# Patient Record
Sex: Female | Born: 1960 | Race: White | Hispanic: No | Marital: Married | Smoking: Never smoker
Health system: Southern US, Community
[De-identification: ages and names within clinical notes are randomized; demographics above are authoritative.]

## PROBLEM LIST (undated history)

## (undated) DIAGNOSIS — Z9889 Other specified postprocedural states: Secondary | ICD-10-CM

## (undated) DIAGNOSIS — R112 Nausea with vomiting, unspecified: Secondary | ICD-10-CM

## (undated) DIAGNOSIS — E0590 Thyrotoxicosis, unspecified without thyrotoxic crisis or storm: Secondary | ICD-10-CM

## (undated) DIAGNOSIS — F419 Anxiety disorder, unspecified: Secondary | ICD-10-CM

## (undated) DIAGNOSIS — E010 Iodine-deficiency related diffuse (endemic) goiter: Secondary | ICD-10-CM

## (undated) DIAGNOSIS — C50919 Malignant neoplasm of unspecified site of unspecified female breast: Secondary | ICD-10-CM

## (undated) DIAGNOSIS — Z87442 Personal history of urinary calculi: Secondary | ICD-10-CM

## (undated) HISTORY — PX: URETERAL EXPLORATION: SHX2609

## (undated) HISTORY — PX: ABDOMINAL WALL DEFECT REPAIR: SHX53

## (undated) HISTORY — PX: BOWEL RESECTION: SHX1257

## (undated) HISTORY — PX: ABDOMINOPLASTY: SUR9

## (undated) HISTORY — PX: APPENDECTOMY: SHX54

## (undated) HISTORY — PX: HERNIA REPAIR: SHX51

## (undated) HISTORY — PX: DILATION AND CURETTAGE OF UTERUS: SHX78

## (undated) HISTORY — PX: ROOT CANAL: SHX2363

---
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
## 2002-10-05 ENCOUNTER — Encounter: Admission: RE | Admit: 2002-10-05 | Discharge: 2002-10-05 | Payer: Self-pay | Admitting: Internal Medicine

## 2016-03-28 DIAGNOSIS — E0590 Thyrotoxicosis, unspecified without thyrotoxic crisis or storm: Secondary | ICD-10-CM | POA: Insufficient documentation

## 2017-03-04 DIAGNOSIS — Z30432 Encounter for removal of intrauterine contraceptive device: Secondary | ICD-10-CM | POA: Diagnosis not present

## 2017-04-25 DIAGNOSIS — E010 Iodine-deficiency related diffuse (endemic) goiter: Secondary | ICD-10-CM | POA: Diagnosis not present

## 2017-04-25 DIAGNOSIS — E0590 Thyrotoxicosis, unspecified without thyrotoxic crisis or storm: Secondary | ICD-10-CM | POA: Diagnosis not present

## 2017-04-25 DIAGNOSIS — Z23 Encounter for immunization: Secondary | ICD-10-CM | POA: Diagnosis not present

## 2017-05-02 DIAGNOSIS — Z803 Family history of malignant neoplasm of breast: Secondary | ICD-10-CM | POA: Diagnosis not present

## 2017-05-02 DIAGNOSIS — Z1231 Encounter for screening mammogram for malignant neoplasm of breast: Secondary | ICD-10-CM | POA: Diagnosis not present

## 2017-07-04 ENCOUNTER — Other Ambulatory Visit: Payer: Self-pay | Admitting: Radiology

## 2017-07-04 DIAGNOSIS — N6320 Unspecified lump in the left breast, unspecified quadrant: Secondary | ICD-10-CM

## 2017-07-09 ENCOUNTER — Ambulatory Visit
Admission: RE | Admit: 2017-07-09 | Discharge: 2017-07-09 | Disposition: A | Discharge status: Home / Self Care | Payer: BLUE CROSS/BLUE SHIELD | Source: Ambulatory Visit | Attending: Radiology | Admitting: Radiology

## 2017-07-09 DIAGNOSIS — N6320 Unspecified lump in the left breast, unspecified quadrant: Secondary | ICD-10-CM

## 2017-07-09 DIAGNOSIS — R922 Inconclusive mammogram: Secondary | ICD-10-CM | POA: Diagnosis not present

## 2017-07-09 DIAGNOSIS — N6489 Other specified disorders of breast: Secondary | ICD-10-CM | POA: Diagnosis not present

## 2017-09-30 DIAGNOSIS — B354 Tinea corporis: Secondary | ICD-10-CM | POA: Diagnosis not present

## 2017-10-29 DIAGNOSIS — E0590 Thyrotoxicosis, unspecified without thyrotoxic crisis or storm: Secondary | ICD-10-CM | POA: Diagnosis not present

## 2017-11-04 DIAGNOSIS — Z6822 Body mass index (BMI) 22.0-22.9, adult: Secondary | ICD-10-CM | POA: Diagnosis not present

## 2017-11-04 DIAGNOSIS — Z113 Encounter for screening for infections with a predominantly sexual mode of transmission: Secondary | ICD-10-CM | POA: Diagnosis not present

## 2017-11-04 DIAGNOSIS — Z01419 Encounter for gynecological examination (general) (routine) without abnormal findings: Secondary | ICD-10-CM | POA: Diagnosis not present

## 2017-11-12 DIAGNOSIS — Z1159 Encounter for screening for other viral diseases: Secondary | ICD-10-CM | POA: Diagnosis not present

## 2017-11-12 DIAGNOSIS — Z13228 Encounter for screening for other metabolic disorders: Secondary | ICD-10-CM | POA: Diagnosis not present

## 2017-11-25 DIAGNOSIS — Z0000 Encounter for general adult medical examination without abnormal findings: Secondary | ICD-10-CM | POA: Diagnosis not present

## 2017-11-25 DIAGNOSIS — E785 Hyperlipidemia, unspecified: Secondary | ICD-10-CM | POA: Diagnosis not present

## 2017-11-25 DIAGNOSIS — Z23 Encounter for immunization: Secondary | ICD-10-CM | POA: Diagnosis not present

## 2018-04-14 ENCOUNTER — Other Ambulatory Visit: Payer: Self-pay | Admitting: Endocrinology

## 2018-04-14 DIAGNOSIS — E010 Iodine-deficiency related diffuse (endemic) goiter: Secondary | ICD-10-CM

## 2018-04-17 ENCOUNTER — Ambulatory Visit
Admission: RE | Admit: 2018-04-17 | Discharge: 2018-04-17 | Disposition: A | Discharge status: Home / Self Care | Payer: PRIVATE HEALTH INSURANCE | Source: Ambulatory Visit | Attending: Endocrinology | Admitting: Endocrinology

## 2018-04-17 DIAGNOSIS — E010 Iodine-deficiency related diffuse (endemic) goiter: Secondary | ICD-10-CM

## 2018-05-02 ENCOUNTER — Other Ambulatory Visit: Payer: Self-pay | Admitting: Endocrinology

## 2018-05-02 DIAGNOSIS — E010 Iodine-deficiency related diffuse (endemic) goiter: Secondary | ICD-10-CM

## 2018-05-29 ENCOUNTER — Ambulatory Visit
Admission: RE | Admit: 2018-05-29 | Discharge: 2018-05-29 | Disposition: A | Discharge status: Home / Self Care | Payer: PRIVATE HEALTH INSURANCE | Source: Ambulatory Visit | Attending: Endocrinology | Admitting: Endocrinology

## 2018-05-29 ENCOUNTER — Other Ambulatory Visit (HOSPITAL_COMMUNITY)
Admission: RE | Admit: 2018-05-29 | Discharge: 2018-05-29 | Disposition: A | Discharge status: Home / Self Care | Payer: PRIVATE HEALTH INSURANCE | Source: Ambulatory Visit | Attending: Radiology | Admitting: Radiology

## 2018-05-29 DIAGNOSIS — E010 Iodine-deficiency related diffuse (endemic) goiter: Secondary | ICD-10-CM | POA: Diagnosis present

## 2018-08-13 ENCOUNTER — Other Ambulatory Visit: Payer: Self-pay | Admitting: Obstetrics and Gynecology

## 2018-08-13 DIAGNOSIS — Z1231 Encounter for screening mammogram for malignant neoplasm of breast: Secondary | ICD-10-CM

## 2018-08-21 ENCOUNTER — Ambulatory Visit
Admission: RE | Admit: 2018-08-21 | Discharge: 2018-08-21 | Disposition: A | Discharge status: Home / Self Care | Payer: BLUE CROSS/BLUE SHIELD | Source: Ambulatory Visit | Attending: Obstetrics and Gynecology | Admitting: Obstetrics and Gynecology

## 2018-08-21 DIAGNOSIS — Z1231 Encounter for screening mammogram for malignant neoplasm of breast: Secondary | ICD-10-CM

## 2018-08-22 DIAGNOSIS — E049 Nontoxic goiter, unspecified: Secondary | ICD-10-CM | POA: Diagnosis not present

## 2018-08-22 DIAGNOSIS — E785 Hyperlipidemia, unspecified: Secondary | ICD-10-CM | POA: Diagnosis not present

## 2018-08-22 DIAGNOSIS — E041 Nontoxic single thyroid nodule: Secondary | ICD-10-CM | POA: Diagnosis not present

## 2018-12-08 DIAGNOSIS — E041 Nontoxic single thyroid nodule: Secondary | ICD-10-CM | POA: Diagnosis not present

## 2018-12-08 DIAGNOSIS — Z1389 Encounter for screening for other disorder: Secondary | ICD-10-CM | POA: Diagnosis not present

## 2018-12-08 DIAGNOSIS — E049 Nontoxic goiter, unspecified: Secondary | ICD-10-CM | POA: Diagnosis not present

## 2018-12-08 DIAGNOSIS — E785 Hyperlipidemia, unspecified: Secondary | ICD-10-CM | POA: Diagnosis not present

## 2018-12-08 DIAGNOSIS — Z23 Encounter for immunization: Secondary | ICD-10-CM | POA: Diagnosis not present

## 2018-12-08 DIAGNOSIS — J309 Allergic rhinitis, unspecified: Secondary | ICD-10-CM | POA: Diagnosis not present

## 2018-12-08 DIAGNOSIS — M779 Enthesopathy, unspecified: Secondary | ICD-10-CM | POA: Diagnosis not present

## 2018-12-08 DIAGNOSIS — Z0000 Encounter for general adult medical examination without abnormal findings: Secondary | ICD-10-CM | POA: Diagnosis not present

## 2019-01-08 DIAGNOSIS — Z23 Encounter for immunization: Secondary | ICD-10-CM | POA: Diagnosis not present

## 2019-02-03 DIAGNOSIS — M25512 Pain in left shoulder: Secondary | ICD-10-CM | POA: Diagnosis not present

## 2019-02-03 DIAGNOSIS — M542 Cervicalgia: Secondary | ICD-10-CM | POA: Diagnosis not present

## 2019-03-11 DIAGNOSIS — Z23 Encounter for immunization: Secondary | ICD-10-CM | POA: Diagnosis not present

## 2019-04-16 ENCOUNTER — Other Ambulatory Visit: Payer: Self-pay | Admitting: Endocrinology

## 2019-04-16 DIAGNOSIS — E010 Iodine-deficiency related diffuse (endemic) goiter: Secondary | ICD-10-CM

## 2019-04-22 ENCOUNTER — Ambulatory Visit
Admission: RE | Admit: 2019-04-22 | Discharge: 2019-04-22 | Disposition: A | Discharge status: Home / Self Care | Payer: BC Managed Care – PPO | Source: Ambulatory Visit | Attending: Endocrinology | Admitting: Endocrinology

## 2019-04-22 DIAGNOSIS — E042 Nontoxic multinodular goiter: Secondary | ICD-10-CM | POA: Diagnosis not present

## 2019-04-22 DIAGNOSIS — E010 Iodine-deficiency related diffuse (endemic) goiter: Secondary | ICD-10-CM

## 2019-05-01 DIAGNOSIS — E0590 Thyrotoxicosis, unspecified without thyrotoxic crisis or storm: Secondary | ICD-10-CM | POA: Diagnosis not present

## 2019-05-01 DIAGNOSIS — R1310 Dysphagia, unspecified: Secondary | ICD-10-CM | POA: Diagnosis not present

## 2019-05-01 DIAGNOSIS — E010 Iodine-deficiency related diffuse (endemic) goiter: Secondary | ICD-10-CM | POA: Diagnosis not present

## 2019-06-04 ENCOUNTER — Other Ambulatory Visit: Payer: Self-pay | Admitting: Surgery

## 2019-06-04 DIAGNOSIS — E049 Nontoxic goiter, unspecified: Secondary | ICD-10-CM

## 2019-06-04 DIAGNOSIS — E042 Nontoxic multinodular goiter: Secondary | ICD-10-CM

## 2019-06-29 DIAGNOSIS — M7502 Adhesive capsulitis of left shoulder: Secondary | ICD-10-CM | POA: Diagnosis not present

## 2019-06-29 DIAGNOSIS — M25512 Pain in left shoulder: Secondary | ICD-10-CM | POA: Diagnosis not present

## 2019-07-10 DIAGNOSIS — Z23 Encounter for immunization: Secondary | ICD-10-CM | POA: Diagnosis not present

## 2019-07-15 ENCOUNTER — Ambulatory Visit: Payer: BC Managed Care – PPO | Attending: Internal Medicine

## 2019-07-15 ENCOUNTER — Other Ambulatory Visit: Payer: Self-pay

## 2019-07-15 DIAGNOSIS — Z23 Encounter for immunization: Secondary | ICD-10-CM | POA: Insufficient documentation

## 2019-07-23 DIAGNOSIS — M940 Chondrocostal junction syndrome [Tietze]: Secondary | ICD-10-CM | POA: Diagnosis not present

## 2019-07-23 DIAGNOSIS — E042 Nontoxic multinodular goiter: Secondary | ICD-10-CM | POA: Diagnosis not present

## 2019-07-25 ENCOUNTER — Ambulatory Visit: Payer: BC Managed Care – PPO

## 2019-08-01 ENCOUNTER — Ambulatory Visit: Payer: BC Managed Care – PPO | Attending: Internal Medicine

## 2019-08-01 DIAGNOSIS — Z23 Encounter for immunization: Secondary | ICD-10-CM

## 2019-08-04 ENCOUNTER — Other Ambulatory Visit: Payer: Self-pay | Admitting: Obstetrics and Gynecology

## 2019-08-04 DIAGNOSIS — Z1231 Encounter for screening mammogram for malignant neoplasm of breast: Secondary | ICD-10-CM

## 2019-08-05 ENCOUNTER — Ambulatory Visit: Payer: BC Managed Care – PPO

## 2019-08-26 ENCOUNTER — Ambulatory Visit: Payer: BC Managed Care – PPO | Attending: Internal Medicine

## 2019-08-26 DIAGNOSIS — Z23 Encounter for immunization: Secondary | ICD-10-CM

## 2019-08-26 NOTE — Progress Notes (Signed)
   Covid-19 Vaccination Clinic  Name:  Teresa Sheppard    MRN: [id number] DOB: August 25, 1960  08/26/2019  [name] was observed post Covid-19 immunization for 15 minutes without incident. She was provided with Vaccine Information Sheet and instruction to access the V-Safe system.   [name] was instructed to call 911 with any severe reactions post vaccine: Marland Kitchen Difficulty breathing  . Swelling of face and throat  . A fast heartbeat  . A bad rash all over body  . Dizziness and weakness   Immunizations Administered    Name Date Dose VIS Date Route   Pfizer COVID-19 Vaccine 08/26/2019 10:00 AM 0.3 mL 06/05/2019 Intramuscular   Manufacturer: [place]   Lot: [id number]   [place]: [id number]

## 2019-09-09 DIAGNOSIS — E785 Hyperlipidemia, unspecified: Secondary | ICD-10-CM | POA: Diagnosis not present

## 2019-09-09 DIAGNOSIS — E559 Vitamin D deficiency, unspecified: Secondary | ICD-10-CM | POA: Diagnosis not present

## 2019-09-09 DIAGNOSIS — Z0000 Encounter for general adult medical examination without abnormal findings: Secondary | ICD-10-CM | POA: Diagnosis not present

## 2019-09-09 DIAGNOSIS — Z1389 Encounter for screening for other disorder: Secondary | ICD-10-CM | POA: Diagnosis not present

## 2019-09-09 DIAGNOSIS — E041 Nontoxic single thyroid nodule: Secondary | ICD-10-CM | POA: Diagnosis not present

## 2019-09-14 ENCOUNTER — Ambulatory Visit
Admission: RE | Admit: 2019-09-14 | Discharge: 2019-09-14 | Disposition: A | Discharge status: Home / Self Care | Payer: BC Managed Care – PPO | Source: Ambulatory Visit | Attending: Obstetrics and Gynecology | Admitting: Obstetrics and Gynecology

## 2019-09-14 ENCOUNTER — Other Ambulatory Visit: Payer: Self-pay

## 2019-09-14 DIAGNOSIS — Z1231 Encounter for screening mammogram for malignant neoplasm of breast: Secondary | ICD-10-CM

## 2019-11-30 DIAGNOSIS — L814 Other melanin hyperpigmentation: Secondary | ICD-10-CM | POA: Diagnosis not present

## 2019-11-30 DIAGNOSIS — D485 Neoplasm of uncertain behavior of skin: Secondary | ICD-10-CM | POA: Diagnosis not present

## 2019-11-30 DIAGNOSIS — L821 Other seborrheic keratosis: Secondary | ICD-10-CM | POA: Diagnosis not present

## 2019-11-30 DIAGNOSIS — D1801 Hemangioma of skin and subcutaneous tissue: Secondary | ICD-10-CM | POA: Diagnosis not present

## 2019-11-30 DIAGNOSIS — D224 Melanocytic nevi of scalp and neck: Secondary | ICD-10-CM | POA: Diagnosis not present

## 2019-12-17 DIAGNOSIS — Z20828 Contact with and (suspected) exposure to other viral communicable diseases: Secondary | ICD-10-CM | POA: Diagnosis not present

## 2020-02-19 ENCOUNTER — Ambulatory Visit
Admission: RE | Admit: 2020-02-19 | Discharge: 2020-02-19 | Disposition: A | Discharge status: Home / Self Care | Payer: BC Managed Care – PPO | Source: Ambulatory Visit | Attending: Internal Medicine | Admitting: Internal Medicine

## 2020-02-19 ENCOUNTER — Other Ambulatory Visit: Payer: Self-pay | Admitting: Internal Medicine

## 2020-02-19 DIAGNOSIS — Z23 Encounter for immunization: Secondary | ICD-10-CM | POA: Diagnosis not present

## 2020-02-19 DIAGNOSIS — M79671 Pain in right foot: Secondary | ICD-10-CM

## 2020-02-19 DIAGNOSIS — S99921A Unspecified injury of right foot, initial encounter: Secondary | ICD-10-CM | POA: Diagnosis not present

## 2020-02-19 DIAGNOSIS — M7731 Calcaneal spur, right foot: Secondary | ICD-10-CM | POA: Diagnosis not present

## 2020-02-28 DIAGNOSIS — Z20828 Contact with and (suspected) exposure to other viral communicable diseases: Secondary | ICD-10-CM | POA: Diagnosis not present

## 2020-03-01 ENCOUNTER — Other Ambulatory Visit: Payer: Self-pay | Admitting: Endocrinology

## 2020-03-01 DIAGNOSIS — E010 Iodine-deficiency related diffuse (endemic) goiter: Secondary | ICD-10-CM | POA: Diagnosis not present

## 2020-03-01 DIAGNOSIS — E0590 Thyrotoxicosis, unspecified without thyrotoxic crisis or storm: Secondary | ICD-10-CM | POA: Diagnosis not present

## 2020-03-02 ENCOUNTER — Ambulatory Visit
Admission: RE | Admit: 2020-03-02 | Discharge: 2020-03-02 | Disposition: A | Discharge status: Home / Self Care | Payer: BC Managed Care – PPO | Source: Ambulatory Visit | Attending: Endocrinology | Admitting: Endocrinology

## 2020-03-02 DIAGNOSIS — E041 Nontoxic single thyroid nodule: Secondary | ICD-10-CM | POA: Diagnosis not present

## 2020-03-02 DIAGNOSIS — E010 Iodine-deficiency related diffuse (endemic) goiter: Secondary | ICD-10-CM

## 2020-03-04 ENCOUNTER — Other Ambulatory Visit: Payer: Self-pay | Admitting: Endocrinology

## 2020-03-04 DIAGNOSIS — E010 Iodine-deficiency related diffuse (endemic) goiter: Secondary | ICD-10-CM

## 2020-03-25 DIAGNOSIS — Z23 Encounter for immunization: Secondary | ICD-10-CM | POA: Diagnosis not present

## 2020-03-26 DIAGNOSIS — Z20828 Contact with and (suspected) exposure to other viral communicable diseases: Secondary | ICD-10-CM | POA: Diagnosis not present

## 2020-03-30 ENCOUNTER — Other Ambulatory Visit: Payer: BC Managed Care – PPO

## 2020-03-31 DIAGNOSIS — Z20828 Contact with and (suspected) exposure to other viral communicable diseases: Secondary | ICD-10-CM | POA: Diagnosis not present

## 2020-04-07 ENCOUNTER — Other Ambulatory Visit: Payer: Self-pay | Admitting: Endocrinology

## 2020-04-07 DIAGNOSIS — E010 Iodine-deficiency related diffuse (endemic) goiter: Secondary | ICD-10-CM

## 2020-04-09 DIAGNOSIS — Z20822 Contact with and (suspected) exposure to covid-19: Secondary | ICD-10-CM | POA: Diagnosis not present

## 2020-04-14 DIAGNOSIS — Z03818 Encounter for observation for suspected exposure to other biological agents ruled out: Secondary | ICD-10-CM | POA: Diagnosis not present

## 2020-04-19 ENCOUNTER — Ambulatory Visit
Admission: RE | Admit: 2020-04-19 | Discharge: 2020-04-19 | Disposition: A | Discharge status: Home / Self Care | Payer: BC Managed Care – PPO | Source: Ambulatory Visit | Attending: Endocrinology | Admitting: Endocrinology

## 2020-04-19 ENCOUNTER — Other Ambulatory Visit (HOSPITAL_COMMUNITY)
Admission: RE | Admit: 2020-04-19 | Discharge: 2020-04-19 | Disposition: A | Discharge status: Home / Self Care | Payer: BC Managed Care – PPO | Source: Ambulatory Visit | Attending: Radiology | Admitting: Radiology

## 2020-04-19 DIAGNOSIS — E010 Iodine-deficiency related diffuse (endemic) goiter: Secondary | ICD-10-CM

## 2020-04-19 DIAGNOSIS — E041 Nontoxic single thyroid nodule: Secondary | ICD-10-CM | POA: Diagnosis not present

## 2020-04-19 DIAGNOSIS — D34 Benign neoplasm of thyroid gland: Secondary | ICD-10-CM | POA: Insufficient documentation

## 2020-04-20 LAB — CYTOLOGY - NON PAP

## 2020-04-24 DIAGNOSIS — Z20822 Contact with and (suspected) exposure to covid-19: Secondary | ICD-10-CM | POA: Diagnosis not present

## 2020-04-24 DIAGNOSIS — Z03818 Encounter for observation for suspected exposure to other biological agents ruled out: Secondary | ICD-10-CM | POA: Diagnosis not present

## 2020-04-27 DIAGNOSIS — R1310 Dysphagia, unspecified: Secondary | ICD-10-CM | POA: Diagnosis not present

## 2020-04-27 DIAGNOSIS — E0590 Thyrotoxicosis, unspecified without thyrotoxic crisis or storm: Secondary | ICD-10-CM | POA: Diagnosis not present

## 2020-04-27 DIAGNOSIS — E010 Iodine-deficiency related diffuse (endemic) goiter: Secondary | ICD-10-CM | POA: Diagnosis not present

## 2020-06-02 ENCOUNTER — Other Ambulatory Visit: Payer: Self-pay | Admitting: Radiology

## 2020-06-02 DIAGNOSIS — N6310 Unspecified lump in the right breast, unspecified quadrant: Secondary | ICD-10-CM

## 2020-06-06 ENCOUNTER — Other Ambulatory Visit: Payer: Self-pay

## 2020-06-06 ENCOUNTER — Other Ambulatory Visit: Payer: Self-pay | Admitting: Radiology

## 2020-06-06 ENCOUNTER — Ambulatory Visit
Admission: RE | Admit: 2020-06-06 | Discharge: 2020-06-06 | Disposition: A | Discharge status: Home / Self Care | Payer: BC Managed Care – PPO | Source: Ambulatory Visit | Attending: Radiology | Admitting: Radiology

## 2020-06-06 DIAGNOSIS — N6310 Unspecified lump in the right breast, unspecified quadrant: Secondary | ICD-10-CM

## 2020-06-06 DIAGNOSIS — R922 Inconclusive mammogram: Secondary | ICD-10-CM | POA: Diagnosis not present

## 2020-06-06 DIAGNOSIS — N6313 Unspecified lump in the right breast, lower outer quadrant: Secondary | ICD-10-CM | POA: Diagnosis not present

## 2020-06-06 DIAGNOSIS — R2231 Localized swelling, mass and lump, right upper limb: Secondary | ICD-10-CM

## 2020-06-07 ENCOUNTER — Other Ambulatory Visit: Payer: Self-pay | Admitting: Radiology

## 2020-06-07 DIAGNOSIS — R2231 Localized swelling, mass and lump, right upper limb: Secondary | ICD-10-CM

## 2020-06-08 ENCOUNTER — Other Ambulatory Visit: Payer: BC Managed Care – PPO

## 2020-06-15 ENCOUNTER — Other Ambulatory Visit: Payer: Self-pay | Admitting: Radiology

## 2020-06-15 ENCOUNTER — Ambulatory Visit
Admission: RE | Admit: 2020-06-15 | Discharge: 2020-06-15 | Disposition: A | Discharge status: Home / Self Care | Payer: BC Managed Care – PPO | Source: Ambulatory Visit | Attending: Radiology | Admitting: Radiology

## 2020-06-15 ENCOUNTER — Other Ambulatory Visit: Payer: Self-pay

## 2020-06-15 DIAGNOSIS — R2231 Localized swelling, mass and lump, right upper limb: Secondary | ICD-10-CM

## 2020-06-15 DIAGNOSIS — N6311 Unspecified lump in the right breast, upper outer quadrant: Secondary | ICD-10-CM | POA: Diagnosis not present

## 2020-06-15 DIAGNOSIS — C50511 Malignant neoplasm of lower-outer quadrant of right female breast: Secondary | ICD-10-CM | POA: Diagnosis not present

## 2020-06-15 DIAGNOSIS — N6310 Unspecified lump in the right breast, unspecified quadrant: Secondary | ICD-10-CM

## 2020-06-15 DIAGNOSIS — C50811 Malignant neoplasm of overlapping sites of right female breast: Secondary | ICD-10-CM | POA: Diagnosis not present

## 2020-06-15 DIAGNOSIS — R922 Inconclusive mammogram: Secondary | ICD-10-CM | POA: Diagnosis not present

## 2020-06-15 DIAGNOSIS — N6313 Unspecified lump in the right breast, lower outer quadrant: Secondary | ICD-10-CM | POA: Diagnosis not present

## 2020-06-15 DIAGNOSIS — R590 Localized enlarged lymph nodes: Secondary | ICD-10-CM | POA: Diagnosis not present

## 2020-06-15 DIAGNOSIS — C773 Secondary and unspecified malignant neoplasm of axilla and upper limb lymph nodes: Secondary | ICD-10-CM | POA: Diagnosis not present

## 2020-06-21 ENCOUNTER — Encounter: Payer: Self-pay | Admitting: *Deleted

## 2020-06-21 ENCOUNTER — Telehealth: Payer: Self-pay | Admitting: Hematology and Oncology

## 2020-06-21 DIAGNOSIS — Z170 Estrogen receptor positive status [ER+]: Secondary | ICD-10-CM | POA: Insufficient documentation

## 2020-06-21 DIAGNOSIS — C50811 Malignant neoplasm of overlapping sites of right female breast: Secondary | ICD-10-CM | POA: Insufficient documentation

## 2020-06-21 NOTE — Telephone Encounter (Signed)
Spoke with patient to confirm afternoon [facility] appointment, sent packet through email

## 2020-06-22 DIAGNOSIS — C50919 Malignant neoplasm of unspecified site of unspecified female breast: Secondary | ICD-10-CM | POA: Insufficient documentation

## 2020-06-26 DIAGNOSIS — Z20822 Contact with and (suspected) exposure to covid-19: Secondary | ICD-10-CM | POA: Diagnosis not present

## 2020-06-29 ENCOUNTER — Other Ambulatory Visit: Payer: Self-pay

## 2020-06-29 ENCOUNTER — Encounter: Payer: Self-pay | Admitting: *Deleted

## 2020-06-29 ENCOUNTER — Encounter: Payer: Self-pay | Admitting: Radiation Oncology

## 2020-06-29 ENCOUNTER — Encounter: Payer: BC Managed Care – PPO | Admitting: Genetic Counselor

## 2020-06-29 ENCOUNTER — Ambulatory Visit
Admission: RE | Admit: 2020-06-29 | Discharge: 2020-06-29 | Disposition: A | Discharge status: Home / Self Care | Payer: BC Managed Care – PPO | Source: Ambulatory Visit | Attending: Radiation Oncology | Admitting: Radiation Oncology

## 2020-06-29 ENCOUNTER — Inpatient Hospital Stay: Payer: BC Managed Care – PPO

## 2020-06-29 ENCOUNTER — Ambulatory Visit: Payer: BC Managed Care – PPO | Admitting: Physical Therapy

## 2020-06-29 ENCOUNTER — Inpatient Hospital Stay: Payer: BC Managed Care – PPO | Attending: Hematology and Oncology | Admitting: Hematology and Oncology

## 2020-06-29 VITALS — BP 129/59 | HR 67 | Temp 98.2°F | Resp 18 | Ht 68.0 in | Wt 148.7 lb

## 2020-06-29 DIAGNOSIS — Z5189 Encounter for other specified aftercare: Secondary | ICD-10-CM | POA: Diagnosis not present

## 2020-06-29 DIAGNOSIS — K5900 Constipation, unspecified: Secondary | ICD-10-CM | POA: Diagnosis not present

## 2020-06-29 DIAGNOSIS — Z170 Estrogen receptor positive status [ER+]: Secondary | ICD-10-CM

## 2020-06-29 DIAGNOSIS — C773 Secondary and unspecified malignant neoplasm of axilla and upper limb lymph nodes: Secondary | ICD-10-CM | POA: Diagnosis not present

## 2020-06-29 DIAGNOSIS — C50811 Malignant neoplasm of overlapping sites of right female breast: Secondary | ICD-10-CM

## 2020-06-29 DIAGNOSIS — Z5111 Encounter for antineoplastic chemotherapy: Secondary | ICD-10-CM | POA: Diagnosis not present

## 2020-06-29 DIAGNOSIS — Z808 Family history of malignant neoplasm of other organs or systems: Secondary | ICD-10-CM | POA: Diagnosis not present

## 2020-06-29 DIAGNOSIS — Z5112 Encounter for antineoplastic immunotherapy: Secondary | ICD-10-CM | POA: Diagnosis not present

## 2020-06-29 DIAGNOSIS — C50911 Malignant neoplasm of unspecified site of right female breast: Secondary | ICD-10-CM | POA: Diagnosis not present

## 2020-06-29 HISTORY — DX: Iodine-deficiency related diffuse (endemic) goiter: E01.0

## 2020-06-29 LAB — CBC WITH DIFFERENTIAL (CANCER CENTER ONLY)
Abs Immature Granulocytes: 0.01 10*3/uL (ref 0.00–0.07)
Basophils Absolute: 0.1 10*3/uL (ref 0.0–0.1)
Basophils Relative: 1 %
Eosinophils Absolute: 0.1 10*3/uL (ref 0.0–0.5)
Eosinophils Relative: 2 %
HCT: 39.5 % (ref 36.0–46.0)
Hemoglobin: 13 g/dL (ref 12.0–15.0)
Immature Granulocytes: 0 %
Lymphocytes Relative: 37 %
Lymphs Abs: 1.7 10*3/uL (ref 0.7–4.0)
MCH: 30.6 pg (ref 26.0–34.0)
MCHC: 32.9 g/dL (ref 30.0–36.0)
MCV: 92.9 fL (ref 80.0–100.0)
Monocytes Absolute: 0.4 10*3/uL (ref 0.1–1.0)
Monocytes Relative: 9 %
Neutro Abs: 2.2 10*3/uL (ref 1.7–7.7)
Neutrophils Relative %: 51 %
Platelet Count: 195 10*3/uL (ref 150–400)
RBC: 4.25 MIL/uL (ref 3.87–5.11)
RDW: 11.5 % (ref 11.5–15.5)
WBC Count: 4.4 10*3/uL (ref 4.0–10.5)
nRBC: 0 % (ref 0.0–0.2)

## 2020-06-29 LAB — GENETIC SCREENING ORDER

## 2020-06-29 LAB — CMP (CANCER CENTER ONLY)
ALT: 15 U/L (ref 0–44)
AST: 21 U/L (ref 15–41)
Albumin: 4.1 g/dL (ref 3.5–5.0)
Alkaline Phosphatase: 58 U/L (ref 38–126)
Anion gap: 8 (ref 5–15)
BUN: 14 mg/dL (ref 6–20)
CO2: 27 mmol/L (ref 22–32)
Calcium: 9.7 mg/dL (ref 8.9–10.3)
Chloride: 106 mmol/L (ref 98–111)
Creatinine: 0.87 mg/dL (ref 0.44–1.00)
GFR, Estimated: >60 mL/min (ref >60)
Glucose, Bld: 99 mg/dL (ref 70–99)
Potassium: 4.4 mmol/L (ref 3.5–5.1)
Sodium: 141 mmol/L (ref 135–145)
Total Bilirubin: 0.5 mg/dL (ref 0.3–1.2)
Total Protein: 7.5 g/dL (ref 6.5–8.1)

## 2020-06-29 MED ORDER — LIDOCAINE-PRILOCAINE 2.5-2.5 % EX CREA: TOPICAL_CREAM | CUTANEOUS | 3 refills | Status: DC

## 2020-06-29 MED ORDER — PROCHLORPERAZINE MALEATE 10 MG PO TABS: 10.0000 mg | ORAL_TABLET | Freq: Four times a day (QID) | ORAL | 1 refills | Status: DC | PRN

## 2020-06-29 MED ORDER — DEXAMETHASONE 4 MG PO TABS: 4.0000 mg | ORAL_TABLET | Freq: Two times a day (BID) | ORAL | 0 refills | Status: DC

## 2020-06-29 MED ORDER — ONDANSETRON HCL 8 MG PO TABS: 8.0000 mg | ORAL_TABLET | Freq: Two times a day (BID) | ORAL | 1 refills | Status: DC | PRN

## 2020-06-29 NOTE — Progress Notes (Signed)
Radiation Oncology         [phone] ________________________________  Name: Teresa Sheppard        MRN: [id number]  Date of Service: 06/29/2020 DOB: [birth year]  [id number], Elzie Rings, MD  [name], MD     REFERRING PHYSICIAN: [name], MD   DIAGNOSIS: The encounter diagnosis was Malignant neoplasm of overlapping sites of right breast in female, estrogen receptor positive ([place]).   HISTORY OF PRESENT ILLNESS: Teresa Sheppard is a 60 y.o. female seen in the multidisciplinary breast clinic for a new diagnosis of right breast cancer. The patient was noted to have a palpable mass in the right breast for approximately a week, she has a strong family history of breast cancer, and was seen for diagnostic work-up.  This revealed confluent masses within the breast, by ultrasound at 7:00 a confluent group of disease was noted to measure 2.3 cm and another separate area of confluence was seen at 9 o'clock position measuring 2.2 cm.  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  She is seen today to discuss treatment recommendations of her cancer.    PREVIOUS RADIATION THERAPY: No   PAST MEDICAL HISTORY:  Past Medical History:  Diagnosis Date  . Thyromegaly        PAST SURGICAL HISTORY: Past Surgical History:  Procedure Laterality Date  . ABDOMINAL WALL DEFECT REPAIR     following hernia repair in 2010  . APPENDECTOMY    . BOWEL RESECTION     2008  while pregnant with last child  . CESAREAN SECTION    . [place] OF UTERUS     2000 and 2014  . ROOT CANAL     2019  . URETERAL EXPLORATION     dilataion for congenitally small ureter     FAMILY HISTORY:  Family History  Problem Relation Age of Onset  . Breast cancer Mother 36  . Breast  cancer Sister 61  . Breast cancer Maternal Aunt      SOCIAL HISTORY:   The patient is married and is accompanied by her husband. 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   ALLERGIES: Penicillins   MEDICATIONS:  Current Outpatient Medications  Medication Sig Dispense Refill  . Acetaminophen (TYLENOL) 325 MG CAPS Tylenol  prn    . Cholecalciferol (VITAMIN D) 50 MCG (2000 UT) tablet 1 tablet    . ibuprofen (ADVIL) 200 MG tablet ibuprofen  prn    . Omega-3 Fatty Acids (FISH OIL) 1000 MG CAPS Take 1 capsule by mouth. 4 days a week    . Probiotic CHEW See admin instructions.     No current facility-administered medications for this encounter.     REVIEW OF SYSTEMS: On review of systems, the patient reports that she is doing well overall. She does describe a tightness and discomfort with exercising along the left breast region occasionally accompanied by shortness of breath. She feels that this may also be a manifestation of anxiety. She denies symptoms currently. No other complaints are verbalized.     PHYSICAL EXAM:  Wt Readings from Last 3 Encounters:  06/29/20 148 lb 11.2 oz (67.4 kg)   Temp Readings  from Last 3 Encounters:  06/29/20 98.2 F (36.8 C) (Tympanic)   BP Readings from Last 3 Encounters:  06/29/20 (!) 129/59   Pulse Readings from Last 3 Encounters:  06/29/20 67    In general this is a well appearing caucasian female in no acute distress. She's alert and oriented x4 and appropriate throughout the examination. Cardiopulmonary assessment is negative for acute distress and she exhibits normal effort. Bilateral breast exam is deferred.    ECOG = 1  0 - Asymptomatic (Fully active, able to carry on all predisease activities without restriction)  1 - Symptomatic but completely ambulatory (Restricted in physically strenuous activity but ambulatory and able to carry out  work of a light or sedentary nature. For example, light housework, office work)  2 - Symptomatic, <50% in bed during the day (Ambulatory and capable of all self care but unable to carry out any work activities. Up and about more than 50% of waking hours)  3 - Symptomatic, >50% in bed, but not bedbound (Capable of only limited self-care, confined to bed or chair 50% or more of waking hours)  4 - Bedbound (Completely disabled. Cannot carry on any self-care. Totally confined to bed or chair)  5 - Death   Eustace Pen MM, Creech RH, Tormey DC, et al. [phone]). "Toxicity and response criteria of the [facility]". [place] Oncol. 5 (6): 649-55    LABORATORY DATA:  Lab Results  Component Value Date   WBC 4.4 06/29/2020   HGB 13.0 06/29/2020   HCT 39.5 06/29/2020   MCV 92.9 06/29/2020   PLT 195 06/29/2020   Lab Results  Component Value Date   NA 141 06/29/2020   K 4.4 06/29/2020   CL 106 06/29/2020   CO2 27 06/29/2020   Lab Results  Component Value Date   ALT 15 06/29/2020   AST 21 06/29/2020   ALKPHOS 58 06/29/2020   BILITOT 0.5 06/29/2020      RADIOGRAPHY: US BREAST LTD UNI RIGHT INC AXILLA  Result Date: 06/07/2020 CLINICAL DATA:  Palpable mass in the RIGHT breast for 1 week. Patient's sister underwent bilateral mastectomy in July or August of this year. Family history of breast cancer in patient's mother at 40, sister at 24, and maternal aunt. EXAM: DIGITAL DIAGNOSTIC RIGHT MAMMOGRAM WITH CAD AND TOMO ULTRASOUND RIGHT BREAST COMPARISON:  Previous exam(s). ACR Breast Density Category c: The breast tissue is heterogeneously dense, which may obscure small masses. FINDINGS: There has been interval development of asymmetry in the LOWER OUTER QUADRANT of the RIGHT breast, the region marked as palpable with BB. Spot tangential view of this region shows multiple rounded partially obscured masses corresponding to the asymmetry and palpable finding. No associated  distortion or microcalcifications. Mammographic images were processed with CAD. On physical exam, I palpate discrete area of firmness in the 7 o'clock location of the RIGHT breast 3 centimeters from the nipple. Targeted ultrasound is performed, showing multiple confluent masses in the 7 o'clock location of the RIGHT breast 3 centimeters from the nipple measuring 1.4 x 1.4 x 2.3 centimeters corresponding to the area of patient's concern. In the 9 o'clock location 2 centimeters from the nipple, a similar area of confluent masses is less discretely palpable and measures 1.8 x 1.3 x 2.2 centimeters. There is associated internal blood flow on Doppler evaluation. In the RIGHT axilla, there is a single lymph node with focal cortical thickening of 6 millimeters. IMPRESSION: 1. Two areas of concern in the RIGHT breast,  in the 7 o'clock and 9 o'clock locations. 2. Enlarged RIGHT axillary lymph node. RECOMMENDATION: 1. Recommend ultrasound-guided core biopsy of mass in the 7 o'clock location of the RIGHT breast. 2. Recommend ultrasound-guided core biopsy of mass in the 9 o'clock location of the RIGHT breast. 3. Recommend ultrasound-guided core biopsy of enlarged RIGHT axillary lymph node. 4. Given the suspicious findings in the RIGHT breast, recommend LEFT diagnostic mammogram when the patient returns for biopsy. I have discussed the findings and recommendations with the patient. If applicable, a reminder letter will be sent to the patient regarding the next appointment. BI-RADS CATEGORY  4: Suspicious. Electronically Signed   By: Nolon Nations M.D.   On: 06/07/2020 12:04   MM DIAG BREAST TOMO UNI LEFT  Result Date: 06/15/2020 CLINICAL DATA:  60 year old female status post 3 area ultrasound-guided biopsy of the right breast presenting for diagnostic evaluation of the left breast. EXAM: DIGITAL DIAGNOSTIC UNILATERAL LEFT MAMMOGRAM WITH TOMO AND CAD COMPARISON:  Previous exam(s). ACR Breast Density Category c: The breast  tissue is heterogeneously dense, which may obscure small masses. FINDINGS: No suspicious mammographic findings are identified in the left breast. The parenchymal pattern is stable. Mammographic images were processed with CAD. IMPRESSION: No mammographic evidence of malignancy. RECOMMENDATION: A final recommendation will be made when pathology for the patient's right breast biopsies are available. I have discussed the findings and recommendations with the patient. If applicable, a reminder letter will be sent to the patient regarding the next appointment. BI-RADS CATEGORY  1: Negative. Electronically Signed   By: Kristopher Oppenheim M.D.   On: 06/15/2020 12:02   MM DIAG BREAST TOMO UNI RIGHT  Result Date: 06/07/2020 CLINICAL DATA:  Palpable mass in the RIGHT breast for 1 week. Patient's sister underwent bilateral mastectomy in July or August of this year. Family history of breast cancer in patient's mother at 67, sister at 70, and maternal aunt. EXAM: DIGITAL DIAGNOSTIC RIGHT MAMMOGRAM WITH CAD AND TOMO ULTRASOUND RIGHT BREAST COMPARISON:  Previous exam(s). ACR Breast Density Category c: The breast tissue is heterogeneously dense, which may obscure small masses. FINDINGS: There has been interval development of asymmetry in the LOWER OUTER QUADRANT of the RIGHT breast, the region marked as palpable with BB. Spot tangential view of this region shows multiple rounded partially obscured masses corresponding to the asymmetry and palpable finding. No associated distortion or microcalcifications. Mammographic images were processed with CAD. On physical exam, I palpate discrete area of firmness in the 7 o'clock location of the RIGHT breast 3 centimeters from the nipple. Targeted ultrasound is performed, showing multiple confluent masses in the 7 o'clock location of the RIGHT breast 3 centimeters from the nipple measuring 1.4 x 1.4 x 2.3 centimeters corresponding to the area of patient's concern. In the 9 o'clock location 2  centimeters from the nipple, a similar area of confluent masses is less discretely palpable and measures 1.8 x 1.3 x 2.2 centimeters. There is associated internal blood flow on Doppler evaluation. In the RIGHT axilla, there is a single lymph node with focal cortical thickening of 6 millimeters. IMPRESSION: 1. Two areas of concern in the RIGHT breast, in the 7 o'clock and 9 o'clock locations. 2. Enlarged RIGHT axillary lymph node. RECOMMENDATION: 1. Recommend ultrasound-guided core biopsy of mass in the 7 o'clock location of the RIGHT breast. 2. Recommend ultrasound-guided core biopsy of mass in the 9 o'clock location of the RIGHT breast. 3. Recommend ultrasound-guided core biopsy of enlarged RIGHT axillary lymph node. 4. Given the suspicious  findings in the RIGHT breast, recommend LEFT diagnostic mammogram when the patient returns for biopsy. I have discussed the findings and recommendations with the patient. If applicable, a reminder letter will be sent to the patient regarding the next appointment. BI-RADS CATEGORY  4: Suspicious. Electronically Signed   By: Nolon Nations M.D.   On: 06/07/2020 12:04   [place] AXILLARY NODE CORE BIOPSY RIGHT  Addendum Date: 06/23/2020   ADDENDUM REPORT: 06/16/2020 13:07 ADDENDUM: Pathology revealed GRADE II/III INVASIVE MAMMARY CARCINOMA of the RIGHT breast, 7 o'clock, 3cmfn (ribbon clip). This was found to be concordant by Dr. Kristopher Oppenheim. Pathology revealed GRADE II/III INVASIVE MAMMARY CARCINOMA, LYMPHOVASCULAR INVASION PRESENT of the RIGHT breast, 9 o'clock, 2cmfn (coil clip). This was found to be concordant by Dr. Kristopher Oppenheim. 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 This was found to be concordant by Dr. Kristopher Oppenheim. Pathology results were discussed with the patient by telephone. The patient reported doing well after the biopsies with tenderness at the sites. Post biopsy instructions and care were reviewed and  questions were answered. The patient was encouraged to call [facility] [place] for any additional concerns. The patient was referred to [facility] on June 29, 2020. Recommend breast MRI given patients breast density and multifocality of disease. Pathology results reported by Stacie Acres RN on 06/16/2020. Electronically Signed   By: Kristopher Oppenheim M.D.   On: 06/16/2020 13:07   Result Date: 06/23/2020 CLINICAL DATA:  60 year old female with 2 suspicious right breast masses and right axillary lymph node. EXAM: ULTRASOUND GUIDED RIGHT BREAST CORE NEEDLE BIOPSY x3 COMPARISON:  Previous exam(s). PROCEDURE: I met with the patient and we discussed the procedure of ultrasound-guided biopsy, including benefits and alternatives. We discussed the high likelihood of a successful procedure. We discussed the risks of the procedure, including infection, bleeding, tissue injury, clip migration, and inadequate sampling. Informed written consent was given. The usual time-out protocol was performed immediately prior to the procedure. Lesion quadrant: Lower outer quadrant Using sterile technique and 1% Lidocaine as local anesthetic, under direct ultrasound visualization, a 14 gauge spring-loaded device was used to perform biopsy of a mass at the 7 o'clock position 3 cm from the nipple using a lateral approach. At the conclusion of the procedure a ribbon shaped tissue marker clip was deployed into the biopsy cavity. Lesion quadrant: Upper outer quadrant Using sterile technique and 1% Lidocaine as local anesthetic, under direct ultrasound visualization, a 14 gauge spring-loaded device was used to perform biopsy of a mass at the 9 o'clock position 2 cm from the nipple using a lateral approach. At the conclusion of the procedure a coil shaped tissue marker clip was deployed into the biopsy cavity. Using sterile technique and 1% Lidocaine  as local anesthetic, under direct ultrasound visualization, a 14 gauge spring-loaded device was used to perform biopsy of a right axillary lymph node using a inferior approach. At the conclusion of the procedure a try bell tissue marker clip was deployed into the biopsy cavity. Follow up 2 view mammogram was performed and dictated separately. IMPRESSION: Ultrasound guided biopsy of 2 sites in the right breast and a right axillary lymph node. No apparent complications. Electronically Signed: By: Kristopher Oppenheim M.D. On: 06/15/2020 11:52   MM CLIP PLACEMENT RIGHT  Result Date: 06/15/2020 CLINICAL DATA:  60 year old female status post 3 area ultrasound-guided biopsy of the right breast and  axilla. EXAM: DIAGNOSTIC RIGHT MAMMOGRAM POST ULTRASOUND BIOPSY COMPARISON:  Previous exam(s). FINDINGS: Mammographic images were obtained following ultrasound guided biopsies of 2 right breast masses and a right axillary lymph node. The ribbon and coil shaped clips are identified in the expected position in the lateral right breast. A tribell clip is noted in the right axilla. IMPRESSION: Appropriate positioning of all 3 post biopsy marking clips in the right breast/axilla. Final Assessment: Post Procedure Mammograms for Marker Placement Electronically Signed   By: Kristopher Oppenheim M.D.   On: 06/15/2020 12:04   [place] RT BREAST BX W LOC DEV 1ST LESION IMG BX SPEC US GUIDE  Addendum Date: 06/23/2020   ADDENDUM REPORT: 06/16/2020 13:07 ADDENDUM: Pathology revealed GRADE II/III INVASIVE MAMMARY CARCINOMA of the RIGHT breast, 7 o'clock, 3cmfn (ribbon clip). This was found to be concordant by Dr. Kristopher Oppenheim. Pathology revealed GRADE II/III INVASIVE MAMMARY CARCINOMA, LYMPHOVASCULAR INVASION PRESENT of the RIGHT breast, 9 o'clock, 2cmfn (coil clip). This was found to be concordant by Dr. Kristopher Oppenheim. 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 This was found to be concordant by Dr.  Kristopher Oppenheim. Pathology results were discussed with the patient by telephone. The patient reported doing well after the biopsies with tenderness at the sites. Post biopsy instructions and care were reviewed and questions were answered. The patient was encouraged to call [facility] for any additional concerns. The patient was referred to [facility] on June 29, 2020. Recommend breast MRI given patients breast density and multifocality of disease. Pathology results reported by Stacie Acres RN on 06/16/2020. Electronically Signed   By: Kristopher Oppenheim M.D.   On: 06/16/2020 13:07   Result Date: 06/23/2020 CLINICAL DATA:  60 year old female with 2 suspicious right breast masses and right axillary lymph node. EXAM: ULTRASOUND GUIDED RIGHT BREAST CORE NEEDLE BIOPSY x3 COMPARISON:  Previous exam(s). PROCEDURE: I met with the patient and we discussed the procedure of ultrasound-guided biopsy, including benefits and alternatives. We discussed the high likelihood of a successful procedure. We discussed the risks of the procedure, including infection, bleeding, tissue injury, clip migration, and inadequate sampling. Informed written consent was given. The usual time-out protocol was performed immediately prior to the procedure. Lesion quadrant: Lower outer quadrant Using sterile technique and 1% Lidocaine as local anesthetic, under direct ultrasound visualization, a 14 gauge spring-loaded device was used to perform biopsy of a mass at the 7 o'clock position 3 cm from the nipple using a lateral approach. At the conclusion of the procedure a ribbon shaped tissue marker clip was deployed into the biopsy cavity. Lesion quadrant: Upper outer quadrant Using sterile technique and 1% Lidocaine as local anesthetic, under direct ultrasound visualization, a 14 gauge spring-loaded device was used to perform biopsy of a mass at the 9  o'clock position 2 cm from the nipple using a lateral approach. At the conclusion of the procedure a coil shaped tissue marker clip was deployed into the biopsy cavity. Using sterile technique and 1% Lidocaine as local anesthetic, under direct ultrasound visualization, a 14 gauge spring-loaded device was used to perform biopsy of a right axillary lymph node using a inferior approach. At the conclusion of the procedure a try bell tissue marker clip was deployed into the biopsy cavity. Follow up 2 view mammogram was performed and dictated separately. IMPRESSION: Ultrasound guided biopsy of 2 sites in the right breast and a  right axillary lymph node. No apparent complications. Electronically Signed: By: Kristopher Oppenheim M.D. On: 06/15/2020 11:52   [place] RT BREAST BX W LOC DEV EA ADD LESION IMG BX SPEC US GUIDE  Addendum Date: 06/23/2020   ADDENDUM REPORT: 06/16/2020 13:07 ADDENDUM: Pathology revealed GRADE II/III INVASIVE MAMMARY CARCINOMA of the RIGHT breast, 7 o'clock, 3cmfn (ribbon clip). This was found to be concordant by Dr. Kristopher Oppenheim. Pathology revealed GRADE II/III INVASIVE MAMMARY CARCINOMA, LYMPHOVASCULAR INVASION PRESENT of the RIGHT breast, 9 o'clock, 2cmfn (coil clip). This was found to be concordant by Dr. Kristopher Oppenheim. 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 This was found to be concordant by Dr. Kristopher Oppenheim. Pathology results were discussed with the patient by telephone. The patient reported doing well after the biopsies with tenderness at the sites. Post biopsy instructions and care were reviewed and questions were answered. The patient was encouraged to call [facility] for any additional concerns. The patient was referred to [facility] on June 29, 2020. Recommend breast MRI given patients breast density and multifocality of disease.  Pathology results reported by Stacie Acres RN on 06/16/2020. Electronically Signed   By: Kristopher Oppenheim M.D.   On: 06/16/2020 13:07   Result Date: 06/23/2020 CLINICAL DATA:  60 year old female with 2 suspicious right breast masses and right axillary lymph node. EXAM: ULTRASOUND GUIDED RIGHT BREAST CORE NEEDLE BIOPSY x3 COMPARISON:  Previous exam(s). PROCEDURE: I met with the patient and we discussed the procedure of ultrasound-guided biopsy, including benefits and alternatives. We discussed the high likelihood of a successful procedure. We discussed the risks of the procedure, including infection, bleeding, tissue injury, clip migration, and inadequate sampling. Informed written consent was given. The usual time-out protocol was performed immediately prior to the procedure. Lesion quadrant: Lower outer quadrant Using sterile technique and 1% Lidocaine as local anesthetic, under direct ultrasound visualization, a 14 gauge spring-loaded device was used to perform biopsy of a mass at the 7 o'clock position 3 cm from the nipple using a lateral approach. At the conclusion of the procedure a ribbon shaped tissue marker clip was deployed into the biopsy cavity. Lesion quadrant: Upper outer quadrant Using sterile technique and 1% Lidocaine as local anesthetic, under direct ultrasound visualization, a 14 gauge spring-loaded device was used to perform biopsy of a mass at the 9 o'clock position 2 cm from the nipple using a lateral approach. At the conclusion of the procedure a coil shaped tissue marker clip was deployed into the biopsy cavity. Using sterile technique and 1% Lidocaine as local anesthetic, under direct ultrasound visualization, a 14 gauge spring-loaded device was used to perform biopsy of a right axillary lymph node using a inferior approach. At the conclusion of the procedure a try bell tissue marker clip was deployed into the biopsy cavity. Follow up 2 view mammogram was performed and dictated separately.  IMPRESSION: Ultrasound guided biopsy of 2 sites in the right breast and a right axillary lymph node. No apparent complications. Electronically Signed: By: Kristopher Oppenheim M.D. On: 06/15/2020 11:52       IMPRESSION/PLAN: 1. 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 Dr. Lisbeth Renshaw discusses the pathology findings and reviews the nature of right node positive breast disease. The consensus from the breast conference includes proceeding with neoadjuvant chemotherapy with surgery to be determined.  Dr. Lisbeth Renshaw reviewed the  rationale for external radiotherapy to the breast and regional nodes  to reduce risks of local recurrence followed by antiestrogen therapy. We discussed the risks, benefits, short, and long term effects of radiotherapy, as well as the curative intent, and the patient is interested in proceeding. Dr. Lisbeth Renshaw discusses the delivery and logistics of radiotherapy and anticipates a course of 6 1/2 weeks of radiotherapy to the breast and regional nodes. We will see her back a few weeks after surgery to discuss the simulation process and anticipate we starting radiotherapy about 4-6 weeks after surgery.  2. Possible genetic predisposition to malignancy. The patient is a candidate for genetic testing given her personal and family history. She was offered referral and is interested in thinking about her options after discussing with her sister who had cancer but tested negative.   In a visit lasting 120 minutes, greater than 50% of the time was spent face to face reviewing her case, as well as in preparation of, discussing, and coordinating the patient's care.  The above documentation reflects my direct findings during this shared patient visit. Please see the separate note by Dr. Lisbeth Renshaw on this date for the remainder of the patient's plan of care.    Carola Rhine, PAC

## 2020-06-29 NOTE — Progress Notes (Signed)
[facility] [facility] NOTE  Patient Care Team: Marylynn Pearson, MD as PCP - General (Obstetrics and Gynecology) Rockwell Germany, RN as Oncology Nurse Navigator Tressie Ellis, Paulette Blanch, RN as Oncology Nurse Navigator [name], MD as Consulting Physician (General Surgery) Nicholas Lose, MD as Consulting Physician (Hematology and Oncology) Kyung Rudd, MD as Consulting Physician (Radiation Oncology)  CHIEF COMPLAINTS/PURPOSE OF CONSULTATION:  Newly diagnosed breast cancer  HISTORY OF PRESENTING ILLNESS:  Teresa Sheppard 60 y.o. female is here because of recent diagnosis of invasive mammary carcinoma of the right breast. Patient palpated a right breast mass for 1 week. Mammogram of the right breast on 06/06/20 showed multiple confluent masses in the right breast at the 7 o'clock position measuring 2.3cm and at the 9 o'clock position measuring 2.2cm with one right axillary lymph node with cortical thickening. Biopsy on 06/15/20 showed invasive mammary carcinoma at both positions and in the axilla, grade 2-3, HER-2 equivocal by IHC (2+), positive by FISH (ratio 3.34), ER+ >95%, PR+ 25%, Ki67 60%. She has a family history of breast cancer in her mother at 49yo, her sister at 26yo and her maternal aunt. She presents to the clinic today for initial evaluation and discussion of treatment options.   I reviewed her records extensively and collaborated the history with the patient.  SUMMARY OF ONCOLOGIC HISTORY: Oncology History  Malignant neoplasm of overlapping sites of right breast in female, estrogen receptor positive ([place])  06/21/2020 Initial Diagnosis   Patient palpated a right breast mass x 1 wk. Mammogram showed multiple confluent masses in the right breast at the 7 o'clock position measuring 2.3cm and at the 9 o'clock position measuring 2.2cm with one right axillary lymph node with cortical thickening. Biopsy showed invasive mammary carcinoma at both positions and in the  axilla, grade 2-3, HER-2 equivocal by IHC (2+), positive by FISH (ratio 3.34), ER+ >95%, PR+ 25%, Ki67 60%.      MEDICAL HISTORY:  Past Medical History:  Diagnosis Date  . Thyromegaly     SURGICAL HISTORY: Past Surgical History:  Procedure Laterality Date  . ABDOMINAL WALL DEFECT REPAIR     following hernia repair in 2010  . APPENDECTOMY    . BOWEL RESECTION     2008  while pregnant with last child  . CESAREAN SECTION    . [place] OF UTERUS     2000 and 2014  . ROOT CANAL     2019  . URETERAL EXPLORATION     dilataion for congenitally small ureter    SOCIAL HISTORY: Social History   Socioeconomic History  . Marital status: Married    Spouse name: Not on file  . Number of children: Not on file  . Years of education: Not on file  . Highest education level: Not on file  Occupational History  . Not on file  Tobacco Use  . Smoking status: Not on file  . Smokeless tobacco: Not on file  Substance and Sexual Activity  . Alcohol use: Not on file  . Drug use: Not on file  . Sexual activity: Not on file  Other Topics Concern  . Not on file  Social History Narrative  . Not on file   Social Determinants of Health   Financial Resource Strain: Not on file  Food Insecurity: Not on file  Transportation Needs: Not on file  Physical Activity: Not on file  Stress: Not on file  Social Connections: Not on file  Intimate Partner Violence: Not on  file    FAMILY HISTORY: Family History  Problem Relation Age of Onset  . Breast cancer Mother 31  . Breast cancer Sister 18  . Breast cancer Maternal Aunt     ALLERGIES:  is allergic to penicillins.  MEDICATIONS:  Current Outpatient Medications  Medication Sig Dispense Refill  . Acetaminophen (TYLENOL) 325 MG CAPS Tylenol  prn    . Cholecalciferol (VITAMIN D) 50 MCG (2000 UT) tablet 1 tablet    . ibuprofen (ADVIL) 200 MG tablet ibuprofen  prn    . Omega-3 Fatty Acids (FISH OIL) 1000 MG CAPS Take 1  capsule by mouth. 4 days a week    . Probiotic CHEW See admin instructions.     No current facility-administered medications for this visit.    REVIEW OF SYSTEMS:     All other systems were reviewed with the patient and are negative.  PHYSICAL EXAMINATION: ECOG PERFORMANCE STATUS: 1 - Symptomatic but completely ambulatory  Vitals:   06/29/20 1332  BP: (!) 129/59  Pulse: 67  Resp: 18  Temp: 98.2 F (36.8 C)  SpO2: 100%   Filed Weights   06/29/20 1332  Weight: 148 lb 11.2 oz (67.4 kg)       LABORATORY DATA:  I have reviewed the data as listed Lab Results  Component Value Date   WBC 4.4 06/29/2020   HGB 13.0 06/29/2020   HCT 39.5 06/29/2020   MCV 92.9 06/29/2020   PLT 195 06/29/2020   Lab Results  Component Value Date   NA 141 06/29/2020   K 4.4 06/29/2020   CL 106 06/29/2020   CO2 27 06/29/2020    RADIOGRAPHIC STUDIES: I have personally reviewed the radiological reports and agreed with the findings in the report.  ASSESSMENT AND PLAN:  Malignant neoplasm of overlapping sites of right breast in female, estrogen receptor positive ([place]) 06/21/2020:Patient palpated a right breast mass x 1 wk. Mammogram showed multiple confluent masses in the right breast at the 7 o'clock position measuring 2.3cm and at the 9 o'clock position measuring 2.2cm with one right axillary lymph node with cortical thickening. Biopsy showed invasive mammary carcinoma at both positions and in the axilla, grade 2-3, HER-2 equivocal by IHC (2+), positive by FISH (ratio 3.34), ER+ >95%, PR+ 25%, Ki67 60%.  Pathology and radiology counseling: Discussed with the patient, the details of pathology including the type of breast cancer,the clinical staging, the significance of ER, PR and HER-2/neu receptors and the implications for treatment. After reviewing the pathology in detail, we proceeded to discuss the different treatment options between surgery, radiation, chemotherapy, antiestrogen  therapies.  Recommendation based on multidisciplinary tumor board: 1. Neoadjuvant chemotherapy with TCH Perjeta 6 cycles followed by Herceptin Perjeta versus [name] for 1 year 2. Followed by breast conserving surgery if possible with sentinel lymph node study 3. Followed by adjuvant radiation therapy if patient had lumpectomy 4.  Followed by adjuvant antiestrogen therapy 5.  Followed by neratinib  Chemotherapy Counseling: I discussed the risks and benefits of chemotherapy including the risks of nausea/ vomiting, risk of infection from low WBC count, fatigue due to chemo or anemia, bruising or bleeding due to low platelets, mouth sores, loss/ change in taste and decreased appetite. Liver and kidney function will be monitored through out chemotherapy as abnormalities in liver and kidney function may be a side effect of treatment. Cardiac dysfunction due to Herceptin and Perjeta were discussed in detail. Risk of permanent bone marrow dysfunction due to chemo were also discussed.  Plan: 1. Port placement 2. Echocardiogram 3. Chemotherapy class 4. Breast MRI 5.  Genetic testing  Patient agreed to participate in the antinausea clinical trial [facility] 16070: Treatment of refractory nausea.  After first cycle of chemo if patient experience chemo induced nausea and vomiting the randomized from cycle 2 to Aloxi plus Dex plus olanzapine or placebo plus Compazine or placebo plus placebo prior to chemo and take home medications for day 2 today for of Dex plus olanzapine or placebo and Compazine or placebo every 8 hours.  If patient does not have nausea after cycle 1, then the trial is complete.  Return to clinic in 2 weeks to start chemotherapy.  All questions were answered. The patient knows to call the clinic with any problems, questions or concerns.   Rulon Eisenmenger, MD, MPH 06/29/2020    I, Molly Dorshimer, am acting as scribe for Nicholas Lose, MD.  I have reviewed the above  documentation for accuracy and completeness, and I agree with the above.

## 2020-06-29 NOTE — Assessment & Plan Note (Signed)
06/21/2020:Patient palpated a right breast mass x 1 wk. Mammogram showed multiple confluent masses in the right breast at the 7 o'clock position measuring 2.3cm and at the 9 o'clock position measuring 2.2cm with one right axillary lymph node with cortical thickening. Biopsy showed invasive mammary carcinoma at both positions and in the axilla, grade 2-3, HER-2 equivocal by IHC (2+), positive by FISH (ratio 3.34), ER+ >95%, PR+ 25%, Ki67 60%.  Pathology and radiology counseling: Discussed with the patient, the details of pathology including the type of breast cancer,the clinical staging, the significance of ER, PR and HER-2/neu receptors and the implications for treatment. After reviewing the pathology in detail, we proceeded to discuss the different treatment options between surgery, radiation, chemotherapy, antiestrogen therapies.  Recommendation based on multidisciplinary tumor board: 1. Neoadjuvant chemotherapy with TCH Perjeta 6 cycles followed by Herceptin Perjeta versus [name] for 1 year 2. Followed by breast conserving surgery if possible with sentinel lymph node study 3. Followed by adjuvant radiation therapy if patient had lumpectomy 4.  Followed by adjuvant antiestrogen therapy 5.  Followed by neratinib  Chemotherapy Counseling: I discussed the risks and benefits of chemotherapy including the risks of nausea/ vomiting, risk of infection from low WBC count, fatigue due to chemo or anemia, bruising or bleeding due to low platelets, mouth sores, loss/ change in taste and decreased appetite. Liver and kidney function will be monitored through out chemotherapy as abnormalities in liver and kidney function may be a side effect of treatment. Cardiac dysfunction due to Herceptin and Perjeta were discussed in detail. Risk of permanent bone marrow dysfunction due to chemo were also discussed.  Plan: 1. Port placement 2. Echocardiogram 3. Chemotherapy class 4. Breast MRI 5.  Genetic  testing  Return to clinic in 2 weeks to start chemotherapy.

## 2020-06-29 NOTE — Progress Notes (Signed)
START ON PATHWAY REGIMEN - Breast     A cycle is every 21 days:     Pertuzumab      Pertuzumab      Trastuzumab-xxxx      Trastuzumab-xxxx      Carboplatin      Docetaxel   **Always confirm dose/schedule in your pharmacy ordering system**  Patient Characteristics: Preoperative or Nonsurgical Candidate (Clinical Staging), Neoadjuvant Therapy followed by Surgery, Invasive Disease, Chemotherapy, HER2 Positive, ER Positive Therapeutic Status: Preoperative or Nonsurgical Candidate (Clinical Staging) AJCC M Category: cM0 AJCC Grade: G3 Breast Surgical Plan: Neoadjuvant Therapy followed by Surgery ER Status: Positive (+) AJCC 8 Stage Grouping: IB HER2 Status: Positive (+) AJCC T Category: cT2 AJCC N Category: cN1 PR Status: Positive (+) Intent of Therapy: Curative Intent, Discussed with Patient 

## 2020-06-30 ENCOUNTER — Telehealth: Payer: Self-pay | Admitting: Emergency Medicine

## 2020-06-30 ENCOUNTER — Encounter: Payer: Self-pay | Admitting: General Practice

## 2020-06-30 DIAGNOSIS — Z20822 Contact with and (suspected) exposure to covid-19: Secondary | ICD-10-CM | POA: Diagnosis not present

## 2020-06-30 NOTE — Telephone Encounter (Signed)
[id number] - TREATMENT OF REFRACTORY NAUSEA  06/30/20   Ellan Lambert Eid was called today in regards to a referral to the [facility] [address] research study by Dr. Pamelia Hoit.  The research study procedures were discussed with the patient.  The patient was advised that during the first cycle of chemotherapy her nausea would be monitored.  If she had significant nausea, she would proceed to the second part of the study which involved taking oral study medication.  The patient reported that she has difficulty swallowing medications due to a history of thyromegaly.  The patient stated that she would decline participation for this reason.  She was sent information regarding the study through e-mail, and was advised to call if she had any questions or if she changes her decision.  The patient was thanked for her time.  Lavonna Rua Clinical Research Coordinator I  06/30/20  4:01 PM

## 2020-06-30 NOTE — Progress Notes (Signed)
[employer] Psychosocial Distress Screening Spiritual Care  Met with Teresa Sheppard by phone following [facility] to introduce [place] team/resources, reviewing distress screen per protocol.  The patient scored a 6 on the Psychosocial Distress Thermometer which indicates severe distress. Also assessed for distress and other psychosocial needs.   ONCBCN DISTRESS SCREENING 06/30/2020  Screening Type Initial Screening  Distress experienced in past week (1-10) 6  Practical problem type Insurance  Emotional problem type Nervousness/Anxiety;Adjusting to illness  Information Concerns Type Lack of info about diagnosis;Lack of info about treatment;Lack of info about complementary therapy choices;Lack of info about maintaining fitness  Physical Problem type Loss of appetitie  Referral to support programs Yes   Teresa Sheppard reports that she has confidence in her team and has had her primary questions answered (and knows more info will come in chemo class).   Follow up needed: No. Per Teresa Sheppard, no other needs or concerns at this time, but she is aware of ongoing Spiritual Care availability should needs arise.   [place], [place], [facility] Pager [phone] Voicemail [phone]

## 2020-07-01 ENCOUNTER — Other Ambulatory Visit: Payer: Self-pay | Admitting: *Deleted

## 2020-07-01 ENCOUNTER — Telehealth: Payer: Self-pay | Admitting: Hematology and Oncology

## 2020-07-01 ENCOUNTER — Other Ambulatory Visit: Payer: Self-pay | Admitting: Hematology and Oncology

## 2020-07-01 MED ORDER — LORAZEPAM 0.5 MG PO TABS: ORAL_TABLET | ORAL | 0 refills | Status: DC

## 2020-07-01 NOTE — Telephone Encounter (Signed)
Scheduled per 1/5 los. Pt will receive an updated appt calendar per next visit appt notes  

## 2020-07-02 ENCOUNTER — Ambulatory Visit
Admission: RE | Admit: 2020-07-02 | Discharge: 2020-07-02 | Disposition: A | Discharge status: Home / Self Care | Payer: BC Managed Care – PPO | Source: Ambulatory Visit | Attending: Hematology and Oncology | Admitting: Hematology and Oncology

## 2020-07-02 ENCOUNTER — Other Ambulatory Visit: Payer: Self-pay

## 2020-07-02 DIAGNOSIS — Z170 Estrogen receptor positive status [ER+]: Secondary | ICD-10-CM

## 2020-07-02 DIAGNOSIS — C50811 Malignant neoplasm of overlapping sites of right female breast: Secondary | ICD-10-CM

## 2020-07-02 MED ORDER — GADOBUTROL 1 MMOL/ML IV SOLN
7.0000 mL | Freq: Once | INTRAVENOUS | Status: AC | PRN
  Administered 2020-07-02: 7 mL via INTRAVENOUS

## 2020-07-04 NOTE — Progress Notes (Signed)
The following biosimilar Udenyca ([id number]) has been selected for use in this patient.  Kennith Center, Pharm.D., CPP 07/04/2020@2 :51 PM

## 2020-07-04 NOTE — Progress Notes (Signed)
Pharmacist Chemotherapy Monitoring - Initial Assessment    Anticipated start date: 07/11/20  Regimen:  . Are orders appropriate based on the patient's diagnosis, regimen, and cycle? Yes . Does the plan date match the patient's scheduled date? Yes . Is the sequencing of drugs appropriate? Yes . Are the premedications appropriate for the patient's regimen? Yes . Prior Authorization for treatment is: Pending o If applicable, is the correct biosimilar selected based on the patient's insurance? yes  Organ Function and Labs: Marland Kitchen Are dose adjustments needed based on the patient's renal function, hepatic function, or hematologic function? Yes . Are appropriate labs ordered prior to the start of patient's treatment? Yes . Other organ system assessment, if indicated: trastuzumab: Echo/ MUGA . The following baseline labs, if indicated, have been ordered: N/A  Dose Assessment: . Are the drug doses appropriate? Yes . Are the following correct: o Drug concentrations Yes o IV fluid compatible with drug Yes o Administration routes Yes o Timing of therapy Yes . If applicable, does the patient have documented access for treatment and/or plans for port-a-cath placement? yes . If applicable, have lifetime cumulative doses been properly documented and assessed? not applicable Lifetime Dose Tracking  No doses have been documented on this patient for the following tracked chemicals: Doxorubicin, Epirubicin, Idarubicin, Daunorubicin, Mitoxantrone, Bleomycin, Oxaliplatin, Carboplatin, Liposomal Doxorubicin  o   Toxicity Monitoring/Prevention: . The patient has the following take home antiemetics prescribed: Prochlorperazine . The patient has the following take home medications prescribed: N/A . Medication allergies and previous infusion related reactions, if applicable, have been reviewed and addressed. Yes . The patient's current medication list has been assessed for drug-drug interactions with their  chemotherapy regimen. no significant drug-drug interactions were identified on review.  Order Review: . Are the treatment plan orders signed? Yes . Is the patient scheduled to see a provider prior to their treatment? No  I verify that I have reviewed each item in the above checklist and answered each question accordingly.  Teresa Sheppard 07/04/2020 12:04 PM

## 2020-07-05 ENCOUNTER — Ambulatory Visit (HOSPITAL_COMMUNITY)
Admission: RE | Admit: 2020-07-05 | Discharge: 2020-07-05 | Disposition: A | Discharge status: Home / Self Care | Payer: BC Managed Care – PPO | Source: Ambulatory Visit | Attending: Hematology and Oncology | Admitting: Hematology and Oncology

## 2020-07-05 ENCOUNTER — Other Ambulatory Visit: Payer: Self-pay | Admitting: General Surgery

## 2020-07-05 ENCOUNTER — Telehealth: Payer: Self-pay | Admitting: *Deleted

## 2020-07-05 ENCOUNTER — Other Ambulatory Visit: Payer: Self-pay | Admitting: Hematology and Oncology

## 2020-07-05 ENCOUNTER — Inpatient Hospital Stay: Payer: BC Managed Care – PPO

## 2020-07-05 ENCOUNTER — Other Ambulatory Visit: Payer: Self-pay

## 2020-07-05 ENCOUNTER — Encounter: Payer: Self-pay | Admitting: *Deleted

## 2020-07-05 DIAGNOSIS — C50811 Malignant neoplasm of overlapping sites of right female breast: Secondary | ICD-10-CM | POA: Insufficient documentation

## 2020-07-05 DIAGNOSIS — Z01818 Encounter for other preprocedural examination: Secondary | ICD-10-CM | POA: Insufficient documentation

## 2020-07-05 DIAGNOSIS — Z170 Estrogen receptor positive status [ER+]: Secondary | ICD-10-CM

## 2020-07-05 DIAGNOSIS — Z0189 Encounter for other specified special examinations: Secondary | ICD-10-CM

## 2020-07-05 NOTE — Progress Notes (Signed)
  Echocardiogram 2D Echocardiogram has been performed.  Teresa Sheppard 07/05/2020, 12:20 PM

## 2020-07-05 NOTE — Telephone Encounter (Signed)
Called and spoke with patient to follow up from [facility] and to discuss her MRI results and the need for a 2nd look u/s of the left breast mass. The breast center will call her with this appointment. Also informed her that we are working on getting her chemo r/s to 1/19 as her port is being placed 1/18.  Patient verbalized understanding.

## 2020-07-06 ENCOUNTER — Ambulatory Visit: Payer: BC Managed Care – PPO | Attending: General Surgery | Admitting: Physical Therapy

## 2020-07-06 ENCOUNTER — Other Ambulatory Visit: Payer: BC Managed Care – PPO

## 2020-07-06 ENCOUNTER — Encounter: Payer: Self-pay | Admitting: Physical Therapy

## 2020-07-06 DIAGNOSIS — Z170 Estrogen receptor positive status [ER+]: Secondary | ICD-10-CM | POA: Insufficient documentation

## 2020-07-06 DIAGNOSIS — R293 Abnormal posture: Secondary | ICD-10-CM | POA: Insufficient documentation

## 2020-07-06 DIAGNOSIS — C50511 Malignant neoplasm of lower-outer quadrant of right female breast: Secondary | ICD-10-CM | POA: Insufficient documentation

## 2020-07-06 LAB — ECHOCARDIOGRAM COMPLETE
Area-P 1/2: 3.12 cm2
S' Lateral: 2.5 cm

## 2020-07-06 NOTE — Therapy (Signed)
[place], [place], [address] Phone: [phone]   Fax:  [phone]  Physical Therapy Evaluation  Patient Details  Name: Teresa Sheppard MRN: [id number] Date of Birth: 1961/06/09 Referring Provider (PT): [name]   Encounter Date: 07/06/2020   PT End of Session - 07/06/20 1115    Visit Number 1    Number of Visits 2    Date for PT Re-Evaluation 01/03/21    PT Start Time 1008    PT Stop Time 1115    PT Time Calculation (min) 67 min    Activity Tolerance Patient tolerated treatment well    Behavior During Therapy [facility] for tasks assessed/performed           Past Medical History:  Diagnosis Date  . Thyromegaly     Past Surgical History:  Procedure Laterality Date  . ABDOMINAL WALL DEFECT REPAIR     following hernia repair in 2010  . APPENDECTOMY    . BOWEL RESECTION     2008  while pregnant with last child  . CESAREAN SECTION    . [place] OF UTERUS     2000 and 2014  . ROOT CANAL     2019  . URETERAL EXPLORATION     dilataion for congenitally small ureter    There were no vitals filed for this visit.    Subjective Assessment - 07/06/20 1010    Subjective I have energy during the day but a certain time at night I am tired. They are going to have to take a second look on the left because I have to have an MRI.    Pertinent History R breast cancer, left outer quadrant, triple positive, Ki 67 - 60%, grade 2 -3 IDC, they will not know what type of surgery until after neoadjuvant chemo, will need radiation    Patient Stated Goals to gain info from provider    Currently in Pain? No/denies    Multiple Pain Sites No              [facility] PT Assessment - 07/06/20 0001      Assessment   Medical Diagnosis R breast cancer    Referring Provider (PT) [name]    Onset Date/Surgical Date 06/16/20    Hand Dominance Right    Prior Therapy PT for partially frozen shoulder  over a year ago (L)      Precautions   Precautions Other (comment)    Precaution Comments active cancer      Restrictions   Weight Bearing Restrictions No      Balance Screen   Has the patient fallen in the past 6 months No    Has the patient had a decrease in activity level because of a fear of falling?  No    Is the patient reluctant to leave their home because of a fear of falling?  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    Leisure run/walk 2-3x/wk, light weight lifting, squats      Cognition   Overall Cognitive Status Within Functional Limits for tasks assessed      Posture/Postural Control   Posture/Postural Control Postural limitations    Postural Limitations Rounded Shoulders;Forward head      ROM / Strength   AROM / PROM / Strength AROM      AROM   AROM Assessment Site Shoulder    Right/Left Shoulder Right;Left    Right Shoulder Extension 59 Degrees    Right Shoulder Flexion 163 Degrees    Right Shoulder ABduction 179 Degrees    Right Shoulder Internal Rotation 71 Degrees    Right Shoulder External Rotation 85 Degrees    Left Shoulder Extension 64 Degrees    Left Shoulder Flexion 166 Degrees    Left Shoulder ABduction 176 Degrees    Left Shoulder Internal Rotation 70 Degrees    Left Shoulder External Rotation 84 Degrees             LYMPHEDEMA/ONCOLOGY QUESTIONNAIRE - 07/06/20 0001      Type   Cancer Type right breast  cancer      Lymphedema Assessments   Lymphedema Assessments Upper extremities      Right Upper Extremity Lymphedema   15 cm Proximal to Olecranon Process 27 cm    Olecranon Process 23 cm    15 cm Proximal to Ulnar Styloid Process 20 cm    Just Proximal to Ulnar Styloid Process 14.9 cm    Across Hand at [employer] 17 cm    At [place] of 2nd Digit 5.7 cm      Left Upper Extremity Lymphedema   15 cm Proximal to Olecranon Process 28.2 cm    Olecranon Process 23.7 cm    15 cm Proximal to Ulnar Styloid Process 20 cm    Just Proximal to Ulnar Styloid Process 14.9 cm    Across Hand at [employer] 17.5 cm    At [place] of 2nd Digit 5.8 cm           L-DEX FLOWSHEETS - 07/06/20 1100      L-DEX LYMPHEDEMA SCREENING   Measurement Type Unilateral    L-DEX MEASUREMENT EXTREMITY Upper Extremity    POSITION  Standing    DOMINANT SIDE Right    At Risk Side Right    BASELINE SCORE (UNILATERAL) -3.9           The patient was assessed using the L-Dex machine today to produce a lymphedema index baseline score. The patient will be reassessed on a regular basis (typically every 3 months) to obtain new L-Dex scores. If the score is > 6.5 points away from his/her baseline score indicating onset of subclinical lymphedema, it will be recommended to wear a compression garment for 4 weeks, 12 hours per day and then be reassessed. If the score continues to be > 6.5 points from baseline at reassessment, we will initiate lymphedema treatment. Assessing in this manner has a 95% rate of preventing clinically significant lymphedema.       Objective measurements completed on examination: See above findings.               PT Education - 07/06/20 1115    Education Details anatomy and physiology of lymphatic system, lymphedema risk reduction practices, ABC class, post op breast exercises    Person(s) Educated Patient;Spouse    Methods Explanation;Handout  Comprehension Verbalized  understanding               PT Long Term Goals - 07/06/20 1126      PT LONG TERM GOAL #1   Title Pt will return to baseline ROM measurements and will not demonstrate any signs of lymphedema.    Time 6    Period Months    Status New    Target Date 01/03/21           Breast Clinic Goals - 07/06/20 1125      Patient will be able to verbalize understanding of pertinent lymphedema risk reduction practices relevant to her diagnosis specifically related to skin care.   Time 1    Period Days    Status Achieved      Patient will be able to return demonstrate and/or verbalize understanding of the post-op home exercise program related to regaining shoulder range of motion.   Time 1    Period Days    Status Achieved      Patient will be able to verbalize understanding of the importance of attending the postoperative After Breast Cancer Class for further lymphedema risk reduction education and therapeutic exercise.   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 It is HER2+, ER+, PR+. She is beginning neoadjuvant chemotherapy and then will either undergo a lumpectomy or a mastectomy and then will undergo radiation. Pt and spouse were instructed in post op exercises and educated that if she has a mastectomy then not to begin exercises until a week after the last drain is removed. Baseline ROM and SOZO measurements taken today. She will benefit from a post op PT reassessment to determine needs and in every 3 months for additional L dex screening to detect subclinical lymphedema.    Stability/Clinical Decision Making Stable/Uncomplicated    Clinical Decision Making Low    Rehab Potential Good    PT Frequency Other (comment)   eval and 1 f/u visit   PT Duration Other (comment)   6 months - pt received neoadjuvant chemo 1st   PT  Treatment/Interventions ADLs/Self Care Home Management;Patient/family education;Therapeutic exercise    PT Next Visit Plan reassess baselines    PT Home Exercise Plan post op breast exercises    Consulted and Agree with Plan of Care Patient          Patient will follow up at outpatient cancer rehab 3-4 weeks following surgery.  If the patient requires physical therapy at that time, a specific plan will be dictated and sent to the referring physician for approval. The patient was educated today on appropriate basic range of motion exercises to begin post operatively and the importance of attending the After Breast Cancer class following surgery.  Patient was educated today on lymphedema risk reduction practices as it pertains to recommendations that will benefit the patient immediately following surgery.  She verbalized good understanding.    Patient will benefit from skilled therapeutic intervention in order to improve the following deficits and impairments:  Postural dysfunction,Decreased knowledge of precautions  Visit Diagnosis: Abnormal posture  Malignant neoplasm of lower-outer quadrant of right breast of female, estrogen receptor positive ([place])     Problem List Patient Active Problem List   Diagnosis Date Noted  .  Malignant neoplasm of overlapping sites of right breast in female, estrogen receptor positive ([place]) 06/21/2020    Allyson Sabal [facility] 07/06/2020, 11:27 AM  [place] [place], [place], [address] Phone: [phone]   Fax:  [phone]  Name: Shadara Lopez MRN: [id number] Date of Birth: February 14, 1961   Manus Gunning, PT 07/06/20 11:27 AM

## 2020-07-06 NOTE — Progress Notes (Signed)
Your procedure is scheduled on Tuesday, July 12, 2020 at 9:00 AM.  Report to [facility] Main Entrance "A" at 7:00 A.M., and check in at the Admitting office.  Call this number if you have problems the morning of surgery:  [phone]  Call [phone] if you have any questions prior to your surgery date Monday-Friday 8am-4pm    Remember:  Do not eat after midnight the night before your surgery  You may drink clear liquids until 6:00 AM the morning of your surgery.   Clear liquids allowed are: Water, Non-Citrus Juices (without pulp), Carbonated Beverages, Clear Tea, Black Coffee Only, and Gatorade  Please complete your PRE-SURGERY ENSURE that was provided to you by 6:00 AM the morning of surgery.  Please, if able, drink it in one setting. DO NOT SIP.     Take these medicines the morning of surgery with A SIP OF WATER:  IF NEEDED:  Acetaminophen (TYLENOL)  cetirizine (ZYRTEC) fluticasone (FLONASE) Polyethyl Glycol-Propyl Glycol (SYSTANE OP) prochlorperazine (COMPAZINE)   As of today, STOP taking any Aspirin (unless otherwise instructed by your surgeon) Aleve, Naproxen, Ibuprofen, Motrin, Advil, Goody's, BC's, all herbal medications, fish oil, and all vitamins.                      Do not wear jewelry, make up, or nail polish            Do not wear lotions, powders, perfumes, or deodorant.            Do not shave 48 hours prior to surgery.  Men may shave face and neck.            Do not bring valuables to the hospital.            [facility] is not responsible for any belongings or valuables.  Do NOT Smoke (Tobacco/Vaping) or drink Alcohol 24 hours prior to your procedure If you use a CPAP at night, you may bring all equipment for your overnight stay.   Contacts, glasses, dentures or bridgework may not be worn into surgery.      For patients admitted to the hospital, discharge time will be determined by your treatment team.   Patients discharged the day of surgery will  not be allowed to drive home, and someone needs to stay with them for 24 hours.    Special instructions:   [place]- Preparing For Surgery  Before surgery, you can play an important role. Because skin is not sterile, your skin needs to be as free of germs as possible. You can reduce the number of germs on your skin by washing with CHG (chlorahexidine gluconate) Soap before surgery.  CHG is an antiseptic cleaner which kills germs and bonds with the skin to continue killing germs even after washing.    Oral Hygiene is also important to reduce your risk of infection.  Remember - BRUSH YOUR TEETH THE MORNING OF SURGERY WITH YOUR REGULAR TOOTHPASTE  Please do not use if you have an allergy to CHG or antibacterial soaps. If your skin becomes reddened/irritated stop using the CHG.  Do not shave (including legs and underarms) for at least 48 hours prior to first CHG shower. It is OK to shave your face.  Please follow these instructions carefully.   1. Shower the NIGHT BEFORE SURGERY and the MORNING OF SURGERY with CHG Soap.   2. If you chose to wash your hair, wash your hair first as usual with your normal  shampoo.  3. After you shampoo, rinse your hair and body thoroughly to remove the shampoo.  4. Use CHG as you would any other liquid soap. You can apply CHG directly to the skin and wash gently with a scrungie or a clean washcloth.   5. Apply the CHG Soap to your body ONLY FROM THE NECK DOWN.  Do not use on open wounds or open sores. Avoid contact with your eyes, ears, mouth and genitals (private parts). Wash Face and genitals (private parts)  with your normal soap.   6. Wash thoroughly, paying special attention to the area where your surgery will be performed.  7. Thoroughly rinse your body with warm water from the neck down.  8. DO NOT shower/wash with your normal soap after using and rinsing off the CHG Soap.  9. Pat yourself dry with a CLEAN TOWEL.  10. Wear CLEAN [place] to bed  the night before surgery  11. Place CLEAN SHEETS on your bed the night of your first shower and DO NOT SLEEP WITH PETS.   Day of Surgery: Wear Clean/Comfortable clothing the morning of surgery Do not apply any deodorants/lotions.   Remember to brush your teeth WITH YOUR REGULAR TOOTHPASTE.   Please read over the following fact sheets that you were given.

## 2020-07-07 ENCOUNTER — Encounter (HOSPITAL_COMMUNITY): Payer: Self-pay

## 2020-07-07 ENCOUNTER — Other Ambulatory Visit: Payer: Self-pay

## 2020-07-07 ENCOUNTER — Encounter (HOSPITAL_COMMUNITY)
Admission: RE | Admit: 2020-07-07 | Discharge: 2020-07-07 | Disposition: A | Discharge status: Home / Self Care | Payer: BC Managed Care – PPO | Source: Ambulatory Visit | Attending: General Surgery | Admitting: General Surgery

## 2020-07-07 ENCOUNTER — Other Ambulatory Visit: Payer: Self-pay | Admitting: *Deleted

## 2020-07-07 DIAGNOSIS — Z01818 Encounter for other preprocedural examination: Secondary | ICD-10-CM | POA: Diagnosis not present

## 2020-07-07 HISTORY — DX: Anxiety disorder, unspecified: F41.9

## 2020-07-07 HISTORY — DX: Other specified postprocedural states: Z98.890

## 2020-07-07 HISTORY — DX: Other specified postprocedural states: R11.2

## 2020-07-07 HISTORY — DX: Malignant neoplasm of unspecified site of unspecified female breast: C50.919

## 2020-07-07 MED ORDER — LORAZEPAM 0.5 MG PO TABS: 0.5000 mg | ORAL_TABLET | Freq: Every day | ORAL | 1 refills | Status: DC

## 2020-07-07 NOTE — Progress Notes (Signed)
PCP - Dr. Tommie Ard  Cardiologist - Denies  Chest x-ray - Denies  EKG - Denies  Stress Test - Denies  ECHO - 07/05/20 (E)  Cardiac Cath - Denies  AICD-na PM-na LOOP-na  Sleep Study - Denies CPAP - Denies  LABS- 06/29/20 (E): CBC, CMP 07/08/20: COVID  ASA- Denies  ERAS- Yes- Ensure x1 given  HA1C- Denies  Anesthesia- No  Pt denies having chest pain, sob, or fever at this time. All instructions explained to the pt, with a verbal understanding of the material. Pt agrees to go over the instructions while at home for a better understanding. Pt also instructed to self quarantine after being tested for COVID-19. The opportunity to ask questions was provided.   Coronavirus Screening  Have you experienced the following symptoms:  Cough yes/no: No Fever (>100.5F)  yes/no: No Runny nose yes/no: No Sore throat yes/no: No Difficulty breathing/shortness of breath  yes/no: No  Have you or a family member traveled in the last 14 days and where? yes/no: No   If the patient indicates "YES" to the above questions, their PAT will be rescheduled to limit the exposure to others and, the surgeon will be notified. THE PATIENT WILL NEED TO BE ASYMPTOMATIC FOR 14 DAYS.   If the patient is not experiencing any of these symptoms, the PAT nurse will instruct them to NOT bring anyone with them to their appointment since they may have these symptoms or traveled as well.   Please remind your patients and families that hospital visitation restrictions are in effect and the importance of the restrictions.

## 2020-07-07 NOTE — Progress Notes (Signed)
Pharmacist Chemotherapy Monitoring - Initial Assessment    Anticipated start date: 07/13/20  Regimen:  . Are orders appropriate based on the patient's diagnosis, regimen, and cycle? Yes . Does the plan date match the patient's scheduled date? Yes . Is the sequencing of drugs appropriate? Yes . Are the premedications appropriate for the patient's regimen? Yes . Prior Authorization for treatment is: Approved o If applicable, is the correct biosimilar selected based on the patient's insurance? Yes, Udenyca  Organ Function and Labs: Marland Kitchen Are dose adjustments needed based on the patient's renal function, hepatic function, or hematologic function? No . Are appropriate labs ordered prior to the start of patient's treatment? Yes . Other organ system assessment, if indicated: trastuzumab: Echo/ MUGA and pertuzumab: Echo/ MUGA . The following baseline labs, if indicated, have been ordered: N/A  Dose Assessment: . Are the drug doses appropriate? Yes, 420 mg dose given cycle 1, day 1 for pertuzumab. Modified by [name] . Are the following correct: o Drug concentrations Yes o IV fluid compatible with drug Yes o Administration routes Yes o Timing of therapy Yes . If applicable, does the patient have documented access for treatment and/or plans for port-a-cath placement? yes . If applicable, have lifetime cumulative doses been properly documented and assessed? not applicable Lifetime Dose Tracking  No doses have been documented on this patient for the following tracked chemicals: Doxorubicin, Epirubicin, Idarubicin, Daunorubicin, Mitoxantrone, Bleomycin, Oxaliplatin, Carboplatin, Liposomal Doxorubicin  o   Toxicity Monitoring/Prevention: . The patient has the following take home antiemetics prescribed: Ondansetron, Prochlorperazine and Dexamethasone . The patient has the following take home medications prescribed: N/A . Medication allergies and previous infusion related reactions, if applicable,  have been reviewed and addressed. Yes . The patient's current medication list has been assessed for drug-drug interactions with their chemotherapy regimen. Yes, no significant interactions identified  Order Review: . Are the treatment plan orders signed? Yes . Is the patient scheduled to see a provider prior to their treatment? Yes  I verify that I have reviewed each item in the above checklist and answered each question accordingly.  Raina Mina, PharmD, BCOP 07/07/2020 12:39 PM

## 2020-07-08 ENCOUNTER — Other Ambulatory Visit (HOSPITAL_COMMUNITY): Payer: BC Managed Care – PPO

## 2020-07-08 ENCOUNTER — Ambulatory Visit
Admission: RE | Admit: 2020-07-08 | Discharge: 2020-07-08 | Disposition: A | Discharge status: Home / Self Care | Payer: BC Managed Care – PPO | Source: Ambulatory Visit | Attending: Hematology and Oncology | Admitting: Hematology and Oncology

## 2020-07-08 ENCOUNTER — Encounter: Payer: Self-pay | Admitting: *Deleted

## 2020-07-08 DIAGNOSIS — C50811 Malignant neoplasm of overlapping sites of right female breast: Secondary | ICD-10-CM

## 2020-07-08 DIAGNOSIS — Z170 Estrogen receptor positive status [ER+]: Secondary | ICD-10-CM

## 2020-07-08 DIAGNOSIS — N6489 Other specified disorders of breast: Secondary | ICD-10-CM | POA: Diagnosis not present

## 2020-07-09 ENCOUNTER — Other Ambulatory Visit (HOSPITAL_COMMUNITY)
Admission: RE | Admit: 2020-07-09 | Discharge: 2020-07-09 | Disposition: A | Discharge status: Home / Self Care | Payer: BC Managed Care – PPO | Source: Ambulatory Visit | Attending: General Surgery | Admitting: General Surgery

## 2020-07-09 DIAGNOSIS — Z01812 Encounter for preprocedural laboratory examination: Secondary | ICD-10-CM | POA: Insufficient documentation

## 2020-07-09 DIAGNOSIS — Z803 Family history of malignant neoplasm of breast: Secondary | ICD-10-CM | POA: Diagnosis not present

## 2020-07-09 DIAGNOSIS — Z20822 Contact with and (suspected) exposure to covid-19: Secondary | ICD-10-CM | POA: Insufficient documentation

## 2020-07-09 DIAGNOSIS — Z170 Estrogen receptor positive status [ER+]: Secondary | ICD-10-CM | POA: Diagnosis not present

## 2020-07-09 DIAGNOSIS — C50911 Malignant neoplasm of unspecified site of right female breast: Secondary | ICD-10-CM | POA: Diagnosis not present

## 2020-07-09 DIAGNOSIS — Z880 Allergy status to penicillin: Secondary | ICD-10-CM | POA: Diagnosis not present

## 2020-07-09 DIAGNOSIS — Z91013 Allergy to seafood: Secondary | ICD-10-CM | POA: Diagnosis not present

## 2020-07-09 DIAGNOSIS — C773 Secondary and unspecified malignant neoplasm of axilla and upper limb lymph nodes: Secondary | ICD-10-CM | POA: Diagnosis not present

## 2020-07-09 LAB — SARS CORONAVIRUS 2 (TAT 6-24 HRS): SARS Coronavirus 2: NEGATIVE

## 2020-07-11 ENCOUNTER — Ambulatory Visit: Payer: BC Managed Care – PPO

## 2020-07-11 ENCOUNTER — Other Ambulatory Visit: Payer: Self-pay | Admitting: *Deleted

## 2020-07-11 ENCOUNTER — Other Ambulatory Visit: Payer: BC Managed Care – PPO

## 2020-07-11 DIAGNOSIS — Z170 Estrogen receptor positive status [ER+]: Secondary | ICD-10-CM

## 2020-07-11 DIAGNOSIS — C50811 Malignant neoplasm of overlapping sites of right female breast: Secondary | ICD-10-CM

## 2020-07-12 ENCOUNTER — Encounter (HOSPITAL_COMMUNITY)
Admission: RE | Disposition: A | Discharge status: Home / Self Care | Payer: Self-pay | Source: Home / Self Care | Attending: General Surgery

## 2020-07-12 ENCOUNTER — Ambulatory Visit (HOSPITAL_COMMUNITY): Payer: BC Managed Care – PPO

## 2020-07-12 ENCOUNTER — Encounter: Payer: Self-pay | Admitting: *Deleted

## 2020-07-12 ENCOUNTER — Encounter (HOSPITAL_COMMUNITY): Payer: Self-pay | Admitting: General Surgery

## 2020-07-12 ENCOUNTER — Ambulatory Visit (HOSPITAL_COMMUNITY): Payer: BC Managed Care – PPO | Admitting: Certified Registered Nurse Anesthetist

## 2020-07-12 ENCOUNTER — Ambulatory Visit (HOSPITAL_COMMUNITY): Payer: BC Managed Care – PPO | Admitting: Vascular Surgery

## 2020-07-12 ENCOUNTER — Ambulatory Visit (HOSPITAL_COMMUNITY)
Admission: RE | Admit: 2020-07-12 | Discharge: 2020-07-12 | Disposition: A | Discharge status: Home / Self Care | Payer: BC Managed Care – PPO | Attending: General Surgery | Admitting: General Surgery

## 2020-07-12 ENCOUNTER — Telehealth: Payer: Self-pay | Admitting: *Deleted

## 2020-07-12 DIAGNOSIS — Z91013 Allergy to seafood: Secondary | ICD-10-CM | POA: Insufficient documentation

## 2020-07-12 DIAGNOSIS — C50911 Malignant neoplasm of unspecified site of right female breast: Secondary | ICD-10-CM | POA: Diagnosis not present

## 2020-07-12 DIAGNOSIS — Z880 Allergy status to penicillin: Secondary | ICD-10-CM | POA: Diagnosis not present

## 2020-07-12 DIAGNOSIS — Z452 Encounter for adjustment and management of vascular access device: Secondary | ICD-10-CM | POA: Diagnosis not present

## 2020-07-12 DIAGNOSIS — Z20822 Contact with and (suspected) exposure to covid-19: Secondary | ICD-10-CM | POA: Insufficient documentation

## 2020-07-12 DIAGNOSIS — Z803 Family history of malignant neoplasm of breast: Secondary | ICD-10-CM | POA: Diagnosis not present

## 2020-07-12 DIAGNOSIS — C773 Secondary and unspecified malignant neoplasm of axilla and upper limb lymph nodes: Secondary | ICD-10-CM | POA: Insufficient documentation

## 2020-07-12 DIAGNOSIS — Z95828 Presence of other vascular implants and grafts: Secondary | ICD-10-CM

## 2020-07-12 DIAGNOSIS — C50811 Malignant neoplasm of overlapping sites of right female breast: Secondary | ICD-10-CM | POA: Diagnosis not present

## 2020-07-12 DIAGNOSIS — Z170 Estrogen receptor positive status [ER+]: Secondary | ICD-10-CM | POA: Diagnosis not present

## 2020-07-12 DIAGNOSIS — J9811 Atelectasis: Secondary | ICD-10-CM | POA: Diagnosis not present

## 2020-07-12 DIAGNOSIS — Z419 Encounter for procedure for purposes other than remedying health state, unspecified: Secondary | ICD-10-CM

## 2020-07-12 HISTORY — PX: PORTACATH PLACEMENT: SHX2246

## 2020-07-12 LAB — COMPREHENSIVE METABOLIC PANEL
ALT: 14 U/L (ref 0–44)
AST: 20 U/L (ref 15–41)
Albumin: 3.3 g/dL — ABNORMAL LOW (ref 3.5–5.0)
Alkaline Phosphatase: 40 U/L (ref 38–126)
Anion gap: 9 (ref 5–15)
BUN: 15 mg/dL (ref 6–20)
CO2: 24 mmol/L (ref 22–32)
Calcium: 8.3 mg/dL — ABNORMAL LOW (ref 8.9–10.3)
Chloride: 105 mmol/L (ref 98–111)
Creatinine, Ser: 0.73 mg/dL (ref 0.44–1.00)
GFR, Estimated: >60 mL/min (ref >60)
Glucose, Bld: 122 mg/dL — ABNORMAL HIGH (ref 70–99)
Potassium: 3.8 mmol/L (ref 3.5–5.1)
Sodium: 138 mmol/L (ref 135–145)
Total Bilirubin: 0.4 mg/dL (ref 0.3–1.2)
Total Protein: 5.7 g/dL — ABNORMAL LOW (ref 6.5–8.1)

## 2020-07-12 LAB — CBC WITH DIFFERENTIAL/PLATELET
Abs Immature Granulocytes: 0.02 10*3/uL (ref 0.00–0.07)
Basophils Absolute: 0 10*3/uL (ref 0.0–0.1)
Basophils Relative: 1 %
Eosinophils Absolute: 0 10*3/uL (ref 0.0–0.5)
Eosinophils Relative: 1 %
HCT: 34.3 % — ABNORMAL LOW (ref 36.0–46.0)
Hemoglobin: 11.2 g/dL — ABNORMAL LOW (ref 12.0–15.0)
Immature Granulocytes: 1 %
Lymphocytes Relative: 20 %
Lymphs Abs: 0.7 10*3/uL (ref 0.7–4.0)
MCH: 30.5 pg (ref 26.0–34.0)
MCHC: 32.7 g/dL (ref 30.0–36.0)
MCV: 93.5 fL (ref 80.0–100.0)
Monocytes Absolute: 0.3 10*3/uL (ref 0.1–1.0)
Monocytes Relative: 9 %
Neutro Abs: 2.3 10*3/uL (ref 1.7–7.7)
Neutrophils Relative %: 68 %
Platelets: 164 10*3/uL (ref 150–400)
RBC: 3.67 MIL/uL — ABNORMAL LOW (ref 3.87–5.11)
RDW: 11.5 % (ref 11.5–15.5)
WBC: 3.4 10*3/uL — ABNORMAL LOW (ref 4.0–10.5)
nRBC: 0 % (ref 0.0–0.2)

## 2020-07-12 SURGERY — INSERTION, TUNNELED CENTRAL VENOUS DEVICE, WITH PORT
Anesthesia: General | Site: Chest | Laterality: Left

## 2020-07-12 MED ORDER — SODIUM CHLORIDE 0.9% FLUSH: 3.0000 mL | Freq: Two times a day (BID) | INTRAVENOUS | Status: DC

## 2020-07-12 MED ORDER — ORAL CARE MOUTH RINSE: 15.0000 mL | Freq: Once | OROMUCOSAL | Status: AC

## 2020-07-12 MED ORDER — DEXAMETHASONE SODIUM PHOSPHATE 10 MG/ML IJ SOLN
INTRAMUSCULAR | Status: DC | PRN
  Administered 2020-07-12: 8 mg via INTRAVENOUS

## 2020-07-12 MED ORDER — SODIUM CHLORIDE 0.9 % IV SOLN: INTRAVENOUS | Status: DC | PRN

## 2020-07-12 MED ORDER — FENTANYL CITRATE (PF) 100 MCG/2ML IJ SOLN
INTRAMUSCULAR | Status: AC
  Filled 2020-07-12: qty 2

## 2020-07-12 MED ORDER — ACETAMINOPHEN 650 MG RE SUPP
650.0000 mg | RECTAL | Status: DC | PRN
Start: 2020-07-12 — End: 2020-07-12

## 2020-07-12 MED ORDER — GLYCOPYRROLATE 0.2 MG/ML IJ SOLN
INTRAMUSCULAR | Status: DC | PRN
  Administered 2020-07-12: .2 mg via INTRAVENOUS

## 2020-07-12 MED ORDER — CHLORHEXIDINE GLUCONATE 0.12 % MT SOLN: 15.0000 mL | Freq: Once | OROMUCOSAL | Status: AC

## 2020-07-12 MED ORDER — ACETAMINOPHEN 325 MG PO TABS: 650.0000 mg | ORAL_TABLET | ORAL | Status: DC | PRN

## 2020-07-12 MED ORDER — ONDANSETRON HCL 4 MG/2ML IJ SOLN
INTRAMUSCULAR | Status: AC
  Filled 2020-07-12: qty 2

## 2020-07-12 MED ORDER — FENTANYL CITRATE (PF) 250 MCG/5ML IJ SOLN
INTRAMUSCULAR | Status: AC
  Filled 2020-07-12: qty 5

## 2020-07-12 MED ORDER — SODIUM CHLORIDE 0.9 % IV SOLN
INTRAVENOUS | Status: AC
  Filled 2020-07-12: qty 1.2

## 2020-07-12 MED ORDER — ACETAMINOPHEN 500 MG PO TABS
ORAL_TABLET | ORAL | Status: AC
  Administered 2020-07-12: 1000 mg via ORAL
  Filled 2020-07-12: qty 2

## 2020-07-12 MED ORDER — MIDAZOLAM HCL 2 MG/2ML IJ SOLN
INTRAMUSCULAR | Status: AC
  Filled 2020-07-12: qty 2

## 2020-07-12 MED ORDER — CEFAZOLIN SODIUM-DEXTROSE 2-4 GM/100ML-% IV SOLN
INTRAVENOUS | Status: AC
  Filled 2020-07-12: qty 100

## 2020-07-12 MED ORDER — PROPOFOL 10 MG/ML IV BOLUS
INTRAVENOUS | Status: DC | PRN
  Administered 2020-07-12: 170 mg via INTRAVENOUS

## 2020-07-12 MED ORDER — SODIUM CHLORIDE 0.9 % IV SOLN: 250.0000 mL | INTRAVENOUS | Status: DC | PRN

## 2020-07-12 MED ORDER — ACETAMINOPHEN 500 MG PO TABS: 1000.0000 mg | ORAL_TABLET | ORAL | Status: AC

## 2020-07-12 MED ORDER — 0.9 % SODIUM CHLORIDE (POUR BTL) OPTIME
TOPICAL | Status: DC | PRN
  Administered 2020-07-12: 1000 mL

## 2020-07-12 MED ORDER — LIDOCAINE HCL (CARDIAC) PF 100 MG/5ML IV SOSY
PREFILLED_SYRINGE | INTRAVENOUS | Status: DC | PRN
  Administered 2020-07-12: 80 mg via INTRAVENOUS

## 2020-07-12 MED ORDER — OXYCODONE HCL 5 MG PO TABS: 5.0000 mg | ORAL_TABLET | ORAL | Status: DC | PRN

## 2020-07-12 MED ORDER — HEPARIN SOD (PORK) LOCK FLUSH 100 UNIT/ML IV SOLN
INTRAVENOUS | Status: DC | PRN
  Administered 2020-07-12: 500 [IU]

## 2020-07-12 MED ORDER — FENTANYL CITRATE (PF) 100 MCG/2ML IJ SOLN
INTRAMUSCULAR | Status: DC | PRN
  Administered 2020-07-12: 25 ug via INTRAVENOUS
  Administered 2020-07-12: 50 ug via INTRAVENOUS

## 2020-07-12 MED ORDER — PHENYLEPHRINE HCL (PRESSORS) 10 MG/ML IV SOLN
INTRAVENOUS | Status: DC | PRN
  Administered 2020-07-12: 80 ug via INTRAVENOUS

## 2020-07-12 MED ORDER — HEPARIN SOD (PORK) LOCK FLUSH 100 UNIT/ML IV SOLN
INTRAVENOUS | Status: AC
  Filled 2020-07-12: qty 5

## 2020-07-12 MED ORDER — PROPOFOL 10 MG/ML IV BOLUS
INTRAVENOUS | Status: AC
  Filled 2020-07-12: qty 20

## 2020-07-12 MED ORDER — ONDANSETRON HCL 4 MG/2ML IJ SOLN
INTRAMUSCULAR | Status: DC | PRN
  Administered 2020-07-12: 4 mg via INTRAVENOUS

## 2020-07-12 MED ORDER — SODIUM CHLORIDE 0.9% FLUSH: 3.0000 mL | INTRAVENOUS | Status: DC | PRN

## 2020-07-12 MED ORDER — PHENYLEPHRINE 40 MCG/ML (10ML) SYRINGE FOR IV PUSH (FOR BLOOD PRESSURE SUPPORT)
PREFILLED_SYRINGE | INTRAVENOUS | Status: AC
  Filled 2020-07-12: qty 10

## 2020-07-12 MED ORDER — EPHEDRINE SULFATE 50 MG/ML IJ SOLN
INTRAMUSCULAR | Status: DC | PRN
  Administered 2020-07-12 (×2): 5 mg via INTRAVENOUS

## 2020-07-12 MED ORDER — BUPIVACAINE HCL (PF) 0.25 % IJ SOLN
INTRAMUSCULAR | Status: AC
  Filled 2020-07-12: qty 30

## 2020-07-12 MED ORDER — LACTATED RINGERS IV SOLN: INTRAVENOUS | Status: DC

## 2020-07-12 MED ORDER — BUPIVACAINE HCL 0.25 % IJ SOLN
INTRAMUSCULAR | Status: DC | PRN
  Administered 2020-07-12: 6 mL

## 2020-07-12 MED ORDER — MIDAZOLAM HCL 2 MG/2ML IJ SOLN
INTRAMUSCULAR | Status: DC | PRN
  Administered 2020-07-12: 2 mg via INTRAVENOUS

## 2020-07-12 MED ORDER — FENTANYL CITRATE (PF) 100 MCG/2ML IJ SOLN
25.0000 ug | INTRAMUSCULAR | Status: DC | PRN
  Administered 2020-07-12: 25 ug via INTRAVENOUS

## 2020-07-12 MED ORDER — CEFAZOLIN SODIUM-DEXTROSE 2-4 GM/100ML-% IV SOLN
2.0000 g | INTRAVENOUS | Status: AC
  Administered 2020-07-12: 2 g via INTRAVENOUS

## 2020-07-12 MED ORDER — CHLORHEXIDINE GLUCONATE 0.12 % MT SOLN
OROMUCOSAL | Status: AC
  Administered 2020-07-12: 15 mL via OROMUCOSAL
  Filled 2020-07-12: qty 15

## 2020-07-12 MED ORDER — ENSURE PRE-SURGERY PO LIQD: 296.0000 mL | Freq: Once | ORAL | Status: DC

## 2020-07-12 SURGICAL SUPPLY — 45 items
ADH SKN CLS APL DERMABOND .7 (GAUZE/BANDAGES/DRESSINGS) ×1
BAG DECANTER FOR FLEXI CONT (MISCELLANEOUS) ×2 IMPLANT
CHLORAPREP W/TINT 10.5 ML (MISCELLANEOUS) ×2 IMPLANT
COVER SURGICAL LIGHT HANDLE (MISCELLANEOUS) ×2 IMPLANT
COVER TRANSDUCER ULTRASND GEL (DISPOSABLE) ×2 IMPLANT
COVER WAND RF STERILE (DRAPES) ×2 IMPLANT
DECANTER SPIKE VIAL GLASS SM (MISCELLANEOUS) ×2 IMPLANT
DERMABOND ADVANCED (GAUZE/BANDAGES/DRESSINGS) ×1
DERMABOND ADVANCED .7 DNX12 (GAUZE/BANDAGES/DRESSINGS) ×1 IMPLANT
DRAPE C-ARM 42X120 X-RAY (DRAPES) ×2 IMPLANT
DRAPE CHEST BREAST 15X10 FENES (DRAPES) ×2 IMPLANT
DRSG TEGADERM 4X10 (GAUZE/BANDAGES/DRESSINGS) ×4 IMPLANT
ELECT CAUTERY BLADE 6.4 (BLADE) ×2 IMPLANT
ELECT REM PT RETURN 9FT ADLT (ELECTROSURGICAL) ×2
ELECTRODE REM PT RTRN 9FT ADLT (ELECTROSURGICAL) ×1 IMPLANT
GAUZE 4X4 16PLY RFD (DISPOSABLE) ×2 IMPLANT
GAUZE SPONGE 4X4 12PLY STRL (GAUZE/BANDAGES/DRESSINGS) ×2 IMPLANT
GEL ULTRASOUND 20GR AQUASONIC (MISCELLANEOUS) ×2 IMPLANT
GLOVE BIO SURGEON STRL SZ7 (GLOVE) ×2 IMPLANT
GLOVE BIOGEL PI IND STRL 7.5 (GLOVE) ×1 IMPLANT
GLOVE BIOGEL PI INDICATOR 7.5 (GLOVE) ×1
GOWN STRL REUS W/ TWL LRG LVL3 (GOWN DISPOSABLE) ×2 IMPLANT
GOWN STRL REUS W/TWL LRG LVL3 (GOWN DISPOSABLE) ×4
INTRODUCER COOK 11FR (CATHETERS) IMPLANT
KIT BASIN OR (CUSTOM PROCEDURE TRAY) ×2 IMPLANT
KIT PORT POWER 8FR ISP CVUE (Port) ×2 IMPLANT
KIT TURNOVER KIT B (KITS) ×2 IMPLANT
NS IRRIG 1000ML POUR BTL (IV SOLUTION) ×2 IMPLANT
PAD ARMBOARD 7.5X6 YLW CONV (MISCELLANEOUS) ×4 IMPLANT
PENCIL BUTTON HOLSTER BLD 10FT (ELECTRODE) ×2 IMPLANT
POSITIONER HEAD DONUT 9IN (MISCELLANEOUS) ×2 IMPLANT
SET INTRODUCER 12FR PACEMAKER (INTRODUCER) IMPLANT
SET SHEATH INTRODUCER 10FR (MISCELLANEOUS) IMPLANT
SHEATH COOK PEEL AWAY SET 9F (SHEATH) IMPLANT
STRIP CLOSURE SKIN 1/2X4 (GAUZE/BANDAGES/DRESSINGS) ×2 IMPLANT
SUT MNCRL AB 4-0 PS2 18 (SUTURE) ×2 IMPLANT
SUT PROLENE 2 0 SH DA (SUTURE) ×2 IMPLANT
SUT SILK 2 0 (SUTURE)
SUT SILK 2-0 18XBRD TIE 12 (SUTURE) IMPLANT
SUT VIC AB 3-0 SH 27 (SUTURE) ×2
SUT VIC AB 3-0 SH 27XBRD (SUTURE) ×1 IMPLANT
SYR 5ML LUER SLIP (SYRINGE) ×2 IMPLANT
TOWEL GREEN STERILE (TOWEL DISPOSABLE) ×2 IMPLANT
TOWEL GREEN STERILE FF (TOWEL DISPOSABLE) ×2 IMPLANT
TRAY LAPAROSCOPIC MC (CUSTOM PROCEDURE TRAY) ×2 IMPLANT

## 2020-07-12 NOTE — Anesthesia Procedure Notes (Signed)
Procedure Name: LMA Insertion Date/Time: 07/12/2020 9:19 AM Performed by: Inda Coke, CRNA Pre-anesthesia Checklist: Patient identified, Emergency Drugs available, Suction available, Timeout performed and Patient being monitored Patient Re-evaluated:Patient Re-evaluated prior to induction Oxygen Delivery Method: Circle system utilized Preoxygenation: Pre-oxygenation with 100% oxygen Induction Type: IV induction Ventilation: Mask ventilation without difficulty LMA: LMA inserted LMA Size: 4.0 Tube type: Oral Placement Confirmation: positive ETCO2,  CO2 detector and breath sounds checked- equal and bilateral Tube secured with: Tape Dental Injury: Teeth and Oropharynx as per pre-operative assessment

## 2020-07-12 NOTE — Telephone Encounter (Signed)
Received call from patient asking about her decadron and whether to take it today since she received decadron today with her port insertion. Informed her that she does not need to take the decadron today as she received 8mg  today.  Reminded her not take this tomorrow as well because she will receive the decadron tomorrow with her chemo infusion. Patient verbalized understanding.

## 2020-07-12 NOTE — H&P (Signed)
  58 yof here with her husband presents with palpable right breast mass. she recently palpated this in the shower. she has fh in her mom, sister and maternal aunt. her sister has recently been treated and had negative genetics. she had a mm and [place] that showed two right breast masses. at 7 oclock there is a 2.3 cm mass and a 2.2 cm mass at 9 oclock. there is also a single abnormal node present. 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 the node is positive. she is here to discuss options   Past Surgical History [name], MD; 06/29/2020 9:11 PM) Appendectomy  Cesarean Section - 1  Tonsillectomy   Allergies [name], MD; 06/29/2020 9:11 PM) Penicillins  Dermatitis. Fish  Difficulty breathing, Diarrhea.  Medication History [name], MD; 06/29/2020 9:11 PM) Fish Oil (500MG  Capsule, Oral) Active. Magnesium (300MG  Capsule, Oral) Active. Probiotic (Oral) Active. Vitamin D3 (125 MCG(5000 UT) Tablet Chewable, Oral) Active. ZyrTEC Allergy (10MG  Tablet, Oral) Active.  Social History [name], MD; 06/29/2020 9:11 PM) Alcohol use  Occasional alcohol use. Caffeine use  Coffee. No drug use  Tobacco use  Never smoker.  Family History [name], MD; 06/29/2020 9:11 PM) Breast Cancer  Mother, Sister. Depression  Brother. Heart Disease  Brother, Father. Hypertension  Father. Malignant Neoplasm Of Pancreas  Father. Melanoma  Sister.    Physical Exam [name] MD; 06/29/2020 9:09 PM) General Mental Status-Alert. Orientation-Oriented X3.  Breast Nipples-No Discharge. Note: 2.3 cm mass with some associated hematoma in loq of the right breast   Lymphatic Head & Neck  General Head & Neck Lymphatics: Bilateral - Description - Normal. Axillary  General Axillary Region: Bilateral - Description - Normal. Note: no [place]  adenopathy     Assessment & Plan [name] MD; 06/29/2020 9:11 PM) BREAST CANCER METASTASIZED TO AXILLARY LYMPH NODE, RIGHT (C50.911) Story: Genetics, MRI, port, primary systemic therapy We discussed the staging and pathophysiology of breast cancer. We discussed all of the different options for treatment for breast cancer including surgery, chemotherapy, radiation therapy, Herceptin, and antiestrogen therapy. I think mr reasonable to try to clarify if these are two distinct areas. with > 2 cm tumor and her 2 positive recommended neoadjuvant therapy. We discussed a likely TAD at time of surgery We discussed the options for treatment of the breast cancer which included lumpectomy (or double lumpectomy) versus a mastectomy. We discussed the performance of the lumpectomy with radioactive seed placement. We discussed a 5-10% chance of a positive margin requiring reexcision in the operating room. We also discussed that she will need radiation therapy if she undergoes lumpectomy. We discussed mastectomy and the postoperative care for that as well. Mastectomy can be followed by reconstruction. The decision for lumpectomy vs mastectomy has no impact on decision for chemotherapy. Most mastectomy patients will not need radiation therapy. We discussed that there is no difference in her survival whether she undergoes lumpectomy with radiation therapy or antiestrogen therapy versus a mastectomy. There is also no real difference between her recurrence in the breast. will make final surgical decision based on mri and on chemo response at end. I will see her at end of chemo. I discussed port placement with her if she is agreeable to the plan

## 2020-07-12 NOTE — Assessment & Plan Note (Signed)
06/21/2020:Patient palpated a right breast mass x 1 wk. Mammogram showed multiple confluent masses in the right breast at the 7 o'clock position measuring 2.3cm and at the 9 o'clock position measuring 2.2cm with one right axillary lymph node with cortical thickening. Biopsy showed invasive mammary carcinoma at both positions and in the axilla, grade 2-3, HER-2 equivocal by IHC (2+), positive by FISH (ratio 3.34), ER+ >95%, PR+ 25%, Ki67 60%.  Treatment plan: 1. Neoadjuvant chemotherapy with TCH Perjeta 6 cycles followed by Herceptin Perjeta versus [name] for 1 year 2. Followed by breast conserving surgery if possible with sentinel lymph node study 3. Followed by adjuvant radiation therapy if patient had lumpectomy 4.  Followed by adjuvant antiestrogen therapy 5.  Followed by neratinib ---------------------------------------------------------------------------------------------------------------------------------- Current treatment: Cycle 1 day 1 [place] Perjeta Echocardiogram 07/06/2020: EF 60 to 65% Labs have been reviewed, chemo education completed, chemo class completed. Once again discussed antiemetic regimen.  Return to clinic in 1 week for toxicity check

## 2020-07-12 NOTE — Transfer of Care (Signed)
Immediate Anesthesia Transfer of Care Note  Patient: Teresa Sheppard  Procedure(s) Performed: INSERTION PORT-A-CATH WITH ULTRASOUND GUIDANCE (Left Chest)  Patient Location: PACU  Anesthesia Type:General  Level of Consciousness: awake and drowsy  Airway & Oxygen Therapy: Patient Spontanous Breathing and Patient connected to nasal cannula oxygen  Post-op Assessment: Report given to RN and Post -op Vital signs reviewed and stable  Post vital signs: Reviewed and stable  Last Vitals:  Vitals Value Taken Time  BP 125/58 07/12/20 1010  Temp    Pulse 79 07/12/20 1011  Resp 16 07/12/20 1011  SpO2 100 % 07/12/20 1011  Vitals shown include unvalidated device data.  Last Pain:  Vitals:   07/12/20 0807  TempSrc:   PainSc: 0-No pain         Complications: No complications documented.

## 2020-07-12 NOTE — Op Note (Signed)
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 Serita Grammes Anesthesia: General Specimens: None Estimated blood loss: Minimal Drains: None Sponge count was correct at completion Disposition to recovery stable  Indications:59 yof here with her husband presents with palpable right breast mass. she recently palpated this in the shower. she has fh in her mom, sister and maternal aunt. her sister has recently been treated and had negative genetics. she had a mm and [place] that showed two right breast masses. at 7 oclock there is a 2.3 cm mass and a 2.2 cm mass at 9 oclock. there is also a single abnormal node present. 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 the node is positive.  She was seen at multidisciplinary clinic and is elected to proceed with primary systemic therapy.  Procedure: After informed consent was obtained the patient was taken to the operating room.  She was given antibiotics.  SCDs were in place.  She was placed under general anesthesia with an LMA without complication.  She was prepped and draped in the standard sterile surgical fashion.  A surgical timeout was then performed.  I used the ultrasound to identify her left internal jugular vein.  I went somewhat more superior and lateral than normal due to her large thyroid.  I made a nick in the skin.  I was able to access the internal jugular vein.  The first time I was unable to place the wire.  I then access it again under ultrasound guidance and the wire was passed into the correct position with fluoroscopy.  I then infiltrated Marcaine and made incision below her collarbone.  I developed a pocket for the port.  I then tunneled the line between the 2.  I then using fluoroscopy followed the  dilator and sheath over the wire.  I then remove the wire and the dilator.  The line was placed through the sheath.  The sheath was peeled away.  I pulled the line back to be in the distal cava.  I then hooked this to the port.  I sewed this into the pocket with 2-0 Prolene suture.  I then closed this with 3-0 Vicryl and 4-0 Monocryl.  The port aspirated easily and flushed.  I drew blood to send a CBC and a CMET.  I then accessed this.  I packed this with heparin.  Dressings were placed.  She tolerated this well and was transferred to recovery.

## 2020-07-12 NOTE — Discharge Instructions (Addendum)
    PORT-A-CATH: POST OP INSTRUCTIONS  Always review your discharge instruction sheet given to you by the facility where your surgery was performed.   1. A prescription for pain medication may be given to you upon discharge. Take your pain medication as prescribed, if needed. If narcotic pain medicine is not needed, then you make take acetaminophen (Tylenol) or ibuprofen (Advil) as needed.  2. Take your usually prescribed medications unless otherwise directed. 3. If you need a refill on your pain medication, please contact our office. All narcotic pain medicine now requires a paper prescription.  Phoned in and fax refills are no longer allowed by law.  Prescriptions will not be filled after 5 pm or on weekends.  4. You should follow a light diet for the remainder of the day after your procedure. 5. Most patients will experience some mild swelling and/or bruising in the area of the incision. It may take several days to resolve. 6. It is common to experience some constipation if taking pain medication after surgery. Increasing fluid intake and taking a stool softener (such as Colace) will usually help or prevent this problem from occurring. A mild laxative (Milk of Magnesia or Miralax) should be taken according to package directions if there are no bowel movements after 48 hours.  7. Unless discharge instructions indicate otherwise, you may remove your bandages 48 hours after surgery, and you may shower at that time. You may have steri-strips (small white skin tapes) in place directly over the incision.  These strips should be left on the skin for 7-10 days.  If your surgeon used Dermabond (skin glue) on the incision, you may shower in 24 hours.  The glue will flake off over the next 2-3 weeks.  8. If your port is left accessed at the end of surgery (needle left in port), the dressing cannot get wet and should only by changed by a healthcare professional. When the port is no longer accessed (when the  needle has been removed), follow step 7.   9. ACTIVITIES:  Limit activity involving your arms for the next 72 hours. Do no strenuous exercise or activity for 1 week. You may drive when you are no longer taking prescription pain medication, you can comfortably wear a seatbelt, and you can maneuver your car. 10.You may need to see your doctor in the office for a follow-up appointment.  Please       check with your doctor.  11.When you receive a new Port-a-Cath, you will get a product guide and        ID card.  Please keep them in case you need them.  WHEN TO CALL YOUR DOCTOR ([phone]): 1. Fever over 101.0 2. Chills 3. Continued bleeding from incision 4. Increased redness and tenderness at the site 5. Shortness of breath, difficulty breathing   The clinic staff is available to answer your questions during regular business hours. Please don't hesitate to call and ask to speak to one of the nurses or medical assistants for clinical concerns. If you have a medical emergency, go to the nearest emergency room or call 911.  A surgeon from [facility] is always on call at the hospital.     For further information, please visit [email or web address]      

## 2020-07-12 NOTE — Interval H&P Note (Signed)
History and Physical Interval Note:  07/12/2020 9:03 AM  [place]  has presented today for surgery, with the diagnosis of BREAST CANCER.  The various methods of treatment have been discussed with the patient and family. After consideration of risks, benefits and other options for treatment, the patient has consented to  Procedure(s): INSERTION PORT-A-CATH WITH ULTRASOUND GUIDANCE (N/A) as a surgical intervention.  The patient's history has been reviewed, patient examined, no change in status, stable for surgery.  I have reviewed the patient's chart and labs.  Questions were answered to the patient's satisfaction.     [name]

## 2020-07-12 NOTE — Anesthesia Postprocedure Evaluation (Signed)
Anesthesia Post Note  Patient: Teresa Sheppard  Procedure(s) Performed: INSERTION PORT-A-CATH WITH ULTRASOUND GUIDANCE (Left Chest)     Patient location during evaluation: PACU Anesthesia Type: General Level of consciousness: awake Pain management: pain level controlled Vital Signs Assessment: post-procedure vital signs reviewed and stable Respiratory status: spontaneous breathing Cardiovascular status: stable Postop Assessment: able to ambulate Anesthetic complications: no   No complications documented.  Last Vitals:  Vitals:   07/12/20 0731 07/12/20 1010  BP: (!) 148/73 (!) 125/58  Pulse: 84   Resp: 18 14  Temp: 36.4 C 37 C  SpO2: 98%     Last Pain:  Vitals:   07/12/20 1010  TempSrc:   PainSc: Asleep                 Michalina Calbert

## 2020-07-12 NOTE — Anesthesia Preprocedure Evaluation (Signed)
Anesthesia Evaluation  Patient identified by MRN, date of birth, ID band Patient awake    Reviewed: Allergy & Precautions, NPO status , Patient's Chart, lab work & pertinent test results  History of Anesthesia Complications (+) PONV  Airway Mallampati: II  TM Distance: >3 FB     Dental   Pulmonary    breath sounds clear to auscultation       Cardiovascular negative cardio ROS   Rhythm:Regular Rate:Normal     Neuro/Psych    GI/Hepatic negative GI ROS, Neg liver ROS,   Endo/Other  negative endocrine ROS  Renal/GU negative Renal ROS     Musculoskeletal   Abdominal   Peds  Hematology   Anesthesia Other Findings   Reproductive/Obstetrics                             Anesthesia Physical Anesthesia Plan  ASA: II  Anesthesia Plan:    Post-op Pain Management:    Induction: Intravenous  PONV Risk Score and Plan: 3 and Ondansetron, Dexamethasone and Midazolam  Airway Management Planned: LMA  Additional Equipment:   Intra-op Plan:   Post-operative Plan: Extubation in OR  Informed Consent: I have reviewed the patients History and Physical, chart, labs and discussed the procedure including the risks, benefits and alternatives for the proposed anesthesia with the patient or authorized representative who has indicated his/her understanding and acceptance.     Dental advisory given  Plan Discussed with: Anesthesiologist and CRNA  Anesthesia Plan Comments:         Anesthesia Quick Evaluation

## 2020-07-12 NOTE — Progress Notes (Signed)
Patient Care Team: Wenda Low, MD as PCP - General (Internal Medicine) Rockwell Germany, RN as Oncology Nurse Navigator Mauro Kaufmann, RN as Oncology Nurse Navigator [name], MD as Consulting Physician (General Surgery) Nicholas Lose, MD as Consulting Physician (Hematology and Oncology) Kyung Rudd, MD as Consulting Physician (Radiation Oncology)  DIAGNOSIS:    ICD-10-CM   1. 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 Mammogram showed multiple confluent masses in the right breast at the 7 o'clock position measuring 2.3cm and at the 9 o'clock position measuring 2.2cm with one right axillary lymph node with cortical thickening. Biopsy showed invasive mammary carcinoma at both positions and in the axilla, grade 2-3, HER-2 equivocal by IHC (2+), positive by FISH (ratio 3.34), ER+ >95%, PR+ 25%, Ki67 60%.   06/29/2020 Cancer Staging   Staging form: Breast, AJCC 8th Edition - Clinical stage from 06/29/2020: Stage IB (cT2, cN1(f), cM0, G3, ER+, PR+, HER2+) - Signed by Nicholas Lose, MD on 06/29/2020   07/13/2020 -  Chemotherapy    Patient is on Treatment Plan: BREAST  DOCETAXEL + CARBOPLATIN + TRASTUZUMAB + PERTUZUMAB  (TCHP) Q21D         CHIEF COMPLIANT: Cycle 1 Day 1 TCH Perjeta  INTERVAL HISTORY: Teresa Sheppard is a 60 y.o. with above-mentioned history of invasive mammary carcinoma of the right breast.She is currently on neoadjuvant chemotherapy with [place]. Echo on 07/05/20 showed an ejection fraction of 60-65%. Breast MRI on 07/02/20 showed the 3.5cm biopsy proven right breast mass and axillary lymph node, and an indeterminate 0.6cm left breast mass. [place] on 07/08/20  showed no correlate for the left breast mass seen on the MRI. Her port was placed by Dr. [name] on 07/12/20. She presents to the clinic today for treatment.   ALLERGIES:  is allergic to penicillins.  MEDICATIONS:  Current Outpatient Medications  Medication Sig Dispense Refill  . Acetaminophen (TYLENOL) 325 MG CAPS Take 162.5-325 mg by mouth every 6 (six) hours as needed (headaches / pain).    . cetirizine (ZYRTEC) 10 MG tablet Take 10 mg by mouth daily as needed for allergies.    . cholecalciferol (VITAMIN D3) 25 MCG (1000 UNIT) tablet Take 1,000 Units by mouth daily.    Marland Kitchen dexamethasone (DECADRON) 4 MG tablet Take 1 tablet (4 mg total) by mouth 2 (two) times daily. Take 1 tablet day before chemo and 1 tablet a day after chemo with food (Patient taking differently: Take 4 mg by mouth See admin instructions. Take 1 tablet day before chemo and 1 tablet a day after chemo with food) 12 tablet 0  . fluticasone (FLONASE) 50 MCG/ACT nasal spray Place 1 spray into both nostrils daily as needed for allergies or rhinitis.    Marland Kitchen lidocaine-prilocaine (EMLA) cream Apply to affected area once (Patient taking differently: Apply 1 application topically daily as needed (port access).) 30 g 3  . LORazepam (ATIVAN) 0.5 MG tablet Take 1 tablet (0.5 mg total) by mouth at bedtime. As needed 30 tablet 1  . Omega-3 Fatty Acids (FISH OIL) 1000 MG CAPS Take 1,000 mg by mouth 3 (three) times a week.    . ondansetron (ZOFRAN) 8 MG tablet Take 1  tablet (8 mg total) by mouth 2 (two) times daily as needed (Nausea or vomiting). Start on the third day after chemotherapy. 30 tablet 1  . Polyethyl Glycol-Propyl Glycol (SYSTANE OP) Place 1 drop into both eyes daily as needed (dry eyes).    . prochlorperazine (COMPAZINE) 10 MG tablet Take 1 tablet (10 mg total) by mouth every 6 (six) hours as needed (Nausea or vomiting). 30 tablet 1   No current facility-administered medications for this visit.    PHYSICAL EXAMINATION: ECOG  PERFORMANCE STATUS: 1 - Symptomatic but completely ambulatory  Vitals:   07/13/20 0839  BP: 127/67  Pulse: 63  Resp: 15  Temp: (!) 97 F (36.1 C)  SpO2: 100%   Filed Weights   07/13/20 0839  Weight: 149 lb 1.6 oz (67.6 kg)    LABORATORY DATA:  I have reviewed the data as listed CMP Latest Ref Rng & Units 07/12/2020 06/29/2020  Glucose 70 - 99 mg/dL 122(H) 99  BUN 6 - 20 mg/dL 15 14  Creatinine 0.44 - 1.00 mg/dL 0.73 0.87  Sodium 135 - 145 mmol/L 138 141  Potassium 3.5 - 5.1 mmol/L 3.8 4.4  Chloride 98 - 111 mmol/L 105 106  CO2 22 - 32 mmol/L 24 27  Calcium 8.9 - 10.3 mg/dL 8.3(L) 9.7  Total Protein 6.5 - 8.1 g/dL 5.7(L) 7.5  Total Bilirubin 0.3 - 1.2 mg/dL 0.4 0.5  Alkaline Phos 38 - 126 U/L 40 58  AST 15 - 41 U/L 20 21  ALT 0 - 44 U/L 14 15    Lab Results  Component Value Date   WBC 3.4 (L) 07/12/2020   HGB 11.2 (L) 07/12/2020   HCT 34.3 (L) 07/12/2020   MCV 93.5 07/12/2020   PLT 164 07/12/2020   NEUTROABS 2.3 07/12/2020    ASSESSMENT & PLAN:  Malignant neoplasm of overlapping sites of right breast in female, estrogen receptor positive (Teresa Sheppard) 06/21/2020:Patient palpated a right breast mass x 1 wk. Mammogram showed multiple confluent masses in the right breast at the 7 o'clock position measuring 2.3cm and at the 9 o'clock position measuring 2.2cm with one right axillary lymph node with cortical thickening. Biopsy showed invasive mammary carcinoma at both positions and in the axilla, grade 2-3, HER-2 equivocal by IHC (2+), positive by FISH (ratio 3.34), ER+ >95%, PR+ 25%, Ki67 60%.  Treatment plan: 1. Neoadjuvant chemotherapy with TCH Perjeta 6 cycles followed by Herceptin Perjeta versus [name] for 1 year 2. Followed by breast conserving surgery if possible with sentinel lymph node study 3. Followed by adjuvant radiation therapy if patient had lumpectomy 4.  Followed by adjuvant antiestrogen therapy 5.  Followed by  neratinib ---------------------------------------------------------------------------------------------------------------------------------- Current treatment: Cycle 1 day 1 [place] Perjeta Echocardiogram 07/06/2020: EF 60 to 65% Labs have been reviewed, chemo education completed, chemo class completed. Once again discussed antiemetic regimen.  She will need MRI guided biopsy of the left breast. Return to clinic in 1 week for toxicity check    No orders of the defined types were placed in this encounter.  The patient has a good understanding of the overall plan. she agrees with it. she will call with any problems that may develop before the next visit here.  Total time spent: 30 mins including face to face time and time spent for planning, charting and coordination of care  Nicholas Lose, MD 07/13/2020  I, Cloyde Reams Dorshimer, am acting as scribe for Dr. Nicholas Lose.  I have reviewed the above documentation for accuracy and completeness, and  I agree with the above.       

## 2020-07-13 ENCOUNTER — Inpatient Hospital Stay: Payer: BC Managed Care – PPO

## 2020-07-13 ENCOUNTER — Other Ambulatory Visit: Payer: Self-pay | Admitting: Hematology and Oncology

## 2020-07-13 ENCOUNTER — Ambulatory Visit: Payer: BC Managed Care – PPO

## 2020-07-13 ENCOUNTER — Other Ambulatory Visit: Payer: Self-pay

## 2020-07-13 ENCOUNTER — Inpatient Hospital Stay: Payer: BC Managed Care – PPO | Admitting: Hematology and Oncology

## 2020-07-13 ENCOUNTER — Encounter (HOSPITAL_COMMUNITY): Payer: Self-pay | Admitting: General Surgery

## 2020-07-13 ENCOUNTER — Ambulatory Visit: Payer: BC Managed Care – PPO | Admitting: Adult Health

## 2020-07-13 DIAGNOSIS — C50811 Malignant neoplasm of overlapping sites of right female breast: Secondary | ICD-10-CM

## 2020-07-13 DIAGNOSIS — Z170 Estrogen receptor positive status [ER+]: Secondary | ICD-10-CM | POA: Diagnosis not present

## 2020-07-13 DIAGNOSIS — Z5189 Encounter for other specified aftercare: Secondary | ICD-10-CM | POA: Diagnosis not present

## 2020-07-13 DIAGNOSIS — Z5111 Encounter for antineoplastic chemotherapy: Secondary | ICD-10-CM | POA: Diagnosis not present

## 2020-07-13 DIAGNOSIS — Z5112 Encounter for antineoplastic immunotherapy: Secondary | ICD-10-CM | POA: Diagnosis not present

## 2020-07-13 DIAGNOSIS — K5900 Constipation, unspecified: Secondary | ICD-10-CM | POA: Diagnosis not present

## 2020-07-13 MED ORDER — DIPHENHYDRAMINE HCL 25 MG PO CAPS
50.0000 mg | ORAL_CAPSULE | Freq: Once | ORAL | Status: AC
  Administered 2020-07-13: 50 mg via ORAL

## 2020-07-13 MED ORDER — SODIUM CHLORIDE 0.9% FLUSH
10.0000 mL | INTRAVENOUS | Status: DC | PRN
  Filled 2020-07-13: qty 10

## 2020-07-13 MED ORDER — TRASTUZUMAB-DKST CHEMO 150 MG IV SOLR
8.0000 mg/kg | Freq: Once | INTRAVENOUS | Status: AC
  Administered 2020-07-13: 546 mg via INTRAVENOUS
  Filled 2020-07-13: qty 26

## 2020-07-13 MED ORDER — DIPHENHYDRAMINE HCL 25 MG PO CAPS
ORAL_CAPSULE | ORAL | Status: AC
  Filled 2020-07-13: qty 2

## 2020-07-13 MED ORDER — ACETAMINOPHEN 325 MG PO TABS
ORAL_TABLET | ORAL | Status: AC
  Filled 2020-07-13: qty 2

## 2020-07-13 MED ORDER — SODIUM CHLORIDE 0.9 % IV SOLN
10.0000 mg | Freq: Once | INTRAVENOUS | Status: AC
  Administered 2020-07-13: 10 mg via INTRAVENOUS
  Filled 2020-07-13: qty 10

## 2020-07-13 MED ORDER — FOSAPREPITANT DIMEGLUMINE INJECTION 150 MG
150.0000 mg | Freq: Once | INTRAVENOUS | Status: AC
  Administered 2020-07-13: 150 mg via INTRAVENOUS
  Filled 2020-07-13: qty 150

## 2020-07-13 MED ORDER — SODIUM CHLORIDE 0.9 % IV SOLN
75.0000 mg/m2 | Freq: Once | INTRAVENOUS | Status: AC
  Administered 2020-07-13: 140 mg via INTRAVENOUS
  Filled 2020-07-13: qty 14

## 2020-07-13 MED ORDER — PALONOSETRON HCL INJECTION 0.25 MG/5ML
INTRAVENOUS | Status: AC
  Filled 2020-07-13: qty 5

## 2020-07-13 MED ORDER — SODIUM CHLORIDE 0.9 % IV SOLN
420.0000 mg | Freq: Once | INTRAVENOUS | Status: AC
  Administered 2020-07-13: 420 mg via INTRAVENOUS
  Filled 2020-07-13: qty 14

## 2020-07-13 MED ORDER — HEPARIN SOD (PORK) LOCK FLUSH 100 UNIT/ML IV SOLN
500.0000 [IU] | Freq: Once | INTRAVENOUS | Status: DC | PRN
  Filled 2020-07-13: qty 5

## 2020-07-13 MED ORDER — SODIUM CHLORIDE 0.9 % IV SOLN
Freq: Once | INTRAVENOUS | Status: AC
  Filled 2020-07-13: qty 250

## 2020-07-13 MED ORDER — PALONOSETRON HCL INJECTION 0.25 MG/5ML
0.2500 mg | Freq: Once | INTRAVENOUS | Status: AC
  Administered 2020-07-13: 0.25 mg via INTRAVENOUS

## 2020-07-13 MED ORDER — ACETAMINOPHEN 325 MG PO TABS
650.0000 mg | ORAL_TABLET | Freq: Once | ORAL | Status: AC
  Administered 2020-07-13: 650 mg via ORAL

## 2020-07-13 MED ORDER — SODIUM CHLORIDE 0.9 % IV SOLN
600.0000 mg | Freq: Once | INTRAVENOUS | Status: AC
  Administered 2020-07-13: 600 mg via INTRAVENOUS
  Filled 2020-07-13: qty 60

## 2020-07-13 NOTE — Patient Instructions (Signed)
[facility] Discharge Instructions for Patients Receiving Chemotherapy  Today you received the following chemotherapy agents trastuzumab carboplatin, docetaxel, Pertuzumab  To help prevent nausea and vomiting after your treatment, we encourage you to take your nausea medication as directed by MD    If you develop nausea and vomiting that is not controlled by your nausea medication, call the clinic.   BELOW ARE SYMPTOMS THAT SHOULD BE REPORTED IMMEDIATELY:  *FEVER GREATER THAN 100.5 F  *CHILLS WITH OR WITHOUT FEVER  NAUSEA AND VOMITING THAT IS NOT CONTROLLED WITH YOUR NAUSEA MEDICATION  *UNUSUAL SHORTNESS OF BREATH  *UNUSUAL BRUISING OR BLEEDING  TENDERNESS IN MOUTH AND THROAT WITH OR WITHOUT PRESENCE OF ULCERS  *URINARY PROBLEMS  *BOWEL PROBLEMS  UNUSUAL RASH Items with * indicate a potential emergency and should be followed up as soon as possible.  Feel free to call the clinic should you have any questions or concerns. The clinic phone number is [phone].  Please show the [facility] at check-in to the Emergency Department and triage nurse.

## 2020-07-13 NOTE — Progress Notes (Signed)
Nutrition Assessment   Reason for Assessment:  Patient left message for RD regarding foods to eat before treatment and other nutrition questions   ASSESSMENT:  60 year old female with right breast cancer.  Patient receiving neoadjuvant chemotherapy followed by surgery, radiation if lumpectomy and antiestrogen therapy.   Met with patient during infusion this am.  Patient with questions regarding foods to eat during treatment.  Apologized to patient that RD did not receive message left until this am.     Medications:  reviewed   Labs: reviewed   Anthropometrics:   Height: 69 inches Weight: 149 lb today 06/29/20 148 lb Patient reports trying to loose weight but that weight has continued to come off BMI: 22   Estimated Energy Needs  Kcals: 1675-2000 Protein: 84-100 g Fluid: 2 L   NUTRITION DIAGNOSIS: Food and nutrition related knowledge deficit related to cancer diagnosis as evidenced by questions related to diet   INTERVENTION:  Encouraged well balanced diet during treatment including good sources of protein. Discussed foods high in protein.  Handout provided regarding nutrition during treatment and foods to choose if having issues with nausea, diarrhea, fatigue, weight loss.  Discussed oral nutrition supplements. Drinking premier protein shake during treatment.   Contact information provided   MONITORING, EVALUATION, GOAL: weight loss, intake   Next Visit: Feb 9 (Wednesday) during infusion  Malick Netz B. Zenia Resides, [place], [place] Registered Dietitian [phone] (mobile)

## 2020-07-13 NOTE — Progress Notes (Signed)
Pt discharged in no apparent distress. Pt left ambulatory without assistance. Pt aware of discharge instructions and verbalized understanding and had no further questions.  

## 2020-07-13 NOTE — Progress Notes (Signed)
Per [name], proceed with pertuzumab 420 mg IV today.

## 2020-07-14 ENCOUNTER — Ambulatory Visit: Payer: BC Managed Care – PPO

## 2020-07-14 ENCOUNTER — Telehealth: Payer: Self-pay | Admitting: *Deleted

## 2020-07-14 ENCOUNTER — Telehealth: Payer: Self-pay | Admitting: Hematology and Oncology

## 2020-07-14 NOTE — Telephone Encounter (Signed)
No 11/9 los, no changes made to pt schedule  

## 2020-07-15 ENCOUNTER — Other Ambulatory Visit: Payer: Self-pay

## 2020-07-15 ENCOUNTER — Inpatient Hospital Stay: Payer: BC Managed Care – PPO

## 2020-07-15 VITALS — BP 143/78 | HR 57 | Resp 18

## 2020-07-15 DIAGNOSIS — C50811 Malignant neoplasm of overlapping sites of right female breast: Secondary | ICD-10-CM

## 2020-07-15 DIAGNOSIS — K5900 Constipation, unspecified: Secondary | ICD-10-CM | POA: Diagnosis not present

## 2020-07-15 DIAGNOSIS — Z5112 Encounter for antineoplastic immunotherapy: Secondary | ICD-10-CM | POA: Diagnosis not present

## 2020-07-15 DIAGNOSIS — Z170 Estrogen receptor positive status [ER+]: Secondary | ICD-10-CM | POA: Diagnosis not present

## 2020-07-15 DIAGNOSIS — Z5111 Encounter for antineoplastic chemotherapy: Secondary | ICD-10-CM | POA: Diagnosis not present

## 2020-07-15 DIAGNOSIS — Z5189 Encounter for other specified aftercare: Secondary | ICD-10-CM | POA: Diagnosis not present

## 2020-07-15 MED ORDER — PEGFILGRASTIM-CBQV 6 MG/0.6ML ~~LOC~~ SOSY
PREFILLED_SYRINGE | SUBCUTANEOUS | Status: AC
  Filled 2020-07-15: qty 0.6

## 2020-07-15 MED ORDER — PEGFILGRASTIM-CBQV 6 MG/0.6ML ~~LOC~~ SOSY
6.0000 mg | PREFILLED_SYRINGE | Freq: Once | SUBCUTANEOUS | Status: AC
  Administered 2020-07-15: 6 mg via SUBCUTANEOUS

## 2020-07-15 NOTE — Patient Instructions (Signed)
Pegfilgrastim injection What is this medicine? PEGFILGRASTIM (PEG fil gra stim) is a long-acting granulocyte colony-stimulating factor that stimulates the growth of neutrophils, a type of white blood cell important in the body's fight against infection. It is used to reduce the incidence of fever and infection in patients with certain types of cancer who are receiving chemotherapy that affects the bone marrow, and to increase survival after being exposed to high doses of radiation. This medicine may be used for other purposes; ask your health care provider or pharmacist if you have questions. COMMON BRAND NAME(S): Fulphila, Neulasta, Nyvepria, UDENYCA, Ziextenzo What should I tell my health care provider before I take this medicine? They need to know if you have any of these conditions:  kidney disease  latex allergy  ongoing radiation therapy  sickle cell disease  skin reactions to acrylic adhesives (On-Body Injector only)  an unusual or allergic reaction to pegfilgrastim, filgrastim, other medicines, foods, dyes, or preservatives  pregnant or trying to get pregnant  breast-feeding How should I use this medicine? This medicine is for injection under the skin. If you get this medicine at home, you will be taught how to prepare and give the pre-filled syringe or how to use the On-body Injector. Refer to the patient Instructions for Use for detailed instructions. Use exactly as directed. Tell your healthcare provider immediately if you suspect that the On-body Injector may not have performed as intended or if you suspect the use of the On-body Injector resulted in a missed or partial dose. It is important that you put your used needles and syringes in a special sharps container. Do not put them in a trash can. If you do not have a sharps container, call your pharmacist or healthcare provider to get one. Talk to your pediatrician regarding the use of this medicine in children. While this drug  may be prescribed for selected conditions, precautions do apply. Overdosage: If you think you have taken too much of this medicine contact a poison control center or emergency room at once. NOTE: This medicine is only for you. Do not share this medicine with others. What if I miss a dose? It is important not to miss your dose. Call your doctor or health care professional if you miss your dose. If you miss a dose due to an On-body Injector failure or leakage, a new dose should be administered as soon as possible using a single prefilled syringe for manual use. What may interact with this medicine? Interactions have not been studied. This list may not describe all possible interactions. Give your health care provider a list of all the medicines, herbs, non-prescription drugs, or dietary supplements you use. Also tell them if you smoke, drink alcohol, or use illegal drugs. Some items may interact with your medicine. What should I watch for while using this medicine? Your condition will be monitored carefully while you are receiving this medicine. You may need blood work done while you are taking this medicine. Talk to your health care provider about your risk of cancer. You may be more at risk for certain types of cancer if you take this medicine. If you are going to need a MRI, CT scan, or other procedure, tell your doctor that you are using this medicine (On-Body Injector only). What side effects may I notice from receiving this medicine? Side effects that you should report to your doctor or health care professional as soon as possible:  allergic reactions (skin rash, itching or hives, swelling of   the face, lips, or tongue)  back pain  dizziness  fever  pain, redness, or irritation at site where injected  pinpoint red spots on the skin  red or dark-brown urine  shortness of breath or breathing problems  stomach or side pain, or pain at the shoulder  swelling  tiredness  trouble  passing urine or change in the amount of urine  unusual bruising or bleeding Side effects that usually do not require medical attention (report to your doctor or health care professional if they continue or are bothersome):  bone pain  muscle pain This list may not describe all possible side effects. Call your doctor for medical advice about side effects. You may report side effects to FDA at 1-800-FDA-1088. Where should I keep my medicine? Keep out of the reach of children. If you are using this medicine at home, you will be instructed on how to store it. Throw away any unused medicine after the expiration date on the label. NOTE: This sheet is a summary. It may not cover all possible information. If you have questions about this medicine, talk to your doctor, pharmacist, or health care provider.  2021 Elsevier/Gold Standard (2019-07-03 13:20:51)  

## 2020-07-16 ENCOUNTER — Ambulatory Visit: Payer: BC Managed Care – PPO

## 2020-07-18 ENCOUNTER — Other Ambulatory Visit: Payer: BC Managed Care – PPO

## 2020-07-18 ENCOUNTER — Ambulatory Visit: Payer: BC Managed Care – PPO | Admitting: Hematology and Oncology

## 2020-07-18 ENCOUNTER — Telehealth: Payer: Self-pay | Admitting: *Deleted

## 2020-07-18 NOTE — Telephone Encounter (Signed)
Received message from patient that she had questions regarding her MR bx tomorrow.  Called and spoke with patient she some questions regarding taking something to help relax her during her MR bx, what to eat for breakfast, and is it ok to drive now since having her port placed.  Informed patient she could take her lorazepam 1 an hour prior to procedure and then bring one with her and that she should NOT have anything to eat/drink 4 hours prior to procedure.  Let her know that it is ok that that she drives just not to her bx since she will be taking medication to relax her.  Patient verbalized understanding.

## 2020-07-19 ENCOUNTER — Other Ambulatory Visit (HOSPITAL_COMMUNITY): Payer: Self-pay | Admitting: Diagnostic Radiology

## 2020-07-19 ENCOUNTER — Other Ambulatory Visit: Payer: Self-pay

## 2020-07-19 ENCOUNTER — Telehealth: Payer: Self-pay | Admitting: *Deleted

## 2020-07-19 ENCOUNTER — Ambulatory Visit
Admission: RE | Admit: 2020-07-19 | Discharge: 2020-07-19 | Disposition: A | Discharge status: Home / Self Care | Payer: BC Managed Care – PPO | Source: Ambulatory Visit | Attending: Hematology and Oncology | Admitting: Hematology and Oncology

## 2020-07-19 DIAGNOSIS — C50811 Malignant neoplasm of overlapping sites of right female breast: Secondary | ICD-10-CM

## 2020-07-19 DIAGNOSIS — Z170 Estrogen receptor positive status [ER+]: Secondary | ICD-10-CM

## 2020-07-19 DIAGNOSIS — N6323 Unspecified lump in the left breast, lower outer quadrant: Secondary | ICD-10-CM | POA: Diagnosis not present

## 2020-07-19 MED ORDER — GADOBUTROL 1 MMOL/ML IV SOLN
6.0000 mL | Freq: Once | INTRAVENOUS | Status: AC | PRN
  Administered 2020-07-19: 6 mL via INTRAVENOUS

## 2020-07-19 NOTE — Telephone Encounter (Signed)
Received call from patient wanting to know if it's ok for her to take biotin, D3, probiotic gummy. Informed her that this was ok to take.

## 2020-07-20 NOTE — Progress Notes (Signed)
Patient Care Team: Teresa Low, MD as PCP - General (Internal Medicine) Rockwell Germany, RN as Oncology Nurse Navigator Mauro Kaufmann, RN as Oncology Nurse Navigator [name], MD as Consulting Physician (General Surgery) Nicholas Lose, MD as Consulting Physician (Hematology and Oncology) Kyung Rudd, MD as Consulting Physician (Radiation Oncology)  DIAGNOSIS:    ICD-10-CM   1. Malignant neoplasm of overlapping sites of right breast in female, estrogen receptor positive ([place])  C50.811    Z17.0     SUMMARY OF ONCOLOGIC HISTORY: Oncology History  Malignant neoplasm of overlapping sites of right breast in female, estrogen receptor positive ([place])  06/21/2020 Initial Diagnosis   Patient palpated a right breast mass x 1 wk. Mammogram showed multiple confluent masses in the right breast at the 7 o'clock position measuring 2.3cm and at the 9 o'clock position measuring 2.2cm with one right axillary lymph node with cortical thickening. Biopsy showed invasive mammary carcinoma at both positions and in the axilla, grade 2-3, HER-2 equivocal by IHC (2+), positive by FISH (ratio 3.34), ER+ >95%, PR+ 25%, Ki67 60%.   06/29/2020 Cancer Staging   Staging form: Breast, AJCC 8th Edition - Clinical stage from 06/29/2020: Stage IB (cT2, cN1(f), cM0, G3, ER+, PR+, HER2+) - Signed by Nicholas Lose, MD on 06/29/2020   07/13/2020 -  Chemotherapy    Patient is on Treatment Plan: BREAST  DOCETAXEL + CARBOPLATIN + TRASTUZUMAB + PERTUZUMAB  (TCHP) Q21D         CHIEF COMPLIANT: Cycle 1 Day 8 TCH Perjeta  INTERVAL HISTORY: Teresa Sheppard is a 60 y.o. with above-mentioned history of invasive mammary carcinoma of the right breast. She is currently on neoadjuvant chemotherapy with [place]. She presents to the clinic todayfor a toxicity check following cycle 1.   She tolerated chemotherapy extremely well.  She did not have any nausea or vomiting.  She did have constipation which required milk of  magnesia.  After that she had some loose stools but overall she did not have any significant diarrhea.  Some days she gets loose stool but it has not translated into diarrhea.  ALLERGIES:  is allergic to penicillins.  MEDICATIONS:  Current Outpatient Medications  Medication Sig Dispense Refill  . Acetaminophen (TYLENOL) 325 MG CAPS Take 162.5-325 mg by mouth every 6 (six) hours as needed (headaches / pain).    . cetirizine (ZYRTEC) 10 MG tablet Take 10 mg by mouth daily as needed for allergies.    . cholecalciferol (VITAMIN D3) 25 MCG (1000 UNIT) tablet Take 1,000 Units by mouth daily.    Marland Kitchen dexamethasone (DECADRON) 4 MG tablet Take 1 tablet (4 mg total) by mouth 2 (two) times daily. Take 1 tablet day before chemo and 1 tablet a day after chemo with food (Patient taking differently: Take 4 mg by mouth See admin instructions. Take 1 tablet day before chemo and 1 tablet a day after chemo with food) 12 tablet 0  . fluticasone (FLONASE) 50 MCG/ACT nasal spray Place 1 spray into both nostrils daily as needed for allergies or rhinitis.    Marland Kitchen lidocaine-prilocaine (EMLA) cream Apply to affected area once (Patient taking differently: Apply 1 application topically daily as needed (port access).) 30 g 3  . LORazepam (ATIVAN) 0.5 MG tablet Take 1 tablet (0.5 mg total) by mouth at bedtime. As needed 30 tablet 1  . Omega-3 Fatty Acids (FISH OIL) 1000 MG CAPS Take 1,000 mg by mouth 3 (three) times a week.    . ondansetron (ZOFRAN)  8 MG tablet Take 1 tablet (8 mg total) by mouth 2 (two) times daily as needed (Nausea or vomiting). Start on the third day after chemotherapy. 30 tablet 1  . Polyethyl Glycol-Propyl Glycol (SYSTANE OP) Place 1 drop into both eyes daily as needed (dry eyes).    . prochlorperazine (COMPAZINE) 10 MG tablet Take 1 tablet (10 mg total) by mouth every 6 (six) hours as needed (Nausea or vomiting). 30 tablet 1   No current facility-administered medications for this visit.    PHYSICAL  EXAMINATION: ECOG PERFORMANCE STATUS: 1 - Symptomatic but completely ambulatory  Vitals:   07/21/20 1353  BP: 125/61  Pulse: 85  Resp: 17  Temp: 98.1 F (36.7 C)  SpO2: 100%   Filed Weights   07/21/20 1353  Weight: 146 lb 1.6 oz (66.3 kg)    LABORATORY DATA:  I have reviewed the data as listed CMP Latest Ref Rng & Units 07/12/2020 06/29/2020  Glucose 70 - 99 mg/dL 122(H) 99  BUN 6 - 20 mg/dL 15 14  Creatinine 0.44 - 1.00 mg/dL 0.73 0.87  Sodium 135 - 145 mmol/L 138 141  Potassium 3.5 - 5.1 mmol/L 3.8 4.4  Chloride 98 - 111 mmol/L 105 106  CO2 22 - 32 mmol/L 24 27  Calcium 8.9 - 10.3 mg/dL 8.3(L) 9.7  Total Protein 6.5 - 8.1 g/dL 5.7(L) 7.5  Total Bilirubin 0.3 - 1.2 mg/dL 0.4 0.5  Alkaline Phos 38 - 126 U/L 40 58  AST 15 - 41 U/L 20 21  ALT 0 - 44 U/L 14 15    Lab Results  Component Value Date   WBC 31.8 (H) 07/21/2020   HGB 11.9 (L) 07/21/2020   HCT 35.8 (L) 07/21/2020   MCV 91.8 07/21/2020   PLT 154 07/21/2020   NEUTROABS PENDING 07/21/2020    ASSESSMENT & PLAN:  Malignant neoplasm of overlapping sites of right breast in female, estrogen receptor positive ([place]) 06/21/2020:Patient palpated a right breast mass x 1 wk. Mammogram showed multiple confluent masses in the right breast at the 7 o'clock position measuring 2.3cm and at the 9 o'clock position measuring 2.2cm with one right axillary lymph node with cortical thickening. Biopsy showed invasive mammary carcinoma at both positions and in the axilla, grade 2-3, HER-2 equivocal by IHC (2+), positive by FISH (ratio 3.34), ER+ >95%, PR+ 25%, Ki67 60%.  Treatment plan: 1. Neoadjuvant chemotherapy with TCH Perjeta 6 cycles followed by HerceptinPerjeta versus Kadcylamaintenance for 1 year 2. Followed by breast conserving surgery if possible with sentinel lymph node study 3. Followed by adjuvant radiation therapy if patient had lumpectomy 4.Followed by adjuvant antiestrogen therapy 5.Followed by  neratinib ---------------------------------------------------------------------------------------------------------------------------------- Current treatment: Cycle 1 day 8 TCH Perjeta Echocardiogram 07/06/2020: EF 60 to 65%  Chemo toxicities: 1.  Pelvic pain due to Neulasta Denies any nausea or vomiting 2. episode of constipation which required milk of magnesia Occasional loose stools  MRI guided biopsy of the left breast: Benign, papillomatous lesion cannot be ruled out.  I discussed with her that surgery will have to determine if it needs to be removed Return to clinic in 1 week for toxicity check     No orders of the defined types were placed in this encounter.  The patient has a good understanding of the overall plan. she agrees with it. she will call with any problems that may develop before the next visit here.  Total time spent: 30 mins including face to face time and time spent for planning, charting and  coordination of care  Nicholas Lose, MD 07/21/2020  Julious Oka Dorshimer, am acting as scribe for Dr. Nicholas Lose.  I have reviewed the above documentation for accuracy and completeness, and I agree with the above.

## 2020-07-20 NOTE — Assessment & Plan Note (Signed)
06/21/2020:Patient palpated a right breast mass x 1 wk. Mammogram showed multiple confluent masses in the right breast at the 7 o'clock position measuring 2.3cm and at the 9 o'clock position measuring 2.2cm with one right axillary lymph node with cortical thickening. Biopsy showed invasive mammary carcinoma at both positions and in the axilla, grade 2-3, HER-2 equivocal by IHC (2+), positive by FISH (ratio 3.34), ER+ >95%, PR+ 25%, Ki67 60%.  Treatment plan: 1. Neoadjuvant chemotherapy with TCH Perjeta 6 cycles followed by HerceptinPerjeta versus Kadcylamaintenance for 1 year 2. Followed by breast conserving surgery if possible with sentinel lymph node study 3. Followed by adjuvant radiation therapy if patient had lumpectomy 4.Followed by adjuvant antiestrogen therapy 5.Followed by neratinib ---------------------------------------------------------------------------------------------------------------------------------- Current treatment: Cycle 1 day 8 TCH Perjeta Echocardiogram 07/06/2020: EF 60 to 65%  Chemo toxicities:  Labs have been reviewed, chemo education completed, chemo class completed. Once again discussed antiemetic regimen.  She will need MRI guided biopsy of the left breast. Return to clinic in 1 week for toxicity check

## 2020-07-21 ENCOUNTER — Encounter: Payer: Self-pay | Admitting: Emergency Medicine

## 2020-07-21 ENCOUNTER — Encounter: Payer: Self-pay | Admitting: *Deleted

## 2020-07-21 ENCOUNTER — Inpatient Hospital Stay (HOSPITAL_BASED_OUTPATIENT_CLINIC_OR_DEPARTMENT_OTHER): Payer: BC Managed Care – PPO | Admitting: Hematology and Oncology

## 2020-07-21 ENCOUNTER — Other Ambulatory Visit: Payer: Self-pay

## 2020-07-21 ENCOUNTER — Inpatient Hospital Stay: Payer: BC Managed Care – PPO

## 2020-07-21 DIAGNOSIS — Z170 Estrogen receptor positive status [ER+]: Secondary | ICD-10-CM | POA: Diagnosis not present

## 2020-07-21 DIAGNOSIS — Z5111 Encounter for antineoplastic chemotherapy: Secondary | ICD-10-CM | POA: Diagnosis not present

## 2020-07-21 DIAGNOSIS — C50811 Malignant neoplasm of overlapping sites of right female breast: Secondary | ICD-10-CM

## 2020-07-21 DIAGNOSIS — Z5112 Encounter for antineoplastic immunotherapy: Secondary | ICD-10-CM | POA: Diagnosis not present

## 2020-07-21 DIAGNOSIS — K5900 Constipation, unspecified: Secondary | ICD-10-CM | POA: Diagnosis not present

## 2020-07-21 DIAGNOSIS — Z5189 Encounter for other specified aftercare: Secondary | ICD-10-CM | POA: Diagnosis not present

## 2020-07-21 LAB — CBC WITH DIFFERENTIAL/PLATELET
Abs Immature Granulocytes: 5.63 10*3/uL — ABNORMAL HIGH (ref 0.00–0.07)
Basophils Absolute: 0 10*3/uL (ref 0.0–0.1)
Basophils Relative: 0 %
Eosinophils Absolute: 0 10*3/uL (ref 0.0–0.5)
Eosinophils Relative: 0 %
HCT: 35.8 % — ABNORMAL LOW (ref 36.0–46.0)
Hemoglobin: 11.9 g/dL — ABNORMAL LOW (ref 12.0–15.0)
Immature Granulocytes: 18 %
Lymphocytes Relative: 10 %
Lymphs Abs: 3 10*3/uL (ref 0.7–4.0)
MCH: 30.5 pg (ref 26.0–34.0)
MCHC: 33.2 g/dL (ref 30.0–36.0)
MCV: 91.8 fL (ref 80.0–100.0)
Monocytes Absolute: 4.5 10*3/uL — ABNORMAL HIGH (ref 0.1–1.0)
Monocytes Relative: 14 %
Neutro Abs: 18.6 10*3/uL — ABNORMAL HIGH (ref 1.7–7.7)
Neutrophils Relative %: 58 %
Platelets: 154 10*3/uL (ref 150–400)
RBC: 3.9 MIL/uL (ref 3.87–5.11)
RDW: 11.7 % (ref 11.5–15.5)
WBC: 31.8 10*3/uL — ABNORMAL HIGH (ref 4.0–10.5)
nRBC: 0.4 % — ABNORMAL HIGH (ref 0.0–0.2)

## 2020-07-21 LAB — COMPREHENSIVE METABOLIC PANEL
ALT: 29 U/L (ref 0–44)
AST: 38 U/L (ref 15–41)
Albumin: 3.8 g/dL (ref 3.5–5.0)
Alkaline Phosphatase: 151 U/L — ABNORMAL HIGH (ref 38–126)
Anion gap: 6 (ref 5–15)
BUN: 19 mg/dL (ref 6–20)
CO2: 28 mmol/L (ref 22–32)
Calcium: 9.4 mg/dL (ref 8.9–10.3)
Chloride: 101 mmol/L (ref 98–111)
Creatinine, Ser: 0.8 mg/dL (ref 0.44–1.00)
GFR, Estimated: >60 mL/min (ref >60)
Glucose, Bld: 128 mg/dL — ABNORMAL HIGH (ref 70–99)
Potassium: 3.7 mmol/L (ref 3.5–5.1)
Sodium: 135 mmol/L (ref 135–145)
Total Bilirubin: <0.2 mg/dL — ABNORMAL LOW (ref 0.3–1.2)
Total Protein: 7 g/dL (ref 6.5–8.1)

## 2020-07-25 ENCOUNTER — Telehealth: Payer: Self-pay | Admitting: *Deleted

## 2020-07-25 NOTE — Telephone Encounter (Signed)
Received message from patient that she has had some light blood tinged drainage from her nose and had placed some neosporin in the nose to help and was asking what else she can do.  Spoke with to let her know this is normal with treatment and also with the dry winter air.  Encouraged her to use vasoline around her nose instead of neosporin and she can use a humidifier, saline nasal sprays, or just steam from a hot shower. Patient verbalized understanding.

## 2020-08-01 ENCOUNTER — Other Ambulatory Visit: Payer: BC Managed Care – PPO

## 2020-08-01 ENCOUNTER — Telehealth: Payer: Self-pay | Admitting: *Deleted

## 2020-08-01 ENCOUNTER — Ambulatory Visit: Payer: BC Managed Care – PPO | Admitting: Adult Health

## 2020-08-01 ENCOUNTER — Ambulatory Visit: Payer: BC Managed Care – PPO

## 2020-08-01 NOTE — Telephone Encounter (Signed)
Discussed anti-nausea medications for TCHP cycle 2 and provided instructions for EMLA cream application prior to arrival on 2/9 for infusion. No further needs voiced.

## 2020-08-02 NOTE — Progress Notes (Signed)
HEMATOLOGY-ONCOLOGY MYCHART VIDEO VISIT PROGRESS NOTE  I connected with Teresa Sheppard on 08/03/2020 at  9:45 AM EST by MyChart video conference and verified that I am speaking with the correct person using two identifiers.  I discussed the limitations, risks, security and privacy concerns of performing an evaluation and management service by MyChart and the availability of in person appointments.  I also discussed with the patient that there may be a patient responsible charge related to this service. The patient expressed understanding and agreed to proceed.  Patient's Location: Home Physician Location: Clinic  CHIEF COMPLIANT: Cycle 2 Day 1TCH Perj[facility]  INTERVAL HISTORY: Teresa Sheppard is a 60 y.o. female with above-mentioned history of invasive mammary carcinoma of the right breast. She is currently on neoadjuvant chemotherapy with [place]. She presents to the clinic todayfora toxicity check and cycle 2.   Oncology History  Malignant neoplasm of overlapping sites of right breast in female, estrogen receptor positive ([place])  06/21/2020 Initial Diagnosis   Patient palpated a right breast mass x 1 wk. Mammogram showed multiple confluent masses in the right breast at the 7 o'clock position measuring 2.3cm and at the 9 o'clock position measuring 2.2cm with one right axillary lymph node with cortical thickening. Biopsy showed invasive mammary carcinoma at both positions and in the axilla, grade 2-3, HER-2 equivocal by IHC (2+), positive by FISH (ratio 3.34), ER+ >95%, PR+ 25%, Ki67 60%.   06/29/2020 Cancer Staging   Staging form: Breast, AJCC 8th Edition - Clinical stage from 06/29/2020: Stage IB (cT2, cN1(f), cM0, G3, ER+, PR+, HER2+) - Signed by Nicholas Lose, MD on 06/29/2020   07/13/2020 -  Chemotherapy    Patient is on Treatment Plan: BREAST  DOCETAXEL + CARBOPLATIN + TRASTUZUMAB + PERTUZUMAB  (TCHP) Q21D         Observations/Objective:  There were no vitals filed for this  visit. There is no height or weight on file to calculate BMI.  I have reviewed the data as listed CMP Latest Ref Rng & Units 07/21/2020 07/12/2020 06/29/2020  Glucose 70 - 99 mg/dL 128(H) 122(H) 99  BUN 6 - 20 mg/dL 19 15 14   Creatinine 0.44 - 1.00 mg/dL 0.80 0.73 0.87  Sodium 135 - 145 mmol/L 135 138 141  Potassium 3.5 - 5.1 mmol/L 3.7 3.8 4.4  Chloride 98 - 111 mmol/L 101 105 106  CO2 22 - 32 mmol/L 28 24 27   Calcium 8.9 - 10.3 mg/dL 9.4 8.3(L) 9.7  Total Protein 6.5 - 8.1 g/dL 7.0 5.7(L) 7.5  Total Bilirubin 0.3 - 1.2 mg/dL <0.2(L) 0.4 0.5  Alkaline Phos 38 - 126 U/L 151(H) 40 58  AST 15 - 41 U/L 38 20 21  ALT 0 - 44 U/L 29 14 15     Lab Results  Component Value Date   WBC 31.8 (H) 07/21/2020   HGB 11.9 (L) 07/21/2020   HCT 35.8 (L) 07/21/2020   MCV 91.8 07/21/2020   PLT 154 07/21/2020   NEUTROABS 18.6 (H) 07/21/2020      Assessment Plan:  Malignant neoplasm of overlapping sites of right breast in female, estrogen receptor positive ([place]) 06/21/2020:Patient palpated a right breast mass x 1 wk. Mammogram showed multiple confluent masses in the right breast at the 7 o'clock position measuring 2.3cm and at the 9 o'clock position measuring 2.2cm with one right axillary lymph node with cortical thickening. Biopsy showed invasive mammary carcinoma at both positions and in the axilla, grade 2-3, HER-2 equivocal by IHC (2+), positive by  FISH (ratio 3.34), ER+ >95%, PR+ 25%, Ki67 60%.  Treatment plan: 1. Neoadjuvant chemotherapy with TCH Perjeta 6 cycles followed by HerceptinPerjeta versus Kadcylamaintenance for 1 year 2. Followed by breast conserving surgery if possible with sentinel lymph node study 3. Followed by adjuvant radiation therapy if patient had lumpectomy 4.Followed by adjuvant antiestrogen therapy 5.Followed by neratinib ---------------------------------------------------------------------------------------------------------------------------------- Current  treatment: Cycle 2 TCH Perjeta Echocardiogram 07/06/2020: EF 60 to 65%  Chemo toxicities: 1.  Pelvic pain due to Neulasta (3 days of pain) 2. episode of constipation which required milk of magnesia Occasional loose stools  MRI guided biopsy of the left breast: Benign, papillomatous lesion cannot be ruled out.  I discussed with her that surgery will have to determine if it needs to be removed Return to clinic in 3 week for cycle 3    I discussed the assessment and treatment plan with the patient. The patient was provided an opportunity to ask questions and all were answered. The patient agreed with the plan and demonstrated an understanding of the instructions. The patient was advised to call back or seek an in-person evaluation if the symptoms worsen or if the condition fails to improve as anticipated.   Total time spent: 30 minutes including face-to-face MyChart video visit time and time spent for planning, charting and coordination of care  Rulon Eisenmenger, MD 08/03/2020   I, Cloyde Reams Dorshimer, am acting as scribe for Nicholas Lose, MD.  I have reviewed the above documentation for accuracy and completeness, and I agree with the above.

## 2020-08-03 ENCOUNTER — Inpatient Hospital Stay: Payer: BC Managed Care – PPO | Attending: Hematology and Oncology

## 2020-08-03 ENCOUNTER — Other Ambulatory Visit: Payer: Self-pay

## 2020-08-03 ENCOUNTER — Inpatient Hospital Stay: Payer: BC Managed Care – PPO | Admitting: Hematology and Oncology

## 2020-08-03 ENCOUNTER — Inpatient Hospital Stay: Payer: BC Managed Care – PPO

## 2020-08-03 ENCOUNTER — Ambulatory Visit: Payer: BC Managed Care – PPO

## 2020-08-03 ENCOUNTER — Encounter: Payer: Self-pay | Admitting: *Deleted

## 2020-08-03 VITALS — BP 103/47 | HR 64 | Temp 98.2°F | Resp 18

## 2020-08-03 DIAGNOSIS — C50811 Malignant neoplasm of overlapping sites of right female breast: Secondary | ICD-10-CM

## 2020-08-03 DIAGNOSIS — Z170 Estrogen receptor positive status [ER+]: Secondary | ICD-10-CM | POA: Insufficient documentation

## 2020-08-03 DIAGNOSIS — Z5111 Encounter for antineoplastic chemotherapy: Secondary | ICD-10-CM | POA: Diagnosis not present

## 2020-08-03 DIAGNOSIS — Z5189 Encounter for other specified aftercare: Secondary | ICD-10-CM | POA: Insufficient documentation

## 2020-08-03 DIAGNOSIS — Z95828 Presence of other vascular implants and grafts: Secondary | ICD-10-CM

## 2020-08-03 DIAGNOSIS — Z5112 Encounter for antineoplastic immunotherapy: Secondary | ICD-10-CM | POA: Insufficient documentation

## 2020-08-03 LAB — COMPREHENSIVE METABOLIC PANEL
ALT: 35 U/L (ref 0–44)
AST: 26 U/L (ref 15–41)
Albumin: 4 g/dL (ref 3.5–5.0)
Alkaline Phosphatase: 80 U/L (ref 38–126)
Anion gap: 7 (ref 5–15)
BUN: 18 mg/dL (ref 6–20)
CO2: 26 mmol/L (ref 22–32)
Calcium: 9.2 mg/dL (ref 8.9–10.3)
Chloride: 107 mmol/L (ref 98–111)
Creatinine, Ser: 0.73 mg/dL (ref 0.44–1.00)
GFR, Estimated: >60 mL/min (ref >60)
Glucose, Bld: 91 mg/dL (ref 70–99)
Potassium: 3.5 mmol/L (ref 3.5–5.1)
Sodium: 140 mmol/L (ref 135–145)
Total Bilirubin: 0.4 mg/dL (ref 0.3–1.2)
Total Protein: 7.1 g/dL (ref 6.5–8.1)

## 2020-08-03 LAB — CBC WITH DIFFERENTIAL/PLATELET
Abs Immature Granulocytes: 0.03 10*3/uL (ref 0.00–0.07)
Basophils Absolute: 0.1 10*3/uL (ref 0.0–0.1)
Basophils Relative: 2 %
Eosinophils Absolute: 0 10*3/uL (ref 0.0–0.5)
Eosinophils Relative: 0 %
HCT: 33.7 % — ABNORMAL LOW (ref 36.0–46.0)
Hemoglobin: 11.4 g/dL — ABNORMAL LOW (ref 12.0–15.0)
Immature Granulocytes: 1 %
Lymphocytes Relative: 37 %
Lymphs Abs: 1.8 10*3/uL (ref 0.7–4.0)
MCH: 31 pg (ref 26.0–34.0)
MCHC: 33.8 g/dL (ref 30.0–36.0)
MCV: 91.6 fL (ref 80.0–100.0)
Monocytes Absolute: 0.7 10*3/uL (ref 0.1–1.0)
Monocytes Relative: 15 %
Neutro Abs: 2.2 10*3/uL (ref 1.7–7.7)
Neutrophils Relative %: 45 %
Platelets: 247 10*3/uL (ref 150–400)
RBC: 3.68 MIL/uL — ABNORMAL LOW (ref 3.87–5.11)
RDW: 12.6 % (ref 11.5–15.5)
WBC: 4.9 10*3/uL (ref 4.0–10.5)
nRBC: 0 % (ref 0.0–0.2)

## 2020-08-03 MED ORDER — SODIUM CHLORIDE 0.9 % IV SOLN
600.0000 mg | Freq: Once | INTRAVENOUS | Status: AC
Start: 2020-08-03 — End: 2020-08-03
  Administered 2020-08-03: 600 mg via INTRAVENOUS
  Filled 2020-08-03: qty 60

## 2020-08-03 MED ORDER — HEPARIN SOD (PORK) LOCK FLUSH 100 UNIT/ML IV SOLN
500.0000 [IU] | Freq: Once | INTRAVENOUS | Status: AC | PRN
  Administered 2020-08-03: 500 [IU]
  Filled 2020-08-03: qty 5

## 2020-08-03 MED ORDER — TRASTUZUMAB-DKST CHEMO 150 MG IV SOLR
6.0000 mg/kg | Freq: Once | INTRAVENOUS | Status: AC
  Administered 2020-08-03: 399 mg via INTRAVENOUS
  Filled 2020-08-03: qty 19

## 2020-08-03 MED ORDER — SODIUM CHLORIDE 0.9% FLUSH
10.0000 mL | INTRAVENOUS | Status: DC | PRN
  Administered 2020-08-03: 10 mL via INTRAVENOUS
  Filled 2020-08-03: qty 10

## 2020-08-03 MED ORDER — SODIUM CHLORIDE 0.9 % IV SOLN
150.0000 mg | Freq: Once | INTRAVENOUS | Status: AC
  Administered 2020-08-03: 150 mg via INTRAVENOUS
  Filled 2020-08-03: qty 150

## 2020-08-03 MED ORDER — DIPHENHYDRAMINE HCL 25 MG PO CAPS
ORAL_CAPSULE | ORAL | Status: AC
  Filled 2020-08-03: qty 2

## 2020-08-03 MED ORDER — DIPHENHYDRAMINE HCL 25 MG PO CAPS
50.0000 mg | ORAL_CAPSULE | Freq: Once | ORAL | Status: AC
  Administered 2020-08-03: 50 mg via ORAL

## 2020-08-03 MED ORDER — PALONOSETRON HCL INJECTION 0.25 MG/5ML
INTRAVENOUS | Status: AC
  Filled 2020-08-03: qty 5

## 2020-08-03 MED ORDER — SODIUM CHLORIDE 0.9 % IV SOLN
420.0000 mg | Freq: Once | INTRAVENOUS | Status: AC
  Administered 2020-08-03: 420 mg via INTRAVENOUS
  Filled 2020-08-03: qty 14

## 2020-08-03 MED ORDER — ACETAMINOPHEN 325 MG PO TABS
650.0000 mg | ORAL_TABLET | Freq: Once | ORAL | Status: AC
  Administered 2020-08-03: 650 mg via ORAL

## 2020-08-03 MED ORDER — SODIUM CHLORIDE 0.9% FLUSH
10.0000 mL | INTRAVENOUS | Status: DC | PRN
  Administered 2020-08-03: 10 mL
  Filled 2020-08-03: qty 10

## 2020-08-03 MED ORDER — SODIUM CHLORIDE 0.9 % IV SOLN
Freq: Once | INTRAVENOUS | Status: AC
  Filled 2020-08-03: qty 250

## 2020-08-03 MED ORDER — PALONOSETRON HCL INJECTION 0.25 MG/5ML
0.2500 mg | Freq: Once | INTRAVENOUS | Status: AC
  Administered 2020-08-03: 0.25 mg via INTRAVENOUS

## 2020-08-03 MED ORDER — SODIUM CHLORIDE 0.9 % IV SOLN
10.0000 mg | Freq: Once | INTRAVENOUS | Status: AC
  Administered 2020-08-03: 10 mg via INTRAVENOUS
  Filled 2020-08-03: qty 10

## 2020-08-03 MED ORDER — SODIUM CHLORIDE 0.9 % IV SOLN
75.0000 mg/m2 | Freq: Once | INTRAVENOUS | Status: AC
  Administered 2020-08-03: 140 mg via INTRAVENOUS
  Filled 2020-08-03: qty 14

## 2020-08-03 MED ORDER — ACETAMINOPHEN 325 MG PO TABS
ORAL_TABLET | ORAL | Status: AC
  Filled 2020-08-03: qty 2

## 2020-08-03 NOTE — Patient Instructions (Signed)

## 2020-08-03 NOTE — Patient Instructions (Signed)
[facility] Discharge Instructions for Patients Receiving Chemotherapy  Today you received the following chemotherapy agents Trastuzumab, Pertuzumab, Docetaxel, Carboplatin,  To help prevent nausea and vomiting after your treatment, we encourage you to take your nausea medication as directed by MD    If you develop nausea and vomiting that is not controlled by your nausea medication, call the clinic.   BELOW ARE SYMPTOMS THAT SHOULD BE REPORTED IMMEDIATELY:  *FEVER GREATER THAN 100.5 F  *CHILLS WITH OR WITHOUT FEVER  NAUSEA AND VOMITING THAT IS NOT CONTROLLED WITH YOUR NAUSEA MEDICATION  *UNUSUAL SHORTNESS OF BREATH  *UNUSUAL BRUISING OR BLEEDING  TENDERNESS IN MOUTH AND THROAT WITH OR WITHOUT PRESENCE OF ULCERS  *URINARY PROBLEMS  *BOWEL PROBLEMS  UNUSUAL RASH Items with * indicate a potential emergency and should be followed up as soon as possible.  Feel free to call the clinic should you have any questions or concerns. The clinic phone number is [phone].  Please show the [place] at check-in to the Emergency Department and triage nurse.

## 2020-08-03 NOTE — Assessment & Plan Note (Signed)
06/21/2020:Patient palpated a right breast mass x 1 wk. Mammogram showed multiple confluent masses in the right breast at the 7 o'clock position measuring 2.3cm and at the 9 o'clock position measuring 2.2cm with one right axillary lymph node with cortical thickening. Biopsy showed invasive mammary carcinoma at both positions and in the axilla, grade 2-3, HER-2 equivocal by IHC (2+), positive by FISH (ratio 3.34), ER+ >95%, PR+ 25%, Ki67 60%.  Treatment plan: 1. Neoadjuvant chemotherapy with TCH Perjeta 6 cycles followed by HerceptinPerjeta versus Kadcylamaintenance for 1 year 2. Followed by breast conserving surgery if possible with sentinel lymph node study 3. Followed by adjuvant radiation therapy if patient had lumpectomy 4.Followed by adjuvant antiestrogen therapy 5.Followed by neratinib ---------------------------------------------------------------------------------------------------------------------------------- Current treatment: Cycle 2 TCH Perjeta Echocardiogram 07/06/2020: EF 60 to 65%  Chemo toxicities: 1.  Pelvic pain due to Neulasta Denies any nausea or vomiting 2. episode of constipation which required milk of magnesia Occasional loose stools  MRI guided biopsy of the left breast: Benign, papillomatous lesion cannot be ruled out.  I discussed with her that surgery will have to determine if it needs to be removed Return to clinic in 3 week for cycle 3

## 2020-08-03 NOTE — Progress Notes (Signed)
Nutrition Follow-up:  Patient with right breast cancer.  Patient receiving neoadjuvant chemotherapy.    Met with patient during infusion.  Patient reports appetite is typically down following treatment but then picks back up. She describes appetite as "normal" in the weeks following treatment.  Had avocado toast this am and requesting other breakfast ideas.      Medications: reviewed  Labs: reviewed  Anthropometrics:   Weight 148 lb 8 oz today relatively stable from 149 lb on 1/19   NUTRITION DIAGNOSIS: Food and nutrition related knowledge deficit improved   INTERVENTION:  Provided some breakfast ideas for patient.  Encouraged patient to continue with well balanced diet including good sources of protein Patient has contact information   NEXT VISIT: no follow-up RD available as needed  Markus Casten B. Zenia Resides, [place], [place] Registered Dietitian [phone] (mobile)

## 2020-08-05 ENCOUNTER — Other Ambulatory Visit: Payer: Self-pay

## 2020-08-05 ENCOUNTER — Telehealth: Payer: Self-pay | Admitting: Hematology and Oncology

## 2020-08-05 ENCOUNTER — Inpatient Hospital Stay: Payer: BC Managed Care – PPO

## 2020-08-05 ENCOUNTER — Encounter: Payer: Self-pay | Admitting: Hematology and Oncology

## 2020-08-05 VITALS — BP 120/71 | HR 61 | Resp 18

## 2020-08-05 DIAGNOSIS — C50811 Malignant neoplasm of overlapping sites of right female breast: Secondary | ICD-10-CM | POA: Diagnosis not present

## 2020-08-05 DIAGNOSIS — Z5111 Encounter for antineoplastic chemotherapy: Secondary | ICD-10-CM | POA: Diagnosis not present

## 2020-08-05 DIAGNOSIS — Z170 Estrogen receptor positive status [ER+]: Secondary | ICD-10-CM

## 2020-08-05 DIAGNOSIS — Z5189 Encounter for other specified aftercare: Secondary | ICD-10-CM | POA: Diagnosis not present

## 2020-08-05 DIAGNOSIS — Z5112 Encounter for antineoplastic immunotherapy: Secondary | ICD-10-CM | POA: Diagnosis not present

## 2020-08-05 MED ORDER — PEGFILGRASTIM-CBQV 6 MG/0.6ML ~~LOC~~ SOSY
PREFILLED_SYRINGE | SUBCUTANEOUS | Status: AC
  Filled 2020-08-05: qty 0.6

## 2020-08-05 MED ORDER — PEGFILGRASTIM-CBQV 6 MG/0.6ML ~~LOC~~ SOSY
6.0000 mg | PREFILLED_SYRINGE | Freq: Once | SUBCUTANEOUS | Status: AC
  Administered 2020-08-05: 6 mg via SUBCUTANEOUS

## 2020-08-05 NOTE — Patient Instructions (Signed)
Pegfilgrastim injection What is this medicine? PEGFILGRASTIM (PEG fil gra stim) is a long-acting granulocyte colony-stimulating factor that stimulates the growth of neutrophils, a type of white blood cell important in the body's fight against infection. It is used to reduce the incidence of fever and infection in patients with certain types of cancer who are receiving chemotherapy that affects the bone marrow, and to increase survival after being exposed to high doses of radiation. This medicine may be used for other purposes; ask your health care provider or pharmacist if you have questions. COMMON BRAND NAME(S): Fulphila, Neulasta, Nyvepria, UDENYCA, Ziextenzo What should I tell my health care provider before I take this medicine? They need to know if you have any of these conditions:  kidney disease  latex allergy  ongoing radiation therapy  sickle cell disease  skin reactions to acrylic adhesives (On-Body Injector only)  an unusual or allergic reaction to pegfilgrastim, filgrastim, other medicines, foods, dyes, or preservatives  pregnant or trying to get pregnant  breast-feeding How should I use this medicine? This medicine is for injection under the skin. If you get this medicine at home, you will be taught how to prepare and give the pre-filled syringe or how to use the On-body Injector. Refer to the patient Instructions for Use for detailed instructions. Use exactly as directed. Tell your healthcare provider immediately if you suspect that the On-body Injector may not have performed as intended or if you suspect the use of the On-body Injector resulted in a missed or partial dose. It is important that you put your used needles and syringes in a special sharps container. Do not put them in a trash can. If you do not have a sharps container, call your pharmacist or healthcare provider to get one. Talk to your pediatrician regarding the use of this medicine in children. While this drug  may be prescribed for selected conditions, precautions do apply. Overdosage: If you think you have taken too much of this medicine contact a poison control center or emergency room at once. NOTE: This medicine is only for you. Do not share this medicine with others. What if I miss a dose? It is important not to miss your dose. Call your doctor or health care professional if you miss your dose. If you miss a dose due to an On-body Injector failure or leakage, a new dose should be administered as soon as possible using a single prefilled syringe for manual use. What may interact with this medicine? Interactions have not been studied. This list may not describe all possible interactions. Give your health care provider a list of all the medicines, herbs, non-prescription drugs, or dietary supplements you use. Also tell them if you smoke, drink alcohol, or use illegal drugs. Some items may interact with your medicine. What should I watch for while using this medicine? Your condition will be monitored carefully while you are receiving this medicine. You may need blood work done while you are taking this medicine. Talk to your health care provider about your risk of cancer. You may be more at risk for certain types of cancer if you take this medicine. If you are going to need a MRI, CT scan, or other procedure, tell your doctor that you are using this medicine (On-Body Injector only). What side effects may I notice from receiving this medicine? Side effects that you should report to your doctor or health care professional as soon as possible:  allergic reactions (skin rash, itching or hives, swelling of   the face, lips, or tongue)  back pain  dizziness  fever  pain, redness, or irritation at site where injected  pinpoint red spots on the skin  red or dark-brown urine  shortness of breath or breathing problems  stomach or side pain, or pain at the shoulder  swelling  tiredness  trouble  passing urine or change in the amount of urine  unusual bruising or bleeding Side effects that usually do not require medical attention (report to your doctor or health care professional if they continue or are bothersome):  bone pain  muscle pain This list may not describe all possible side effects. Call your doctor for medical advice about side effects. You may report side effects to FDA at 1-800-FDA-1088. Where should I keep my medicine? Keep out of the reach of children. If you are using this medicine at home, you will be instructed on how to store it. Throw away any unused medicine after the expiration date on the label. NOTE: This sheet is a summary. It may not cover all possible information. If you have questions about this medicine, talk to your doctor, pharmacist, or health care provider.  2021 Elsevier/Gold Standard (2019-07-03 13:20:51)  

## 2020-08-05 NOTE — Progress Notes (Signed)
Met with patient at registration to introduce myself as Financial Resource Specialist and to offer available resources.  Discussed one-time $1000 [employer] grant and qualifications to assist with personal expenses while going through treatment. Also advised of available copay assistance if she has not met her OOP for the year.  Gave her my card if interested in applying and for any additional financial questions or concerns. 

## 2020-08-05 NOTE — Telephone Encounter (Signed)
Scheduled per 2/9 los. Pt will receive an updated appt calendar per next visit appt notes 

## 2020-08-09 ENCOUNTER — Encounter: Payer: Self-pay | Admitting: *Deleted

## 2020-08-09 NOTE — Research (Signed)
DCP-001 Use of a Clinical Trial Screening Tool to Address Cancer Health Disparities in the [facility] Program  07/21/2020  The DCP-001 study was discussed with the patient because she was approached for a clinical trial.  The study was discussed including its voluntary nature.  She declined to participate in this study.  The patient was thanked for her time.    [place] Coordinator I

## 2020-08-18 DIAGNOSIS — E049 Nontoxic goiter, unspecified: Secondary | ICD-10-CM | POA: Diagnosis not present

## 2020-08-18 DIAGNOSIS — E042 Nontoxic multinodular goiter: Secondary | ICD-10-CM | POA: Diagnosis not present

## 2020-08-19 ENCOUNTER — Ambulatory Visit: Payer: BC Managed Care – PPO

## 2020-08-21 DIAGNOSIS — R309 Painful micturition, unspecified: Secondary | ICD-10-CM | POA: Diagnosis not present

## 2020-08-22 ENCOUNTER — Other Ambulatory Visit: Payer: Self-pay | Admitting: *Deleted

## 2020-08-22 ENCOUNTER — Ambulatory Visit: Payer: BC Managed Care – PPO | Admitting: Hematology and Oncology

## 2020-08-22 ENCOUNTER — Other Ambulatory Visit: Payer: BC Managed Care – PPO

## 2020-08-22 ENCOUNTER — Ambulatory Visit: Payer: BC Managed Care – PPO

## 2020-08-22 NOTE — Progress Notes (Signed)
Received after hours message from pt with complaint of UTI symptoms.  RN placed call to pt this AM who states she went to urgent care yesterday and was prescribed Macrobid abx for UTI symptoms.  MD notified and verbalized understanding and states pt needs to continue with that course of tx.  Pt verbalized understanding.

## 2020-08-23 NOTE — Assessment & Plan Note (Signed)
06/21/2020:Patient palpated a right breast mass x 1 wk. Mammogram showed multiple confluent masses in the right breast at the 7 o'clock position measuring 2.3cm and at the 9 o'clock position measuring 2.2cm with one right axillary lymph node with cortical thickening. Biopsy showed invasive mammary carcinoma at both positions and in the axilla, grade 2-3, HER-2 equivocal by IHC (2+), positive by FISH (ratio 3.34), ER+ >95%, PR+ 25%, Ki67 60%.  Treatment plan: 1. Neoadjuvant chemotherapy with TCH Perjeta 6 cycles followed by HerceptinPerjeta versus Kadcylamaintenance for 1 year 2. Followed by breast conserving surgery if possible with sentinel lymph node study 3. Followed by adjuvant radiation therapy if patient had lumpectomy 4.Followed by adjuvant antiestrogen therapy 5.Followed by neratinib ---------------------------------------------------------------------------------------------------------------------------------- Current treatment: Cycle 3 TCH Perjeta Echocardiogram 07/06/2020: EF 60 to 65%  Chemo toxicities: 1.Pelvic pain due to Neulasta (3 days of pain) 2.episode of constipation which required milk of magnesia Occasional loose stools  MRI guided biopsy of the left breast: Benign, papillomatous lesion cannot be ruled out.I discussed with her that surgery will have to determine if it needs to be removed Return to clinic in 3 week for cycle 4  

## 2020-08-23 NOTE — Progress Notes (Signed)
Patient Care Team: Wenda Low, MD as PCP - General (Internal Medicine) Rockwell Germany, RN as Oncology Nurse Navigator Mauro Kaufmann, RN as Oncology Nurse Navigator [name], MD as Consulting Physician (General Surgery) Nicholas Lose, MD as Consulting Physician (Hematology and Oncology) Kyung Rudd, MD as Consulting Physician (Radiation Oncology)  DIAGNOSIS:    ICD-10-CM   1. Malignant neoplasm of overlapping sites of right breast in female, estrogen receptor positive ([place])  C50.811    Z17.0     SUMMARY OF ONCOLOGIC HISTORY: Oncology History  Malignant neoplasm of overlapping sites of right breast in female, estrogen receptor positive ([place])  06/21/2020 Initial Diagnosis   Patient palpated a right breast mass x 1 wk. Mammogram showed multiple confluent masses in the right breast at the 7 o'clock position measuring 2.3cm and at the 9 o'clock position measuring 2.2cm with one right axillary lymph node with cortical thickening. Biopsy showed invasive mammary carcinoma at both positions and in the axilla, grade 2-3, HER-2 equivocal by IHC (2+), positive by FISH (ratio 3.34), ER+ >95%, PR+ 25%, Ki67 60%.   06/29/2020 Cancer Staging   Staging form: Breast, AJCC 8th Edition - Clinical stage from 06/29/2020: Stage IB (cT2, cN1(f), cM0, G3, ER+, PR+, HER2+) - Signed by Nicholas Lose, MD on 06/29/2020   07/13/2020 -  Chemotherapy    Patient is on Treatment Plan: BREAST  DOCETAXEL + CARBOPLATIN + TRASTUZUMAB + PERTUZUMAB  (TCHP) Q21D         CHIEF COMPLIANT: Cycle 3 Day1TCH Perjeta  INTERVAL HISTORY: Teresa Sheppard is a 60 y.o. with above-mentioned history of invasive mammary carcinoma of the right breast currently on neoadjuvant chemotherapy with [place]. She presents to the clinic todayfora toxicity check and cycle 3. She had 1 week of fatigue but otherwise tolerated last cycle of chemo very well.  Does not have neuropathy.  Denies nausea vomiting.  ALLERGIES:  is  allergic to penicillins.  MEDICATIONS:  Current Outpatient Medications  Medication Sig Dispense Refill  . Acetaminophen (TYLENOL) 325 MG CAPS Take 162.5-325 mg by mouth every 6 (six) hours as needed (headaches / pain).    . cetirizine (ZYRTEC) 10 MG tablet Take 10 mg by mouth daily as needed for allergies.    . cholecalciferol (VITAMIN D3) 25 MCG (1000 UNIT) tablet Take 1,000 Units by mouth daily.    Marland Kitchen dexamethasone (DECADRON) 4 MG tablet Take 1 tablet (4 mg total) by mouth 2 (two) times daily. Take 1 tablet day before chemo and 1 tablet a day after chemo with food (Patient taking differently: Take 4 mg by mouth See admin instructions. Take 1 tablet day before chemo and 1 tablet a day after chemo with food) 12 tablet 0  . fluticasone (FLONASE) 50 MCG/ACT nasal spray Place 1 spray into both nostrils daily as needed for allergies or rhinitis.    Marland Kitchen lidocaine-prilocaine (EMLA) cream Apply to affected area once (Patient taking differently: Apply 1 application topically daily as needed (port access).) 30 g 3  . LORazepam (ATIVAN) 0.5 MG tablet Take 1 tablet (0.5 mg total) by mouth at bedtime. As needed 30 tablet 1  . Omega-3 Fatty Acids (FISH OIL) 1000 MG CAPS Take 1,000 mg by mouth 3 (three) times a week.    . ondansetron (ZOFRAN) 8 MG tablet Take 1 tablet (8 mg total) by mouth 2 (two) times daily as needed (Nausea or vomiting). Start on the third day after chemotherapy. 30 tablet 1  . Polyethyl Glycol-Propyl Glycol (SYSTANE OP) Place  1 drop into both eyes daily as needed (dry eyes).    . prochlorperazine (COMPAZINE) 10 MG tablet Take 1 tablet (10 mg total) by mouth every 6 (six) hours as needed (Nausea or vomiting). 30 tablet 1   No current facility-administered medications for this visit.   Facility-Administered Medications Ordered in Other Visits  Medication Dose Route Frequency Provider Last Rate Last Admin  . CARBOplatin (PARAPLATIN) 630 mg in sodium chloride 0.9 % 250 mL chemo infusion  630 mg  Intravenous Once Nicholas Lose, MD      . DOCEtaxel (TAXOTERE) 140 mg in sodium chloride 0.9 % 250 mL chemo infusion  75 mg/m2 (Treatment Plan Recorded) Intravenous Once Nicholas Lose, MD      . heparin lock flush 100 unit/mL  500 Units Intracatheter Once PRN Nicholas Lose, MD      . sodium chloride flush (NS) 0.9 % injection 10 mL  10 mL Intracatheter PRN Nicholas Lose, MD        PHYSICAL EXAMINATION: ECOG PERFORMANCE STATUS: 1 - Symptomatic but completely ambulatory  Vitals:   08/24/20 0819  BP: 115/61  Pulse: 66  Resp: 20  Temp: (!) 97.5 F (36.4 C)  SpO2: 100%   Filed Weights   08/24/20 0819  Weight: 150 lb 8 oz (68.3 kg)    LABORATORY DATA:  I have reviewed the data as listed CMP Latest Ref Rng & Units 08/24/2020 08/03/2020 07/21/2020  Glucose 70 - 99 mg/dL 77 91 128(H)  BUN 6 - 20 mg/dL 17 18 19   Creatinine 0.44 - 1.00 mg/dL 0.71 0.73 0.80  Sodium 135 - 145 mmol/L 142 140 135  Potassium 3.5 - 5.1 mmol/L 3.4(L) 3.5 3.7  Chloride 98 - 111 mmol/L 107 107 101  CO2 22 - 32 mmol/L 24 26 28   Calcium 8.9 - 10.3 mg/dL 9.3 9.2 9.4  Total Protein 6.5 - 8.1 g/dL 7.1 7.1 7.0  Total Bilirubin 0.3 - 1.2 mg/dL 0.4 0.4 <0.2(L)  Alkaline Phos 38 - 126 U/L 83 80 151(H)  AST 15 - 41 U/L 25 26 38  ALT 0 - 44 U/L 28 35 29    Lab Results  Component Value Date   WBC 6.8 08/24/2020   HGB 11.0 (L) 08/24/2020   HCT 33.9 (L) 08/24/2020   MCV 94.7 08/24/2020   PLT 193 08/24/2020   NEUTROABS 4.4 08/24/2020    ASSESSMENT & PLAN:  Malignant neoplasm of overlapping sites of right breast in female, estrogen receptor positive ([place]) 06/21/2020:Patient palpated a right breast mass x 1 wk. Mammogram showed multiple confluent masses in the right breast at the 7 o'clock position measuring 2.3cm and at the 9 o'clock position measuring 2.2cm with one right axillary lymph node with cortical thickening. Biopsy showed invasive mammary carcinoma at both positions and in the axilla, grade 2-3, HER-2 equivocal  by IHC (2+), positive by FISH (ratio 3.34), ER+ >95%, PR+ 25%, Ki67 60%.  Treatment plan: 1. Neoadjuvant chemotherapy with TCH Perjeta 6 cycles followed by HerceptinPerjeta versus Kadcylamaintenance for 1 year 2. Followed by breast conserving surgery if possible with sentinel lymph node study 3. Followed by adjuvant radiation therapy if patient had lumpectomy 4.Followed by adjuvant antiestrogen therapy 5.Followed by neratinib ---------------------------------------------------------------------------------------------------------------------------------- Current treatment: Cycle 3 TCH Perjeta Echocardiogram 07/06/2020: EF 60 to 65%  Chemo toxicities: 1.Pelvic pain due to Neulasta : Resolved 2.episode of constipation which required milk of magnesia Occasional loose stools  MRI guided biopsy of the left breast: Benign, papillomatous lesion cannot be ruled out.I discussed with  her that surgery will have to determine if it needs to be removed Return to clinic in 3 week for cycle 4     No orders of the defined types were placed in this encounter.  The patient has a good understanding of the overall plan. she agrees with it. she will call with any problems that may develop before the next visit here.  Total time spent: 30 mins including face to face time and time spent for planning, charting and coordination of care  Rulon Eisenmenger, MD, MPH 08/24/2020  I, Cloyde Reams Dorshimer, am acting as scribe for Dr. Nicholas Lose.  I have reviewed the above documentation for accuracy and completeness, and I agree with the above.

## 2020-08-24 ENCOUNTER — Ambulatory Visit: Payer: BC Managed Care – PPO

## 2020-08-24 ENCOUNTER — Other Ambulatory Visit: Payer: Self-pay

## 2020-08-24 ENCOUNTER — Inpatient Hospital Stay: Payer: BC Managed Care – PPO | Attending: Hematology and Oncology

## 2020-08-24 ENCOUNTER — Inpatient Hospital Stay: Payer: BC Managed Care – PPO | Admitting: Hematology and Oncology

## 2020-08-24 ENCOUNTER — Encounter: Payer: Self-pay | Admitting: *Deleted

## 2020-08-24 ENCOUNTER — Inpatient Hospital Stay: Payer: BC Managed Care – PPO

## 2020-08-24 ENCOUNTER — Telehealth: Payer: Self-pay | Admitting: *Deleted

## 2020-08-24 DIAGNOSIS — Z5112 Encounter for antineoplastic immunotherapy: Secondary | ICD-10-CM | POA: Insufficient documentation

## 2020-08-24 DIAGNOSIS — Z5189 Encounter for other specified aftercare: Secondary | ICD-10-CM | POA: Insufficient documentation

## 2020-08-24 DIAGNOSIS — Z5111 Encounter for antineoplastic chemotherapy: Secondary | ICD-10-CM | POA: Diagnosis not present

## 2020-08-24 DIAGNOSIS — Z95828 Presence of other vascular implants and grafts: Secondary | ICD-10-CM

## 2020-08-24 DIAGNOSIS — C50811 Malignant neoplasm of overlapping sites of right female breast: Secondary | ICD-10-CM

## 2020-08-24 DIAGNOSIS — Z170 Estrogen receptor positive status [ER+]: Secondary | ICD-10-CM | POA: Diagnosis not present

## 2020-08-24 LAB — CBC WITH DIFFERENTIAL/PLATELET
Abs Immature Granulocytes: 0.02 10*3/uL (ref 0.00–0.07)
Basophils Absolute: 0.1 10*3/uL (ref 0.0–0.1)
Basophils Relative: 1 %
Eosinophils Absolute: 0 10*3/uL (ref 0.0–0.5)
Eosinophils Relative: 1 %
HCT: 33.9 % — ABNORMAL LOW (ref 36.0–46.0)
Hemoglobin: 11 g/dL — ABNORMAL LOW (ref 12.0–15.0)
Immature Granulocytes: 0 %
Lymphocytes Relative: 19 %
Lymphs Abs: 1.3 10*3/uL (ref 0.7–4.0)
MCH: 30.7 pg (ref 26.0–34.0)
MCHC: 32.4 g/dL (ref 30.0–36.0)
MCV: 94.7 fL (ref 80.0–100.0)
Monocytes Absolute: 1 10*3/uL (ref 0.1–1.0)
Monocytes Relative: 15 %
Neutro Abs: 4.4 10*3/uL (ref 1.7–7.7)
Neutrophils Relative %: 64 %
Platelets: 193 10*3/uL (ref 150–400)
RBC: 3.58 MIL/uL — ABNORMAL LOW (ref 3.87–5.11)
RDW: 14.1 % (ref 11.5–15.5)
WBC: 6.8 10*3/uL (ref 4.0–10.5)
nRBC: 0 % (ref 0.0–0.2)

## 2020-08-24 LAB — COMPREHENSIVE METABOLIC PANEL
ALT: 28 U/L (ref 0–44)
AST: 25 U/L (ref 15–41)
Albumin: 4.1 g/dL (ref 3.5–5.0)
Alkaline Phosphatase: 83 U/L (ref 38–126)
Anion gap: 11 (ref 5–15)
BUN: 17 mg/dL (ref 6–20)
CO2: 24 mmol/L (ref 22–32)
Calcium: 9.3 mg/dL (ref 8.9–10.3)
Chloride: 107 mmol/L (ref 98–111)
Creatinine, Ser: 0.71 mg/dL (ref 0.44–1.00)
GFR, Estimated: >60 mL/min (ref >60)
Glucose, Bld: 77 mg/dL (ref 70–99)
Potassium: 3.4 mmol/L — ABNORMAL LOW (ref 3.5–5.1)
Sodium: 142 mmol/L (ref 135–145)
Total Bilirubin: 0.4 mg/dL (ref 0.3–1.2)
Total Protein: 7.1 g/dL (ref 6.5–8.1)

## 2020-08-24 MED ORDER — SODIUM CHLORIDE 0.9 % IV SOLN
75.0000 mg/m2 | Freq: Once | INTRAVENOUS | Status: AC
  Administered 2020-08-24: 140 mg via INTRAVENOUS
  Filled 2020-08-24: qty 14

## 2020-08-24 MED ORDER — FOSAPREPITANT DIMEGLUMINE INJECTION 150 MG
150.0000 mg | Freq: Once | INTRAVENOUS | Status: AC
  Administered 2020-08-24: 150 mg via INTRAVENOUS
  Filled 2020-08-24: qty 150

## 2020-08-24 MED ORDER — PALONOSETRON HCL INJECTION 0.25 MG/5ML
INTRAVENOUS | Status: AC
  Filled 2020-08-24: qty 5

## 2020-08-24 MED ORDER — DIPHENHYDRAMINE HCL 25 MG PO CAPS
50.0000 mg | ORAL_CAPSULE | Freq: Once | ORAL | Status: AC
  Administered 2020-08-24: 50 mg via ORAL

## 2020-08-24 MED ORDER — DIPHENHYDRAMINE HCL 25 MG PO CAPS
ORAL_CAPSULE | ORAL | Status: AC
  Filled 2020-08-24: qty 2

## 2020-08-24 MED ORDER — PALONOSETRON HCL INJECTION 0.25 MG/5ML
0.2500 mg | Freq: Once | INTRAVENOUS | Status: AC
  Administered 2020-08-24: 0.25 mg via INTRAVENOUS

## 2020-08-24 MED ORDER — SODIUM CHLORIDE 0.9 % IV SOLN
6.0000 mg/kg | Freq: Once | INTRAVENOUS | Status: AC
Start: 2020-08-24 — End: 2020-08-24
  Administered 2020-08-24: 399 mg via INTRAVENOUS
  Filled 2020-08-24: qty 19

## 2020-08-24 MED ORDER — SODIUM CHLORIDE 0.9 % IV SOLN
420.0000 mg | Freq: Once | INTRAVENOUS | Status: AC
  Administered 2020-08-24: 420 mg via INTRAVENOUS
  Filled 2020-08-24: qty 14

## 2020-08-24 MED ORDER — ACETAMINOPHEN 325 MG PO TABS
650.0000 mg | ORAL_TABLET | Freq: Once | ORAL | Status: AC
  Administered 2020-08-24: 650 mg via ORAL

## 2020-08-24 MED ORDER — SODIUM CHLORIDE 0.9 % IV SOLN
634.2000 mg | Freq: Once | INTRAVENOUS | Status: AC
  Administered 2020-08-24: 630 mg via INTRAVENOUS
  Filled 2020-08-24: qty 63

## 2020-08-24 MED ORDER — ACETAMINOPHEN 325 MG PO TABS
ORAL_TABLET | ORAL | Status: AC
  Filled 2020-08-24: qty 2

## 2020-08-24 MED ORDER — SODIUM CHLORIDE 0.9% FLUSH
10.0000 mL | INTRAVENOUS | Status: DC | PRN
  Administered 2020-08-24: 10 mL
  Filled 2020-08-24: qty 10

## 2020-08-24 MED ORDER — SODIUM CHLORIDE 0.9 % IV SOLN
10.0000 mg | Freq: Once | INTRAVENOUS | Status: AC
  Administered 2020-08-24: 10 mg via INTRAVENOUS
  Filled 2020-08-24: qty 10

## 2020-08-24 MED ORDER — SODIUM CHLORIDE 0.9 % IV SOLN
Freq: Once | INTRAVENOUS | Status: AC
Start: 2020-08-24 — End: 2020-08-24
  Filled 2020-08-24: qty 250

## 2020-08-24 MED ORDER — HEPARIN SOD (PORK) LOCK FLUSH 100 UNIT/ML IV SOLN
500.0000 [IU] | Freq: Once | INTRAVENOUS | Status: AC | PRN
  Administered 2020-08-24: 500 [IU]
  Filled 2020-08-24: qty 5

## 2020-08-24 MED ORDER — DIPHENHYDRAMINE HCL 25 MG PO CAPS
ORAL_CAPSULE | ORAL | Status: AC
  Filled 2020-08-24: qty 1

## 2020-08-24 MED ORDER — SODIUM CHLORIDE 0.9% FLUSH
10.0000 mL | Freq: Once | INTRAVENOUS | Status: AC
  Administered 2020-08-24: 10 mL via INTRAVENOUS
  Filled 2020-08-24: qty 10

## 2020-08-24 NOTE — Patient Instructions (Signed)
[facility] Discharge Instructions for Patients Receiving Chemotherapy  Today you received the following chemotherapy agents Trastuzumab, Pertuzumab, Docetaxel, Carboplatin,  To help prevent nausea and vomiting after your treatment, we encourage you to take your nausea medication as directed by MD    If you develop nausea and vomiting that is not controlled by your nausea medication, call the clinic.   BELOW ARE SYMPTOMS THAT SHOULD BE REPORTED IMMEDIATELY:  *FEVER GREATER THAN 100.5 F  *CHILLS WITH OR WITHOUT FEVER  NAUSEA AND VOMITING THAT IS NOT CONTROLLED WITH YOUR NAUSEA MEDICATION  *UNUSUAL SHORTNESS OF BREATH  *UNUSUAL BRUISING OR BLEEDING  TENDERNESS IN MOUTH AND THROAT WITH OR WITHOUT PRESENCE OF ULCERS  *URINARY PROBLEMS  *BOWEL PROBLEMS  UNUSUAL RASH Items with * indicate a potential emergency and should be followed up as soon as possible.  Feel free to call the clinic should you have any questions or concerns. The clinic phone number is [phone].  Please show the [place] at check-in to the Emergency Department and triage nurse.

## 2020-08-24 NOTE — Patient Instructions (Signed)

## 2020-08-24 NOTE — Telephone Encounter (Signed)
Received call from pt with complaint of neuropathy symptoms in bilateral hands and feet.  Pt states she experiencing intermittent tingling in her fingers and toes.  States symptoms do not interfere with ADL's at this time. Pt next tx is scheduled for 09/15/20.  Per MD pt to monitor symptoms over the next 3 weeks and alert our office if symptoms become worse. Will re assess with next tx if dose of Taxotere needs to be decreased.  Pt verbalized understanding.

## 2020-08-25 ENCOUNTER — Telehealth: Payer: Self-pay | Admitting: Hematology and Oncology

## 2020-08-25 NOTE — Telephone Encounter (Signed)
Scheduled per 3/2 los. Pt will receive an updated appt calendar per next visit appt notes

## 2020-08-26 ENCOUNTER — Inpatient Hospital Stay: Payer: BC Managed Care – PPO

## 2020-08-26 ENCOUNTER — Other Ambulatory Visit: Payer: Self-pay

## 2020-08-26 VITALS — BP 123/57 | HR 65 | Temp 97.9°F

## 2020-08-26 DIAGNOSIS — Z5189 Encounter for other specified aftercare: Secondary | ICD-10-CM | POA: Diagnosis not present

## 2020-08-26 DIAGNOSIS — Z5112 Encounter for antineoplastic immunotherapy: Secondary | ICD-10-CM | POA: Diagnosis not present

## 2020-08-26 DIAGNOSIS — C50811 Malignant neoplasm of overlapping sites of right female breast: Secondary | ICD-10-CM

## 2020-08-26 DIAGNOSIS — Z170 Estrogen receptor positive status [ER+]: Secondary | ICD-10-CM

## 2020-08-26 DIAGNOSIS — Z5111 Encounter for antineoplastic chemotherapy: Secondary | ICD-10-CM | POA: Diagnosis not present

## 2020-08-26 MED ORDER — PEGFILGRASTIM-CBQV 6 MG/0.6ML ~~LOC~~ SOSY
6.0000 mg | PREFILLED_SYRINGE | Freq: Once | SUBCUTANEOUS | Status: AC
  Administered 2020-08-26: 6 mg via SUBCUTANEOUS

## 2020-08-26 MED ORDER — PEGFILGRASTIM-CBQV 6 MG/0.6ML ~~LOC~~ SOSY
PREFILLED_SYRINGE | SUBCUTANEOUS | Status: AC
  Filled 2020-08-26: qty 0.6

## 2020-08-26 NOTE — Patient Instructions (Signed)
Pegfilgrastim injection What is this medicine? PEGFILGRASTIM (PEG fil gra stim) is a long-acting granulocyte colony-stimulating factor that stimulates the growth of neutrophils, a type of white blood cell important in the body's fight against infection. It is used to reduce the incidence of fever and infection in patients with certain types of cancer who are receiving chemotherapy that affects the bone marrow, and to increase survival after being exposed to high doses of radiation. This medicine may be used for other purposes; ask your health care provider or pharmacist if you have questions. COMMON BRAND NAME(S): Fulphila, Neulasta, Nyvepria, UDENYCA, Ziextenzo What should I tell my health care provider before I take this medicine? They need to know if you have any of these conditions:  kidney disease  latex allergy  ongoing radiation therapy  sickle cell disease  skin reactions to acrylic adhesives (On-Body Injector only)  an unusual or allergic reaction to pegfilgrastim, filgrastim, other medicines, foods, dyes, or preservatives  pregnant or trying to get pregnant  breast-feeding How should I use this medicine? This medicine is for injection under the skin. If you get this medicine at home, you will be taught how to prepare and give the pre-filled syringe or how to use the On-body Injector. Refer to the patient Instructions for Use for detailed instructions. Use exactly as directed. Tell your healthcare provider immediately if you suspect that the On-body Injector may not have performed as intended or if you suspect the use of the On-body Injector resulted in a missed or partial dose. It is important that you put your used needles and syringes in a special sharps container. Do not put them in a trash can. If you do not have a sharps container, call your pharmacist or healthcare provider to get one. Talk to your pediatrician regarding the use of this medicine in children. While this drug  may be prescribed for selected conditions, precautions do apply. Overdosage: If you think you have taken too much of this medicine contact a poison control center or emergency room at once. NOTE: This medicine is only for you. Do not share this medicine with others. What if I miss a dose? It is important not to miss your dose. Call your doctor or health care professional if you miss your dose. If you miss a dose due to an On-body Injector failure or leakage, a new dose should be administered as soon as possible using a single prefilled syringe for manual use. What may interact with this medicine? Interactions have not been studied. This list may not describe all possible interactions. Give your health care provider a list of all the medicines, herbs, non-prescription drugs, or dietary supplements you use. Also tell them if you smoke, drink alcohol, or use illegal drugs. Some items may interact with your medicine. What should I watch for while using this medicine? Your condition will be monitored carefully while you are receiving this medicine. You may need blood work done while you are taking this medicine. Talk to your health care provider about your risk of cancer. You may be more at risk for certain types of cancer if you take this medicine. If you are going to need a MRI, CT scan, or other procedure, tell your doctor that you are using this medicine (On-Body Injector only). What side effects may I notice from receiving this medicine? Side effects that you should report to your doctor or health care professional as soon as possible:  allergic reactions (skin rash, itching or hives, swelling of   the face, lips, or tongue)  back pain  dizziness  fever  pain, redness, or irritation at site where injected  pinpoint red spots on the skin  red or dark-brown urine  shortness of breath or breathing problems  stomach or side pain, or pain at the shoulder  swelling  tiredness  trouble  passing urine or change in the amount of urine  unusual bruising or bleeding Side effects that usually do not require medical attention (report to your doctor or health care professional if they continue or are bothersome):  bone pain  muscle pain This list may not describe all possible side effects. Call your doctor for medical advice about side effects. You may report side effects to FDA at 1-800-FDA-1088. Where should I keep my medicine? Keep out of the reach of children. If you are using this medicine at home, you will be instructed on how to store it. Throw away any unused medicine after the expiration date on the label. NOTE: This sheet is a summary. It may not cover all possible information. If you have questions about this medicine, talk to your doctor, pharmacist, or health care provider.  2021 Elsevier/Gold Standard (2019-07-03 13:20:51)  

## 2020-08-30 DIAGNOSIS — H43391 Other vitreous opacities, right eye: Secondary | ICD-10-CM | POA: Diagnosis not present

## 2020-08-30 DIAGNOSIS — H25813 Combined forms of age-related cataract, bilateral: Secondary | ICD-10-CM | POA: Diagnosis not present

## 2020-08-30 DIAGNOSIS — G43B1 Ophthalmoplegic migraine, intractable: Secondary | ICD-10-CM | POA: Diagnosis not present

## 2020-08-30 DIAGNOSIS — H5319 Other subjective visual disturbances: Secondary | ICD-10-CM | POA: Diagnosis not present

## 2020-08-30 DIAGNOSIS — H5213 Myopia, bilateral: Secondary | ICD-10-CM | POA: Diagnosis not present

## 2020-08-31 ENCOUNTER — Telehealth: Payer: Self-pay

## 2020-08-31 ENCOUNTER — Telehealth: Payer: Self-pay | Admitting: Emergency Medicine

## 2020-08-31 ENCOUNTER — Encounter: Payer: Self-pay | Admitting: *Deleted

## 2020-08-31 NOTE — Telephone Encounter (Signed)
RN spoke with patient regarding concerns for hemorrhoids.    Pt denies constipation, or blood with bowel movements.  Pt using OTC lubrication, denies any major discomfort.    RN encouraged use of sitz bath, and encouraged patient to monitor for bleeding and if constipation develops to utilize stool softeners.  Pt verbalized understanding.   Pt reports concerns with mild neuropathy.  Pt is utilizing cryotherapy during treatments.  Pt is able to pick up objects, and states, "it's very mild, but I know it's a side effect."   RN provided information about neuropathy symptoms, and informed patient that this will be reviewed with MD at upcoming appointment prior to next treatment.  Pt verbalized understanding and agreement.

## 2020-08-31 NOTE — Telephone Encounter (Signed)
DCP-001 Use of a Clinical Trial Screening Tool to Address Cancer Health Disparities in the [facility] Program  Patient called me back to confirm that she did not want to participate in the DCP-001 study at this time due to privacy concerns.  The patient was thanked for her time and her return call.  Clabe Seal Clinical Research Coordinator I  08/31/20  9:38 AM

## 2020-09-13 NOTE — Progress Notes (Signed)
Patient Care Team: Wenda Low, MD as PCP - General (Internal Medicine) Rockwell Germany, RN as Oncology Nurse Navigator Mauro Kaufmann, RN as Oncology Nurse Navigator [name], MD as Consulting Physician (General Surgery) Nicholas Lose, MD as Consulting Physician (Hematology and Oncology) Kyung Rudd, MD as Consulting Physician (Radiation Oncology)  DIAGNOSIS:    ICD-10-CM   1. Malignant neoplasm of overlapping sites of right breast in female, estrogen receptor positive ([place])  C50.811    Z17.0     SUMMARY OF ONCOLOGIC HISTORY: Oncology History  Malignant neoplasm of overlapping sites of right breast in female, estrogen receptor positive ([place])  06/21/2020 Initial Diagnosis   Patient palpated a right breast mass x 1 wk. Mammogram showed multiple confluent masses in the right breast at the 7 o'clock position measuring 2.3cm and at the 9 o'clock position measuring 2.2cm with one right axillary lymph node with cortical thickening. Biopsy showed invasive mammary carcinoma at both positions and in the axilla, grade 2-3, HER-2 equivocal by IHC (2+), positive by FISH (ratio 3.34), ER+ >95%, PR+ 25%, Ki67 60%.   06/29/2020 Cancer Staging   Staging form: Breast, AJCC 8th Edition - Clinical stage from 06/29/2020: Stage IB (cT2, cN1(f), cM0, G3, ER+, PR+, HER2+) - Signed by Nicholas Lose, MD on 06/29/2020   07/13/2020 -  Chemotherapy    Patient is on Treatment Plan: BREAST  DOCETAXEL + CARBOPLATIN + TRASTUZUMAB + PERTUZUMAB  (TCHP) Q21D         CHIEF COMPLIANT: Cycle4Day1TCH Perjeta  INTERVAL HISTORY: Teresa Sheppard is a 60 y.o. with above-mentioned history of invasive mammary carcinoma of the right breast currently on neoadjuvant chemotherapy with [place]. She presents to the clinic todayfora toxicity checkandcycle4. Overall she is feeling more tired because of chemo.  Is also noticed abnormal sensation on the bottom of her feet as well as a few of her fingers.  It is  much better than what she had right after the treatment.  She denies any diarrhea or nausea or vomiting.  ALLERGIES:  is allergic to penicillins.  MEDICATIONS:  Current Outpatient Medications  Medication Sig Dispense Refill  . Acetaminophen (TYLENOL) 325 MG CAPS Take 162.5-325 mg by mouth every 6 (six) hours as needed (headaches / pain).    . cetirizine (ZYRTEC) 10 MG tablet Take 10 mg by mouth daily as needed for allergies.    . cholecalciferol (VITAMIN D3) 25 MCG (1000 UNIT) tablet Take 1,000 Units by mouth daily.    Marland Kitchen dexamethasone (DECADRON) 4 MG tablet Take 1 tablet (4 mg total) by mouth 2 (two) times daily. Take 1 tablet day before chemo and 1 tablet a day after chemo with food (Patient taking differently: Take 4 mg by mouth See admin instructions. Take 1 tablet day before chemo and 1 tablet a day after chemo with food) 12 tablet 0  . fluticasone (FLONASE) 50 MCG/ACT nasal spray Place 1 spray into both nostrils daily as needed for allergies or rhinitis.    Marland Kitchen lidocaine-prilocaine (EMLA) cream Apply to affected area once (Patient taking differently: Apply 1 application topically daily as needed (port access).) 30 g 3  . LORazepam (ATIVAN) 0.5 MG tablet Take 1 tablet (0.5 mg total) by mouth at bedtime. As needed 30 tablet 1  . Omega-3 Fatty Acids (FISH OIL) 1000 MG CAPS Take 1,000 mg by mouth 3 (three) times a week.    . ondansetron (ZOFRAN) 8 MG tablet Take 1 tablet (8 mg total) by mouth 2 (two) times daily as  needed (Nausea or vomiting). Start on the third day after chemotherapy. 30 tablet 1  . Polyethyl Glycol-Propyl Glycol (SYSTANE OP) Place 1 drop into both eyes daily as needed (dry eyes).    . prochlorperazine (COMPAZINE) 10 MG tablet Take 1 tablet (10 mg total) by mouth every 6 (six) hours as needed (Nausea or vomiting). 30 tablet 1   No current facility-administered medications for this visit.    PHYSICAL EXAMINATION: ECOG PERFORMANCE STATUS: 1 - Symptomatic but completely  ambulatory  Vitals:   09/14/20 0831  BP: 120/70  Pulse: 61  Resp: 20  Temp: (!) 97.5 F (36.4 C)  SpO2: 100%   Filed Weights   09/14/20 0831  Weight: 151 lb (68.5 kg)    LABORATORY DATA:  I have reviewed the data as listed CMP Latest Ref Rng & Units 08/24/2020 08/03/2020 07/21/2020  Glucose 70 - 99 mg/dL 77 91 128(H)  BUN 6 - 20 mg/dL _0 Creatinine 0.44 - 1.00 mg/dL 0.71 0.73 0.80  Sodium 135 - 145 mmol/L 142 140 135  Potassium 3.5 - 5.1 mmol/L 3.4(L) 3.5 3.7  Chloride 98 - 111 mmol/L 107 107 101  CO2 22 - 32 mmol/L _1 Calcium 8.9 - 10.3 mg/dL 9.3 9.2 9.4  Total Protein 6.5 - 8.1 g/dL 7.1 7.1 7.0  Total Bilirubin 0.3 - 1.2 mg/dL 0.4 0.4 <0.2(L)  Alkaline Phos 38 - 126 U/L 83 80 151(H)  AST 15 - 41 U/L 25 26 38  ALT 0 - 44 U/L 28 35 29    Lab Results  Component Value Date   WBC 5.6 09/14/2020   HGB 10.7 (L) 09/14/2020   HCT 32.5 (L) 09/14/2020   MCV 96.4 09/14/2020   PLT 202 09/14/2020   NEUTROABS 2.7 09/14/2020    ASSESSMENT & PLAN:  Malignant neoplasm of overlapping sites of right breast in female, estrogen receptor positive ([place]) 06/21/2020:Patient palpated a right breast mass x 1 wk. Mammogram showed multiple confluent masses in the right breast at the 7 o'clock position measuring 2.3cm and at the 9 o'clock position measuring 2.2cm with one right axillary lymph node with cortical thickening. Biopsy showed invasive mammary carcinoma at both positions and in the axilla, grade 2-3, HER-2 equivocal by IHC (2+), positive by FISH (ratio 3.34), ER+ >95%, PR+ 25%, Ki67 60%.  Treatment plan: 1. Neoadjuvant chemotherapy with TCH Perjeta 6 cycles followed by HerceptinPerjeta versus Kadcylamaintenance for 1 year 2. Followed by breast conserving surgery if possible with sentinel lymph node study 3. Followed by adjuvant radiation therapy if patient had lumpectomy 4.Followed by adjuvant antiestrogen therapy 5.Followed by  neratinib ---------------------------------------------------------------------------------------------------------------------------------- Current treatment: Cycle4TCH Perjeta Echocardiogram 07/06/2020: EF 60 to 65%  Chemo toxicities: 1. Occasional loose stools 2. 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 3.  Chemo-induced alopecia 4.  Chemo induced anemia: Hemoglobin today is 10.7 and we are monitoring it closely.  MRI guided biopsy of the left breast: Benign, papillomatous lesion cannot be ruled out.I discussed with her that surgery will have to determine if it needs to be removed Return to clinic in3week for cycle 5    No orders of the defined types were placed in this encounter.  The patient has a good understanding of the overall plan. she agrees with it. she will call with any problems that may develop before the  next visit here.  Total time spent: 30 mins including face to face time and time spent for planning, charting and coordination of care  Rulon Eisenmenger, MD, MPH 09/14/2020  I, Molly Dorshimer, am acting as scribe for Dr. Nicholas Lose.  I have reviewed the above documentation for accuracy and completeness, and I agree with the above.

## 2020-09-13 NOTE — Assessment & Plan Note (Signed)
06/21/2020:Patient palpated a right breast mass x 1 wk. Mammogram showed multiple confluent masses in the right breast at the 7 o'clock position measuring 2.3cm and at the 9 o'clock position measuring 2.2cm with one right axillary lymph node with cortical thickening. Biopsy showed invasive mammary carcinoma at both positions and in the axilla, grade 2-3, HER-2 equivocal by IHC (2+), positive by FISH (ratio 3.34), ER+ >95%, PR+ 25%, Ki67 60%.  Treatment plan: 1. Neoadjuvant chemotherapy with TCH Perjeta 6 cycles followed by HerceptinPerjeta versus Kadcylamaintenance for 1 year 2. Followed by breast conserving surgery if possible with sentinel lymph node study 3. Followed by adjuvant radiation therapy if patient had lumpectomy 4.Followed by adjuvant antiestrogen therapy 5.Followed by neratinib ---------------------------------------------------------------------------------------------------------------------------------- Current treatment: Cycle4TCH Perjeta Echocardiogram 07/06/2020: EF 60 to 65%  Chemo toxicities: 1.Pelvic pain due to Neulasta: Resolved 2.episode of constipation which required milk of magnesia Occasional loose stools  MRI guided biopsy of the left breast: Benign, papillomatous lesion cannot be ruled out.I discussed with her that surgery will have to determine if it needs to be removed Return to clinic in3week for cycle 5

## 2020-09-14 ENCOUNTER — Inpatient Hospital Stay: Payer: BC Managed Care – PPO | Admitting: Hematology and Oncology

## 2020-09-14 ENCOUNTER — Other Ambulatory Visit: Payer: Self-pay

## 2020-09-14 ENCOUNTER — Inpatient Hospital Stay: Payer: BC Managed Care – PPO

## 2020-09-14 DIAGNOSIS — C50811 Malignant neoplasm of overlapping sites of right female breast: Secondary | ICD-10-CM

## 2020-09-14 DIAGNOSIS — Z170 Estrogen receptor positive status [ER+]: Secondary | ICD-10-CM | POA: Diagnosis not present

## 2020-09-14 DIAGNOSIS — Z95828 Presence of other vascular implants and grafts: Secondary | ICD-10-CM

## 2020-09-14 DIAGNOSIS — Z5189 Encounter for other specified aftercare: Secondary | ICD-10-CM | POA: Diagnosis not present

## 2020-09-14 DIAGNOSIS — Z5111 Encounter for antineoplastic chemotherapy: Secondary | ICD-10-CM | POA: Diagnosis not present

## 2020-09-14 DIAGNOSIS — Z5112 Encounter for antineoplastic immunotherapy: Secondary | ICD-10-CM | POA: Diagnosis not present

## 2020-09-14 LAB — CBC WITH DIFFERENTIAL/PLATELET
Abs Immature Granulocytes: 0.03 10*3/uL (ref 0.00–0.07)
Basophils Absolute: 0.1 10*3/uL (ref 0.0–0.1)
Basophils Relative: 1 %
Eosinophils Absolute: 0 10*3/uL (ref 0.0–0.5)
Eosinophils Relative: 1 %
HCT: 32.5 % — ABNORMAL LOW (ref 36.0–46.0)
Hemoglobin: 10.7 g/dL — ABNORMAL LOW (ref 12.0–15.0)
Immature Granulocytes: 1 %
Lymphocytes Relative: 35 %
Lymphs Abs: 2 10*3/uL (ref 0.7–4.0)
MCH: 31.8 pg (ref 26.0–34.0)
MCHC: 32.9 g/dL (ref 30.0–36.0)
MCV: 96.4 fL (ref 80.0–100.0)
Monocytes Absolute: 0.8 10*3/uL (ref 0.1–1.0)
Monocytes Relative: 15 %
Neutro Abs: 2.7 10*3/uL (ref 1.7–7.7)
Neutrophils Relative %: 47 %
Platelets: 202 10*3/uL (ref 150–400)
RBC: 3.37 MIL/uL — ABNORMAL LOW (ref 3.87–5.11)
RDW: 14.6 % (ref 11.5–15.5)
WBC: 5.6 10*3/uL (ref 4.0–10.5)
nRBC: 0 % (ref 0.0–0.2)

## 2020-09-14 LAB — COMPREHENSIVE METABOLIC PANEL
ALT: 25 U/L (ref 0–44)
AST: 25 U/L (ref 15–41)
Albumin: 3.9 g/dL (ref 3.5–5.0)
Alkaline Phosphatase: 78 U/L (ref 38–126)
Anion gap: 10 (ref 5–15)
BUN: 16 mg/dL (ref 6–20)
CO2: 24 mmol/L (ref 22–32)
Calcium: 9.1 mg/dL (ref 8.9–10.3)
Chloride: 108 mmol/L (ref 98–111)
Creatinine, Ser: 0.75 mg/dL (ref 0.44–1.00)
GFR, Estimated: >60 mL/min (ref >60)
Glucose, Bld: 83 mg/dL (ref 70–99)
Potassium: 3.5 mmol/L (ref 3.5–5.1)
Sodium: 142 mmol/L (ref 135–145)
Total Bilirubin: 0.4 mg/dL (ref 0.3–1.2)
Total Protein: 6.8 g/dL (ref 6.5–8.1)

## 2020-09-14 MED ORDER — ACETAMINOPHEN 325 MG PO TABS
650.0000 mg | ORAL_TABLET | Freq: Once | ORAL | Status: AC
  Administered 2020-09-14: 650 mg via ORAL

## 2020-09-14 MED ORDER — SODIUM CHLORIDE 0.9 % IV SOLN
420.0000 mg | Freq: Once | INTRAVENOUS | Status: AC
  Administered 2020-09-14: 420 mg via INTRAVENOUS
  Filled 2020-09-14: qty 14

## 2020-09-14 MED ORDER — SODIUM CHLORIDE 0.9% FLUSH
10.0000 mL | INTRAVENOUS | Status: DC | PRN
  Administered 2020-09-14: 10 mL via INTRAVENOUS
  Filled 2020-09-14: qty 10

## 2020-09-14 MED ORDER — ACETAMINOPHEN 325 MG PO TABS
ORAL_TABLET | ORAL | Status: AC
  Filled 2020-09-14: qty 2

## 2020-09-14 MED ORDER — SODIUM CHLORIDE 0.9% FLUSH
10.0000 mL | INTRAVENOUS | Status: DC | PRN
  Administered 2020-09-14: 10 mL
  Filled 2020-09-14: qty 10

## 2020-09-14 MED ORDER — DIPHENHYDRAMINE HCL 25 MG PO CAPS
ORAL_CAPSULE | ORAL | Status: AC
  Filled 2020-09-14: qty 2

## 2020-09-14 MED ORDER — SODIUM CHLORIDE 0.9 % IV SOLN
Freq: Once | INTRAVENOUS | Status: AC
  Filled 2020-09-14: qty 250

## 2020-09-14 MED ORDER — HEPARIN SOD (PORK) LOCK FLUSH 100 UNIT/ML IV SOLN
500.0000 [IU] | Freq: Once | INTRAVENOUS | Status: AC | PRN
  Administered 2020-09-14: 500 [IU]
  Filled 2020-09-14: qty 5

## 2020-09-14 MED ORDER — SODIUM CHLORIDE 0.9 % IV SOLN
75.0000 mg/m2 | Freq: Once | INTRAVENOUS | Status: AC
  Administered 2020-09-14: 140 mg via INTRAVENOUS
  Filled 2020-09-14: qty 14

## 2020-09-14 MED ORDER — SODIUM CHLORIDE 0.9 % IV SOLN
634.2000 mg | Freq: Once | INTRAVENOUS | Status: AC
  Administered 2020-09-14: 630 mg via INTRAVENOUS
  Filled 2020-09-14: qty 63

## 2020-09-14 MED ORDER — PALONOSETRON HCL INJECTION 0.25 MG/5ML
INTRAVENOUS | Status: AC
  Filled 2020-09-14: qty 5

## 2020-09-14 MED ORDER — SODIUM CHLORIDE 0.9 % IV SOLN
10.0000 mg | Freq: Once | INTRAVENOUS | Status: AC
  Administered 2020-09-14: 10 mg via INTRAVENOUS
  Filled 2020-09-14: qty 10

## 2020-09-14 MED ORDER — SODIUM CHLORIDE 0.9 % IV SOLN
150.0000 mg | Freq: Once | INTRAVENOUS | Status: AC
  Administered 2020-09-14: 150 mg via INTRAVENOUS
  Filled 2020-09-14: qty 150

## 2020-09-14 MED ORDER — DIPHENHYDRAMINE HCL 25 MG PO CAPS
50.0000 mg | ORAL_CAPSULE | Freq: Once | ORAL | Status: AC
  Administered 2020-09-14: 50 mg via ORAL

## 2020-09-14 MED ORDER — TRASTUZUMAB-DKST CHEMO 150 MG IV SOLR
6.0000 mg/kg | Freq: Once | INTRAVENOUS | Status: AC
  Administered 2020-09-14: 399 mg via INTRAVENOUS
  Filled 2020-09-14: qty 19

## 2020-09-14 MED ORDER — PALONOSETRON HCL INJECTION 0.25 MG/5ML
0.2500 mg | Freq: Once | INTRAVENOUS | Status: AC
  Administered 2020-09-14: 0.25 mg via INTRAVENOUS

## 2020-09-14 NOTE — Patient Instructions (Signed)

## 2020-09-14 NOTE — Patient Instructions (Signed)
[facility] Discharge Instructions for Patients Receiving Chemotherapy  Today you received the following chemotherapy agents Trastuzumab-dkst (OGIVRI), Pertuzumab (PERJETA), Docetaxel (TAXOTERE) & Carboplatin (PARAPLATIN).  To help prevent nausea and vomiting after your treatment, we encourage you to take your nausea medication as prescribed.   If you develop nausea and vomiting that is not controlled by your nausea medication, call the clinic.   BELOW ARE SYMPTOMS THAT SHOULD BE REPORTED IMMEDIATELY:  *FEVER GREATER THAN 100.5 F  *CHILLS WITH OR WITHOUT FEVER  NAUSEA AND VOMITING THAT IS NOT CONTROLLED WITH YOUR NAUSEA MEDICATION  *UNUSUAL SHORTNESS OF BREATH  *UNUSUAL BRUISING OR BLEEDING  TENDERNESS IN MOUTH AND THROAT WITH OR WITHOUT PRESENCE OF ULCERS  *URINARY PROBLEMS  *BOWEL PROBLEMS  UNUSUAL RASH Items with * indicate a potential emergency and should be followed up as soon as possible.  Feel free to call the clinic should you have any questions or concerns. The clinic phone number is [phone].  Please show the [place] at check-in to the Emergency Department and triage nurse.

## 2020-09-16 ENCOUNTER — Other Ambulatory Visit: Payer: Self-pay

## 2020-09-16 ENCOUNTER — Inpatient Hospital Stay: Payer: BC Managed Care – PPO

## 2020-09-16 VITALS — BP 115/61 | HR 60 | Resp 18

## 2020-09-16 DIAGNOSIS — Z5111 Encounter for antineoplastic chemotherapy: Secondary | ICD-10-CM | POA: Diagnosis not present

## 2020-09-16 DIAGNOSIS — C50811 Malignant neoplasm of overlapping sites of right female breast: Secondary | ICD-10-CM

## 2020-09-16 DIAGNOSIS — Z170 Estrogen receptor positive status [ER+]: Secondary | ICD-10-CM | POA: Diagnosis not present

## 2020-09-16 DIAGNOSIS — Z5189 Encounter for other specified aftercare: Secondary | ICD-10-CM | POA: Diagnosis not present

## 2020-09-16 DIAGNOSIS — Z5112 Encounter for antineoplastic immunotherapy: Secondary | ICD-10-CM | POA: Diagnosis not present

## 2020-09-16 MED ORDER — PEGFILGRASTIM-CBQV 6 MG/0.6ML ~~LOC~~ SOSY
6.0000 mg | PREFILLED_SYRINGE | Freq: Once | SUBCUTANEOUS | Status: AC
  Administered 2020-09-16: 6 mg via SUBCUTANEOUS

## 2020-09-16 MED ORDER — PEGFILGRASTIM-CBQV 6 MG/0.6ML ~~LOC~~ SOSY
PREFILLED_SYRINGE | SUBCUTANEOUS | Status: AC
  Filled 2020-09-16: qty 0.6

## 2020-09-16 NOTE — Patient Instructions (Signed)
Pegfilgrastim injection What is this medicine? PEGFILGRASTIM (PEG fil gra stim) is a long-acting granulocyte colony-stimulating factor that stimulates the growth of neutrophils, a type of white blood cell important in the body's fight against infection. It is used to reduce the incidence of fever and infection in patients with certain types of cancer who are receiving chemotherapy that affects the bone marrow, and to increase survival after being exposed to high doses of radiation. This medicine may be used for other purposes; ask your health care provider or pharmacist if you have questions. COMMON BRAND NAME(S): Fulphila, Neulasta, Nyvepria, UDENYCA, Ziextenzo What should I tell my health care provider before I take this medicine? They need to know if you have any of these conditions:  kidney disease  latex allergy  ongoing radiation therapy  sickle cell disease  skin reactions to acrylic adhesives (On-Body Injector only)  an unusual or allergic reaction to pegfilgrastim, filgrastim, other medicines, foods, dyes, or preservatives  pregnant or trying to get pregnant  breast-feeding How should I use this medicine? This medicine is for injection under the skin. If you get this medicine at home, you will be taught how to prepare and give the pre-filled syringe or how to use the On-body Injector. Refer to the patient Instructions for Use for detailed instructions. Use exactly as directed. Tell your healthcare provider immediately if you suspect that the On-body Injector may not have performed as intended or if you suspect the use of the On-body Injector resulted in a missed or partial dose. It is important that you put your used needles and syringes in a special sharps container. Do not put them in a trash can. If you do not have a sharps container, call your pharmacist or healthcare provider to get one. Talk to your pediatrician regarding the use of this medicine in children. While this drug  may be prescribed for selected conditions, precautions do apply. Overdosage: If you think you have taken too much of this medicine contact a poison control center or emergency room at once. NOTE: This medicine is only for you. Do not share this medicine with others. What if I miss a dose? It is important not to miss your dose. Call your doctor or health care professional if you miss your dose. If you miss a dose due to an On-body Injector failure or leakage, a new dose should be administered as soon as possible using a single prefilled syringe for manual use. What may interact with this medicine? Interactions have not been studied. This list may not describe all possible interactions. Give your health care provider a list of all the medicines, herbs, non-prescription drugs, or dietary supplements you use. Also tell them if you smoke, drink alcohol, or use illegal drugs. Some items may interact with your medicine. What should I watch for while using this medicine? Your condition will be monitored carefully while you are receiving this medicine. You may need blood work done while you are taking this medicine. Talk to your health care provider about your risk of cancer. You may be more at risk for certain types of cancer if you take this medicine. If you are going to need a MRI, CT scan, or other procedure, tell your doctor that you are using this medicine (On-Body Injector only). What side effects may I notice from receiving this medicine? Side effects that you should report to your doctor or health care professional as soon as possible:  allergic reactions (skin rash, itching or hives, swelling of   the face, lips, or tongue)  back pain  dizziness  fever  pain, redness, or irritation at site where injected  pinpoint red spots on the skin  red or dark-brown urine  shortness of breath or breathing problems  stomach or side pain, or pain at the shoulder  swelling  tiredness  trouble  passing urine or change in the amount of urine  unusual bruising or bleeding Side effects that usually do not require medical attention (report to your doctor or health care professional if they continue or are bothersome):  bone pain  muscle pain This list may not describe all possible side effects. Call your doctor for medical advice about side effects. You may report side effects to FDA at 1-800-FDA-1088. Where should I keep my medicine? Keep out of the reach of children. If you are using this medicine at home, you will be instructed on how to store it. Throw away any unused medicine after the expiration date on the label. NOTE: This sheet is a summary. It may not cover all possible information. If you have questions about this medicine, talk to your doctor, pharmacist, or health care provider.  2021 Elsevier/Gold Standard (2019-07-03 13:20:51)  

## 2020-09-21 ENCOUNTER — Telehealth: Payer: Self-pay | Admitting: *Deleted

## 2020-09-21 ENCOUNTER — Encounter: Payer: Self-pay | Admitting: Radiation Oncology

## 2020-09-21 NOTE — Telephone Encounter (Signed)
Received call from pt requesting exact timeframe to receive second covid 19 booster.  Per MD okay for pt to receive booster 1 week prior to tx date.  Pt verbalized understanding and appreciative of advice.

## 2020-10-03 NOTE — Progress Notes (Signed)
Patient Care Team: Wenda Low, MD as PCP - General (Internal Medicine) Rockwell Germany, RN as Oncology Nurse Navigator Mauro Kaufmann, RN as Oncology Nurse Navigator [name], MD as Consulting Physician (General Surgery) Nicholas Lose, MD as Consulting Physician (Hematology and Oncology) Kyung Rudd, MD as Consulting Physician (Radiation Oncology)  DIAGNOSIS:    ICD-10-CM   1. Malignant neoplasm of overlapping sites of right breast in female, estrogen receptor positive ([place])  C50.811    Z17.0     SUMMARY OF ONCOLOGIC HISTORY: Oncology History  Malignant neoplasm of overlapping sites of right breast in female, estrogen receptor positive ([place])  06/21/2020 Initial Diagnosis   Patient palpated a right breast mass x 1 wk. Mammogram showed multiple confluent masses in the right breast at the 7 o'clock position measuring 2.3cm and at the 9 o'clock position measuring 2.2cm with one right axillary lymph node with cortical thickening. Biopsy showed invasive mammary carcinoma at both positions and in the axilla, grade 2-3, HER-2 equivocal by IHC (2+), positive by FISH (ratio 3.34), ER+ >95%, PR+ 25%, Ki67 60%.   06/29/2020 Cancer Staging   Staging form: Breast, AJCC 8th Edition - Clinical stage from 06/29/2020: Stage IB (cT2, cN1(f), cM0, G3, ER+, PR+, HER2+) - Signed by Nicholas Lose, MD on 06/29/2020   07/13/2020 -  Chemotherapy    Patient is on Treatment Plan: BREAST  DOCETAXEL + CARBOPLATIN + TRASTUZUMAB + PERTUZUMAB  (TCHP) Q21D         CHIEF COMPLIANT: Cycle5Day1TCH Perjeta  INTERVAL HISTORY: Teresa Sheppard is a 60 y.o. with above-mentioned history of invasive mammary carcinoma of the right breast currently on neoadjuvant chemotherapy with [place]. She presents to the clinic todayfora toxicity checkandcycle5.   ALLERGIES:  is allergic to penicillins.  MEDICATIONS:  Current Outpatient Medications  Medication Sig Dispense Refill  . Acetaminophen (TYLENOL)  325 MG CAPS Take 162.5-325 mg by mouth every 6 (six) hours as needed (headaches / pain).    . cetirizine (ZYRTEC) 10 MG tablet Take 10 mg by mouth daily as needed for allergies.    . cholecalciferol (VITAMIN D3) 25 MCG (1000 UNIT) tablet Take 1,000 Units by mouth daily.    Marland Kitchen dexamethasone (DECADRON) 4 MG tablet Take 1 tablet (4 mg total) by mouth 2 (two) times daily. Take 1 tablet day before chemo and 1 tablet a day after chemo with food (Patient taking differently: Take 4 mg by mouth See admin instructions. Take 1 tablet day before chemo and 1 tablet a day after chemo with food) 12 tablet 0  . fluticasone (FLONASE) 50 MCG/ACT nasal spray Place 1 spray into both nostrils daily as needed for allergies or rhinitis.    Marland Kitchen lidocaine-prilocaine (EMLA) cream Apply to affected area once (Patient taking differently: Apply 1 application topically daily as needed (port access).) 30 g 3  . LORazepam (ATIVAN) 0.5 MG tablet Take 1 tablet (0.5 mg total) by mouth at bedtime. As needed 30 tablet 1  . Omega-3 Fatty Acids (FISH OIL) 1000 MG CAPS Take 1,000 mg by mouth 3 (three) times a week.    . ondansetron (ZOFRAN) 8 MG tablet Take 1 tablet (8 mg total) by mouth 2 (two) times daily as needed (Nausea or vomiting). Start on the third day after chemotherapy. 30 tablet 1  . Polyethyl Glycol-Propyl Glycol (SYSTANE OP) Place 1 drop into both eyes daily as needed (dry eyes).    . prochlorperazine (COMPAZINE) 10 MG tablet Take 1 tablet (10 mg total) by mouth every  6 (six) hours as needed (Nausea or vomiting). 30 tablet 1   No current facility-administered medications for this visit.    PHYSICAL EXAMINATION: ECOG PERFORMANCE STATUS: 1 - Symptomatic but completely ambulatory  Vitals:   10/04/20 0926  BP: 119/61  Pulse: (!) 59  Resp: 17  Temp: (!) 97.3 F (36.3 C)  SpO2: 100%   Filed Weights   10/04/20 0926  Weight: 152 lb 8 oz (69.2 kg)     LABORATORY DATA:  I have reviewed the data as listed CMP Latest Ref  Rng & Units 09/14/2020 08/24/2020 08/03/2020  Glucose 70 - 99 mg/dL 83 77 91  BUN 6 - 20 mg/dL _0 Creatinine 0.44 - 1.00 mg/dL 0.75 0.71 0.73  Sodium 135 - 145 mmol/L 142 142 140  Potassium 3.5 - 5.1 mmol/L 3.5 3.4(L) 3.5  Chloride 98 - 111 mmol/L 108 107 107  CO2 22 - 32 mmol/L _1 Calcium 8.9 - 10.3 mg/dL 9.1 9.3 9.2  Total Protein 6.5 - 8.1 g/dL 6.8 7.1 7.1  Total Bilirubin 0.3 - 1.2 mg/dL 0.4 0.4 0.4  Alkaline Phos 38 - 126 U/L 78 83 80  AST 15 - 41 U/L _2 ALT 0 - 44 U/L 25 28 35    Lab Results  Component Value Date   WBC 6.7 10/04/2020   HGB 10.3 (L) 10/04/2020   HCT 31.5 (L) 10/04/2020   MCV 98.1 10/04/2020   PLT 183 10/04/2020   NEUTROABS 3.6 10/04/2020    ASSESSMENT & PLAN:  Malignant neoplasm of overlapping sites of right breast in female, estrogen receptor positive ([place]) 06/21/2020:Patient palpated a right breast mass x 1 wk. Mammogram showed multiple confluent masses in the right breast at the 7 o'clock position measuring 2.3cm and at the 9 o'clock position measuring 2.2cm with one right axillary lymph node with cortical thickening. Biopsy showed invasive mammary carcinoma at both positions and in the axilla, grade 2-3, HER-2 equivocal by IHC (2+), positive by FISH (ratio 3.34), ER+ >95%, PR+ 25%, Ki67 60%.  Treatment plan: 1. Neoadjuvant chemotherapy with TCH Perjeta 6 cycles followed by HerceptinPerjeta versus Kadcylamaintenance for 1 year 2. Followed by breast conserving surgery if possible with sentinel lymph node study 3. Followed by adjuvant radiation therapy if patient had lumpectomy 4.Followed by adjuvant antiestrogen therapy 5.Followed by neratinib ---------------------------------------------------------------------------------------------------------------------------------- Current treatment: Cycle5TCH Perjeta Echocardiogram 07/06/2020: EF 60 to 65%  Chemo toxicities: 1. Occasional loose stools 2. early findings of her  peripheral neuropathy: We will keep the dose the same for this treatment and we may have to consider reducing the dosage if her symptoms gets worse with the next cycle. 3.  Chemo-induced alopecia 4.  Chemo induced anemia: Hemoglobin today is 10.7 and we are monitoring it closely.  MRI guided biopsy of the left breast: Benign, papillomatous lesion cannot be ruled out.I discussed with her that surgery will have to determine if it needs to be removed Return to clinic in3week for cycle6 I will order breast MRI after the last cycle of chemo. We will also request surgery appointment after that.  She wants to meet with plastic surgery to discuss surgical options.   No orders of the defined types were placed in this encounter.  The patient has a good understanding of the overall plan. she agrees with it. she will call with any problems that may develop before the next visit here.  Total time spent: 30 mins including face to face time and time spent  for planning, charting and coordination of care  Rulon Eisenmenger, MD, MPH 10/04/2020  I, Molly Dorshimer, am acting as scribe for Dr. Nicholas Lose.  I have reviewed the above documentation for accuracy and completeness, and I agree with the above.

## 2020-10-04 ENCOUNTER — Encounter: Payer: Self-pay | Admitting: *Deleted

## 2020-10-04 ENCOUNTER — Other Ambulatory Visit: Payer: Self-pay

## 2020-10-04 ENCOUNTER — Other Ambulatory Visit: Payer: Self-pay | Admitting: Hematology and Oncology

## 2020-10-04 ENCOUNTER — Inpatient Hospital Stay: Payer: BC Managed Care – PPO

## 2020-10-04 ENCOUNTER — Inpatient Hospital Stay: Payer: BC Managed Care – PPO | Attending: Hematology and Oncology

## 2020-10-04 ENCOUNTER — Inpatient Hospital Stay: Payer: BC Managed Care – PPO | Admitting: Hematology and Oncology

## 2020-10-04 ENCOUNTER — Other Ambulatory Visit: Payer: Self-pay | Admitting: *Deleted

## 2020-10-04 DIAGNOSIS — C50811 Malignant neoplasm of overlapping sites of right female breast: Secondary | ICD-10-CM

## 2020-10-04 DIAGNOSIS — R5383 Other fatigue: Secondary | ICD-10-CM | POA: Diagnosis not present

## 2020-10-04 DIAGNOSIS — G620 Drug-induced polyneuropathy: Secondary | ICD-10-CM | POA: Diagnosis not present

## 2020-10-04 DIAGNOSIS — Z5189 Encounter for other specified aftercare: Secondary | ICD-10-CM | POA: Insufficient documentation

## 2020-10-04 DIAGNOSIS — Z5111 Encounter for antineoplastic chemotherapy: Secondary | ICD-10-CM | POA: Insufficient documentation

## 2020-10-04 DIAGNOSIS — Z170 Estrogen receptor positive status [ER+]: Secondary | ICD-10-CM

## 2020-10-04 DIAGNOSIS — D6481 Anemia due to antineoplastic chemotherapy: Secondary | ICD-10-CM | POA: Diagnosis not present

## 2020-10-04 DIAGNOSIS — Z1231 Encounter for screening mammogram for malignant neoplasm of breast: Secondary | ICD-10-CM

## 2020-10-04 DIAGNOSIS — Z5112 Encounter for antineoplastic immunotherapy: Secondary | ICD-10-CM | POA: Insufficient documentation

## 2020-10-04 DIAGNOSIS — L658 Other specified nonscarring hair loss: Secondary | ICD-10-CM | POA: Insufficient documentation

## 2020-10-04 LAB — CBC WITH DIFFERENTIAL/PLATELET
Abs Immature Granulocytes: 0.02 10*3/uL (ref 0.00–0.07)
Basophils Absolute: 0.1 10*3/uL (ref 0.0–0.1)
Basophils Relative: 1 %
Eosinophils Absolute: 0 10*3/uL (ref 0.0–0.5)
Eosinophils Relative: 0 %
HCT: 31.5 % — ABNORMAL LOW (ref 36.0–46.0)
Hemoglobin: 10.3 g/dL — ABNORMAL LOW (ref 12.0–15.0)
Immature Granulocytes: 0 %
Lymphocytes Relative: 30 %
Lymphs Abs: 2 10*3/uL (ref 0.7–4.0)
MCH: 32.1 pg (ref 26.0–34.0)
MCHC: 32.7 g/dL (ref 30.0–36.0)
MCV: 98.1 fL (ref 80.0–100.0)
Monocytes Absolute: 1 10*3/uL (ref 0.1–1.0)
Monocytes Relative: 14 %
Neutro Abs: 3.6 10*3/uL (ref 1.7–7.7)
Neutrophils Relative %: 55 %
Platelets: 183 10*3/uL (ref 150–400)
RBC: 3.21 MIL/uL — ABNORMAL LOW (ref 3.87–5.11)
RDW: 14.3 % (ref 11.5–15.5)
WBC: 6.7 10*3/uL (ref 4.0–10.5)
nRBC: 0 % (ref 0.0–0.2)

## 2020-10-04 LAB — COMPREHENSIVE METABOLIC PANEL
ALT: 22 U/L (ref 0–44)
AST: 26 U/L (ref 15–41)
Albumin: 3.8 g/dL (ref 3.5–5.0)
Alkaline Phosphatase: 80 U/L (ref 38–126)
Anion gap: 12 (ref 5–15)
BUN: 15 mg/dL (ref 6–20)
CO2: 23 mmol/L (ref 22–32)
Calcium: 8.9 mg/dL (ref 8.9–10.3)
Chloride: 108 mmol/L (ref 98–111)
Creatinine, Ser: 0.67 mg/dL (ref 0.44–1.00)
GFR, Estimated: >60 mL/min (ref >60)
Glucose, Bld: 96 mg/dL (ref 70–99)
Potassium: 3.6 mmol/L (ref 3.5–5.1)
Sodium: 143 mmol/L (ref 135–145)
Total Bilirubin: 0.3 mg/dL (ref 0.3–1.2)
Total Protein: 6.6 g/dL (ref 6.5–8.1)

## 2020-10-04 LAB — URINALYSIS, COMPLETE (UACMP) WITH MICROSCOPIC
Bilirubin Urine: NEGATIVE
Glucose, UA: NEGATIVE mg/dL
Hgb urine dipstick: NEGATIVE
Ketones, ur: NEGATIVE mg/dL
Nitrite: NEGATIVE
Protein, ur: NEGATIVE mg/dL
Specific Gravity, Urine: 1.004 — ABNORMAL LOW (ref 1.005–1.030)
pH: 6 (ref 5.0–8.0)

## 2020-10-04 MED ORDER — DIPHENHYDRAMINE HCL 25 MG PO CAPS
50.0000 mg | ORAL_CAPSULE | Freq: Once | ORAL | Status: AC
  Administered 2020-10-04: 50 mg via ORAL

## 2020-10-04 MED ORDER — TRASTUZUMAB-DKST CHEMO 150 MG IV SOLR
6.0000 mg/kg | Freq: Once | INTRAVENOUS | Status: AC
  Administered 2020-10-04: 399 mg via INTRAVENOUS
  Filled 2020-10-04: qty 19

## 2020-10-04 MED ORDER — SODIUM CHLORIDE 0.9 % IV SOLN
10.0000 mg | Freq: Once | INTRAVENOUS | Status: AC
  Administered 2020-10-04: 10 mg via INTRAVENOUS
  Filled 2020-10-04: qty 10

## 2020-10-04 MED ORDER — DIPHENHYDRAMINE HCL 25 MG PO CAPS
ORAL_CAPSULE | ORAL | Status: AC
  Filled 2020-10-04: qty 2

## 2020-10-04 MED ORDER — ACETAMINOPHEN 325 MG PO TABS
ORAL_TABLET | ORAL | Status: AC
  Filled 2020-10-04: qty 2

## 2020-10-04 MED ORDER — SODIUM CHLORIDE 0.9 % IV SOLN
420.0000 mg | Freq: Once | INTRAVENOUS | Status: AC
  Administered 2020-10-04: 420 mg via INTRAVENOUS
  Filled 2020-10-04: qty 14

## 2020-10-04 MED ORDER — SODIUM CHLORIDE 0.9% FLUSH
10.0000 mL | INTRAVENOUS | Status: DC | PRN
  Administered 2020-10-04: 10 mL
  Filled 2020-10-04: qty 10

## 2020-10-04 MED ORDER — ACETAMINOPHEN 325 MG PO TABS
650.0000 mg | ORAL_TABLET | Freq: Once | ORAL | Status: AC
  Administered 2020-10-04: 650 mg via ORAL

## 2020-10-04 MED ORDER — PALONOSETRON HCL INJECTION 0.25 MG/5ML
0.2500 mg | Freq: Once | INTRAVENOUS | Status: AC
  Administered 2020-10-04: 0.25 mg via INTRAVENOUS

## 2020-10-04 MED ORDER — SODIUM CHLORIDE 0.9 % IV SOLN
634.2000 mg | Freq: Once | INTRAVENOUS | Status: AC
  Administered 2020-10-04: 630 mg via INTRAVENOUS
  Filled 2020-10-04: qty 63

## 2020-10-04 MED ORDER — SODIUM CHLORIDE 0.9 % IV SOLN
Freq: Once | INTRAVENOUS | Status: AC
  Filled 2020-10-04: qty 250

## 2020-10-04 MED ORDER — SODIUM CHLORIDE 0.9 % IV SOLN
75.0000 mg/m2 | Freq: Once | INTRAVENOUS | Status: AC
  Administered 2020-10-04: 140 mg via INTRAVENOUS
  Filled 2020-10-04: qty 14

## 2020-10-04 MED ORDER — HEPARIN SOD (PORK) LOCK FLUSH 100 UNIT/ML IV SOLN
500.0000 [IU] | Freq: Once | INTRAVENOUS | Status: AC | PRN
  Administered 2020-10-04: 500 [IU]
  Filled 2020-10-04: qty 5

## 2020-10-04 MED ORDER — PALONOSETRON HCL INJECTION 0.25 MG/5ML
INTRAVENOUS | Status: AC
  Filled 2020-10-04: qty 5

## 2020-10-04 MED ORDER — SODIUM CHLORIDE 0.9 % IV SOLN
150.0000 mg | Freq: Once | INTRAVENOUS | Status: AC
  Administered 2020-10-04: 150 mg via INTRAVENOUS
  Filled 2020-10-04: qty 150

## 2020-10-04 NOTE — Assessment & Plan Note (Signed)
06/21/2020:Patient palpated a right breast mass x 1 wk. Mammogram showed multiple confluent masses in the right breast at the 7 o'clock position measuring 2.3cm and at the 9 o'clock position measuring 2.2cm with one right axillary lymph node with cortical thickening. Biopsy showed invasive mammary carcinoma at both positions and in the axilla, grade 2-3, HER-2 equivocal by IHC (2+), positive by FISH (ratio 3.34), ER+ >95%, PR+ 25%, Ki67 60%.  Treatment plan: 1. Neoadjuvant chemotherapy with TCH Perjeta 6 cycles followed by HerceptinPerjeta versus Kadcylamaintenance for 1 year 2. Followed by breast conserving surgery if possible with sentinel lymph node study 3. Followed by adjuvant radiation therapy if patient had lumpectomy 4.Followed by adjuvant antiestrogen therapy 5.Followed by neratinib ---------------------------------------------------------------------------------------------------------------------------------- Current treatment: Cycle5TCH Perjeta Echocardiogram 07/06/2020: EF 60 to 65%  Chemo toxicities: 1. Occasional loose stools 2. 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 3.  Chemo-induced alopecia 4.  Chemo induced anemia: Hemoglobin today is 10.7 and we are monitoring it closely.  MRI guided biopsy of the left breast: Benign, papillomatous lesion cannot be ruled out.I discussed with her that surgery will have to determine if it needs to be removed Return to clinic in3week for cycle6

## 2020-10-04 NOTE — Patient Instructions (Signed)
[facility] Discharge Instructions for Patients Receiving Chemotherapy  Today you received the following chemotherapy agents: Trastuzumab, pertuzumab, taxotere, carboplatin  To help prevent nausea and vomiting after your treatment, we encourage you to take your nausea medication as directed.   If you develop nausea and vomiting that is not controlled by your nausea medication, call the clinic.   BELOW ARE SYMPTOMS THAT SHOULD BE REPORTED IMMEDIATELY:  *FEVER GREATER THAN 100.5 F  *CHILLS WITH OR WITHOUT FEVER  NAUSEA AND VOMITING THAT IS NOT CONTROLLED WITH YOUR NAUSEA MEDICATION  *UNUSUAL SHORTNESS OF BREATH  *UNUSUAL BRUISING OR BLEEDING  TENDERNESS IN MOUTH AND THROAT WITH OR WITHOUT PRESENCE OF ULCERS  *URINARY PROBLEMS  *BOWEL PROBLEMS  UNUSUAL RASH Items with * indicate a potential emergency and should be followed up as soon as possible.  Feel free to call the clinic should you have any questions or concerns. The clinic phone number is [phone].  Please show the [facility] at check-in to the Emergency Department and triage nurse.

## 2020-10-04 NOTE — Patient Instructions (Signed)

## 2020-10-06 ENCOUNTER — Inpatient Hospital Stay: Payer: BC Managed Care – PPO

## 2020-10-06 ENCOUNTER — Other Ambulatory Visit: Payer: Self-pay

## 2020-10-06 VITALS — BP 112/60 | HR 61

## 2020-10-06 DIAGNOSIS — Z5111 Encounter for antineoplastic chemotherapy: Secondary | ICD-10-CM | POA: Diagnosis not present

## 2020-10-06 DIAGNOSIS — L658 Other specified nonscarring hair loss: Secondary | ICD-10-CM | POA: Diagnosis not present

## 2020-10-06 DIAGNOSIS — G620 Drug-induced polyneuropathy: Secondary | ICD-10-CM | POA: Diagnosis not present

## 2020-10-06 DIAGNOSIS — R5383 Other fatigue: Secondary | ICD-10-CM | POA: Diagnosis not present

## 2020-10-06 DIAGNOSIS — C50811 Malignant neoplasm of overlapping sites of right female breast: Secondary | ICD-10-CM | POA: Diagnosis not present

## 2020-10-06 DIAGNOSIS — Z5189 Encounter for other specified aftercare: Secondary | ICD-10-CM | POA: Diagnosis not present

## 2020-10-06 DIAGNOSIS — Z5112 Encounter for antineoplastic immunotherapy: Secondary | ICD-10-CM | POA: Diagnosis not present

## 2020-10-06 DIAGNOSIS — Z170 Estrogen receptor positive status [ER+]: Secondary | ICD-10-CM | POA: Diagnosis not present

## 2020-10-06 DIAGNOSIS — D6481 Anemia due to antineoplastic chemotherapy: Secondary | ICD-10-CM | POA: Diagnosis not present

## 2020-10-06 MED ORDER — PEGFILGRASTIM-CBQV 6 MG/0.6ML ~~LOC~~ SOSY
6.0000 mg | PREFILLED_SYRINGE | Freq: Once | SUBCUTANEOUS | Status: AC
  Administered 2020-10-06: 6 mg via SUBCUTANEOUS

## 2020-10-06 MED ORDER — PEGFILGRASTIM-CBQV 6 MG/0.6ML ~~LOC~~ SOSY
PREFILLED_SYRINGE | SUBCUTANEOUS | Status: AC
  Filled 2020-10-06: qty 0.6

## 2020-10-07 ENCOUNTER — Telehealth: Payer: Self-pay | Admitting: Hematology and Oncology

## 2020-10-07 NOTE — Telephone Encounter (Signed)
Scheduled per 4/12 los. Pt will receive an updated appt calendar per next visit appt notes

## 2020-10-10 ENCOUNTER — Other Ambulatory Visit: Payer: Self-pay | Admitting: Medical

## 2020-10-10 ENCOUNTER — Other Ambulatory Visit: Payer: Self-pay

## 2020-10-10 ENCOUNTER — Inpatient Hospital Stay: Payer: BC Managed Care – PPO

## 2020-10-10 ENCOUNTER — Telehealth: Payer: Self-pay | Admitting: *Deleted

## 2020-10-10 DIAGNOSIS — L658 Other specified nonscarring hair loss: Secondary | ICD-10-CM | POA: Diagnosis not present

## 2020-10-10 DIAGNOSIS — D6481 Anemia due to antineoplastic chemotherapy: Secondary | ICD-10-CM | POA: Diagnosis not present

## 2020-10-10 DIAGNOSIS — C50811 Malignant neoplasm of overlapping sites of right female breast: Secondary | ICD-10-CM | POA: Diagnosis not present

## 2020-10-10 DIAGNOSIS — Z5189 Encounter for other specified aftercare: Secondary | ICD-10-CM | POA: Diagnosis not present

## 2020-10-10 DIAGNOSIS — R309 Painful micturition, unspecified: Secondary | ICD-10-CM

## 2020-10-10 DIAGNOSIS — R5383 Other fatigue: Secondary | ICD-10-CM | POA: Diagnosis not present

## 2020-10-10 DIAGNOSIS — Z5112 Encounter for antineoplastic immunotherapy: Secondary | ICD-10-CM | POA: Diagnosis not present

## 2020-10-10 DIAGNOSIS — G620 Drug-induced polyneuropathy: Secondary | ICD-10-CM | POA: Diagnosis not present

## 2020-10-10 DIAGNOSIS — Z5111 Encounter for antineoplastic chemotherapy: Secondary | ICD-10-CM | POA: Diagnosis not present

## 2020-10-10 DIAGNOSIS — Z170 Estrogen receptor positive status [ER+]: Secondary | ICD-10-CM | POA: Diagnosis not present

## 2020-10-10 LAB — URINALYSIS, COMPLETE (UACMP) WITH MICROSCOPIC
Bilirubin Urine: NEGATIVE
Glucose, UA: NEGATIVE mg/dL
Ketones, ur: NEGATIVE mg/dL
Nitrite: NEGATIVE
Protein, ur: NEGATIVE mg/dL
Specific Gravity, Urine: 1.005 (ref 1.005–1.030)
pH: 6 (ref 5.0–8.0)

## 2020-10-10 MED ORDER — SULFAMETHOXAZOLE-TRIMETHOPRIM 800-160 MG PO TABS: 1.0000 | ORAL_TABLET | Freq: Two times a day (BID) | ORAL | 0 refills | Status: DC

## 2020-10-10 MED ORDER — PHENAZOPYRIDINE HCL 200 MG PO TABS: 200.0000 mg | ORAL_TABLET | Freq: Three times a day (TID) | ORAL | 0 refills | Status: DC | PRN

## 2020-10-10 NOTE — Telephone Encounter (Signed)
Received call from patient complaining of pain when urinating.  Confirmed appointment for today for UA/urine culture at 145pm. I informed her I would inform Sandi Mealy PA of results and call in prescription accordingly and call her with results.  Patient verbalized understanding.

## 2020-10-11 ENCOUNTER — Telehealth: Payer: Self-pay

## 2020-10-11 NOTE — Telephone Encounter (Signed)
RN returned call.  Voicemail left for call back.  

## 2020-10-13 ENCOUNTER — Other Ambulatory Visit: Payer: Self-pay | Admitting: Medical

## 2020-10-13 LAB — URINE CULTURE: Culture: >=100000 — AB

## 2020-10-13 MED ORDER — NITROFURANTOIN MONOHYD MACRO 100 MG PO CAPS: 100.0000 mg | ORAL_CAPSULE | Freq: Two times a day (BID) | ORAL | 0 refills | Status: DC

## 2020-10-14 ENCOUNTER — Telehealth: Payer: Self-pay | Admitting: *Deleted

## 2020-10-14 DIAGNOSIS — N390 Urinary tract infection, site not specified: Secondary | ICD-10-CM | POA: Diagnosis not present

## 2020-10-14 DIAGNOSIS — R6889 Other general symptoms and signs: Secondary | ICD-10-CM | POA: Diagnosis not present

## 2020-10-14 DIAGNOSIS — C50919 Malignant neoplasm of unspecified site of unspecified female breast: Secondary | ICD-10-CM | POA: Diagnosis not present

## 2020-10-14 NOTE — Telephone Encounter (Signed)
Received call from pt with complaint of body ache, all over muscle fatigue, and headache x1 day. Pt denies fever, SHOP, or chills.  Per Sandi Mealy, [facility] pt needing to be tested for Covid and Flu.  Pt educated to go to local pharmacy for testing and to increase p.o fluid intake and take tylenol as directed on the bottle to help alleviate headache.  Pt verbalized understanding and states she will call the office Monday if Covid results are positive.

## 2020-10-15 ENCOUNTER — Emergency Department (HOSPITAL_COMMUNITY)
Admission: EM | Admit: 2020-10-15 | Discharge: 2020-10-15 | Disposition: A | Discharge status: Home / Self Care | Payer: BC Managed Care – PPO | Attending: Emergency Medicine | Admitting: Emergency Medicine

## 2020-10-15 ENCOUNTER — Encounter (HOSPITAL_COMMUNITY): Payer: Self-pay

## 2020-10-15 ENCOUNTER — Emergency Department (HOSPITAL_COMMUNITY): Payer: BC Managed Care – PPO

## 2020-10-15 DIAGNOSIS — D72829 Elevated white blood cell count, unspecified: Secondary | ICD-10-CM | POA: Insufficient documentation

## 2020-10-15 DIAGNOSIS — R350 Frequency of micturition: Secondary | ICD-10-CM | POA: Diagnosis not present

## 2020-10-15 DIAGNOSIS — R3915 Urgency of urination: Secondary | ICD-10-CM | POA: Diagnosis not present

## 2020-10-15 DIAGNOSIS — R110 Nausea: Secondary | ICD-10-CM | POA: Diagnosis not present

## 2020-10-15 DIAGNOSIS — N3000 Acute cystitis without hematuria: Secondary | ICD-10-CM | POA: Diagnosis not present

## 2020-10-15 DIAGNOSIS — R21 Rash and other nonspecific skin eruption: Secondary | ICD-10-CM | POA: Diagnosis not present

## 2020-10-15 DIAGNOSIS — M791 Myalgia, unspecified site: Secondary | ICD-10-CM | POA: Diagnosis not present

## 2020-10-15 DIAGNOSIS — R509 Fever, unspecified: Secondary | ICD-10-CM | POA: Insufficient documentation

## 2020-10-15 DIAGNOSIS — C50911 Malignant neoplasm of unspecified site of right female breast: Secondary | ICD-10-CM | POA: Insufficient documentation

## 2020-10-15 DIAGNOSIS — R300 Dysuria: Secondary | ICD-10-CM | POA: Diagnosis not present

## 2020-10-15 DIAGNOSIS — A419 Sepsis, unspecified organism: Secondary | ICD-10-CM | POA: Diagnosis not present

## 2020-10-15 DIAGNOSIS — R9431 Abnormal electrocardiogram [ECG] [EKG]: Secondary | ICD-10-CM | POA: Diagnosis not present

## 2020-10-15 DIAGNOSIS — R1030 Lower abdominal pain, unspecified: Secondary | ICD-10-CM | POA: Insufficient documentation

## 2020-10-15 LAB — URINALYSIS, ROUTINE W REFLEX MICROSCOPIC
Bacteria, UA: NONE SEEN
Bilirubin Urine: NEGATIVE
Glucose, UA: NEGATIVE mg/dL
Hgb urine dipstick: NEGATIVE
Ketones, ur: NEGATIVE mg/dL
Nitrite: NEGATIVE
Protein, ur: NEGATIVE mg/dL
Specific Gravity, Urine: 1.011 (ref 1.005–1.030)
pH: 7 (ref 5.0–8.0)

## 2020-10-15 LAB — CBC WITH DIFFERENTIAL/PLATELET
Abs Immature Granulocytes: 0.97 10*3/uL — ABNORMAL HIGH (ref 0.00–0.07)
Basophils Absolute: 0.1 10*3/uL (ref 0.0–0.1)
Basophils Relative: 1 %
Eosinophils Absolute: 0 10*3/uL (ref 0.0–0.5)
Eosinophils Relative: 0 %
HCT: 34.2 % — ABNORMAL LOW (ref 36.0–46.0)
Hemoglobin: 11.4 g/dL — ABNORMAL LOW (ref 12.0–15.0)
Immature Granulocytes: 5 %
Lymphocytes Relative: 2 %
Lymphs Abs: 0.5 10*3/uL — ABNORMAL LOW (ref 0.7–4.0)
MCH: 33.1 pg (ref 26.0–34.0)
MCHC: 33.3 g/dL (ref 30.0–36.0)
MCV: 99.4 fL (ref 80.0–100.0)
Monocytes Absolute: 1.1 10*3/uL — ABNORMAL HIGH (ref 0.1–1.0)
Monocytes Relative: 6 %
Neutro Abs: 17.2 10*3/uL — ABNORMAL HIGH (ref 1.7–7.7)
Neutrophils Relative %: 86 %
Platelets: 155 10*3/uL (ref 150–400)
RBC: 3.44 MIL/uL — ABNORMAL LOW (ref 3.87–5.11)
RDW: 14.4 % (ref 11.5–15.5)
WBC: 20 10*3/uL — ABNORMAL HIGH (ref 4.0–10.5)
nRBC: 0 % (ref 0.0–0.2)

## 2020-10-15 LAB — COMPREHENSIVE METABOLIC PANEL
ALT: 46 U/L — ABNORMAL HIGH (ref 0–44)
AST: 51 U/L — ABNORMAL HIGH (ref 15–41)
Albumin: 3.9 g/dL (ref 3.5–5.0)
Alkaline Phosphatase: 140 U/L — ABNORMAL HIGH (ref 38–126)
Anion gap: 9 (ref 5–15)
BUN: 12 mg/dL (ref 6–20)
CO2: 24 mmol/L (ref 22–32)
Calcium: 8.8 mg/dL — ABNORMAL LOW (ref 8.9–10.3)
Chloride: 99 mmol/L (ref 98–111)
Creatinine, Ser: 0.73 mg/dL (ref 0.44–1.00)
GFR, Estimated: >60 mL/min (ref >60)
Glucose, Bld: 121 mg/dL — ABNORMAL HIGH (ref 70–99)
Potassium: 3.9 mmol/L (ref 3.5–5.1)
Sodium: 132 mmol/L — ABNORMAL LOW (ref 135–145)
Total Bilirubin: 0.2 mg/dL — ABNORMAL LOW (ref 0.3–1.2)
Total Protein: 7 g/dL (ref 6.5–8.1)

## 2020-10-15 LAB — LACTIC ACID, PLASMA: Lactic Acid, Venous: 1 mmol/L (ref 0.5–1.9)

## 2020-10-15 LAB — PROTIME-INR
INR: 1 (ref 0.8–1.2)
Prothrombin Time: 13.4 seconds (ref 11.4–15.2)

## 2020-10-15 LAB — HCG, QUANTITATIVE, PREGNANCY: hCG, Beta Chain, Quant, S: 4 m[IU]/mL (ref <5)

## 2020-10-15 LAB — APTT: aPTT: 28 seconds (ref 24–36)

## 2020-10-15 MED ORDER — LEVOFLOXACIN 250 MG PO TABS: 250.0000 mg | ORAL_TABLET | Freq: Every day | ORAL | 0 refills | Status: AC

## 2020-10-15 MED ORDER — LACTATED RINGERS IV BOLUS (SEPSIS)
1000.0000 mL | Freq: Once | INTRAVENOUS | Status: AC
  Administered 2020-10-15: 1000 mL via INTRAVENOUS

## 2020-10-15 MED ORDER — LEVOFLOXACIN IN D5W 750 MG/150ML IV SOLN
750.0000 mg | Freq: Once | INTRAVENOUS | Status: AC
  Administered 2020-10-15: 750 mg via INTRAVENOUS
  Filled 2020-10-15: qty 150

## 2020-10-15 NOTE — ED Notes (Signed)
Gave patient cup of ice water.

## 2020-10-15 NOTE — ED Provider Notes (Signed)
[facility] DEPT Provider Note   CSN: [id number] Arrival date & time: 10/15/20  1314     History Chief Complaint  Patient presents with  . Generalized Body Aches    Teresa Sheppard is a 60 y.o. female Who is currently undergoing chemotherapy for right breast cancer, stage Ib.  Patient has 1 chemotherapy session left.  Unfortunately has been experiencing urinary frequency, urgency, dysuria for the last 5 days.  Was recently transition from Bactrim to nitrofurantoin due to urinary culture results.  She presents today at the prompting of her oncologist now with concern for fever greater than 100.4 F axillary at home, chills, total body myalgias, nausea as well as persistent urinary symptoms.  Additional endorses DOE which is new for her. Concern for febrile neutropenia given patient is currently undergoing chemotherapy treatments.  Additionally patient recently experienced erythematous papular rash to the dorsum of both hands, diagnosed by oncology as contact dermatitis, resolving at this time.  I personally reviewed this patient's medical records.  HPI     Past Medical History:  Diagnosis Date  . Anxiety   . Breast cancer in female [facility])    Right  . PONV (postoperative nausea and vomiting)   . Thyromegaly     Patient Active Problem List   Diagnosis Date Noted  . Malignant neoplasm of overlapping sites of right breast in female, estrogen receptor positive ([place]) 06/21/2020    Past Surgical History:  Procedure Laterality Date  . ABDOMINAL WALL DEFECT REPAIR     following hernia repair in 2010  . APPENDECTOMY    . BOWEL RESECTION     2008  while pregnant with last child  . CESAREAN SECTION    . [place] OF UTERUS     2000 and 2014  . HERNIA REPAIR    . PORTACATH PLACEMENT Left 07/12/2020   Procedure: INSERTION PORT-A-CATH WITH ULTRASOUND GUIDANCE;  Surgeon: [name], MD;  Location: [facility];  Service: General;  Laterality:  Left;  . ROOT CANAL     2019  . URETERAL EXPLORATION     dilataion for congenitally small ureter     OB History   No obstetric history on file.     Family History  Problem Relation Age of Onset  . Breast cancer Mother 29  . Breast cancer Sister 48  . Breast cancer Maternal Aunt   . Pancreatic cancer Father     Social History   Tobacco Use  . Smoking status: Never Smoker  . Smokeless tobacco: Never Used  Vaping Use  . Vaping Use: Never used  Substance Use Topics  . Alcohol use: Yes    Comment: occas  . Drug use: Never    Home Medications Prior to Admission medications   Medication Sig Start Date End Date Taking? Authorizing Provider  Acetaminophen (TYLENOL) 325 MG CAPS Take 162.5-325 mg by mouth every 6 (six) hours as needed (headaches / pain).    [provider]  cetirizine (ZYRTEC) 10 MG tablet Take 10 mg by mouth daily as needed for allergies.    [provider]  cholecalciferol (VITAMIN D3) 25 MCG (1000 UNIT) tablet Take 1,000 Units by mouth daily.    [provider]  dexamethasone (DECADRON) 4 MG tablet Take 1 tablet (4 mg total) by mouth 2 (two) times daily. Take 1 tablet day before chemo and 1 tablet a day after chemo with food Patient taking differently: Take 4 mg by mouth See admin instructions. Take 1  tablet day before chemo and 1 tablet a day after chemo with food 06/29/20   Nicholas Lose, MD  fluticasone (FLONASE) 50 MCG/ACT nasal spray Place 1 spray into both nostrils daily as needed for allergies or rhinitis.    [provider]  lidocaine-prilocaine (EMLA) cream Apply to affected area once Patient taking differently: Apply 1 application topically daily as needed (port access). 06/29/20   Nicholas Lose, MD  LORazepam (ATIVAN) 0.5 MG tablet Take 1 tablet (0.5 mg total) by mouth at bedtime. As needed 07/07/20   Nicholas Lose, MD  nitrofurantoin, macrocrystal-monohydrate, (MACROBID) 100 MG capsule Take 1 capsule (100 mg total) by  mouth 2 (two) times daily. 10/13/20   Harle Stanford., PA-C  Omega-3 Fatty Acids (FISH OIL) 1000 MG CAPS Take 1,000 mg by mouth 3 (three) times a week.    [provider]  ondansetron (ZOFRAN) 8 MG tablet Take 1 tablet (8 mg total) by mouth 2 (two) times daily as needed (Nausea or vomiting). Start on the third day after chemotherapy. 06/29/20   Nicholas Lose, MD  phenazopyridine (PYRIDIUM) 200 MG tablet Take 1 tablet (200 mg total) by mouth 3 (three) times daily as needed for pain. 10/10/20   Tanner, Lyndon Code., PA-C  Polyethyl Glycol-Propyl Glycol (SYSTANE OP) Place 1 drop into both eyes daily as needed (dry eyes).    [provider]  prochlorperazine (COMPAZINE) 10 MG tablet Take 1 tablet (10 mg total) by mouth every 6 (six) hours as needed (Nausea or vomiting). 06/29/20   Nicholas Lose, MD  sulfamethoxazole-trimethoprim (BACTRIM DS) 800-160 MG tablet Take 1 tablet by mouth 2 (two) times daily. 10/10/20   Harle Stanford., PA-C    Allergies    Penicillins  Review of Systems   Review of Systems  Constitutional: Positive for chills, fatigue and fever.  HENT: Negative.   Eyes: Negative.   Respiratory: Positive for shortness of breath. Negative for cough, choking and chest tightness.   Cardiovascular: Positive for palpitations. Negative for chest pain.  Gastrointestinal: Positive for abdominal pain and nausea. Negative for diarrhea and vomiting.       Decreased appetite  Genitourinary: Positive for decreased urine volume, difficulty urinating, dysuria, flank pain, frequency and urgency. Negative for hematuria, vaginal bleeding, vaginal discharge and vaginal pain.  Musculoskeletal: Positive for myalgias.       Thigh pain  Skin: Positive for rash.  Allergic/Immunologic: Positive for immunocompromised state.       Undergoing tx for breast cancer  Neurological: Positive for weakness.       Peripheral neuropathy from chemotherapy  Psychiatric/Behavioral: Negative.     Physical  Exam Updated Vital Signs BP (!) 102/57   Pulse 87   Temp 99.1 F (37.3 C) (Oral)   Resp 16   Ht 5\' 10"  (1.778 m)   Wt 65.8 kg   SpO2 96%   BMI 20.81 kg/m   Physical Exam Vitals and nursing note reviewed.  Constitutional:      Appearance: She is normal weight. She is not toxic-appearing.  HENT:     Head: Normocephalic and atraumatic.     Nose: Nose normal.     Mouth/Throat:     Mouth: Mucous membranes are moist.     Pharynx: Oropharynx is clear. Uvula midline. No oropharyngeal exudate or posterior oropharyngeal erythema.     Tonsils: No tonsillar exudate.  Eyes:     General: Lids are normal. Vision grossly intact.        Right eye: No discharge.  Left eye: No discharge.     Extraocular Movements: Extraocular movements intact.     Conjunctiva/sclera: Conjunctivae normal.     Pupils: Pupils are equal, round, and reactive to light.  Neck:     Trachea: Trachea and phonation normal.  Cardiovascular:     Rate and Rhythm: Normal rate and regular rhythm.     Pulses: Normal pulses.     Heart sounds: Normal heart sounds. No murmur heard.   Pulmonary:     Effort: Pulmonary effort is normal. No tachypnea, bradypnea, accessory muscle usage, prolonged expiration or respiratory distress.     Breath sounds: Normal breath sounds. No decreased breath sounds, wheezing or rales.     Comments: Some SOB with conversation Chest:     Chest wall: No mass, lacerations, deformity, swelling, tenderness or crepitus.  Abdominal:     General: Bowel sounds are normal. There is no distension.     Palpations: Abdomen is soft.     Tenderness: There is abdominal tenderness in the periumbilical area and suprapubic area. There is right CVA tenderness and left CVA tenderness.  Musculoskeletal:        General: No deformity.     Cervical back: Normal range of motion and neck supple. No crepitus. No pain with movement, spinous process tenderness or muscular tenderness.     Right lower leg: No edema.      Left lower leg: No edema.  Lymphadenopathy:     Cervical: No cervical adenopathy.  Skin:    General: Skin is warm and dry.     Capillary Refill: Capillary refill takes less than 2 seconds.     Findings: Rash present. Rash is papular.       Neurological:     Mental Status: She is alert and oriented to person, place, and time. Mental status is at baseline.  Psychiatric:        Mood and Affect: Mood normal.     ED Results / Procedures / Treatments   Labs (all labs ordered are listed, but only abnormal results are displayed) Labs Reviewed  COMPREHENSIVE METABOLIC PANEL - Abnormal; Notable for the following components:      Result Value   Sodium 132 (*)    Glucose, Bld 121 (*)    Calcium 8.8 (*)    AST 51 (*)    ALT 46 (*)    Alkaline Phosphatase 140 (*)    Total Bilirubin 0.2 (*)    All other components within normal limits  CBC WITH DIFFERENTIAL/PLATELET - Abnormal; Notable for the following components:   WBC 20.0 (*)    RBC 3.44 (*)    Hemoglobin 11.4 (*)    HCT 34.2 (*)    Neutro Abs 17.2 (*)    Lymphs Abs 0.5 (*)    Monocytes Absolute 1.1 (*)    Abs Immature Granulocytes 0.97 (*)    All other components within normal limits  URINALYSIS, ROUTINE W REFLEX MICROSCOPIC - Abnormal; Notable for the following components:   APPearance HAZY (*)    Leukocytes,Ua TRACE (*)    All other components within normal limits  URINE CULTURE  CULTURE, BLOOD (ROUTINE X 2)  CULTURE, BLOOD (ROUTINE X 2)  LACTIC ACID, PLASMA  PROTIME-INR  APTT  HCG, QUANTITATIVE, PREGNANCY  LACTIC ACID, PLASMA    EKG EKG Interpretation  Date/Time:  Saturday October 15 2020 15:38:01 EDT Ventricular Rate:  87 PR Interval:  152 QRS Duration: 106 QT Interval:  361 QTC Calculation: 435 R Axis:  80 Text Interpretation: Sinus rhythm Consider left atrial enlargement RSR' in V1 or V2, right VCD or RVH No previous ECGs available Confirmed by Gareth Morgan [phone]) on 10/15/2020 4:43:52  PM   Radiology DG Chest Port 1 View  Result Date: 10/15/2020 CLINICAL DATA:  Questionable sepsis. EXAM: PORTABLE CHEST 1 VIEW COMPARISON:  July 12, 2020 FINDINGS: The left Port-A-Cath is stable. The heart, hila, mediastinum, lungs, and pleura are unremarkable. IMPRESSION: No active disease. Electronically Signed   By: Dorise Bullion III M.D   On: 10/15/2020 16:03    Procedures Procedures   Medications Ordered in ED Medications  levofloxacin (LEVAQUIN) IVPB 750 mg ( Intravenous Infusion Verify 10/15/20 1916)  lactated ringers bolus 1,000 mL (0 mLs Intravenous Stopped 10/15/20 1637)    ED Course  I have reviewed the triage vital signs and the nursing notes.  Pertinent labs & imaging results that were available during my care of the patient were reviewed by me and considered in my medical decision making (see chart for details).  Clinical Course as of 10/15/20 1955  Sat Oct 15, 2020  1703 Consult to pharmacist, Ulice Dash, [facility], who recommends dose of IV Levaquin in the emergency department.  If disposition should be discharged home, he recommends transition to oral Levaquin outpatient.  Due to concern for possible complicated UTI, will proceed with higher dose of 750 mg per dose.  [RS]  2841 Consult to oncologist who agrees with plan for dose of IV Levaquin here in the emergency department  and discharge home with oral antibiotics.  He suspects elevated leukocytosis likely secondary to dose of  Neulasta in the outpatient cancer center.  Appreciate his collaboration of care of this patient. [RS]    Clinical Course User Index [RS] Saeed Toren, Sharlene Dory   MDM Rules/Calculators/A&P                         60 year old female currently undergoing treatment for breast cancer presents with concern for fevers and chills at home; currently being treated for urinary tract infection.  Presents to the ED on recommendation of her oncologist for concern for febrile neutropenia.     Differential diagnosis includes but is not limited to urine tract infection,  pyelonephritis, bacteremia, febrile neutropenia.   Borderline febrile on intake with temperature of 99.1 F.  Vital signs otherwise normal.  Cardiopulmonary exam is normal, abdominal exam with mild generalized tenderness palpation worse in the suprapubic area.  There is mild CVA tenderness bilaterally.  CBC with leukocytosis of 20,000, CMP with very mild transaminitis.  Lactic acid is normal, 1.  UA with trace leukocytes otherwise unremarkable. Chest x-ray negative, EKG unremarkable.  Will administer dose of antibiotic IV, per pharmacy recommendation. Consult to oncology.  Consult oncology as above, patient has received dose of IV antibiotics in the emergency department.  She remains hemodynamically stable for discharge at this time.  Extreme leukocytosis likely secondary to recent Neulasta dosage in addition to urinary tract infection.  Will discharge with normal dose of Levaquin for urinary tract infection rather than high-dose previously discussed.  Teresa Sheppard voiced understanding of her medical evaluation and treatment plan.  Each of her questions was answered to her expressed satisfaction.  Return precautions were given.  Patient is stable and appropriate for discharge at this time.  This chart was dictated using voice recognition software, Dragon. Despite the best efforts of this provider to proofread and correct errors, errors may still occur which can change documentation  meaning.   Final Clinical Impression(s) / ED Diagnoses Final diagnoses:  None    Rx / DC Orders ED Discharge Orders    None       Aura Dials 10/15/20 Emeline Gins, MD 10/18/20 1139

## 2020-10-15 NOTE — ED Triage Notes (Signed)
Pt arrived via POV, states sent by Onc. Currently under tx for UTI. C/o body aches, fevers. Rash that was self resolved. States was seen at urgent care last night for same, results from blood work not back yet. Also states concern for medication interaction.

## 2020-10-15 NOTE — Discharge Instructions (Addendum)
You are seen in the ER today for your fever in the context of urinary tract infection.  Your blood work had significant elevated white blood cell count, however this likely contributed to by your recent Neulasta dose.  Your work-up was otherwise reassuring.  Your case was discussed with oncology who recommended dose of IV antibiotics in the emergency department which you received as well as prescription for new outpatient antibiotics at home.  Please do NOT take your Bactrim or nitrofurantoin as previously prescribed.  Begin to take the new antibiotic called levofloxacin.  Please be aware this may make your joints ache and you should avoid any strenuous activities for the duration of taking it.  Please call the cancer center first thing Monday morning for follow-up.  Return to the emergency department if you develop any new fevers despite antibiotic therapy, worsening abdominal pain, nausea or vomiting does not stop, or any other new severe symptoms.

## 2020-10-16 LAB — URINE CULTURE: Culture: NO GROWTH

## 2020-10-17 ENCOUNTER — Other Ambulatory Visit: Payer: Self-pay

## 2020-10-17 ENCOUNTER — Inpatient Hospital Stay: Payer: BC Managed Care – PPO | Admitting: Medical

## 2020-10-17 ENCOUNTER — Encounter: Payer: Self-pay | Admitting: *Deleted

## 2020-10-17 VITALS — BP 109/68 | HR 66 | Temp 98.1°F | Resp 18 | Ht 70.0 in | Wt 150.5 lb

## 2020-10-17 DIAGNOSIS — C50811 Malignant neoplasm of overlapping sites of right female breast: Secondary | ICD-10-CM

## 2020-10-17 DIAGNOSIS — Z170 Estrogen receptor positive status [ER+]: Secondary | ICD-10-CM

## 2020-10-17 NOTE — Progress Notes (Signed)
Received 4 after hour messages from pt with complaint of low grade fever, chills, body ache, and generalized body rash.  Pt was seen in ED over the weekend and was informed to f.u with oncologist Monday morning.  Per MD pt needing to be evaluated in [facility] today.  Apt scheduled and pt verbalized understanding of apt date and time. Pt requesting to be seen by MD as well on Thursday and will cancel apt if needed.

## 2020-10-18 ENCOUNTER — Telehealth: Payer: Self-pay | Admitting: *Deleted

## 2020-10-18 NOTE — Telephone Encounter (Signed)
Received call from pt stating axially temperature 99.4 and requesting advice from MD. RN advised pt to take temperature orally for more accuracy.  Oral temperature 99.1 and pt asymptomatic.  RN educated pt to monitor temperature every 4 hours.  If fever is greater then 100.5 call the office.  Pt verbalized understanding and appreciative of advice.

## 2020-10-19 NOTE — Progress Notes (Signed)
Patient Care Team: Wenda Low, MD as PCP - General (Internal Medicine) Rockwell Germany, RN as Oncology Nurse Navigator Mauro Kaufmann, RN as Oncology Nurse Navigator [name], MD as Consulting Physician (General Surgery) Nicholas Lose, MD as Consulting Physician (Hematology and Oncology) Kyung Rudd, MD as Consulting Physician (Radiation Oncology)  DIAGNOSIS:    ICD-10-CM   1. Malignant neoplasm of overlapping sites of right breast in female, estrogen receptor positive ([place])  C50.811    Z17.0     SUMMARY OF ONCOLOGIC HISTORY: Oncology History  Malignant neoplasm of overlapping sites of right breast in female, estrogen receptor positive ([place])  06/21/2020 Initial Diagnosis   Patient palpated a right breast mass x 1 wk. Mammogram showed multiple confluent masses in the right breast at the 7 o'clock position measuring 2.3cm and at the 9 o'clock position measuring 2.2cm with one right axillary lymph node with cortical thickening. Biopsy showed invasive mammary carcinoma at both positions and in the axilla, grade 2-3, HER-2 equivocal by IHC (2+), positive by FISH (ratio 3.34), ER+ >95%, PR+ 25%, Ki67 60%.   06/29/2020 Cancer Staging   Staging form: Breast, AJCC 8th Edition - Clinical stage from 06/29/2020: Stage IB (cT2, cN1(f), cM0, G3, ER+, PR+, HER2+) - Signed by Nicholas Lose, MD on 06/29/2020   07/13/2020 -  Chemotherapy    Patient is on Treatment Plan: BREAST  DOCETAXEL + CARBOPLATIN + TRASTUZUMAB + PERTUZUMAB  (TCHP) Q21D         CHIEF COMPLIANT: Completed 5 cycles ofTCH Perjeta, recent fevers  INTERVAL HISTORY: Teresa Sheppard is a 60 y.o. with above-mentioned history of right breast cancer currently on neoadjuvant chemotherapy with [place]. She was seen in the ED on 10/15/20 for fever and chills while being treated for a UTI and received IV antibiotics.  The urine analysis did not reveal any evidence of UTI.  She had profound fatigue which lasted for more days  than usual.  She was not able to exercise as she normally could previously.  She is going to attend a couple of graduation ceremony's in [place] and is concerned about her ability to handle the travel and also concerned about the risk of COVID-19.  Today she is feeling a lot better.  She has a mild discomfort in the back of her throat.  She denies any fevers.  Last night her T-max was 99.   ALLERGIES:  is allergic to penicillins.  MEDICATIONS:  Current Outpatient Medications  Medication Sig Dispense Refill  . Acetaminophen (TYLENOL) 325 MG CAPS Take 162.5-325 mg by mouth every 6 (six) hours as needed (headaches / pain).    . cetirizine (ZYRTEC) 10 MG tablet Take 10 mg by mouth daily as needed for allergies.    . cholecalciferol (VITAMIN D3) 25 MCG (1000 UNIT) tablet Take 1,000 Units by mouth daily.    Marland Kitchen dexamethasone (DECADRON) 4 MG tablet Take 1 tablet (4 mg total) by mouth 2 (two) times daily. Take 1 tablet day before chemo and 1 tablet a day after chemo with food (Patient taking differently: Take 4 mg by mouth See admin instructions. Take 1 tablet day before chemo and 1 tablet a day after chemo with food) 12 tablet 0  . fluticasone (FLONASE) 50 MCG/ACT nasal spray Place 1 spray into both nostrils daily as needed for allergies or rhinitis.    Marland Kitchen levofloxacin (LEVAQUIN) 250 MG tablet Take 1 tablet (250 mg total) by mouth daily for 5 days. 5 tablet 0  . lidocaine-prilocaine (  EMLA) cream Apply to affected area once (Patient taking differently: Apply 1 application topically daily as needed (port access).) 30 g 3  . LORazepam (ATIVAN) 0.5 MG tablet Take 1 tablet (0.5 mg total) by mouth at bedtime. As needed 30 tablet 1  . Omega-3 Fatty Acids (FISH OIL) 1000 MG CAPS Take 1,000 mg by mouth 3 (three) times a week.    . ondansetron (ZOFRAN) 8 MG tablet Take 1 tablet (8 mg total) by mouth 2 (two) times daily as needed (Nausea or vomiting). Start on the third day after chemotherapy. 30 tablet 1  .  phenazopyridine (PYRIDIUM) 200 MG tablet Take 1 tablet (200 mg total) by mouth 3 (three) times daily as needed for pain. 18 tablet 0  . Polyethyl Glycol-Propyl Glycol (SYSTANE OP) Place 1 drop into both eyes daily as needed (dry eyes).    . prochlorperazine (COMPAZINE) 10 MG tablet Take 1 tablet (10 mg total) by mouth every 6 (six) hours as needed (Nausea or vomiting). 30 tablet 1   No current facility-administered medications for this visit.    PHYSICAL EXAMINATION: ECOG PERFORMANCE STATUS: 1 - Symptomatic but completely ambulatory  Vitals:   10/20/20 0845  BP: 101/76  Pulse: 83  Resp: 18  Temp: (!) 97.2 F (36.2 C)  SpO2: 99%   Filed Weights   10/20/20 0845  Weight: 148 lb 1.6 oz (67.2 kg)    LABORATORY DATA:  I have reviewed the data as listed CMP Latest Ref Rng & Units 10/15/2020 10/04/2020 09/14/2020  Glucose 70 - 99 mg/dL 121(H) 96 83  BUN 6 - 20 mg/dL 12 15 16   Creatinine 0.44 - 1.00 mg/dL 0.73 0.67 0.75  Sodium 135 - 145 mmol/L 132(L) 143 142  Potassium 3.5 - 5.1 mmol/L 3.9 3.6 3.5  Chloride 98 - 111 mmol/L 99 108 108  CO2 22 - 32 mmol/L 24 23 24   Calcium 8.9 - 10.3 mg/dL 8.8(L) 8.9 9.1  Total Protein 6.5 - 8.1 g/dL 7.0 6.6 6.8  Total Bilirubin 0.3 - 1.2 mg/dL 0.2(L) 0.3 0.4  Alkaline Phos 38 - 126 U/L 140(H) 80 78  AST 15 - 41 U/L 51(H) 26 25  ALT 0 - 44 U/L 46(H) 22 25    Lab Results  Component Value Date   WBC 20.0 (H) 10/15/2020   HGB 11.4 (L) 10/15/2020   HCT 34.2 (L) 10/15/2020   MCV 99.4 10/15/2020   PLT 155 10/15/2020   NEUTROABS 17.2 (H) 10/15/2020    ASSESSMENT & PLAN:  Malignant neoplasm of overlapping sites of right breast in female, estrogen receptor positive ([place]) 06/21/2020:Patient palpated a right breast mass x 1 wk. Mammogram showed multiple confluent masses in the right breast at the 7 o'clock position measuring 2.3cm and at the 9 o'clock position measuring 2.2cm with one right axillary lymph node with cortical thickening. Biopsy showed  invasive mammary carcinoma at both positions and in the axilla, grade 2-3, HER-2 equivocal by IHC (2+), positive by FISH (ratio 3.34), ER+ >95%, PR+ 25%, Ki67 60%.  Treatment plan: 1. Neoadjuvant chemotherapy with TCH Perjeta 6 cycles followed by HerceptinPerjeta versus Kadcylamaintenance for 1 year 2. Followed by breast conserving surgery if possible with sentinel lymph node study 3. Followed by adjuvant radiation therapy if patient had lumpectomy 4.Followed by adjuvant antiestrogen therapy 5.Followed by neratinib ---------------------------------------------------------------------------------------------------------------------------------- Current treatment: completed Cycle5TCH Perjeta Echocardiogram 07/06/2020: EF 60 to 65%  Chemo toxicities: 1. Occasional loose stools 2.early findings of her peripheral neuropathy: We will keep the dose the same for  this treatment and we may have to consider reducing the dosage if her symptoms gets worse with the next cycle. 3.Chemo-induced alopecia 4.Chemo induced anemia: Hemoglobin today is 10.7 and we are monitoring it closely. 5. Fever with UTI symptoms: ED visit 10/18/20: I suspect that this is chemical irritation of the urinary system rather than a UTI.  The urine analysis did not identify any bacteria and the cultures were negative.  She is currently on ciprofloxacin. 6. Rash on dorsum of hands  MRI guided biopsy of the left breast: Benign, papillomatous lesion cannot be ruled out. Cycle 6 of TCHP on 10/26/20 Breast MRI 10/27/20 Patient has plans to attend graduation ceremony's [place].  We discussed at length about the precautions that she is going to take.  I strongly recommended that she attend the ceremonies.  I will see her next week with her cycle 6 of TCHP.   No orders of the defined types were placed in this encounter.  The patient has a good understanding of the overall plan. she agrees with it. she will call with  any problems that may develop before the next visit here.  Total time spent: 30 mins including face to face time and time spent for planning, charting and coordination of care  Rulon Eisenmenger, MD, MPH 10/20/2020  I, Molly Dorshimer, am acting as scribe for Dr. Nicholas Lose.  I have reviewed the above documentation for accuracy and completeness, and I agree with the above.

## 2020-10-20 ENCOUNTER — Other Ambulatory Visit: Payer: Self-pay

## 2020-10-20 ENCOUNTER — Inpatient Hospital Stay: Payer: BC Managed Care – PPO | Admitting: Hematology and Oncology

## 2020-10-20 DIAGNOSIS — Z170 Estrogen receptor positive status [ER+]: Secondary | ICD-10-CM

## 2020-10-20 DIAGNOSIS — C50811 Malignant neoplasm of overlapping sites of right female breast: Secondary | ICD-10-CM

## 2020-10-20 LAB — CULTURE, BLOOD (ROUTINE X 2)
Culture: NO GROWTH
Special Requests: ADEQUATE

## 2020-10-20 NOTE — Assessment & Plan Note (Signed)
06/21/2020:Patient palpated a right breast mass x 1 wk. Mammogram showed multiple confluent masses in the right breast at the 7 o'clock position measuring 2.3cm and at the 9 o'clock position measuring 2.2cm with one right axillary lymph node with cortical thickening. Biopsy showed invasive mammary carcinoma at both positions and in the axilla, grade 2-3, HER-2 equivocal by IHC (2+), positive by FISH (ratio 3.34), ER+ >95%, PR+ 25%, Ki67 60%.  Treatment plan: 1. Neoadjuvant chemotherapy with TCH Perjeta 6 cycles followed by HerceptinPerjeta versus Kadcylamaintenance for 1 year 2. Followed by breast conserving surgery if possible with sentinel lymph node study 3. Followed by adjuvant radiation therapy if patient had lumpectomy 4.Followed by adjuvant antiestrogen therapy 5.Followed by neratinib ---------------------------------------------------------------------------------------------------------------------------------- Current treatment: completed Cycle5TCH Perjeta Echocardiogram 07/06/2020: EF 60 to 65%  Chemo toxicities: 1. Occasional loose stools 2.early findings of her peripheral neuropathy: We will keep the dose the same for this treatment and we may have to consider reducing the dosage if her symptoms gets worse with the next cycle. 3.Chemo-induced alopecia 4.Chemo induced anemia: Hemoglobin today is 10.7 and we are monitoring it closely. 5. Fever with UTI symptoms: ED visit 10/18/20 6. Rash on dorsum of hands  MRI guided biopsy of the left breast: Benign, papillomatous lesion cannot be ruled out. Cycle 6 of TCHP on 10/26/20 Breast MRI 10/27/20

## 2020-10-21 ENCOUNTER — Telehealth: Payer: Self-pay | Admitting: *Deleted

## 2020-10-21 LAB — CULTURE, BLOOD (ROUTINE X 2)
Culture: NO GROWTH
Special Requests: ADEQUATE

## 2020-10-21 NOTE — Telephone Encounter (Signed)
Patient left me voicemail regarding some questions she had regarding whether or not she should see her GYN for her regular follow up and about the Levaquin that [name] gave her for future use. She states that it makes her stomach upset and cramping. She is currently on Macrobid. Left her a voicemail back that it is ok that she see her GYN and that [name] could change the rx to something else in the future regarding her UTI and she can discuss with him at her visit on 5/4.

## 2020-10-24 DIAGNOSIS — Z6821 Body mass index (BMI) 21.0-21.9, adult: Secondary | ICD-10-CM | POA: Diagnosis not present

## 2020-10-24 DIAGNOSIS — Z01419 Encounter for gynecological examination (general) (routine) without abnormal findings: Secondary | ICD-10-CM | POA: Diagnosis not present

## 2020-10-24 NOTE — Progress Notes (Signed)
Patient is seen in follow-up after being treated for UTI.  She was originally given Bactrim but then switched to [facility] after her culture results returned.  She presented to the emergency room on 10/15/2020 stating that she had had a fever of 100.4 axillary at home chills, myalgias, nausea and persistent urinary symptoms.  Macrobid was stopped and she was given Levaquin.  She presents to the clinic today now having a rash on her hands and legs.  She is concerned about taking Levaquin any longer.  She was told to stop Levaquin and restart Macrobid twice daily for 5 additional days.  She reports understanding and agreement with this plan.  Sandi Mealy, MHS, PA-C Physician Assistant

## 2020-10-25 ENCOUNTER — Telehealth: Payer: Self-pay

## 2020-10-25 ENCOUNTER — Other Ambulatory Visit: Payer: Self-pay

## 2020-10-25 DIAGNOSIS — Z01419 Encounter for gynecological examination (general) (routine) without abnormal findings: Secondary | ICD-10-CM | POA: Diagnosis not present

## 2020-10-25 DIAGNOSIS — Z170 Estrogen receptor positive status [ER+]: Secondary | ICD-10-CM

## 2020-10-25 DIAGNOSIS — C50811 Malignant neoplasm of overlapping sites of right female breast: Secondary | ICD-10-CM

## 2020-10-25 NOTE — Progress Notes (Signed)
Patient Care Team: Wenda Low, MD as PCP - General (Internal Medicine) Rockwell Germany, RN as Oncology Nurse Navigator Mauro Kaufmann, RN as Oncology Nurse Navigator [name], MD as Consulting Physician (General Surgery) Nicholas Lose, MD as Consulting Physician (Hematology and Oncology) Kyung Rudd, MD as Consulting Physician (Radiation Oncology)  DIAGNOSIS:    ICD-10-CM   1. Malignant neoplasm of overlapping sites of right breast in female, estrogen receptor positive ([place])  C50.811    Z17.0     SUMMARY OF ONCOLOGIC HISTORY: Oncology History  Malignant neoplasm of overlapping sites of right breast in female, estrogen receptor positive ([place])  06/21/2020 Initial Diagnosis   Patient palpated a right breast mass x 1 wk. Mammogram showed multiple confluent masses in the right breast at the 7 o'clock position measuring 2.3cm and at the 9 o'clock position measuring 2.2cm with one right axillary lymph node with cortical thickening. Biopsy showed invasive mammary carcinoma at both positions and in the axilla, grade 2-3, HER-2 equivocal by IHC (2+), positive by FISH (ratio 3.34), ER+ >95%, PR+ 25%, Ki67 60%.   06/29/2020 Cancer Staging   Staging form: Breast, AJCC 8th Edition - Clinical stage from 06/29/2020: Stage IB (cT2, cN1(f), cM0, G3, ER+, PR+, HER2+) - Signed by Nicholas Lose, MD on 06/29/2020   07/13/2020 -  Chemotherapy    Patient is on Treatment Plan: BREAST  DOCETAXEL + CARBOPLATIN + TRASTUZUMAB + PERTUZUMAB  (TCHP) Q21D         CHIEF COMPLIANT: Cycle 6TCH Perjeta  INTERVAL HISTORY: Teresa Sheppard is a 60 y.o. with above-mentioned history of right breast cancer currently on neoadjuvant chemotherapy with [place]. She presents to the clinic today for cycle 6.   ALLERGIES:  is allergic to shellfish-derived products and penicillins.  MEDICATIONS:  Current Outpatient Medications  Medication Sig Dispense Refill  . Acetaminophen (TYLENOL) 325 MG CAPS Take  162.5-325 mg by mouth every 6 (six) hours as needed (headaches / pain).    . cetirizine (ZYRTEC) 10 MG tablet Take 10 mg by mouth daily as needed for allergies.    . cholecalciferol (VITAMIN D3) 25 MCG (1000 UNIT) tablet Take 1,000 Units by mouth daily.    Marland Kitchen dexamethasone (DECADRON) 4 MG tablet Take 1 tablet (4 mg total) by mouth 2 (two) times daily. Take 1 tablet day before chemo and 1 tablet a day after chemo with food (Patient taking differently: Take 4 mg by mouth See admin instructions. Take 1 tablet day before chemo and 1 tablet a day after chemo with food) 12 tablet 0  . docetaxel (TAXOTERE) 20 MG/0.5ML injection Inject into the vein.    . hydrocortisone cream 1 % Cortisone (hydrocortisone) 1 % topical cream  APPLY A THIN LAYER TO THE AFFECTED AREA(S) BY TOPICAL ROUTE 2 TIMES PER DAY    . levofloxacin (LEVAQUIN) 250 MG tablet levofloxacin 250 mg tablet    . lidocaine-prilocaine (EMLA) cream Apply to affected area once (Patient taking differently: Apply 1 application topically daily as needed (port access).) 30 g 3  . LORazepam (ATIVAN) 0.5 MG tablet Take 1 tablet (0.5 mg total) by mouth at bedtime. As needed 30 tablet 1  . Magnesium 300 MG CAPS     . nitrofurantoin, macrocrystal-monohydrate, (MACROBID) 100 MG capsule nitrofurantoin monohydrate/macrocrystals 100 mg capsule    . Omega-3 Fatty Acids (FISH OIL) 500 MG CAPS     . ondansetron (ZOFRAN) 8 MG tablet Take 1 tablet (8 mg total) by mouth 2 (two) times daily as needed (  Nausea or vomiting). Start on the third day after chemotherapy. 30 tablet 1  . Polyethyl Glycol-Propyl Glycol (SYSTANE OP) Place 1 drop into both eyes daily as needed (dry eyes).    . Probiotic CHEW     . prochlorperazine (COMPAZINE) 10 MG tablet Take 1 tablet (10 mg total) by mouth every 6 (six) hours as needed (Nausea or vomiting). 30 tablet 1   No current facility-administered medications for this visit.   Facility-Administered Medications Ordered in Other Visits   Medication Dose Route Frequency Provider Last Rate Last Admin  . heparin lock flush 100 unit/mL  500 Units Intracatheter Once Nicholas Lose, MD        PHYSICAL EXAMINATION: ECOG PERFORMANCE STATUS: 1 - Symptomatic but completely ambulatory  There were no vitals filed for this visit. There were no vitals filed for this visit.   LABORATORY DATA:  I have reviewed the data as listed CMP Latest Ref Rng & Units 10/15/2020 10/04/2020 09/14/2020  Glucose 70 - 99 mg/dL 121(H) 96 83  BUN 6 - 20 mg/dL 12 15 16   Creatinine 0.44 - 1.00 mg/dL 0.73 0.67 0.75  Sodium 135 - 145 mmol/L 132(L) 143 142  Potassium 3.5 - 5.1 mmol/L 3.9 3.6 3.5  Chloride 98 - 111 mmol/L 99 108 108  CO2 22 - 32 mmol/L 24 23 24   Calcium 8.9 - 10.3 mg/dL 8.8(L) 8.9 9.1  Total Protein 6.5 - 8.1 g/dL 7.0 6.6 6.8  Total Bilirubin 0.3 - 1.2 mg/dL 0.2(L) 0.3 0.4  Alkaline Phos 38 - 126 U/L 140(H) 80 78  AST 15 - 41 U/L 51(H) 26 25  ALT 0 - 44 U/L 46(H) 22 25    Lab Results  Component Value Date   WBC 4.8 10/26/2020   HGB 10.1 (L) 10/26/2020   HCT 30.9 (L) 10/26/2020   MCV 99.7 10/26/2020   PLT 232 10/26/2020   NEUTROABS 2.1 10/26/2020    ASSESSMENT & PLAN:  Malignant neoplasm of overlapping sites of right breast in female, estrogen receptor positive ([place]) 06/21/2020:Patient palpated a right breast mass x 1 wk. Mammogram showed multiple confluent masses in the right breast at the 7 o'clock position measuring 2.3cm and at the 9 o'clock position measuring 2.2cm with one right axillary lymph node with cortical thickening. Biopsy showed invasive mammary carcinoma at both positions and in the axilla, grade 2-3, HER-2 equivocal by IHC (2+), positive by FISH (ratio 3.34), ER+ >95%, PR+ 25%, Ki67 60%.  Treatment plan: 1. Neoadjuvant chemotherapy with TCH Perjeta 6 cycles followed by HerceptinPerjeta versus Kadcylamaintenance for 1 year 2. Followed by breast conserving surgery if possible with sentinel lymph node study 3.  Followed by adjuvant radiation therapy if patient had lumpectomy 4.Followed by adjuvant antiestrogen therapy 5.Followed by neratinib ---------------------------------------------------------------------------------------------------------------------------------- Current treatment:   Cycle6TCH Perjeta Echocardiogram 07/06/2020: EF 60 to 65%  Chemo toxicities: 1. Occasional loose stools 2.early findings of her peripheral neuropathy: I reduce the dosage of Taxotere and carboplatin for cycle 6 3.Chemo-induced alopecia 4.Chemo induced anemia: Hemoglobin today is 10.1 and we are monitoring it closely. 5. Fever with UTI symptoms: ED visit 10/18/20: I suspect that this is chemical irritation of the urinary system rather than a UTI.  6. Rash on dorsum of hands: Resolved  MRI guided biopsy of the left breast: Benign, papillomatous lesion cannot be ruled out.   Breast MRI 10/27/20 Patient has plans to attend graduation ceremony's [place].  We discussed at length about the precautions that she is going to take.  I  strongly recommended that she attend the ceremonies. Return to clinic next week to discuss the results of the breast MRI    No orders of the defined types were placed in this encounter.  The patient has a good understanding of the overall plan. she agrees with it. she will call with any problems that may develop before the next visit here.  Total time spent: 30 mins including face to face time and time spent for planning, charting and coordination of care  Rulon Eisenmenger, MD, MPH 10/26/2020  I, Cloyde Reams Dorshimer, am acting as scribe for Dr. Nicholas Lose.  I have reviewed the above documentation for accuracy and completeness, and I agree with the above.

## 2020-10-25 NOTE — Telephone Encounter (Signed)
RN notified patient of echocardiogram appointment time and date.

## 2020-10-25 NOTE — Progress Notes (Signed)
Echocardiogram scheduled for 11/02/2020 @ 9am.

## 2020-10-25 NOTE — Assessment & Plan Note (Signed)
06/21/2020:Patient palpated a right breast mass x 1 wk. Mammogram showed multiple confluent masses in the right breast at the 7 o'clock position measuring 2.3cm and at the 9 o'clock position measuring 2.2cm with one right axillary lymph node with cortical thickening. Biopsy showed invasive mammary carcinoma at both positions and in the axilla, grade 2-3, HER-2 equivocal by IHC (2+), positive by FISH (ratio 3.34), ER+ >95%, PR+ 25%, Ki67 60%.  Treatment plan: 1. Neoadjuvant chemotherapy with TCH Perjeta 6 cycles followed by HerceptinPerjeta versus Kadcylamaintenance for 1 year 2. Followed by breast conserving surgery if possible with sentinel lymph node study 3. Followed by adjuvant radiation therapy if patient had lumpectomy 4.Followed by adjuvant antiestrogen therapy 5.Followed by neratinib ---------------------------------------------------------------------------------------------------------------------------------- Current treatment: completed Cycle6TCH Perjeta Echocardiogram 07/06/2020: EF 60 to 65%  Chemo toxicities: 1. Occasional loose stools 2.early findings of her peripheral neuropathy: We will keep the dose the same for this treatment and we may have to consider reducing the dosage if her symptoms gets worse with the next cycle. 3.Chemo-induced alopecia 4.Chemo induced anemia: Hemoglobin today is 10.7 and we are monitoring it closely. 5. Fever with UTI symptoms: ED visit 10/18/20: I suspect that this is chemical irritation of the urinary system rather than a UTI.  6. Rash on dorsum of hands  MRI guided biopsy of the left breast: Benign, papillomatous lesion cannot be ruled out. Cycle 6 of TCHP on 10/26/20 Breast MRI 10/27/20 Patient has plans to attend graduation ceremony's [place].  We discussed at length about the precautions that she is going to take.  I strongly recommended that she attend the ceremonies.

## 2020-10-26 ENCOUNTER — Other Ambulatory Visit: Payer: Self-pay

## 2020-10-26 ENCOUNTER — Inpatient Hospital Stay: Payer: BC Managed Care – PPO | Admitting: Hematology and Oncology

## 2020-10-26 ENCOUNTER — Inpatient Hospital Stay: Payer: BC Managed Care – PPO

## 2020-10-26 ENCOUNTER — Other Ambulatory Visit: Payer: BC Managed Care – PPO

## 2020-10-26 ENCOUNTER — Inpatient Hospital Stay: Payer: BC Managed Care – PPO | Attending: Hematology and Oncology

## 2020-10-26 DIAGNOSIS — C50811 Malignant neoplasm of overlapping sites of right female breast: Secondary | ICD-10-CM | POA: Insufficient documentation

## 2020-10-26 DIAGNOSIS — Z170 Estrogen receptor positive status [ER+]: Secondary | ICD-10-CM | POA: Insufficient documentation

## 2020-10-26 DIAGNOSIS — D6481 Anemia due to antineoplastic chemotherapy: Secondary | ICD-10-CM | POA: Diagnosis not present

## 2020-10-26 DIAGNOSIS — C773 Secondary and unspecified malignant neoplasm of axilla and upper limb lymph nodes: Secondary | ICD-10-CM | POA: Insufficient documentation

## 2020-10-26 DIAGNOSIS — D701 Agranulocytosis secondary to cancer chemotherapy: Secondary | ICD-10-CM | POA: Diagnosis not present

## 2020-10-26 DIAGNOSIS — Z5111 Encounter for antineoplastic chemotherapy: Secondary | ICD-10-CM | POA: Diagnosis not present

## 2020-10-26 DIAGNOSIS — G620 Drug-induced polyneuropathy: Secondary | ICD-10-CM | POA: Insufficient documentation

## 2020-10-26 DIAGNOSIS — Z5112 Encounter for antineoplastic immunotherapy: Secondary | ICD-10-CM | POA: Insufficient documentation

## 2020-10-26 DIAGNOSIS — R1310 Dysphagia, unspecified: Secondary | ICD-10-CM | POA: Insufficient documentation

## 2020-10-26 DIAGNOSIS — Z95828 Presence of other vascular implants and grafts: Secondary | ICD-10-CM | POA: Insufficient documentation

## 2020-10-26 LAB — CBC WITH DIFFERENTIAL/PLATELET
Abs Immature Granulocytes: 0.03 10*3/uL (ref 0.00–0.07)
Basophils Absolute: 0.1 10*3/uL (ref 0.0–0.1)
Basophils Relative: 1 %
Eosinophils Absolute: 0.2 10*3/uL (ref 0.0–0.5)
Eosinophils Relative: 5 %
HCT: 30.9 % — ABNORMAL LOW (ref 36.0–46.0)
Hemoglobin: 10.1 g/dL — ABNORMAL LOW (ref 12.0–15.0)
Immature Granulocytes: 1 %
Lymphocytes Relative: 34 %
Lymphs Abs: 1.6 10*3/uL (ref 0.7–4.0)
MCH: 32.6 pg (ref 26.0–34.0)
MCHC: 32.7 g/dL (ref 30.0–36.0)
MCV: 99.7 fL (ref 80.0–100.0)
Monocytes Absolute: 0.8 10*3/uL (ref 0.1–1.0)
Monocytes Relative: 16 %
Neutro Abs: 2.1 10*3/uL (ref 1.7–7.7)
Neutrophils Relative %: 43 %
Platelets: 232 10*3/uL (ref 150–400)
RBC: 3.1 MIL/uL — ABNORMAL LOW (ref 3.87–5.11)
RDW: 13.4 % (ref 11.5–15.5)
WBC: 4.8 10*3/uL (ref 4.0–10.5)
nRBC: 0 % (ref 0.0–0.2)

## 2020-10-26 LAB — COMPREHENSIVE METABOLIC PANEL
ALT: 60 U/L — ABNORMAL HIGH (ref 0–44)
AST: 33 U/L (ref 15–41)
Albumin: 3.5 g/dL (ref 3.5–5.0)
Alkaline Phosphatase: 109 U/L (ref 38–126)
Anion gap: 10 (ref 5–15)
BUN: 20 mg/dL (ref 6–20)
CO2: 28 mmol/L (ref 22–32)
Calcium: 9.2 mg/dL (ref 8.9–10.3)
Chloride: 104 mmol/L (ref 98–111)
Creatinine, Ser: 0.72 mg/dL (ref 0.44–1.00)
GFR, Estimated: >60 mL/min (ref >60)
Glucose, Bld: 90 mg/dL (ref 70–99)
Potassium: 3.6 mmol/L (ref 3.5–5.1)
Sodium: 142 mmol/L (ref 135–145)
Total Bilirubin: 0.3 mg/dL (ref 0.3–1.2)
Total Protein: 6.9 g/dL (ref 6.5–8.1)

## 2020-10-26 MED ORDER — TRASTUZUMAB-DKST CHEMO 150 MG IV SOLR
6.0000 mg/kg | Freq: Once | INTRAVENOUS | Status: AC
  Administered 2020-10-26: 399 mg via INTRAVENOUS
  Filled 2020-10-26: qty 19

## 2020-10-26 MED ORDER — ACETAMINOPHEN 325 MG PO TABS
650.0000 mg | ORAL_TABLET | Freq: Once | ORAL | Status: AC
Start: 2020-10-26 — End: 2020-10-26
  Administered 2020-10-26: 650 mg via ORAL

## 2020-10-26 MED ORDER — SODIUM CHLORIDE 0.9 % IV SOLN
550.0000 mg | Freq: Once | INTRAVENOUS | Status: AC
  Administered 2020-10-26: 550 mg via INTRAVENOUS
  Filled 2020-10-26: qty 55

## 2020-10-26 MED ORDER — SODIUM CHLORIDE 0.9 % IV SOLN
10.0000 mg | Freq: Once | INTRAVENOUS | Status: AC
  Administered 2020-10-26: 10 mg via INTRAVENOUS
  Filled 2020-10-26: qty 10

## 2020-10-26 MED ORDER — SODIUM CHLORIDE 0.9 % IV SOLN
150.0000 mg | Freq: Once | INTRAVENOUS | Status: AC
  Administered 2020-10-26: 150 mg via INTRAVENOUS
  Filled 2020-10-26: qty 150

## 2020-10-26 MED ORDER — SODIUM CHLORIDE 0.9 % IV SOLN
420.0000 mg | Freq: Once | INTRAVENOUS | Status: AC
  Administered 2020-10-26: 420 mg via INTRAVENOUS
  Filled 2020-10-26: qty 14

## 2020-10-26 MED ORDER — HEPARIN SOD (PORK) LOCK FLUSH 100 UNIT/ML IV SOLN
500.0000 [IU] | Freq: Once | INTRAVENOUS | Status: AC | PRN
  Administered 2020-10-26: 500 [IU]
  Filled 2020-10-26: qty 5

## 2020-10-26 MED ORDER — PALONOSETRON HCL INJECTION 0.25 MG/5ML
0.2500 mg | Freq: Once | INTRAVENOUS | Status: AC
Start: 2020-10-26 — End: 2020-10-26
  Administered 2020-10-26: 0.25 mg via INTRAVENOUS

## 2020-10-26 MED ORDER — PALONOSETRON HCL INJECTION 0.25 MG/5ML
INTRAVENOUS | Status: AC
  Filled 2020-10-26: qty 5

## 2020-10-26 MED ORDER — SODIUM CHLORIDE 0.9 % IV SOLN
65.0000 mg/m2 | Freq: Once | INTRAVENOUS | Status: AC
  Administered 2020-10-26: 120 mg via INTRAVENOUS
  Filled 2020-10-26: qty 12

## 2020-10-26 MED ORDER — DIPHENHYDRAMINE HCL 25 MG PO CAPS
50.0000 mg | ORAL_CAPSULE | Freq: Once | ORAL | Status: AC
  Administered 2020-10-26: 50 mg via ORAL

## 2020-10-26 MED ORDER — SODIUM CHLORIDE 0.9% FLUSH
10.0000 mL | Freq: Once | INTRAVENOUS | Status: AC
  Administered 2020-10-26: 10 mL
  Filled 2020-10-26: qty 10

## 2020-10-26 MED ORDER — DIPHENHYDRAMINE HCL 25 MG PO CAPS
ORAL_CAPSULE | ORAL | Status: AC
  Filled 2020-10-26: qty 2

## 2020-10-26 MED ORDER — SODIUM CHLORIDE 0.9% FLUSH
10.0000 mL | INTRAVENOUS | Status: DC | PRN
  Administered 2020-10-26: 10 mL
  Filled 2020-10-26: qty 10

## 2020-10-26 MED ORDER — ACETAMINOPHEN 325 MG PO TABS
ORAL_TABLET | ORAL | Status: AC
  Filled 2020-10-26: qty 2

## 2020-10-26 MED ORDER — HEPARIN SOD (PORK) LOCK FLUSH 100 UNIT/ML IV SOLN
500.0000 [IU] | Freq: Once | INTRAVENOUS | Status: DC
  Filled 2020-10-26: qty 5

## 2020-10-26 MED ORDER — SODIUM CHLORIDE 0.9 % IV SOLN
Freq: Once | INTRAVENOUS | Status: AC
Start: 2020-10-26 — End: 2020-10-26
  Filled 2020-10-26: qty 250

## 2020-10-26 NOTE — Patient Instructions (Signed)
[facility]  Discharge Instructions: °Thank you for choosing [facility] to provide your oncology and hematology care.  ° °If you have a lab appointment with the Cancer Center, please go directly to the Cancer Center and check in at the registration area. °  °Wear comfortable clothing and clothing appropriate for easy access to any Portacath or PICC line.  ° °We strive to give you quality time with your provider. You may need to reschedule your appointment if you arrive late (15 or more minutes).  Arriving late affects you and other patients whose appointments are after yours.  Also, if you miss three or more appointments without notifying the office, you may be dismissed from the clinic at the provider’s discretion.    °  °For prescription refill requests, have your pharmacy contact our office and allow 72 hours for refills to be completed.   ° °Today you received the following chemotherapy and/or immunotherapy agents TCHP °  °To help prevent nausea and vomiting after your treatment, we encourage you to take your nausea medication as directed. ° °BELOW ARE SYMPTOMS THAT SHOULD BE REPORTED IMMEDIATELY: °*FEVER GREATER THAN 100.4 F (38 °C) OR HIGHER °*CHILLS OR SWEATING °*NAUSEA AND VOMITING THAT IS NOT CONTROLLED WITH YOUR NAUSEA MEDICATION °*UNUSUAL SHORTNESS OF BREATH °*UNUSUAL BRUISING OR BLEEDING °*URINARY PROBLEMS (pain or burning when urinating, or frequent urination) °*BOWEL PROBLEMS (unusual diarrhea, constipation, pain near the anus) °TENDERNESS IN MOUTH AND THROAT WITH OR WITHOUT PRESENCE OF ULCERS (sore throat, sores in mouth, or a toothache) °UNUSUAL RASH, SWELLING OR PAIN  °UNUSUAL VAGINAL DISCHARGE OR ITCHING  ° °Items with * indicate a potential emergency and should be followed up as soon as possible or go to the Emergency Department if any problems should occur. ° °Please show the CHEMOTHERAPY ALERT CARD or IMMUNOTHERAPY ALERT CARD at check-in to the  Emergency Department and triage nurse. ° °Should you have questions after your visit or need to cancel or reschedule your appointment, please contact [facility]  Dept: [phone]  and follow the prompts.  Office hours are 8:00 a.m. to 4:30 p.m. Monday - Friday. Please note that voicemails left after 4:00 p.m. may not be returned until the following business day.  We are closed weekends and major holidays. You have access to a nurse at all times for urgent questions. Please call the main number to the clinic Dept: [phone] and follow the prompts. ° ° °For any non-urgent questions, you may also contact your provider using MyChart. We now offer e-Visits for anyone 18 and older to request care online for non-urgent symptoms. For details visit [email or web address]. °  °Also download the MyChart app! Go to the app store, search "MyChart", open the app, select [facility], and log in with your MyChart username and password. ° °Due to Covid, a mask is required upon entering the hospital/clinic. If you do not have a mask, one will be given to you upon arrival. For doctor visits, patients may have 1 support person aged 18 or older with them. For treatment visits, patients cannot have anyone with them due to current Covid guidelines and our immunocompromised population.  ° °

## 2020-10-27 ENCOUNTER — Ambulatory Visit
Admission: RE | Admit: 2020-10-27 | Discharge: 2020-10-27 | Disposition: A | Discharge status: Home / Self Care | Payer: BC Managed Care – PPO | Source: Ambulatory Visit | Attending: Hematology and Oncology | Admitting: Hematology and Oncology

## 2020-10-27 ENCOUNTER — Encounter: Payer: Self-pay | Admitting: *Deleted

## 2020-10-27 DIAGNOSIS — C50911 Malignant neoplasm of unspecified site of right female breast: Secondary | ICD-10-CM | POA: Diagnosis not present

## 2020-10-27 DIAGNOSIS — C50811 Malignant neoplasm of overlapping sites of right female breast: Secondary | ICD-10-CM

## 2020-10-27 DIAGNOSIS — Z170 Estrogen receptor positive status [ER+]: Secondary | ICD-10-CM

## 2020-10-27 MED ORDER — GADOBUTROL 1 MMOL/ML IV SOLN
7.0000 mL | Freq: Once | INTRAVENOUS | Status: AC | PRN
  Administered 2020-10-27: 7 mL via INTRAVENOUS

## 2020-10-28 ENCOUNTER — Inpatient Hospital Stay: Payer: BC Managed Care – PPO

## 2020-10-30 NOTE — Progress Notes (Signed)
Patient Care Team: Wenda Low, MD as PCP - General (Internal Medicine) Rockwell Germany, RN as Oncology Nurse Navigator Mauro Kaufmann, RN as Oncology Nurse Navigator [name], MD as Consulting Physician (General Surgery) Nicholas Lose, MD as Consulting Physician (Hematology and Oncology) Kyung Rudd, MD as Consulting Physician (Radiation Oncology)  DIAGNOSIS:    ICD-10-CM   1. Malignant neoplasm of overlapping sites of right breast in female, estrogen receptor positive ([place])  C50.811    Z17.0     SUMMARY OF ONCOLOGIC HISTORY: Oncology History  Malignant neoplasm of overlapping sites of right breast in female, estrogen receptor positive ([place])  06/21/2020 Initial Diagnosis   Patient palpated a right breast mass x 1 wk. Mammogram showed multiple confluent masses in the right breast at the 7 o'clock position measuring 2.3cm and at the 9 o'clock position measuring 2.2cm with one right axillary lymph node with cortical thickening. Biopsy showed invasive mammary carcinoma at both positions and in the axilla, grade 2-3, HER-2 equivocal by IHC (2+), positive by FISH (ratio 3.34), ER+ >95%, PR+ 25%, Ki67 60%.   06/29/2020 Cancer Staging   Staging form: Breast, AJCC 8th Edition - Clinical stage from 06/29/2020: Stage IB (cT2, cN1(f), cM0, G3, ER+, PR+, HER2+) - Signed by Nicholas Lose, MD on 06/29/2020   07/13/2020 -  Chemotherapy    Patient is on Treatment Plan: BREAST  DOCETAXEL + CARBOPLATIN + TRASTUZUMAB + PERTUZUMAB  (TCHP) Q21D       10/28/2020 Breast MRI   Complete imaging response     CHIEF COMPLIANT: Follow-up to review breast MRI  INTERVAL HISTORY: Teresa Sheppard is a 60 y.o. with above-mentioned history of right breast cancerwho completed neoadjuvant chemotherapy with [place]. Breast MRI on 10/27/20 showed complete response to therapy and no evidence of the known right breast masses.She presents to the clinic today for cycle 6.    ALLERGIES:  is allergic to  shellfish-derived products and penicillins.  MEDICATIONS:  Current Outpatient Medications  Medication Sig Dispense Refill  . Acetaminophen (TYLENOL) 325 MG CAPS Take 162.5-325 mg by mouth every 6 (six) hours as needed (headaches / pain).    . cetirizine (ZYRTEC) 10 MG tablet Take 10 mg by mouth daily as needed for allergies.    . cholecalciferol (VITAMIN D3) 25 MCG (1000 UNIT) tablet Take 1,000 Units by mouth daily.    Marland Kitchen dexamethasone (DECADRON) 4 MG tablet Take 1 tablet (4 mg total) by mouth 2 (two) times daily. Take 1 tablet day before chemo and 1 tablet a day after chemo with food (Patient taking differently: Take 4 mg by mouth See admin instructions. Take 1 tablet day before chemo and 1 tablet a day after chemo with food) 12 tablet 0  . docetaxel (TAXOTERE) 20 MG/0.5ML injection Inject into the vein.    . hydrocortisone cream 1 % Cortisone (hydrocortisone) 1 % topical cream  APPLY A THIN LAYER TO THE AFFECTED AREA(S) BY TOPICAL ROUTE 2 TIMES PER DAY    . levofloxacin (LEVAQUIN) 250 MG tablet levofloxacin 250 mg tablet    . lidocaine-prilocaine (EMLA) cream Apply to affected area once (Patient taking differently: Apply 1 application topically daily as needed (port access).) 30 g 3  . LORazepam (ATIVAN) 0.5 MG tablet Take 1 tablet (0.5 mg total) by mouth at bedtime. As needed 30 tablet 1  . Magnesium 300 MG CAPS     . nitrofurantoin, macrocrystal-monohydrate, (MACROBID) 100 MG capsule nitrofurantoin monohydrate/macrocrystals 100 mg capsule    . Omega-3 Fatty Acids (  FISH OIL) 500 MG CAPS     . ondansetron (ZOFRAN) 8 MG tablet Take 1 tablet (8 mg total) by mouth 2 (two) times daily as needed (Nausea or vomiting). Start on the third day after chemotherapy. 30 tablet 1  . Polyethyl Glycol-Propyl Glycol (SYSTANE OP) Place 1 drop into both eyes daily as needed (dry eyes).    . Probiotic CHEW     . prochlorperazine (COMPAZINE) 10 MG tablet Take 1 tablet (10 mg total) by mouth every 6 (six) hours as  needed (Nausea or vomiting). 30 tablet 1   No current facility-administered medications for this visit.    PHYSICAL EXAMINATION: ECOG PERFORMANCE STATUS: 1 - Symptomatic but completely ambulatory  Vitals:   10/31/20 1208  BP: 114/62  Pulse: 71  Resp: 17  Temp: 97.7 F (36.5 C)  SpO2: 100%   Filed Weights   10/31/20 1208  Weight: 147 lb 4.8 oz (66.8 kg)     LABORATORY DATA:  I have reviewed the data as listed CMP Latest Ref Rng & Units 10/26/2020 10/15/2020 10/04/2020  Glucose 70 - 99 mg/dL 90 121(H) 96  BUN 6 - 20 mg/dL 20 12 15   Creatinine 0.44 - 1.00 mg/dL 0.72 0.73 0.67  Sodium 135 - 145 mmol/L 142 132(L) 143  Potassium 3.5 - 5.1 mmol/L 3.6 3.9 3.6  Chloride 98 - 111 mmol/L 104 99 108  CO2 22 - 32 mmol/L 28 24 23   Calcium 8.9 - 10.3 mg/dL 9.2 8.8(L) 8.9  Total Protein 6.5 - 8.1 g/dL 6.9 7.0 6.6  Total Bilirubin 0.3 - 1.2 mg/dL 0.3 0.2(L) 0.3  Alkaline Phos 38 - 126 U/L 109 140(H) 80  AST 15 - 41 U/L 33 51(H) 26  ALT 0 - 44 U/L 60(H) 46(H) 22    Lab Results  Component Value Date   WBC 4.8 10/26/2020   HGB 10.1 (L) 10/26/2020   HCT 30.9 (L) 10/26/2020   MCV 99.7 10/26/2020   PLT 232 10/26/2020   NEUTROABS 2.1 10/26/2020    ASSESSMENT & PLAN:  Malignant neoplasm of overlapping sites of right breast in female, estrogen receptor positive ([place]) 06/21/2020:Patient palpated a right breast mass x 1 wk. Mammogram showed multiple confluent masses in the right breast at the 7 o'clock position measuring 2.3cm and at the 9 o'clock position measuring 2.2cm with one right axillary lymph node with cortical thickening. Biopsy showed invasive mammary carcinoma at both positions and in the axilla, grade 2-3, HER-2 equivocal by IHC (2+), positive by FISH (ratio 3.34), ER+ >95%, PR+ 25%, Ki67 60%.  Treatment plan: 1. Neoadjuvant chemotherapy with TCH Perjeta 6 cycles completed 10/26/2020 followed by HerceptinPerjeta versus Kadcylamaintenance for 1 year 2. Followed by breast  conserving surgery if possible with sentinel lymph node study (she has not made up her mind regarding lumpectomy versus mastectomy versus bilateral mastectomies) 3. Followed by adjuvant radiation therapy if patient had lumpectomy 4.Followed by adjuvant antiestrogen therapy 5.Followed by neratinib ---------------------------------------------------------------------------------------------------------------------------------- Breast MRI 10/27/20: Complete imaging response Patient will discuss the possibility for lumpectomy with Dr. [name].  Patient has plans to attend graduation ceremony's [place].  Return to clinic for Herceptin and Perjeta maintenance.     No orders of the defined types were placed in this encounter.  The patient has a good understanding of the overall plan. she agrees with it. she will call with any problems that may develop before the next visit here.  Total time spent: 30 mins including face to face time and time spent for  planning, charting and coordination of care  Rulon Eisenmenger, MD, MPH 10/31/2020  I, Molly Dorshimer, am acting as scribe for Dr. Nicholas Lose.  I have reviewed the above documentation for accuracy and completeness, and I agree with the above.

## 2020-10-31 ENCOUNTER — Other Ambulatory Visit: Payer: Self-pay

## 2020-10-31 ENCOUNTER — Inpatient Hospital Stay (HOSPITAL_BASED_OUTPATIENT_CLINIC_OR_DEPARTMENT_OTHER): Payer: BC Managed Care – PPO | Admitting: Hematology and Oncology

## 2020-10-31 ENCOUNTER — Encounter: Payer: Self-pay | Admitting: *Deleted

## 2020-10-31 DIAGNOSIS — Z170 Estrogen receptor positive status [ER+]: Secondary | ICD-10-CM

## 2020-10-31 DIAGNOSIS — C50811 Malignant neoplasm of overlapping sites of right female breast: Secondary | ICD-10-CM

## 2020-10-31 NOTE — Assessment & Plan Note (Signed)
06/21/2020:Patient palpated a right breast mass x 1 wk. Mammogram showed multiple confluent masses in the right breast at the 7 o'clock position measuring 2.3cm and at the 9 o'clock position measuring 2.2cm with one right axillary lymph node with cortical thickening. Biopsy showed invasive mammary carcinoma at both positions and in the axilla, grade 2-3, HER-2 equivocal by IHC (2+), positive by FISH (ratio 3.34), ER+ >95%, PR+ 25%, Ki67 60%.  Treatment plan: 1. Neoadjuvant chemotherapy with TCH [place] 6 cycles completed 10/26/2020 followed by HerceptinPerjeta versus Kadcylamaintenance for 1 year 2. Followed by breast conserving surgery if possible with sentinel lymph node study 3. Followed by adjuvant radiation therapy if patient had lumpectomy 4.Followed by adjuvant antiestrogen therapy 5.Followed by neratinib ---------------------------------------------------------------------------------------------------------------------------------- Breast MRI 10/27/20: Complete imaging response Patient will discuss the possibility for lumpectomy with Dr. [name].  Patient has plans to attend graduation ceremony's [place].  Return to clinic for Herceptin and Perjeta maintenance.

## 2020-11-02 ENCOUNTER — Other Ambulatory Visit: Payer: Self-pay

## 2020-11-02 ENCOUNTER — Encounter: Payer: Self-pay | Admitting: Plastic Surgery

## 2020-11-02 ENCOUNTER — Ambulatory Visit (HOSPITAL_COMMUNITY)
Admission: RE | Admit: 2020-11-02 | Discharge: 2020-11-02 | Disposition: A | Discharge status: Home / Self Care | Payer: BC Managed Care – PPO | Source: Ambulatory Visit | Attending: Hematology and Oncology | Admitting: Hematology and Oncology

## 2020-11-02 ENCOUNTER — Ambulatory Visit (INDEPENDENT_AMBULATORY_CARE_PROVIDER_SITE_OTHER): Payer: BC Managed Care – PPO | Admitting: Plastic Surgery

## 2020-11-02 VITALS — BP 115/70 | HR 70 | Ht 69.5 in | Wt 149.0 lb

## 2020-11-02 DIAGNOSIS — Z170 Estrogen receptor positive status [ER+]: Secondary | ICD-10-CM

## 2020-11-02 DIAGNOSIS — Z0189 Encounter for other specified special examinations: Secondary | ICD-10-CM | POA: Diagnosis not present

## 2020-11-02 DIAGNOSIS — C50811 Malignant neoplasm of overlapping sites of right female breast: Secondary | ICD-10-CM

## 2020-11-02 DIAGNOSIS — C50919 Malignant neoplasm of unspecified site of unspecified female breast: Secondary | ICD-10-CM | POA: Insufficient documentation

## 2020-11-02 DIAGNOSIS — Z01810 Encounter for preprocedural cardiovascular examination: Secondary | ICD-10-CM | POA: Diagnosis not present

## 2020-11-02 LAB — ECHOCARDIOGRAM COMPLETE
Area-P 1/2: 3.65 cm2
S' Lateral: 3.25 cm

## 2020-11-02 NOTE — Progress Notes (Signed)
Referring Provider Wenda Low, MD [place] [address] [place],  [place] [address]   CC:  Chief Complaint  Patient presents with  . Advice Only      Teresa Sheppard is an 60 y.o. female.  HPI: Patient presents to discuss her options for breast reconstruction.  Right breast cancer was found and she is undergoing neoadjuvant chemotherapy.  If positive lymph node was found initially in the right axilla with needle biopsy as well.  She has had a great response to neoadjuvant chemotherapy on imaging.  She is currently trying to decide on how to proceed with her surgical treatment of her breast cancer.  She is considered lumpectomy and mastectomy in addition to bilateral mastectomies.  It sounds like she also had a suspicious lesion that was biopsied on the left breast but this was not cancer.  Additionally she has had quite a few family members have had breast cancer and she is cognizant of that in her decision making.  Allergies  Allergen Reactions  . Shellfish-Derived Products Anaphylaxis    Per patient whenever she consumes a lot gives tightness of throat  . Penicillins Rash    Ancef tolerated well. No adverse reaction    Outpatient Encounter Medications as of 11/02/2020  Medication Sig Note  . Acetaminophen (TYLENOL) 325 MG CAPS Take 162.5-325 mg by mouth every 6 (six) hours as needed (headaches / pain).   . cetirizine (ZYRTEC) 10 MG tablet Take 10 mg by mouth daily as needed for allergies.   . cholecalciferol (VITAMIN D3) 25 MCG (1000 UNIT) tablet Take 1,000 Units by mouth daily.   Marland Kitchen dexamethasone (DECADRON) 4 MG tablet Take 1 tablet (4 mg total) by mouth 2 (two) times daily. Take 1 tablet day before chemo and 1 tablet a day after chemo with food (Patient taking differently: Take 4 mg by mouth See admin instructions. Take 1 tablet day before chemo and 1 tablet a day after chemo with food) 07/05/2020: Hasnt started  . docetaxel (TAXOTERE) 20 MG/0.5ML injection Inject into the  vein.   . hydrocortisone cream 1 % Cortisone (hydrocortisone) 1 % topical cream  APPLY A THIN LAYER TO THE AFFECTED AREA(S) BY TOPICAL ROUTE 2 TIMES PER DAY   . levofloxacin (LEVAQUIN) 250 MG tablet levofloxacin 250 mg tablet   . lidocaine-prilocaine (EMLA) cream Apply to affected area once (Patient taking differently: Apply 1 application topically daily as needed (port access).) 07/05/2020: Hasnt started  . LORazepam (ATIVAN) 0.5 MG tablet Take 1 tablet (0.5 mg total) by mouth at bedtime. As needed   . Magnesium 300 MG CAPS    . nitrofurantoin, macrocrystal-monohydrate, (MACROBID) 100 MG capsule nitrofurantoin monohydrate/macrocrystals 100 mg capsule   . Omega-3 Fatty Acids (FISH OIL) 500 MG CAPS    . ondansetron (ZOFRAN) 8 MG tablet Take 1 tablet (8 mg total) by mouth 2 (two) times daily as needed (Nausea or vomiting). Start on the third day after chemotherapy. 07/05/2020: Hasnt started  . Polyethyl Glycol-Propyl Glycol (SYSTANE OP) Place 1 drop into both eyes daily as needed (dry eyes).   . Probiotic CHEW    . prochlorperazine (COMPAZINE) 10 MG tablet Take 1 tablet (10 mg total) by mouth every 6 (six) hours as needed (Nausea or vomiting). 07/05/2020: Hasnt started   No facility-administered encounter medications on file as of 11/02/2020.     Past Medical History:  Diagnosis Date  . Anxiety   . Breast cancer in female [facility])    Right  . PONV (  postoperative nausea and vomiting)   . Thyromegaly     Past Surgical History:  Procedure Laterality Date  . ABDOMINAL WALL DEFECT REPAIR     following hernia repair in 2010  . APPENDECTOMY    . BOWEL RESECTION     2008  while pregnant with last child  . CESAREAN SECTION    . [place] OF UTERUS     2000 and 2014  . HERNIA REPAIR    . PORTACATH PLACEMENT Left 07/12/2020   Procedure: INSERTION PORT-A-CATH WITH ULTRASOUND GUIDANCE;  Surgeon: [name], MD;  Location: [facility];  Service: General;  Laterality: Left;  . ROOT CANAL      2019  . URETERAL EXPLORATION     dilataion for congenitally small ureter    Family History  Problem Relation Age of Onset  . Breast cancer Mother 23  . Breast cancer Sister 35  . Breast cancer Maternal Aunt   . Pancreatic cancer Father     Social History   Social History Narrative  . Not on file  Denies tobacco use  Review of Systems General: Denies fevers, chills, weight loss CV: Denies chest pain, shortness of breath, palpitations  Physical Exam Vitals with BMI 11/02/2020 10/31/2020 10/26/2020  Height 5' 9.5" 5\' 10"  5\' 10"   Weight 149 lbs 147 lbs 5 oz 149 lbs 13 oz  BMI 21.7 37.85 88.50  Systolic 277 412 878  Diastolic 70 62 60  Pulse 70 71 74    General:  No acute distress,  Alert and oriented, Non-Toxic, Normal speech and affect Breast: She has grade 2 ptosis.  There is mild-moderate excess skin and she is probably around a B cup in volume.  No obvious scars.  She is fairly symmetric in shape and size and nipple position.  Assessment/Plan I had a long discussion with the patient about her options for breast reconstruction.  We talked through autologous options for her which would be a little bit limited based on her history of abdominal operations.  She would be a candidate for a latissimus flap but is hesitant to risk any functional limitations due to her exercise activities.  We talked quite a bit about implant-based reconstruction in the setting of either unilateral or bilateral mastectomy.  We discussed the expansion process and switching to a gel implant.  I discussed the risk of that procedure that include bleeding, infection, damage to surrounding structures and need for additional procedures.  We discussed potential renal complications that would result in loss of the implant.  She did seem a bit hesitant to proceed with that based on the need for multiple operations and the risk of complications.  We also discussed doing an aesthetic flat closure operation where the  nipple was preserved either with a graft or in a pedicled fashion.  She seemed interested in this concept.  She has another meeting with Dr. [name] tomorrow to talk through the various options for cancer treatment.  I will plan to touch base with him after their meeting and will coordinate a plan.  Cindra Presume 11/02/2020, 4:57 PM

## 2020-11-02 NOTE — Progress Notes (Signed)
  Echocardiogram 2D Echocardiogram has been performed.  Teresa Sheppard 11/02/2020, 10:05 AM

## 2020-11-03 ENCOUNTER — Encounter: Payer: BC Managed Care – PPO | Admitting: Genetic Counselor

## 2020-11-03 ENCOUNTER — Encounter: Payer: Self-pay | Admitting: *Deleted

## 2020-11-03 ENCOUNTER — Telehealth: Payer: Self-pay | Admitting: Genetic Counselor

## 2020-11-03 ENCOUNTER — Telehealth: Payer: Self-pay | Admitting: *Deleted

## 2020-11-03 ENCOUNTER — Telehealth: Payer: Self-pay | Admitting: Hematology and Oncology

## 2020-11-03 ENCOUNTER — Other Ambulatory Visit: Payer: Self-pay | Admitting: Genetic Counselor

## 2020-11-03 DIAGNOSIS — Z803 Family history of malignant neoplasm of breast: Secondary | ICD-10-CM

## 2020-11-03 DIAGNOSIS — C773 Secondary and unspecified malignant neoplasm of axilla and upper limb lymph nodes: Secondary | ICD-10-CM | POA: Diagnosis not present

## 2020-11-03 DIAGNOSIS — Z170 Estrogen receptor positive status [ER+]: Secondary | ICD-10-CM

## 2020-11-03 DIAGNOSIS — C50911 Malignant neoplasm of unspecified site of right female breast: Secondary | ICD-10-CM | POA: Diagnosis not present

## 2020-11-03 DIAGNOSIS — C50811 Malignant neoplasm of overlapping sites of right female breast: Secondary | ICD-10-CM

## 2020-11-03 NOTE — Progress Notes (Signed)
Patient Care Team: Wenda Low, MD as PCP - General (Internal Medicine) Rockwell Germany, RN as Oncology Nurse Navigator Mauro Kaufmann, RN as Oncology Nurse Navigator [name], MD as Consulting Physician (General Surgery) Nicholas Lose, MD as Consulting Physician (Hematology and Oncology) Kyung Rudd, MD as Consulting Physician (Radiation Oncology)  DIAGNOSIS:    ICD-10-CM   1. Malignant neoplasm of overlapping sites of right breast in female, estrogen receptor positive ([place])  C50.811    Z17.0     SUMMARY OF ONCOLOGIC HISTORY: Oncology History  Malignant neoplasm of overlapping sites of right breast in female, estrogen receptor positive ([place])  06/21/2020 Initial Diagnosis   Patient palpated a right breast mass x 1 wk. Mammogram showed multiple confluent masses in the right breast at the 7 o'clock position measuring 2.3cm and at the 9 o'clock position measuring 2.2cm with one right axillary lymph node with cortical thickening. Biopsy showed invasive mammary carcinoma at both positions and in the axilla, grade 2-3, HER-2 equivocal by IHC (2+), positive by FISH (ratio 3.34), ER+ >95%, PR+ 25%, Ki67 60%.   06/29/2020 Cancer Staging   Staging form: Breast, AJCC 8th Edition - Clinical stage from 06/29/2020: Stage IB (cT2, cN1(f), cM0, G3, ER+, PR+, HER2+) - Signed by Nicholas Lose, MD on 06/29/2020   07/13/2020 -  Chemotherapy    Patient is on Treatment Plan: BREAST  DOCETAXEL + CARBOPLATIN + TRASTUZUMAB + PERTUZUMAB  (TCHP) Q21D       10/28/2020 Breast MRI   Complete imaging response     CHIEF COMPLIANT: Follow-up to discuss treatment  INTERVAL HISTORY: Teresa Sheppard is a 60 y.o. with above-mentioned history of right breast cancerwho completed neoadjuvant chemotherapy with [place]. Echo on 11/03/20 showed an ejection fraction of 55-60%. She presents to the clinic todayto discuss treatment and surgery options. She had several questions related to her diagnosis and  treatment.  Therefore she decided to make an appointment today.  She is also here to recheck her labs so that she can go to [place] and understand her risks.    ALLERGIES:  is allergic to shellfish-derived products and penicillins.  MEDICATIONS:  Current Outpatient Medications  Medication Sig Dispense Refill  . Acetaminophen (TYLENOL) 325 MG CAPS Take 162.5-325 mg by mouth every 6 (six) hours as needed (headaches / pain).    . cetirizine (ZYRTEC) 10 MG tablet Take 10 mg by mouth daily as needed for allergies.    . cholecalciferol (VITAMIN D3) 25 MCG (1000 UNIT) tablet Take 1,000 Units by mouth daily.    Marland Kitchen dexamethasone (DECADRON) 4 MG tablet Take 1 tablet (4 mg total) by mouth 2 (two) times daily. Take 1 tablet day before chemo and 1 tablet a day after chemo with food (Patient taking differently: Take 4 mg by mouth See admin instructions. Take 1 tablet day before chemo and 1 tablet a day after chemo with food) 12 tablet 0  . docetaxel (TAXOTERE) 20 MG/0.5ML injection Inject into the vein.    . hydrocortisone cream 1 % Cortisone (hydrocortisone) 1 % topical cream  APPLY A THIN LAYER TO THE AFFECTED AREA(S) BY TOPICAL ROUTE 2 TIMES PER DAY    . levofloxacin (LEVAQUIN) 250 MG tablet levofloxacin 250 mg tablet    . lidocaine-prilocaine (EMLA) cream Apply to affected area once (Patient taking differently: Apply 1 application topically daily as needed (port access).) 30 g 3  . LORazepam (ATIVAN) 0.5 MG tablet Take 1 tablet (0.5 mg total) by mouth at bedtime. As  needed 30 tablet 1  . Magnesium 300 MG CAPS     . nitrofurantoin, macrocrystal-monohydrate, (MACROBID) 100 MG capsule nitrofurantoin monohydrate/macrocrystals 100 mg capsule    . Omega-3 Fatty Acids (FISH OIL) 500 MG CAPS     . ondansetron (ZOFRAN) 8 MG tablet Take 1 tablet (8 mg total) by mouth 2 (two) times daily as needed (Nausea or vomiting). Start on the third day after chemotherapy. 30 tablet 1  . Polyethyl Glycol-Propyl Glycol (SYSTANE  OP) Place 1 drop into both eyes daily as needed (dry eyes).    . Probiotic CHEW     . prochlorperazine (COMPAZINE) 10 MG tablet Take 1 tablet (10 mg total) by mouth every 6 (six) hours as needed (Nausea or vomiting). 30 tablet 1   No current facility-administered medications for this visit.    PHYSICAL EXAMINATION: ECOG PERFORMANCE STATUS: 1 - Symptomatic but completely ambulatory  Vitals:   11/04/20 1037  BP: (!) 112/56  Pulse: 65  Resp: 16  Temp: (!) 97.3 F (36.3 C)  SpO2: 100%   Filed Weights   11/04/20 1037  Weight: 149 lb 11.2 oz (67.9 kg)    LABORATORY DATA:  I have reviewed the data as listed CMP Latest Ref Rng & Units 10/26/2020 10/15/2020 10/04/2020  Glucose 70 - 99 mg/dL 90 121(H) 96  BUN 6 - 20 mg/dL _0 Creatinine 0.44 - 1.00 mg/dL 0.72 0.73 0.67  Sodium 135 - 145 mmol/L 142 132(L) 143  Potassium 3.5 - 5.1 mmol/L 3.6 3.9 3.6  Chloride 98 - 111 mmol/L 104 99 108  CO2 22 - 32 mmol/L _1 Calcium 8.9 - 10.3 mg/dL 9.2 8.8(L) 8.9  Total Protein 6.5 - 8.1 g/dL 6.9 7.0 6.6  Total Bilirubin 0.3 - 1.2 mg/dL 0.3 0.2(L) 0.3  Alkaline Phos 38 - 126 U/L 109 140(H) 80  AST 15 - 41 U/L 33 51(H) 26  ALT 0 - 44 U/L 60(H) 46(H) 22    Lab Results  Component Value Date   WBC 1.2 (L) 11/04/2020   HGB 10.0 (L) 11/04/2020   HCT 30.8 (L) 11/04/2020   MCV 100.3 (H) 11/04/2020   PLT 180 11/04/2020   NEUTROABS PENDING 11/04/2020    ASSESSMENT & PLAN:  Malignant neoplasm of overlapping sites of right breast in female, estrogen receptor positive ([place]) 06/21/2020:Patient palpated a right breast mass x 1 wk. Mammogram showed multiple confluent masses in the right breast at the 7 o'clock position measuring 2.3cm and at the 9 o'clock position measuring 2.2cm with one right axillary lymph node with cortical thickening. Biopsy showed invasive mammary carcinoma at both positions and in the axilla, grade 2-3, HER-2 equivocal by IHC (2+), positive by FISH (ratio 3.34), ER+ >95%,  PR+ 25%, Ki67 60%.  Treatment plan: 1. Neoadjuvant chemotherapy with TCH Perjeta 6 cycles completed 10/26/2020 followed by HerceptinPerjeta versus Kadcylamaintenance for 1 year 2. Followed by breast conserving surgery if possible with sentinel lymph node study (she has not made up her mind regarding lumpectomy versus mastectomy versus bilateral mastectomies) 3. Followed by adjuvant radiation therapy if patient had lumpectomy 4.Followed by adjuvant antiestrogen therapy 5.Followed by neratinib ---------------------------------------------------------------------------------------------------------------------------------- Breast MRI 10/27/20: Complete imaging response Patient will discuss the possibility for lumpectomy with Dr. [name]. Lengthy discussion regarding her cancer diagnosis and prognosis and treatment options Patient has plans to attend graduation ceremony's [place].  Return to clinic for Herceptin and Perjeta maintenance. RTC 1 week after surgery    No orders of the defined  types were placed in this encounter.  The patient has a good understanding of the overall plan. she agrees with it. she will call with any problems that may develop before the next visit here.  Total time spent: 30 mins including face to face time and time spent for planning, charting and coordination of care  Teresa Eisenmenger, MD, MPH 11/04/2020  I, Molly Dorshimer, am acting as scribe for Dr. Nicholas Lose.  I have reviewed the above documentation for accuracy and completeness, and I agree with the above.

## 2020-11-03 NOTE — Telephone Encounter (Signed)
Genetics appt scheduled 5/13 at 11am.

## 2020-11-03 NOTE — Assessment & Plan Note (Signed)
06/21/2020:Patient palpated a right breast mass x 1 wk. Mammogram showed multiple confluent masses in the right breast at the 7 o'clock position measuring 2.3cm and at the 9 o'clock position measuring 2.2cm with one right axillary lymph node with cortical thickening. Biopsy showed invasive mammary carcinoma at both positions and in the axilla, grade 2-3, HER-2 equivocal by IHC (2+), positive by FISH (ratio 3.34), ER+ >95%, PR+ 25%, Ki67 60%.  Treatment plan: 1. Neoadjuvant chemotherapy with TCH Perjeta 6 cycles completed 10/26/2020 followed by HerceptinPerjeta versus Kadcylamaintenance for 1 year 2. Followed by breast conserving surgery if possible with sentinel lymph node study (she has not made up her mind regarding lumpectomy versus mastectomy versus bilateral mastectomies) 3. Followed by adjuvant radiation therapy if patient had lumpectomy 4.Followed by adjuvant antiestrogen therapy 5.Followed by neratinib ---------------------------------------------------------------------------------------------------------------------------------- Breast MRI 10/27/20: Complete imaging response Patient will discuss the possibility for lumpectomy with Dr. [name].  Patient has plans to attend graduation ceremony's [place].  Return to clinic for Herceptin and Perjeta maintenance.

## 2020-11-03 NOTE — Telephone Encounter (Signed)
Called in attempt to schedule urgent genetic counseling appt.  LVM with contact information requesting a call back to schedule appointment.

## 2020-11-03 NOTE — Telephone Encounter (Signed)
Received call from pt requesting visit with MD to discuss several questions regarding diagnosis and surgery options.  Pt states the questions she has will impact the type of surgery she chooses.  RN sent message to scheduling.

## 2020-11-03 NOTE — Telephone Encounter (Signed)
Scheduled appt per 5/12 sch msg. Pt aware.  

## 2020-11-04 ENCOUNTER — Encounter: Payer: Self-pay | Admitting: *Deleted

## 2020-11-04 ENCOUNTER — Telehealth: Payer: Self-pay

## 2020-11-04 ENCOUNTER — Inpatient Hospital Stay: Payer: BC Managed Care – PPO

## 2020-11-04 ENCOUNTER — Other Ambulatory Visit: Payer: Self-pay

## 2020-11-04 ENCOUNTER — Inpatient Hospital Stay (HOSPITAL_BASED_OUTPATIENT_CLINIC_OR_DEPARTMENT_OTHER): Payer: BC Managed Care – PPO | Admitting: Genetic Counselor

## 2020-11-04 ENCOUNTER — Inpatient Hospital Stay: Payer: BC Managed Care – PPO | Admitting: Hematology and Oncology

## 2020-11-04 DIAGNOSIS — Z800 Family history of malignant neoplasm of digestive organs: Secondary | ICD-10-CM

## 2020-11-04 DIAGNOSIS — D6481 Anemia due to antineoplastic chemotherapy: Secondary | ICD-10-CM | POA: Diagnosis not present

## 2020-11-04 DIAGNOSIS — C50811 Malignant neoplasm of overlapping sites of right female breast: Secondary | ICD-10-CM

## 2020-11-04 DIAGNOSIS — D701 Agranulocytosis secondary to cancer chemotherapy: Secondary | ICD-10-CM | POA: Diagnosis not present

## 2020-11-04 DIAGNOSIS — Z170 Estrogen receptor positive status [ER+]: Secondary | ICD-10-CM

## 2020-11-04 DIAGNOSIS — Z5111 Encounter for antineoplastic chemotherapy: Secondary | ICD-10-CM | POA: Diagnosis not present

## 2020-11-04 DIAGNOSIS — Z5112 Encounter for antineoplastic immunotherapy: Secondary | ICD-10-CM | POA: Diagnosis not present

## 2020-11-04 DIAGNOSIS — G620 Drug-induced polyneuropathy: Secondary | ICD-10-CM | POA: Diagnosis not present

## 2020-11-04 DIAGNOSIS — Z803 Family history of malignant neoplasm of breast: Secondary | ICD-10-CM

## 2020-11-04 DIAGNOSIS — C773 Secondary and unspecified malignant neoplasm of axilla and upper limb lymph nodes: Secondary | ICD-10-CM | POA: Diagnosis not present

## 2020-11-04 LAB — COMPREHENSIVE METABOLIC PANEL
ALT: 35 U/L (ref 0–44)
AST: 32 U/L (ref 15–41)
Albumin: 3.6 g/dL (ref 3.5–5.0)
Alkaline Phosphatase: 82 U/L (ref 38–126)
Anion gap: 6 (ref 5–15)
BUN: 13 mg/dL (ref 6–20)
CO2: 30 mmol/L (ref 22–32)
Calcium: 9.1 mg/dL (ref 8.9–10.3)
Chloride: 106 mmol/L (ref 98–111)
Creatinine, Ser: 0.7 mg/dL (ref 0.44–1.00)
GFR, Estimated: >60 mL/min (ref >60)
Glucose, Bld: 71 mg/dL (ref 70–99)
Potassium: 3.8 mmol/L (ref 3.5–5.1)
Sodium: 142 mmol/L (ref 135–145)
Total Bilirubin: <0.2 mg/dL — ABNORMAL LOW (ref 0.3–1.2)
Total Protein: 6.7 g/dL (ref 6.5–8.1)

## 2020-11-04 LAB — CBC WITH DIFFERENTIAL/PLATELET
Abs Immature Granulocytes: 0 10*3/uL (ref 0.00–0.07)
Basophils Absolute: 0 10*3/uL (ref 0.0–0.1)
Basophils Relative: 2 %
Eosinophils Absolute: 0 10*3/uL (ref 0.0–0.5)
Eosinophils Relative: 0 %
HCT: 30.8 % — ABNORMAL LOW (ref 36.0–46.0)
Hemoglobin: 10 g/dL — ABNORMAL LOW (ref 12.0–15.0)
Immature Granulocytes: 0 %
Lymphocytes Relative: 61 %
Lymphs Abs: 0.7 10*3/uL (ref 0.7–4.0)
MCH: 32.6 pg (ref 26.0–34.0)
MCHC: 32.5 g/dL (ref 30.0–36.0)
MCV: 100.3 fL — ABNORMAL HIGH (ref 80.0–100.0)
Monocytes Absolute: 0.2 10*3/uL (ref 0.1–1.0)
Monocytes Relative: 13 %
Neutro Abs: 0.3 10*3/uL — CL (ref 1.7–7.7)
Neutrophils Relative %: 24 %
Platelets: 180 10*3/uL (ref 150–400)
RBC: 3.07 MIL/uL — ABNORMAL LOW (ref 3.87–5.11)
RDW: 12.8 % (ref 11.5–15.5)
WBC: 1.2 10*3/uL — ABNORMAL LOW (ref 4.0–10.5)
nRBC: 0 % (ref 0.0–0.2)

## 2020-11-04 LAB — GENETIC SCREENING ORDER

## 2020-11-04 NOTE — Addendum Note (Signed)
Addended by: Harl Bowie on: 11/04/2020 11:04 AM   Modules accepted: Orders

## 2020-11-04 NOTE — Telephone Encounter (Signed)
CRITICAL VALUE STICKER  CRITICAL VALUE: Anc 0.3  RECEIVER (on-site recipient of call): Dream Nodal  DATE & TIME NOTIFIED: 11/04/20 11:02  MESSENGER (representative from lab): Verdis Frederickson   This nurse informed Dr. Geralyn Flash nurse of critical value at 11:10.

## 2020-11-04 NOTE — Progress Notes (Signed)
CRITICAL VALUE STICKER  CRITICAL VALUE: ANC 0.3  Geraldo Docker, RN  DATE & TIME NOTIFIED: 11/04/20 at 1100  MD NOTIFIED: Nicholas Lose, MD   RESPONSE: MD notified, verbalized understanding. No orders received at this time.

## 2020-11-05 ENCOUNTER — Other Ambulatory Visit: Payer: Self-pay | Admitting: General Surgery

## 2020-11-05 DIAGNOSIS — C50811 Malignant neoplasm of overlapping sites of right female breast: Secondary | ICD-10-CM

## 2020-11-05 DIAGNOSIS — Z170 Estrogen receptor positive status [ER+]: Secondary | ICD-10-CM

## 2020-11-05 DIAGNOSIS — N6320 Unspecified lump in the left breast, unspecified quadrant: Secondary | ICD-10-CM

## 2020-11-07 ENCOUNTER — Encounter: Payer: Self-pay | Admitting: Genetic Counselor

## 2020-11-07 ENCOUNTER — Telehealth: Payer: Self-pay | Admitting: Hematology and Oncology

## 2020-11-07 DIAGNOSIS — Z800 Family history of malignant neoplasm of digestive organs: Secondary | ICD-10-CM

## 2020-11-07 DIAGNOSIS — Z803 Family history of malignant neoplasm of breast: Secondary | ICD-10-CM

## 2020-11-07 HISTORY — DX: Family history of malignant neoplasm of digestive organs: Z80.0

## 2020-11-07 HISTORY — DX: Family history of malignant neoplasm of breast: Z80.3

## 2020-11-07 NOTE — Progress Notes (Signed)
REFERRING PROVIDER: Nicholas Lose, MD [address] [place],  [place] [address]  PRIMARY PROVIDER:  Wenda Low, MD  PRIMARY REASON FOR VISIT:  1. Malignant neoplasm of overlapping sites of right breast in female, estrogen receptor positive ([place])   2. Family history of breast cancer   3. Family history of pancreatic cancer     HISTORY OF PRESENT ILLNESS:   Teresa Sheppard, a 60 y.o. female, was seen for a [facility] cancer genetics consultation at the request of [name] due to a personal and family history of cancer.  Teresa Sheppard presents to clinic today with her husband to discuss the possibility of a hereditary predisposition to cancer, to discuss genetic testing, and to further clarify her future cancer risks, as well as potential cancer risks for family members.   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 The treatment plan included neoadjuvant chemotherapy with plans for surgery, adjuvant radiation as appropriate, anti-estrogens, and neratinib.   CANCER HISTORY:  Oncology History  Malignant neoplasm of overlapping sites of right breast in female, estrogen receptor positive ([place])  06/21/2020 Initial Diagnosis   Patient palpated a right breast mass x 1 wk. Mammogram showed multiple confluent masses in the right breast at the 7 o'clock position measuring 2.3cm and at the 9 o'clock position measuring 2.2cm with one right axillary lymph node with cortical thickening. Biopsy showed invasive mammary carcinoma at both positions and in the axilla, grade 2-3, HER-2 equivocal by IHC (2+), positive by FISH (ratio 3.34), ER+ >95%, PR+ 25%, Ki67 60%.   06/29/2020 Cancer Staging   Staging form: Breast, AJCC 8th Edition - Clinical stage from 06/29/2020: Stage IB (cT2, cN1(f), cM0, G3, ER+, PR+, HER2+) - Signed by Nicholas Lose, MD on 06/29/2020   07/13/2020 -  Chemotherapy    Patient is on Treatment  Plan: BREAST  DOCETAXEL + CARBOPLATIN + TRASTUZUMAB + PERTUZUMAB  (TCHP) Q21D       10/28/2020 Breast MRI   Complete imaging response     RISK FACTORS:  Menarche was at age 53.  First live birth at age 89.  OCP use for approximately 3 years.  Ovaries intact: yes.  Hysterectomy: no.  Menopausal status: postmenopausal.  HRT use: 0 years. Colonoscopy: yes; most recent approx 9 years ago. Mammogram within the last year: yes. Up to date with pelvic exams: yes. Any excessive radiation exposure in the past: no  Past Medical History:  Diagnosis Date  . Anxiety   . Breast cancer in female [facility])    Right  . Family history of breast cancer 11/07/2020  . Family history of pancreatic cancer 11/07/2020  . PONV (postoperative nausea and vomiting)   . Thyromegaly     Past Surgical History:  Procedure Laterality Date  . ABDOMINAL WALL DEFECT REPAIR     following hernia repair in 2010  . APPENDECTOMY    . BOWEL RESECTION     2008  while pregnant with last child  . CESAREAN SECTION    . [place] OF UTERUS     2000 and 2014  . HERNIA REPAIR    . PORTACATH PLACEMENT Left 07/12/2020   Procedure: INSERTION PORT-A-CATH WITH ULTRASOUND GUIDANCE;  Surgeon: [name], MD;  Location: [facility];  Service: General;  Laterality: Left;  . ROOT CANAL     2019  . URETERAL EXPLORATION     dilataion for congenitally small ureter  Social History   Socioeconomic History  . Marital status: Married    Spouse name: Not on file  . Number of children: Not on file  . Years of education: Not on file  . Highest education level: Not on file  Occupational History  . Not on file  Tobacco Use  . Smoking status: Never Smoker  . Smokeless tobacco: Never Used  Vaping Use  . Vaping Use: Never used  Substance and Sexual Activity  . Alcohol use: Yes    Comment: occas  . Drug use: Never  . Sexual activity: Not on file  Other Topics Concern  . Not on file  Social History Narrative  .  Not on file   Social Determinants of Health   Financial Resource Strain: Not on file  Food Insecurity: Not on file  Transportation Needs: Not on file  Physical Activity: Not on file  Stress: Not on file  Social Connections: Not on file     FAMILY HISTORY:  We obtained a detailed, 4-generation family history.  Significant diagnoses are listed below: Family History  Problem Relation Age of Onset  . Breast cancer Mother 48       metastatic  . Breast cancer Sister 66  . Breast cancer Maternal Aunt        dx 71s  . Pancreatic cancer Father 53  . Pancreatic cancer Paternal Grandfather        dx 60s  . Breast cancer Sister 18  . Leukemia Nephew 20  . Cancer Maternal Aunt        unknown type; ?bone; dx late 75s     Teresa Sheppard is uanware of previous family history of genetic testing for hereditary cancer risks besides that mentioned above. Patient's maternal ancestors are of [place] descent, and paternal ancestors are of [place], [place], [place], and [place] descent. There is no reported Ashkenazi Jewish ancestry. There is no known consanguinity.  GENETIC COUNSELING ASSESSMENT: Teresa Sheppard is a 60 y.o. female with a personal and family history of cancer which is somewhat suggestive of a hereditary cancer syndrome and predisposition to cancer given the presence of related cancers in both her maternal and paternal family. We, therefore, discussed and recommended the following at today's visit.   DISCUSSION: We discussed that 5 - 10% of cancer is hereditary, with most cases of hereditary breast cancer associated with mutations in BRCA1/2.  There are other genes that can be associated with hereditary breast cancer syndromes.  These include but are not limited to CHEK2, ATM, and PALB2.  There are also several genes associated with hereditary pancreatic cancer syndromes.  Type of cancer risk and level of risk are gene-specific. We discussed that testing is beneficial for several reasons including  knowing how to follow individuals after completing their treatment, identifying whether potential treatment options would be beneficial, and understanding if other family members could be at risk for cancer and allowing them to undergo genetic testing.   We reviewed the characteristics, features and inheritance patterns of hereditary cancer syndromes. We also discussed genetic testing, including the appropriate family members to test, the process of testing, insurance coverage and turn-around-time for results. We discussed the implications of a negative, positive and/or variant of uncertain significant result. In order to get genetic test results in a timely manner so that Teresa Sheppard can use these genetic test results for surgical decisions, we recommended Teresa Sheppard pursue genetic testing for the [facility] BRCAPlus Panel.  The BRCAplus panel offered by [facility] and  includes sequencing and deletion/duplication analysis for the following 8 genes: ATM, BRCA1, BRCA2, CDH1, CHEK2, PALB2, PTEN, and TP53. Once complete, we recommend Teresa Sheppard pursue reflex genetic testing to a more comprehensive gene panel.   Teresa Sheppard  was offered a common hereditary cancer panel (47 genes) and an expanded pan-cancer panel (77 genes). Teresa Sheppard was informed of the benefits and limitations of each panel, including that expanded pan-cancer panels contain genes that do not have clear management guidelines at this point in time.  We also discussed that as the number of genes included on a panel increases, the chances of variants of uncertain significance increases.  After considering the benefits and limitations of each gene panel, Teresa Sheppard  elected to have an expanded pan-cancer panel through [facility].  The CancerNext-Expanded gene panel offered by [facility] and includes sequencing, rearrangement, and RNA analysis for the following 77 genes: AIP, ALK, APC, ATM, AXIN2, BAP1, BARD1, BLM, BMPR1A, BRCA1, BRCA2, BRIP1,  CDC73, CDH1, CDK4, CDKN1B, CDKN2A, CHEK2, CTNNA1, DICER1, FANCC, FH, FLCN, GALNT12, KIF1B, LZTR1, MAX, MEN1, MET, MLH1, MSH2, MSH3, MSH6, MUTYH, NBN, NF1, NF2, NTHL1, PALB2, PHOX2B, PMS2, POT1, PRKAR1A, PTCH1, PTEN, RAD51C, RAD51D, RB1, RECQL, RET, SDHA, SDHAF2, SDHB, SDHC, SDHD, SMAD4, SMARCA4, SMARCB1, SMARCE1, STK11, SUFU, TMEM127, TP53, TSC1, TSC2, VHL and XRCC2 (sequencing and deletion/duplication); EGFR, EGLN1, HOXB13, KIT, MITF, PDGFRA, POLD1, and POLE (sequencing only); EPCAM and GREM1 (deletion/duplication only).   Based on Teresa Sheppard's personal and family history of cancer, she meets medical criteria for genetic testing. Despite that she meets criteria, she may still have an out of pocket cost. We discussed that if her out of pocket cost for testing is over $100, the laboratory should contact her to discuss self-pay prices, patient pay assistance programs, if applicable, and other billing options.   Teresa Sheppard has a family history of pancreatic cancer in one first degree relative (father) and one second degree relative (paternal grandfather). Per the International Cancer of the Pancreas Screening (CAPS) Consortium, individuals with a family history of pancreatic cancer in at least one first degree relative and one second degree relative should consider annual pancreatic cancer screening. We therefore recommended that Teresa Sheppard consider pancreatic cancer screening at an appropriate time. Teresa Sheppard may speak with her oncologists and/or GI specialists with whom she is currently established further about this recommendation.   PLAN: After considering the risks, benefits, and limitations, Teresa Sheppard provided informed consent to pursue genetic testing and the blood sample was sent to [facility] for analysis of the BRCAPlus and CancerNext-Expanded +RNAinsight Panel. Results should be available within approximately 1-2 weeks' time, at which point they will be disclosed by telephone to Ms.  Sheppard, as will any additional recommendations warranted by these results. Teresa Sheppard will receive a summary of her genetic counseling visit and a copy of her results once available. This information will also be available in Epic.   Lastly, we encouraged Teresa Sheppard to remain in contact with cancer genetics annually so that we can continuously update the family history and inform her of any changes in cancer genetics and testing that may be of benefit for this family.   Ms. Dacy questions were answered to her satisfaction today. Our contact information was provided should additional questions or concerns arise. Thank you for the referral and allowing [place] to share in the care of your patient.   Maveric Debono M. Joette Catching, [place], [facility] Genetic Counselor [email or web address] (P) [phone]  The patient was seen for a total of 40  minutes in face-to-face genetic counseling.  Drs. Magrinat, Lindi Adie and/or Burr Medico were available to discuss this case as needed.  _______________________________________________________________________ For Office Staff:  Number of people involved in session: 1 Was an Intern/ student involved with case: no

## 2020-11-07 NOTE — Telephone Encounter (Signed)
Scheduled appts per 5/12 sch msg. Called pt, no answer. Left msg with appts date and times.  

## 2020-11-08 ENCOUNTER — Inpatient Hospital Stay: Payer: BC Managed Care – PPO

## 2020-11-08 ENCOUNTER — Other Ambulatory Visit: Payer: Self-pay | Admitting: *Deleted

## 2020-11-08 ENCOUNTER — Telehealth: Payer: Self-pay | Admitting: *Deleted

## 2020-11-08 ENCOUNTER — Other Ambulatory Visit: Payer: Self-pay

## 2020-11-08 ENCOUNTER — Encounter: Payer: Self-pay | Admitting: Hematology and Oncology

## 2020-11-08 DIAGNOSIS — Z170 Estrogen receptor positive status [ER+]: Secondary | ICD-10-CM | POA: Diagnosis not present

## 2020-11-08 DIAGNOSIS — C50811 Malignant neoplasm of overlapping sites of right female breast: Secondary | ICD-10-CM | POA: Diagnosis not present

## 2020-11-08 DIAGNOSIS — D701 Agranulocytosis secondary to cancer chemotherapy: Secondary | ICD-10-CM | POA: Diagnosis not present

## 2020-11-08 DIAGNOSIS — D6481 Anemia due to antineoplastic chemotherapy: Secondary | ICD-10-CM | POA: Diagnosis not present

## 2020-11-08 DIAGNOSIS — Z5112 Encounter for antineoplastic immunotherapy: Secondary | ICD-10-CM | POA: Diagnosis not present

## 2020-11-08 DIAGNOSIS — Z5111 Encounter for antineoplastic chemotherapy: Secondary | ICD-10-CM | POA: Diagnosis not present

## 2020-11-08 DIAGNOSIS — C773 Secondary and unspecified malignant neoplasm of axilla and upper limb lymph nodes: Secondary | ICD-10-CM | POA: Diagnosis not present

## 2020-11-08 DIAGNOSIS — G620 Drug-induced polyneuropathy: Secondary | ICD-10-CM | POA: Diagnosis not present

## 2020-11-08 LAB — COMPREHENSIVE METABOLIC PANEL
ALT: 25 U/L (ref 0–44)
AST: 28 U/L (ref 15–41)
Albumin: 3.5 g/dL (ref 3.5–5.0)
Alkaline Phosphatase: 75 U/L (ref 38–126)
Anion gap: 8 (ref 5–15)
BUN: 13 mg/dL (ref 6–20)
CO2: 27 mmol/L (ref 22–32)
Calcium: 9 mg/dL (ref 8.9–10.3)
Chloride: 107 mmol/L (ref 98–111)
Creatinine, Ser: 0.71 mg/dL (ref 0.44–1.00)
GFR, Estimated: >60 mL/min (ref >60)
Glucose, Bld: 89 mg/dL (ref 70–99)
Potassium: 4.1 mmol/L (ref 3.5–5.1)
Sodium: 142 mmol/L (ref 135–145)
Total Bilirubin: 0.3 mg/dL (ref 0.3–1.2)
Total Protein: 6.7 g/dL (ref 6.5–8.1)

## 2020-11-08 LAB — CBC WITH DIFFERENTIAL/PLATELET
Abs Immature Granulocytes: 0 10*3/uL (ref 0.00–0.07)
Basophils Absolute: 0 10*3/uL (ref 0.0–0.1)
Basophils Relative: 2 %
Eosinophils Absolute: 0 10*3/uL (ref 0.0–0.5)
Eosinophils Relative: 1 %
HCT: 30.1 % — ABNORMAL LOW (ref 36.0–46.0)
Hemoglobin: 10 g/dL — ABNORMAL LOW (ref 12.0–15.0)
Immature Granulocytes: 0 %
Lymphocytes Relative: 59 %
Lymphs Abs: 0.9 10*3/uL (ref 0.7–4.0)
MCH: 32.8 pg (ref 26.0–34.0)
MCHC: 33.2 g/dL (ref 30.0–36.0)
MCV: 98.7 fL (ref 80.0–100.0)
Monocytes Absolute: 0.4 10*3/uL (ref 0.1–1.0)
Monocytes Relative: 23 %
Neutro Abs: 0.2 10*3/uL — CL (ref 1.7–7.7)
Neutrophils Relative %: 15 %
Platelets: 142 10*3/uL — ABNORMAL LOW (ref 150–400)
RBC: 3.05 MIL/uL — ABNORMAL LOW (ref 3.87–5.11)
RDW: 13.1 % (ref 11.5–15.5)
WBC: 1.6 10*3/uL — ABNORMAL LOW (ref 4.0–10.5)
nRBC: 0 % (ref 0.0–0.2)

## 2020-11-08 NOTE — Telephone Encounter (Signed)
Pt ANC 0.2.  RN reviewed with MD, MD verbalized understanding, no orders received at this time.  Pt called requesting advice regarding lab work and leaving to fly to [place] today for sons graduation.  Per MD okay for pt to fly to [place], pt needing to utilized standard precautions with wearing a face mask and washing hands frequently.  Pt verbalized understanding and appreciative of advice.

## 2020-11-09 ENCOUNTER — Other Ambulatory Visit: Payer: Self-pay | Admitting: General Surgery

## 2020-11-09 DIAGNOSIS — Z170 Estrogen receptor positive status [ER+]: Secondary | ICD-10-CM

## 2020-11-09 DIAGNOSIS — N6320 Unspecified lump in the left breast, unspecified quadrant: Secondary | ICD-10-CM

## 2020-11-09 DIAGNOSIS — C50811 Malignant neoplasm of overlapping sites of right female breast: Secondary | ICD-10-CM

## 2020-11-10 ENCOUNTER — Telehealth: Payer: Self-pay | Admitting: Hematology and Oncology

## 2020-11-10 NOTE — Telephone Encounter (Signed)
Scheduled appointment per 05/13 los. Patient will receive updated calender.

## 2020-11-14 ENCOUNTER — Telehealth: Payer: Self-pay | Admitting: Hematology and Oncology

## 2020-11-14 NOTE — Telephone Encounter (Signed)
R/s appt time per 5/23 sch msg. Pt aware.

## 2020-11-15 ENCOUNTER — Telehealth: Payer: Self-pay | Admitting: Genetic Counselor

## 2020-11-15 ENCOUNTER — Encounter: Payer: Self-pay | Admitting: Genetic Counselor

## 2020-11-15 DIAGNOSIS — Z1379 Encounter for other screening for genetic and chromosomal anomalies: Secondary | ICD-10-CM | POA: Insufficient documentation

## 2020-11-15 DIAGNOSIS — Z853 Personal history of malignant neoplasm of breast: Secondary | ICD-10-CM | POA: Diagnosis not present

## 2020-11-15 DIAGNOSIS — H25813 Combined forms of age-related cataract, bilateral: Secondary | ICD-10-CM | POA: Diagnosis not present

## 2020-11-15 DIAGNOSIS — H21341 Primary cyst of pars plana, right eye: Secondary | ICD-10-CM | POA: Diagnosis not present

## 2020-11-15 DIAGNOSIS — H43391 Other vitreous opacities, right eye: Secondary | ICD-10-CM | POA: Diagnosis not present

## 2020-11-15 NOTE — Telephone Encounter (Signed)
Revealed negative genetic testing of [facility] BRCAPlus Panel.  Discussed that we do not know why she has breast cancer or why there is cancer in the family. It could be familial, due to a different gene that we are not testing, or maybe our current technology may not be able to pick something up.  It will be important for her to keep in contact with genetics to keep up with whether additional testing may be needed.  Results of pan-cancer panel are pending.

## 2020-11-15 NOTE — Progress Notes (Signed)
Patient Care Team: Wenda Low, MD as PCP - General (Internal Medicine) Rockwell Germany, RN as Oncology Nurse Navigator Mauro Kaufmann, RN as Oncology Nurse Navigator [name], MD as Consulting Physician (General Surgery) Nicholas Lose, MD as Consulting Physician (Hematology and Oncology) Kyung Rudd, MD as Consulting Physician (Radiation Oncology)  DIAGNOSIS:    ICD-10-CM   1. Malignant neoplasm of overlapping sites of right breast in female, estrogen receptor positive ([place])  C50.811 Urinalysis, Complete w Microscopic   Z17.0 Urine Culture    [facility] PHYSICIAN COMMUNICATION 1    PHYSICIAN COMMUNICATION ORDER    SUMMARY OF ONCOLOGIC HISTORY: Oncology History  Malignant neoplasm of overlapping sites of right breast in female, estrogen receptor positive ([place])  06/21/2020 Initial Diagnosis   Patient palpated a right breast mass x 1 wk. Mammogram showed multiple confluent masses in the right breast at the 7 o'clock position measuring 2.3cm and at the 9 o'clock position measuring 2.2cm with one right axillary lymph node with cortical thickening. Biopsy showed invasive mammary carcinoma at both positions and in the axilla, grade 2-3, HER-2 equivocal by IHC (2+), positive by FISH (ratio 3.34), ER+ >95%, PR+ 25%, Ki67 60%.   06/29/2020 Cancer Staging   Staging form: Breast, AJCC 8th Edition - Clinical stage from 06/29/2020: Stage IB (cT2, cN1(f), cM0, G3, ER+, PR+, HER2+) - Signed by Nicholas Lose, MD on 06/29/2020   07/13/2020 - 10/26/2020 Chemotherapy      Patient is on Antibody Plan: BREAST TRASTUZUMAB + PERTUZUMAB Q21D    10/28/2020 Breast MRI   Complete imaging response   11/14/2020 Genetic Testing   Negative hereditary cancer genetic testing: no pathogenic variants detected in [place] BRCAPlus Panel.  The report date is Nov 14, 2020.  The BRCAplus panel offered by [employer] and includes sequencing and deletion/duplication analysis for the following 8 genes: ATM, BRCA1,  BRCA2, CDH1, CHEK2, PALB2, PTEN, and TP53.  Results of pan-cancer panel are pending.    11/16/2020 -  Chemotherapy      Patient is on Antibody Plan: BREAST TRASTUZUMAB + PERTUZUMAB Q21D      CHIEF COMPLIANT: Herceptin Perjeta maintenance  INTERVAL HISTORY: Teresa Sheppard is a 60 y.o. with above-mentioned history of right breast cancerwho completedneoadjuvant chemotherapy with [place] and is currently on Herceptin Perjeta maintenance. She presents to the clinic todayfor treatment.  She reports to be feeling reasonably better compared to couple weeks ago when her blood counts were low.  Does not have any fevers or chills.  She found out recently that her daughter had a breast abnormality and is undergoing work-up and she is very emotional about that today.  ALLERGIES:  is allergic to shellfish-derived products and penicillins.  MEDICATIONS:  Current Outpatient Medications  Medication Sig Dispense Refill  . acetaminophen (TYLENOL) 325 MG tablet Take 325 mg by mouth every 6 (six) hours as needed (headaches / pain).    . cholecalciferol (VITAMIN D3) 25 MCG (1000 UNIT) tablet Take 1,000 Units by mouth daily.    . Cranberry-Vitamin C (AZO CRANBERRY URINARY TRACT) 250-60 MG CAPS Take 1 tablet by mouth in the morning and at bedtime.    Marland Kitchen loratadine (CLARITIN) 10 MG tablet Take 10 mg by mouth daily.    . Multiple Vitamins-Minerals (EMERGEN-C IMMUNE PLUS PO) Take 1 each by mouth daily.    . Multiple Vitamins-Minerals (HAIR/SKIN/NAILS/BIOTIN PO) Take 1 capsule by mouth daily.    Vladimir Faster Glycol-Propyl Glycol (SYSTANE OP) Place 1 drop into both eyes daily as needed (  dry eyes).    . Probiotic Product (ALIGN) 4 MG CAPS Take 4 mg by mouth daily.    . prochlorperazine (COMPAZINE) 10 MG tablet Take 10 mg by mouth every 6 (six) hours as needed for nausea or vomiting.    . urea (CARMOL) 20 % lotion Apply 1 application topically as needed.     No current facility-administered medications for this  visit.   Facility-Administered Medications Ordered in Other Visits  Medication Dose Route Frequency Provider Last Rate Last Admin  . pertuzumab (PERJETA) 420 mg in sodium chloride 0.9 % 250 mL chemo infusion  420 mg Intravenous Once Nicholas Lose, MD        PHYSICAL EXAMINATION: ECOG PERFORMANCE STATUS: 1 - Symptomatic but completely ambulatory  Vitals:   11/16/20 0855  BP: 113/61  Pulse: 69  Resp: 18  Temp: (!) 97.3 F (36.3 C)  SpO2: 100%   Filed Weights   11/16/20 0855  Weight: 150 lb 6.4 oz (68.2 kg)     LABORATORY DATA:  I have reviewed the data as listed CMP Latest Ref Rng & Units 11/16/2020 11/08/2020 11/04/2020  Glucose 70 - 99 mg/dL 117(H) 89 71  BUN 6 - 20 mg/dL 13 13 13   Creatinine 0.44 - 1.00 mg/dL 0.71 0.71 0.70  Sodium 135 - 145 mmol/L 141 142 142  Potassium 3.5 - 5.1 mmol/L 4.2 4.1 3.8  Chloride 98 - 111 mmol/L 108 107 106  CO2 22 - 32 mmol/L 25 27 30   Calcium 8.9 - 10.3 mg/dL 9.1 9.0 9.1  Total Protein 6.5 - 8.1 g/dL 6.7 6.7 6.7  Total Bilirubin 0.3 - 1.2 mg/dL 0.3 0.3 <0.2(L)  Alkaline Phos 38 - 126 U/L 75 75 82  AST 15 - 41 U/L 30 28 32  ALT 0 - 44 U/L 21 25 35    Lab Results  Component Value Date   WBC 2.9 (L) 11/16/2020   HGB 10.8 (L) 11/16/2020   HCT 33.1 (L) 11/16/2020   MCV 98.5 11/16/2020   PLT 107 (L) 11/16/2020   NEUTROABS 1.2 (L) 11/16/2020    ASSESSMENT & PLAN:  Malignant neoplasm of overlapping sites of right breast in female, estrogen receptor positive ([place]) 06/21/2020:Patient palpated a right breast mass x 1 wk. Mammogram showed multiple confluent masses in the right breast at the 7 o'clock position measuring 2.3cm and at the 9 o'clock position measuring 2.2cm with one right axillary lymph node with cortical thickening. Biopsy showed invasive mammary carcinoma at both positions and in the axilla, grade 2-3, HER-2 equivocal by IHC (2+), positive by FISH (ratio 3.34), ER+ >95%, PR+ 25%, Ki67 60%.  Treatment plan: 1. Neoadjuvant  chemotherapy with [place] Perjeta 6 cyclescompleted 5/4/2022followed by HerceptinPerjeta versus Kadcylamaintenance for 1 year 2. Followed by breast conserving surgery if possible with sentinel lymph node study(she has not made up her mind regarding lumpectomy versus mastectomy versus bilateral mastectomies) 3. Followed by adjuvant radiation therapy if patient had lumpectomy 4.Followed by adjuvant antiestrogen therapy  5.Followed by neratinib ---------------------------------------------------------------------------------------------------------------------------------- Breast MRI 10/27/20: Complete imaging response Current treatment: Herceptin and Perjeta maintenance. 1. Chemotherapy-induced anemia: Improving and hemoglobin is now 10.8. 2. Neutropenia: She did not receive Neulasta with the last cycle of chemo.  Her ANC today is 1.2 3. Thrombocytopenia: Platelets are at 107 4. Bilateral lower extremity nails sore on the medial aspect.  She has symptoms of UTI: We obtained urine analysis: Negative for UTI  Monitoring toxicities closely. Her daughter is getting a work-up for a breast abnormality and she  is extremely anxious and worried about that.  Return to clinic after surgery to discuss the final pathology report.    Orders Placed This Encounter  Procedures  . Urine Culture    Standing Status:   Future    Number of Occurrences:   1    Standing Expiration Date:   11/16/2021  . Urinalysis, Complete w Microscopic    Standing Status:   Future    Number of Occurrences:   1    Standing Expiration Date:   11/16/2021  . ONCBCN PHYSICIAN COMMUNICATION 1    Trastuzumab/Pertuzumab SQ options for each treatment day available via "Add Orders".  . PHYSICIAN COMMUNICATION ORDER    A baseline Echo/Muga should be obtained prior to initiation of Trastuzumab, at 3, 6, 9 months during Trastuzumab treatment.   The patient has a good understanding of the overall plan. she agrees with it. she will  call with any problems that may develop before the next visit here.  Total time spent: 30 mins including face to face time and time spent for planning, charting and coordination of care  Rulon Eisenmenger, MD, MPH 11/16/2020  I, Molly Dorshimer, am acting as scribe for Dr. Nicholas Lose.  I have reviewed the above documentation for accuracy and completeness, and I agree with the above.

## 2020-11-16 ENCOUNTER — Inpatient Hospital Stay: Payer: BC Managed Care – PPO

## 2020-11-16 ENCOUNTER — Encounter: Payer: Self-pay | Admitting: *Deleted

## 2020-11-16 ENCOUNTER — Inpatient Hospital Stay (HOSPITAL_BASED_OUTPATIENT_CLINIC_OR_DEPARTMENT_OTHER): Payer: BC Managed Care – PPO | Admitting: Hematology and Oncology

## 2020-11-16 ENCOUNTER — Other Ambulatory Visit: Payer: Self-pay

## 2020-11-16 VITALS — BP 113/61 | HR 69 | Temp 97.3°F | Resp 18 | Ht 69.0 in | Wt 150.4 lb

## 2020-11-16 DIAGNOSIS — Z5111 Encounter for antineoplastic chemotherapy: Secondary | ICD-10-CM | POA: Diagnosis not present

## 2020-11-16 DIAGNOSIS — C50811 Malignant neoplasm of overlapping sites of right female breast: Secondary | ICD-10-CM

## 2020-11-16 DIAGNOSIS — G620 Drug-induced polyneuropathy: Secondary | ICD-10-CM | POA: Diagnosis not present

## 2020-11-16 DIAGNOSIS — D6481 Anemia due to antineoplastic chemotherapy: Secondary | ICD-10-CM | POA: Diagnosis not present

## 2020-11-16 DIAGNOSIS — D701 Agranulocytosis secondary to cancer chemotherapy: Secondary | ICD-10-CM | POA: Diagnosis not present

## 2020-11-16 DIAGNOSIS — C773 Secondary and unspecified malignant neoplasm of axilla and upper limb lymph nodes: Secondary | ICD-10-CM | POA: Diagnosis not present

## 2020-11-16 DIAGNOSIS — Z170 Estrogen receptor positive status [ER+]: Secondary | ICD-10-CM

## 2020-11-16 DIAGNOSIS — Z5112 Encounter for antineoplastic immunotherapy: Secondary | ICD-10-CM | POA: Diagnosis not present

## 2020-11-16 DIAGNOSIS — Z95828 Presence of other vascular implants and grafts: Secondary | ICD-10-CM

## 2020-11-16 LAB — COMPREHENSIVE METABOLIC PANEL
ALT: 21 U/L (ref 0–44)
AST: 30 U/L (ref 15–41)
Albumin: 3.6 g/dL (ref 3.5–5.0)
Alkaline Phosphatase: 75 U/L (ref 38–126)
Anion gap: 8 (ref 5–15)
BUN: 13 mg/dL (ref 6–20)
CO2: 25 mmol/L (ref 22–32)
Calcium: 9.1 mg/dL (ref 8.9–10.3)
Chloride: 108 mmol/L (ref 98–111)
Creatinine, Ser: 0.71 mg/dL (ref 0.44–1.00)
GFR, Estimated: >60 mL/min (ref >60)
Glucose, Bld: 117 mg/dL — ABNORMAL HIGH (ref 70–99)
Potassium: 4.2 mmol/L (ref 3.5–5.1)
Sodium: 141 mmol/L (ref 135–145)
Total Bilirubin: 0.3 mg/dL (ref 0.3–1.2)
Total Protein: 6.7 g/dL (ref 6.5–8.1)

## 2020-11-16 LAB — CBC WITH DIFFERENTIAL/PLATELET
Abs Immature Granulocytes: 0.01 10*3/uL (ref 0.00–0.07)
Basophils Absolute: 0 10*3/uL (ref 0.0–0.1)
Basophils Relative: 1 %
Eosinophils Absolute: 0.1 10*3/uL (ref 0.0–0.5)
Eosinophils Relative: 2 %
HCT: 33.1 % — ABNORMAL LOW (ref 36.0–46.0)
Hemoglobin: 10.8 g/dL — ABNORMAL LOW (ref 12.0–15.0)
Immature Granulocytes: 0 %
Lymphocytes Relative: 42 %
Lymphs Abs: 1.2 10*3/uL (ref 0.7–4.0)
MCH: 32.1 pg (ref 26.0–34.0)
MCHC: 32.6 g/dL (ref 30.0–36.0)
MCV: 98.5 fL (ref 80.0–100.0)
Monocytes Absolute: 0.4 10*3/uL (ref 0.1–1.0)
Monocytes Relative: 14 %
Neutro Abs: 1.2 10*3/uL — ABNORMAL LOW (ref 1.7–7.7)
Neutrophils Relative %: 41 %
Platelets: 107 10*3/uL — ABNORMAL LOW (ref 150–400)
RBC: 3.36 MIL/uL — ABNORMAL LOW (ref 3.87–5.11)
RDW: 12.7 % (ref 11.5–15.5)
WBC: 2.9 10*3/uL — ABNORMAL LOW (ref 4.0–10.5)
nRBC: 0 % (ref 0.0–0.2)

## 2020-11-16 LAB — URINALYSIS, COMPLETE (UACMP) WITH MICROSCOPIC
Bilirubin Urine: NEGATIVE
Glucose, UA: NEGATIVE mg/dL
Hgb urine dipstick: NEGATIVE
Ketones, ur: NEGATIVE mg/dL
Leukocytes,Ua: NEGATIVE
Nitrite: NEGATIVE
Protein, ur: NEGATIVE mg/dL
Specific Gravity, Urine: 1.003 — ABNORMAL LOW (ref 1.005–1.030)
pH: 7 (ref 5.0–8.0)

## 2020-11-16 MED ORDER — DIPHENHYDRAMINE HCL 25 MG PO CAPS
50.0000 mg | ORAL_CAPSULE | Freq: Once | ORAL | Status: AC
  Administered 2020-11-16: 50 mg via ORAL

## 2020-11-16 MED ORDER — TRASTUZUMAB-DKST CHEMO 150 MG IV SOLR
6.0000 mg/kg | Freq: Once | INTRAVENOUS | Status: AC
  Administered 2020-11-16: 399 mg via INTRAVENOUS
  Filled 2020-11-16: qty 19

## 2020-11-16 MED ORDER — ACETAMINOPHEN 325 MG PO TABS
650.0000 mg | ORAL_TABLET | Freq: Once | ORAL | Status: AC
  Administered 2020-11-16: 650 mg via ORAL

## 2020-11-16 MED ORDER — SODIUM CHLORIDE 0.9 % IV SOLN
420.0000 mg | Freq: Once | INTRAVENOUS | Status: AC
  Administered 2020-11-16: 420 mg via INTRAVENOUS
  Filled 2020-11-16: qty 14

## 2020-11-16 MED ORDER — DIPHENHYDRAMINE HCL 25 MG PO CAPS
ORAL_CAPSULE | ORAL | Status: AC
  Filled 2020-11-16: qty 2

## 2020-11-16 MED ORDER — SODIUM CHLORIDE 0.9% FLUSH
10.0000 mL | Freq: Once | INTRAVENOUS | Status: AC
  Administered 2020-11-16: 10 mL
  Filled 2020-11-16: qty 10

## 2020-11-16 MED ORDER — ACETAMINOPHEN 325 MG PO TABS
ORAL_TABLET | ORAL | Status: AC
  Filled 2020-11-16: qty 2

## 2020-11-16 NOTE — Assessment & Plan Note (Signed)
06/21/2020:Patient palpated a right breast mass x 1 wk. Mammogram showed multiple confluent masses in the right breast at the 7 o'clock position measuring 2.3cm and at the 9 o'clock position measuring 2.2cm with one right axillary lymph node with cortical thickening. Biopsy showed invasive mammary carcinoma at both positions and in the axilla, grade 2-3, HER-2 equivocal by IHC (2+), positive by FISH (ratio 3.34), ER+ >95%, PR+ 25%, Ki67 60%.  Treatment plan: 1. Neoadjuvant chemotherapy with [place] Perjeta 6 cyclescompleted 5/4/2022followed by HerceptinPerjeta versus Kadcylamaintenance for 1 year 2. Followed by breast conserving surgery if possible with sentinel lymph node study(she has not made up her mind regarding lumpectomy versus mastectomy versus bilateral mastectomies) 3. Followed by adjuvant radiation therapy if patient had lumpectomy 4.Followed by adjuvant antiestrogen therapy 5.Followed by neratinib ---------------------------------------------------------------------------------------------------------------------------------- Breast MRI 10/27/20: Complete imaging response Current treatment: Herceptin and Perjeta maintenance.  Return to clinic after surgery to discuss the final pathology report.

## 2020-11-16 NOTE — Patient Instructions (Signed)
[facility] ONCOLOGY  Discharge Instructions: Thank you for choosing [facility] to provide your oncology and hematology care.   If you have a lab appointment with the [facility], please go directly to the [facility] and check in at the registration area.   Wear comfortable clothing and clothing appropriate for easy access to any Portacath or PICC line.   We strive to give you quality time with your provider. You may need to reschedule your appointment if you arrive late (15 or more minutes).  Arriving late affects you and other patients whose appointments are after yours.  Also, if you miss three or more appointments without notifying the office, you may be dismissed from the clinic at the provider's discretion.      For prescription refill requests, have your pharmacy contact our office and allow 72 hours for refills to be completed.    Today you received the following chemotherapy and/or immunotherapy agents herceptin, Perjeta  To help prevent nausea and vomiting after your treatment, we encourage you to take your nausea medication as directed.  BELOW ARE SYMPTOMS THAT SHOULD BE REPORTED IMMEDIATELY: . *FEVER GREATER THAN 100.4 F (38 C) OR HIGHER . *CHILLS OR SWEATING . *NAUSEA AND VOMITING THAT IS NOT CONTROLLED WITH YOUR NAUSEA MEDICATION . *UNUSUAL SHORTNESS OF BREATH . *UNUSUAL BRUISING OR BLEEDING . *URINARY PROBLEMS (pain or burning when urinating, or frequent urination) . *BOWEL PROBLEMS (unusual diarrhea, constipation, pain near the anus) . TENDERNESS IN MOUTH AND THROAT WITH OR WITHOUT PRESENCE OF ULCERS (sore throat, sores in mouth, or a toothache) . UNUSUAL RASH, SWELLING OR PAIN  . UNUSUAL VAGINAL DISCHARGE OR ITCHING   Items with * indicate a potential emergency and should be followed up as soon as possible or go to the Emergency Department if any problems should occur.  Please show the CHEMOTHERAPY ALERT CARD or IMMUNOTHERAPY  ALERT CARD at check-in to the Emergency Department and triage nurse.  Should you have questions after your visit or need to cancel or reschedule your appointment, please contact [facility]  Dept: [phone]  and follow the prompts.  Office hours are 8:00 a.m. to 4:30 p.m. Monday - Friday. Please note that voicemails left after 4:00 p.m. may not be returned until the following business day.  We are closed weekends and major holidays. You have access to a nurse at all times for urgent questions. Please call the main number to the clinic Dept: [phone] and follow the prompts.   For any non-urgent questions, you may also contact your provider using MyChart. We now offer e-Visits for anyone 66 and older to request care online for non-urgent symptoms. For details visit [email or web address].   Also download the MyChart app! Go to the app store, search "MyChart", open the app, select [facility], and log in with your MyChart username and password.  Due to Covid, a mask is required upon entering the hospital/clinic. If you do not have a mask, one will be given to you upon arrival. For doctor visits, patients may have 1 support person aged 2 or older with them. For treatment visits, patients cannot have anyone with them due to current Covid guidelines and our immunocompromised population.

## 2020-11-17 DIAGNOSIS — R87610 Atypical squamous cells of undetermined significance on cytologic smear of cervix (ASC-US): Secondary | ICD-10-CM | POA: Diagnosis not present

## 2020-11-17 LAB — URINE CULTURE: Culture: 50000 — AB

## 2020-11-18 ENCOUNTER — Other Ambulatory Visit: Payer: Self-pay | Admitting: Hematology and Oncology

## 2020-11-18 ENCOUNTER — Other Ambulatory Visit: Payer: Self-pay | Admitting: General Surgery

## 2020-11-18 ENCOUNTER — Encounter: Payer: Self-pay | Admitting: Hematology and Oncology

## 2020-11-18 DIAGNOSIS — C50811 Malignant neoplasm of overlapping sites of right female breast: Secondary | ICD-10-CM

## 2020-11-18 NOTE — Progress Notes (Signed)
Prescription for mastectomy products successfully faxed to [facility].

## 2020-11-22 ENCOUNTER — Telehealth: Payer: Self-pay

## 2020-11-22 NOTE — Progress Notes (Signed)
Surgical Instructions    Your procedure is scheduled on Monday June 6th.  Report to [facility] Main Entrance "A" at 8:30 A.M., then check in with the Admitting office.  Call this number if you have problems the morning of surgery:  [phone]   If you have any questions prior to your surgery date call [phone]: Open Monday-Friday 8am-4pm   Remember:  Do not eat after midnight the night before your surgery  You may drink clear liquids until 7:30am the morning of your surgery.   Clear liquids allowed are: Water, Non-Citrus Juices (without pulp), Carbonated Beverages, Clear Tea, Black Coffee Only, and Gatorade  Please complete your PRE-SURGERY ENSURE that was provided to you by .7:30 AM.. the morning of surgery.  Please, if able, drink it in one setting. DO NOT SIP.      Take these medicines the morning of surgery with A SIP OF WATER  loratadine (CLARITIN) 10 MG tablet acetaminophen (TYLENOL) 325 MG tablet if needed prochlorperazine (COMPAZINE) 10 MG tablet if needed   As of today, STOP taking any Aspirin (unless otherwise instructed by your surgeon) Aleve, Naproxen, Ibuprofen, Motrin, Advil, Goody's, BC's, all herbal medications, fish oil, and all vitamins.                     Do not wear jewelry, make up, or nail polish DO Not wear nail polish, gel polish, artificial nails, or any other type of covering on natural nails including finger and toenails. If patients have artificial nails, gel coating, etc. that need to be removed by a nail saloon please have this removed prior to surgery or surgery may need to be canceled/delayed if the surgeon/ anesthesia feels like the patient is unable to be adequately monitored.            Do not wear lotions, powders, perfumes, or deodorant.            Do not shave 48 hours prior to surgery.              Do not bring valuables to the hospital.            [facility] is not responsible for any belongings or valuables.  Do NOT Smoke  (Tobacco/Vaping) or drink Alcohol 24 hours prior to your procedure If you use a CPAP at night, you may bring all equipment for your overnight stay.   Contacts, glasses, dentures or bridgework may not be worn into surgery, please bring cases for these belongings   For patients admitted to the hospital, discharge time will be determined by your treatment team.   Patients discharged the day of surgery will not be allowed to drive home, and someone needs to stay with them for 24 hours.    Special instructions:    Oral Hygiene is also important to reduce your risk of infection.  Remember - BRUSH YOUR TEETH THE MORNING OF SURGERY WITH YOUR REGULAR TOOTHPASTE   [facility]- Preparing For Surgery  Before surgery, you can play an important role. Because skin is not sterile, your skin needs to be as free of germs as possible. You can reduce the number of germs on your skin by washing with CHG (chlorahexidine gluconate) Soap before surgery.  CHG is an antiseptic cleaner which kills germs and bonds with the skin to continue killing germs even after washing.     Please do not use if you have an allergy to CHG or antibacterial soaps. If your skin becomes  reddened/irritated stop using the CHG.  Do not shave (including legs and underarms) for at least 48 hours prior to first CHG shower. It is OK to shave your face.  Please follow these instructions carefully.    1.  Shower the NIGHT BEFORE SURGERY and the MORNING OF SURGERY with CHG Soap.   If you chose to wash your hair, wash your hair first as usual with your normal shampoo. After you shampoo, rinse your hair and body thoroughly to remove the shampoo.  Then [employer] and genitals (private parts) with your normal soap and rinse thoroughly to remove soap.  2. After that Use CHG Soap as you would any other liquid soap. You can apply CHG directly to the skin and wash gently with a scrungie or a clean washcloth.   3. Apply the CHG Soap to your body ONLY  FROM THE NECK DOWN.  Do not use on open wounds or open sores. Avoid contact with your eyes, ears, mouth and genitals (private parts). Wash Face and genitals (private parts)  with your normal soap.   4. Wash thoroughly, paying special attention to the area where your surgery will be performed.  5. Thoroughly rinse your body with warm water from the neck down.  6. DO NOT shower/wash with your normal soap after using and rinsing off the CHG Soap.  7. Pat yourself dry with a CLEAN TOWEL.  8. Wear CLEAN PAJAMAS to bed the night before surgery  9. Place CLEAN SHEETS on your bed the night before your surgery  10. DO NOT SLEEP WITH PETS.   Day of Surgery:  Take a shower with CHG soap. Wear Clean/Comfortable clothing the morning of surgery Do not apply any deodorants/lotions.   Remember to brush your teeth WITH YOUR REGULAR TOOTHPASTE.   Please read over the following fact sheets that you were given.

## 2020-11-22 NOTE — Telephone Encounter (Signed)
Pt called and LVM regarding advice as she has had a COVID vaccine and is scheduled for MM this week followed by surgery 11/28/20. Pt states concern for MM as [facility] informed her she would need to r/s scan if she has had COVID vaccine. This LPN attempted to call pt, no answer, LVM and deferred this to Bary Castilla, RN

## 2020-11-23 ENCOUNTER — Encounter (HOSPITAL_COMMUNITY)
Admission: RE | Admit: 2020-11-23 | Discharge: 2020-11-23 | Disposition: A | Discharge status: Home / Self Care | Payer: BC Managed Care – PPO | Source: Ambulatory Visit | Attending: General Surgery | Admitting: General Surgery

## 2020-11-23 ENCOUNTER — Other Ambulatory Visit: Payer: Self-pay | Admitting: *Deleted

## 2020-11-23 ENCOUNTER — Encounter (HOSPITAL_COMMUNITY): Payer: Self-pay

## 2020-11-23 ENCOUNTER — Telehealth: Payer: Self-pay | Admitting: *Deleted

## 2020-11-23 ENCOUNTER — Other Ambulatory Visit: Payer: Self-pay

## 2020-11-23 HISTORY — DX: Personal history of urinary calculi: Z87.442

## 2020-11-23 HISTORY — DX: Thyrotoxicosis, unspecified without thyrotoxic crisis or storm: E05.90

## 2020-11-23 MED ORDER — AZITHROMYCIN 250 MG PO TABS: ORAL_TABLET | ORAL | 0 refills | Status: DC

## 2020-11-23 NOTE — Telephone Encounter (Signed)
Received call from pt with complaint of chills, fever of 100.8, generalized body aches, and sore throat x1 day.  Per MD pt needing to take 650 mg p.o tylenol to alleviate fever and take home Covid test and call our office with the results.  Pt verbalized understanding.

## 2020-11-23 NOTE — Progress Notes (Addendum)
PCP - Dr. Wenda Low  Chest x-ray - 10/15/20 EKG - 10/15/20 Stress Test - "Long time ago maybe 10 years" no follow up needed ECHO - 11/02/20 Cardiac Cath - Denies  Sleep Study - Denies  DM - Denies  ERAS Protcol -Yes PRE-SURGERY Ensure    COVID TEST- 11/24/20  Anesthesia review: Yes Fever on morning of 11/23/20  Patient denies shortness of breath and chest pain at PAT appointment  Patient stated woke this morning with a fever of 100.8 up to 101. Patient felt she had sinus infection. Patient spoke to oncologist as well Dr. Melissa Montane office.  Advised to take home Covid test which resulted negative per patient. Has appointment tomorrow 11/24/20 to test again for covid. [name] oncologist prescribed Azithromycin.   All instructions explained to the patient, with a verbal understanding of the material. Patient agrees to go over the instructions while at home for a better understanding.  The opportunity to ask questions was provided.  Called [facility] and spoke to [facility] 07/27/22 @ 1146 then Alicia [phone].  Made Elmo Putt aware of patient's symptoms of fever and upper respiratory cough and raspy voice.  Expressed concern of this respiratory issue in regards to anesthesia and upcoming procedures. Dr. [name] wants the COVID test to be done on Thursday and will then determine what course to take.  Elmo Putt will call either Rolla Flatten or Lindsi Forte to keep [place] informed.  I expressed to Elmo Putt that even if Dr. [name] feels surgery can move forward, that anesthesia may not be comfortable if the patient is still exhibiting symptoms.

## 2020-11-23 NOTE — Progress Notes (Signed)
Pt called stating home Covid test is negative.  Pt currently scheduled for per surgery Covid PCR tomorrow.  Per Md pt needing to start Z-pak for tx of sinus infection and still undergo Covid PCR tomorrow. Prescription sent to pharmacy on file, pt educated and verbalized understanding.

## 2020-11-24 ENCOUNTER — Other Ambulatory Visit (HOSPITAL_COMMUNITY)
Admission: RE | Admit: 2020-11-24 | Discharge: 2020-11-24 | Disposition: A | Discharge status: Home / Self Care | Payer: BC Managed Care – PPO | Source: Ambulatory Visit | Attending: General Surgery | Admitting: General Surgery

## 2020-11-24 DIAGNOSIS — Z20822 Contact with and (suspected) exposure to covid-19: Secondary | ICD-10-CM | POA: Insufficient documentation

## 2020-11-24 DIAGNOSIS — Z01812 Encounter for preprocedural laboratory examination: Secondary | ICD-10-CM | POA: Insufficient documentation

## 2020-11-24 LAB — SARS CORONAVIRUS 2 (TAT 6-24 HRS): SARS Coronavirus 2: NEGATIVE

## 2020-11-25 ENCOUNTER — Other Ambulatory Visit: Payer: Self-pay

## 2020-11-25 ENCOUNTER — Ambulatory Visit
Admission: RE | Admit: 2020-11-25 | Discharge: 2020-11-25 | Disposition: A | Discharge status: Home / Self Care | Payer: BC Managed Care – PPO | Source: Ambulatory Visit | Attending: General Surgery | Admitting: General Surgery

## 2020-11-25 ENCOUNTER — Telehealth: Payer: Self-pay

## 2020-11-25 DIAGNOSIS — Z170 Estrogen receptor positive status [ER+]: Secondary | ICD-10-CM

## 2020-11-25 DIAGNOSIS — R928 Other abnormal and inconclusive findings on diagnostic imaging of breast: Secondary | ICD-10-CM | POA: Diagnosis not present

## 2020-11-25 DIAGNOSIS — C50811 Malignant neoplasm of overlapping sites of right female breast: Secondary | ICD-10-CM

## 2020-11-25 DIAGNOSIS — C773 Secondary and unspecified malignant neoplasm of axilla and upper limb lymph nodes: Secondary | ICD-10-CM | POA: Diagnosis not present

## 2020-11-25 DIAGNOSIS — C50911 Malignant neoplasm of unspecified site of right female breast: Secondary | ICD-10-CM | POA: Diagnosis not present

## 2020-11-25 DIAGNOSIS — N6320 Unspecified lump in the left breast, unspecified quadrant: Secondary | ICD-10-CM

## 2020-11-25 NOTE — Telephone Encounter (Signed)
This LPN spoke with pt regarding questions via mychart. Pt advised to take Mucinex GM cough expectorant. Pt verbalized thanks and understanding. Pt also understands she will still have seed placement and needs to finish her abx. Pt understands to call with any further questions/concerns.

## 2020-11-25 NOTE — Progress Notes (Signed)
Anesthesia Chart Review:   Case: [id number] Date/Time: 11/28/20 1015   Procedures:      RIGHT MASTECTOMY WITH RIGHT AXILLARY SENTINEL LYMPH NODE BIOPSY (Right Breast)     RADIOACTIVE SEED GUIDED RIGHT AXILLARY AXILLARY SENTINEL LYMPH NODE EXCISION (Right Breast)     RADIOACTIVE SEED GUIDED LEFT EXCISIONAL BREAST BIOPSY (Left Breast)   Anesthesia type: General   Pre-op diagnosis: BREAST CANCER   [facility] 09 / [facility]   Surgeons: [name], MD      DISCUSSION: Pt is 60 years old with hx breast cancer  At pre-admission testing on 11/23/20, pt reported fever 101, sinus congestion that morning. Pt called oncologist who prescribed her a z-pak. COVID test negative. I called pt this morning 6/3 to see how see is feeling. She reports 2 doses of zithromax in, she is feeling better, no fever or antipyretic in 18 hours. Denies nasal congestion but does report significant post-nasal drip. I explained there is an increased risk of respiratory complications with anesthesia if she has active URI symptoms day of surgery. I encouraged her to discuss with Dr. [name] if her surgery is urgent and shouldn't be delayed despite her illness/symptoms.    VS: BP 119/72   Pulse 98   Temp 37.4 C (Oral)   Resp 18   Ht 5' 9.5" (1.765 m)   Wt 67.2 kg   SpO2 100%   BMI 21.56 kg/m   PROVIDERS: - PCP is Wenda Low, MD - Oncologist is Nicholas Lose, MD   LABS: - CBC w/diff 11/16/20: WBC 2.9, hgb/hct 10.8/33.1, platelets 107. Abs neutrophils 1.2 - CMP 11/16/20: Glucose 117   IMAGES: 1 view CXR 10/15/20: No active disease.   EKG 10/15/20: Sinus rhythm. Consider left atrial enlargement. RSR' in V1 or V2, right VCD or RVH   CV: Echo 11/02/20:  1. Left ventricular ejection fraction, by estimation, is 55 to 60%. The left ventricle has normal function. The left ventricle has no regional wall motion abnormalities. Left ventricular diastolic parameters were normal.  2. Right ventricular systolic  function is normal. The right ventricular size is normal. There is normal pulmonary artery systolic pressure.  3. The mitral valve is normal in structure. No evidence of mitral valve regurgitation. No evidence of mitral stenosis.  4. The aortic valve is normal in structure. Aortic valve regurgitation is not visualized. No aortic stenosis is present.  5. The inferior vena cava is normal in size with greater than 50% respiratory variability, suggesting right atrial pressure of 3 mmHg.  - Comparison(s): No significant change from prior study. Prior images reviewed side by side. LV strain was not performed on this study.    Past Medical History:  Diagnosis Date  . Anxiety   . Breast cancer in female [facility])    Right  . Family history of breast cancer 11/07/2020  . Family history of pancreatic cancer 11/07/2020  . History of kidney stones 2014-2015  . Hyperthyroidism    No longer an issue  . PONV (postoperative nausea and vomiting)   . Thyromegaly     Past Surgical History:  Procedure Laterality Date  . ABDOMINAL WALL DEFECT REPAIR     following hernia repair in 2010  . APPENDECTOMY    . BOWEL RESECTION     2008  while pregnant with last child. Scar tissue from appendectomy wrapped around bowel and was snipped to free bowel.   . CESAREAN SECTION    . DILATION AND CURETTAGE OF UTERUS  2000 and 2014  . HERNIA REPAIR    . PORTACATH PLACEMENT Left 07/12/2020   Procedure: INSERTION PORT-A-CATH WITH ULTRASOUND GUIDANCE;  Surgeon: [name], MD;  Location: [place];  Service: General;  Laterality: Left;  . ROOT CANAL     2019  . TONSILLECTOMY  1969  . URETERAL EXPLORATION     dilataion for congenitally small ureter    MEDICATIONS: . acetaminophen (TYLENOL) 325 MG tablet  . azithromycin (ZITHROMAX) 250 MG tablet  . cholecalciferol (VITAMIN D3) 25 MCG (1000 UNIT) tablet  . Cranberry-Vitamin C (AZO CRANBERRY URINARY TRACT) 250-60 MG CAPS  . loratadine (CLARITIN) 10 MG tablet  .  Multiple Vitamins-Minerals (EMERGEN-C IMMUNE PLUS PO)  . Multiple Vitamins-Minerals (HAIR/SKIN/NAILS/BIOTIN PO)  . Polyethyl Glycol-Propyl Glycol (SYSTANE OP)  . Probiotic Product (ALIGN) 4 MG CAPS  . prochlorperazine (COMPAZINE) 10 MG tablet  . urea (CARMOL) 20 % lotion   No current facility-administered medications for this encounter.    Pt to discuss proceeding with surgery at this time prior to getting her seeds placed today. If decision made to proceed, will need further evaluation by assigned anesthesiologist day of surgery.   Willeen Cass, PhD, FNP-BC [facility]/Anesthesiology Phone: [phone] 11/25/2020 9:19 AM

## 2020-11-25 NOTE — Progress Notes (Signed)
Anesthesiologist, Dr. Royce Macadamia, aware that patient has had a fever with congestion and cough but has had 2 negative covid test.  Per Dr. Cristal Generous office, patient is feeling better after 3 days of zpak.  Patient denies fever at this time.  Per Dr. Royce Macadamia, patient can continue with scheduled seed placement today and surgery on Monday.  Dr. Cristal Generous office is aware.

## 2020-11-25 NOTE — Anesthesia Preprocedure Evaluation (Addendum)
Anesthesia Evaluation  Patient identified by MRN, date of birth, ID band Patient awake    Reviewed: Allergy & Precautions, NPO status , Patient's Chart, lab work & pertinent test results  History of Anesthesia Complications (+) PONVNegative for: history of anesthetic complications  Airway Mallampati: III  TM Distance: >3 FB Neck ROM: Full    Dental  (+) Dental Advisory Given   Pulmonary neg pulmonary ROS,    Pulmonary exam normal        Cardiovascular negative cardio ROS Normal cardiovascular exam     Neuro/Psych negative neurological ROS     GI/Hepatic negative GI ROS, Neg liver ROS,   Endo/Other  Hyperthyroidism   Renal/GU negative Renal ROS  negative genitourinary   Musculoskeletal negative musculoskeletal ROS (+)   Abdominal   Peds  Hematology  (+) anemia ,   Anesthesia Other Findings  Breast cancer  Reproductive/Obstetrics                             Anesthesia Physical Anesthesia Plan  ASA: II  Anesthesia Plan: General   Post-op Pain Management: GA combined w/ Regional for post-op pain   Induction: Intravenous  PONV Risk Score and Plan: 4 or greater and Ondansetron, Dexamethasone, Midazolam and Treatment may vary due to age or medical condition  Airway Management Planned: LMA  Additional Equipment: None  Intra-op Plan:   Post-operative Plan: Extubation in OR  Informed Consent: I have reviewed the patients History and Physical, chart, labs and discussed the procedure including the risks, benefits and alternatives for the proposed anesthesia with the patient or authorized representative who has indicated his/her understanding and acceptance.     Dental advisory given  Plan Discussed with:   Anesthesia Plan Comments: (See APP note by Durel Salts, FNP )       Anesthesia Quick Evaluation

## 2020-11-28 ENCOUNTER — Observation Stay (HOSPITAL_COMMUNITY)
Admission: RE | Admit: 2020-11-28 | Discharge: 2020-11-29 | Disposition: A | Discharge status: Home / Self Care | Payer: BC Managed Care – PPO | Attending: General Surgery | Admitting: General Surgery

## 2020-11-28 ENCOUNTER — Ambulatory Visit (HOSPITAL_COMMUNITY)
Admission: RE | Admit: 2020-11-28 | Discharge: 2020-11-28 | Disposition: A | Discharge status: Home / Self Care | Payer: BC Managed Care – PPO | Source: Ambulatory Visit | Attending: General Surgery | Admitting: General Surgery

## 2020-11-28 ENCOUNTER — Ambulatory Visit (HOSPITAL_COMMUNITY): Payer: BC Managed Care – PPO | Admitting: Emergency Medicine

## 2020-11-28 ENCOUNTER — Ambulatory Visit
Admission: RE | Admit: 2020-11-28 | Discharge: 2020-11-28 | Disposition: A | Discharge status: Home / Self Care | Payer: BC Managed Care – PPO | Source: Ambulatory Visit | Attending: General Surgery | Admitting: General Surgery

## 2020-11-28 ENCOUNTER — Encounter (HOSPITAL_COMMUNITY): Payer: Self-pay | Admitting: General Surgery

## 2020-11-28 ENCOUNTER — Encounter (HOSPITAL_COMMUNITY)
Admission: RE | Disposition: A | Discharge status: Home / Self Care | Payer: Self-pay | Source: Home / Self Care | Attending: General Surgery

## 2020-11-28 ENCOUNTER — Ambulatory Visit (HOSPITAL_COMMUNITY): Payer: BC Managed Care – PPO | Admitting: Anesthesiology

## 2020-11-28 DIAGNOSIS — D630 Anemia in neoplastic disease: Secondary | ICD-10-CM | POA: Diagnosis not present

## 2020-11-28 DIAGNOSIS — Z803 Family history of malignant neoplasm of breast: Secondary | ICD-10-CM | POA: Insufficient documentation

## 2020-11-28 DIAGNOSIS — C50911 Malignant neoplasm of unspecified site of right female breast: Principal | ICD-10-CM | POA: Insufficient documentation

## 2020-11-28 DIAGNOSIS — N6082 Other benign mammary dysplasias of left breast: Secondary | ICD-10-CM | POA: Diagnosis not present

## 2020-11-28 DIAGNOSIS — Z170 Estrogen receptor positive status [ER+]: Secondary | ICD-10-CM

## 2020-11-28 DIAGNOSIS — C50811 Malignant neoplasm of overlapping sites of right female breast: Secondary | ICD-10-CM

## 2020-11-28 DIAGNOSIS — N6012 Diffuse cystic mastopathy of left breast: Secondary | ICD-10-CM | POA: Diagnosis not present

## 2020-11-28 DIAGNOSIS — G8918 Other acute postprocedural pain: Secondary | ICD-10-CM | POA: Diagnosis not present

## 2020-11-28 DIAGNOSIS — R928 Other abnormal and inconclusive findings on diagnostic imaging of breast: Secondary | ICD-10-CM | POA: Diagnosis not present

## 2020-11-28 DIAGNOSIS — Z9012 Acquired absence of left breast and nipple: Secondary | ICD-10-CM

## 2020-11-28 DIAGNOSIS — N6011 Diffuse cystic mastopathy of right breast: Secondary | ICD-10-CM | POA: Diagnosis not present

## 2020-11-28 DIAGNOSIS — C50912 Malignant neoplasm of unspecified site of left female breast: Secondary | ICD-10-CM | POA: Diagnosis not present

## 2020-11-28 DIAGNOSIS — C773 Secondary and unspecified malignant neoplasm of axilla and upper limb lymph nodes: Secondary | ICD-10-CM | POA: Diagnosis not present

## 2020-11-28 DIAGNOSIS — N6320 Unspecified lump in the left breast, unspecified quadrant: Secondary | ICD-10-CM

## 2020-11-28 HISTORY — PX: MASTECTOMY W/ SENTINEL NODE BIOPSY: SHX2001

## 2020-11-28 HISTORY — PX: RADIOACTIVE SEED GUIDED AXILLARY SENTINEL LYMPH NODE: SHX6735

## 2020-11-28 HISTORY — PX: RADIOACTIVE SEED GUIDED EXCISIONAL BREAST BIOPSY: SHX6490

## 2020-11-28 SURGERY — MASTECTOMY WITH SENTINEL LYMPH NODE BIOPSY
Anesthesia: General | Site: Breast | Laterality: Right

## 2020-11-28 MED ORDER — VANCOMYCIN HCL IN DEXTROSE 1-5 GM/200ML-% IV SOLN
1000.0000 mg | INTRAVENOUS | Status: DC
  Filled 2020-11-28: qty 200

## 2020-11-28 MED ORDER — ROPIVACAINE HCL 5 MG/ML IJ SOLN
INTRAMUSCULAR | Status: DC | PRN
  Administered 2020-11-28: 30 mL via PERINEURAL

## 2020-11-28 MED ORDER — ACETAMINOPHEN 500 MG PO TABS
1000.0000 mg | ORAL_TABLET | ORAL | Status: DC
  Filled 2020-11-28: qty 2

## 2020-11-28 MED ORDER — MIDAZOLAM HCL 2 MG/2ML IJ SOLN
INTRAMUSCULAR | Status: AC
  Filled 2020-11-28: qty 2

## 2020-11-28 MED ORDER — LORATADINE 10 MG PO TABS: 10.0000 mg | ORAL_TABLET | Freq: Every day | ORAL | Status: DC

## 2020-11-28 MED ORDER — ONDANSETRON HCL 4 MG/2ML IJ SOLN: 4.0000 mg | Freq: Once | INTRAMUSCULAR | Status: DC | PRN

## 2020-11-28 MED ORDER — ACETAMINOPHEN 325 MG PO TABS
ORAL_TABLET | ORAL | Status: AC
  Administered 2020-11-28: 650 mg via ORAL
  Filled 2020-11-28: qty 2

## 2020-11-28 MED ORDER — CEFAZOLIN SODIUM-DEXTROSE 2-4 GM/100ML-% IV SOLN
2.0000 g | INTRAVENOUS | Status: AC
  Administered 2020-11-28: 2 g via INTRAVENOUS
  Filled 2020-11-28: qty 100

## 2020-11-28 MED ORDER — LACTATED RINGERS IV SOLN: INTRAVENOUS | Status: DC

## 2020-11-28 MED ORDER — FENTANYL CITRATE (PF) 100 MCG/2ML IJ SOLN
INTRAMUSCULAR | Status: DC | PRN
  Administered 2020-11-28 (×3): 50 ug via INTRAVENOUS

## 2020-11-28 MED ORDER — FENTANYL CITRATE (PF) 100 MCG/2ML IJ SOLN
INTRAMUSCULAR | Status: AC
  Administered 2020-11-28: 50 ug via INTRAVENOUS
  Filled 2020-11-28: qty 2

## 2020-11-28 MED ORDER — FENTANYL CITRATE (PF) 100 MCG/2ML IJ SOLN: 25.0000 ug | INTRAMUSCULAR | Status: DC | PRN

## 2020-11-28 MED ORDER — BUPIVACAINE HCL (PF) 0.25 % IJ SOLN
INTRAMUSCULAR | Status: AC
  Filled 2020-11-28: qty 30

## 2020-11-28 MED ORDER — TECHNETIUM TC 99M TILMANOCEPT KIT
1.0000 | PACK | Freq: Once | INTRAVENOUS | Status: AC | PRN
  Administered 2020-11-28: 1 via INTRADERMAL

## 2020-11-28 MED ORDER — METHYLENE BLUE 0.5 % INJ SOLN
INTRAVENOUS | Status: AC
  Filled 2020-11-28: qty 10

## 2020-11-28 MED ORDER — PROPOFOL 10 MG/ML IV BOLUS
INTRAVENOUS | Status: AC
  Filled 2020-11-28: qty 20

## 2020-11-28 MED ORDER — ENSURE PRE-SURGERY PO LIQD: 296.0000 mL | Freq: Once | ORAL | Status: DC

## 2020-11-28 MED ORDER — EPHEDRINE SULFATE-NACL 50-0.9 MG/10ML-% IV SOSY
PREFILLED_SYRINGE | INTRAVENOUS | Status: DC | PRN
  Administered 2020-11-28: 10 mg via INTRAVENOUS
  Administered 2020-11-28: 5 mg via INTRAVENOUS
  Administered 2020-11-28 (×2): 10 mg via INTRAVENOUS
  Administered 2020-11-28: 5 mg via INTRAVENOUS

## 2020-11-28 MED ORDER — AMISULPRIDE (ANTIEMETIC) 5 MG/2ML IV SOLN
INTRAVENOUS | Status: AC
  Filled 2020-11-28: qty 4

## 2020-11-28 MED ORDER — KETOROLAC TROMETHAMINE 15 MG/ML IJ SOLN
15.0000 mg | INTRAMUSCULAR | Status: AC
  Administered 2020-11-28: 15 mg via INTRAVENOUS
  Filled 2020-11-28: qty 1

## 2020-11-28 MED ORDER — ONDANSETRON HCL 4 MG/2ML IJ SOLN: 4.0000 mg | Freq: Four times a day (QID) | INTRAMUSCULAR | Status: DC | PRN

## 2020-11-28 MED ORDER — ACETAMINOPHEN 325 MG PO TABS
650.0000 mg | ORAL_TABLET | Freq: Four times a day (QID) | ORAL | Status: DC | PRN
  Administered 2020-11-28 – 2020-11-29 (×3): 650 mg via ORAL
  Filled 2020-11-28 (×3): qty 2

## 2020-11-28 MED ORDER — ONDANSETRON HCL 4 MG/2ML IJ SOLN
INTRAMUSCULAR | Status: AC
  Filled 2020-11-28: qty 2

## 2020-11-28 MED ORDER — FENTANYL CITRATE (PF) 250 MCG/5ML IJ SOLN
INTRAMUSCULAR | Status: AC
  Filled 2020-11-28: qty 5

## 2020-11-28 MED ORDER — ORAL CARE MOUTH RINSE: 15.0000 mL | Freq: Once | OROMUCOSAL | Status: AC

## 2020-11-28 MED ORDER — DEXAMETHASONE SODIUM PHOSPHATE 10 MG/ML IJ SOLN
INTRAMUSCULAR | Status: DC | PRN
  Administered 2020-11-28: 6 mg via INTRAVENOUS

## 2020-11-28 MED ORDER — OXYCODONE HCL 5 MG PO TABS
5.0000 mg | ORAL_TABLET | ORAL | Status: DC | PRN
  Filled 2020-11-28: qty 2

## 2020-11-28 MED ORDER — SIMETHICONE 80 MG PO CHEW: 40.0000 mg | CHEWABLE_TABLET | Freq: Four times a day (QID) | ORAL | Status: DC | PRN

## 2020-11-28 MED ORDER — BUPIVACAINE-EPINEPHRINE 0.25% -1:200000 IJ SOLN
INTRAMUSCULAR | Status: DC | PRN
  Administered 2020-11-28: 1 mL

## 2020-11-28 MED ORDER — LIDOCAINE 2% (20 MG/ML) 5 ML SYRINGE
INTRAMUSCULAR | Status: AC
  Filled 2020-11-28: qty 5

## 2020-11-28 MED ORDER — 0.9 % SODIUM CHLORIDE (POUR BTL) OPTIME
TOPICAL | Status: DC | PRN
  Administered 2020-11-28: 1000 mL

## 2020-11-28 MED ORDER — PROPOFOL 10 MG/ML IV BOLUS
INTRAVENOUS | Status: DC | PRN
  Administered 2020-11-28: 200 mg via INTRAVENOUS

## 2020-11-28 MED ORDER — ONDANSETRON HCL 4 MG/2ML IJ SOLN
INTRAMUSCULAR | Status: DC | PRN
  Administered 2020-11-28: 4 mg via INTRAVENOUS

## 2020-11-28 MED ORDER — PROCHLORPERAZINE EDISYLATE 10 MG/2ML IJ SOLN
INTRAMUSCULAR | Status: AC
  Filled 2020-11-28: qty 2

## 2020-11-28 MED ORDER — FENTANYL CITRATE (PF) 100 MCG/2ML IJ SOLN: 50.0000 ug | Freq: Once | INTRAMUSCULAR | Status: AC

## 2020-11-28 MED ORDER — OXYCODONE HCL 5 MG/5ML PO SOLN: 5.0000 mg | Freq: Once | ORAL | Status: DC | PRN

## 2020-11-28 MED ORDER — SODIUM CHLORIDE 0.9 % IV SOLN: INTRAVENOUS | Status: DC

## 2020-11-28 MED ORDER — DEXAMETHASONE SODIUM PHOSPHATE 10 MG/ML IJ SOLN
INTRAMUSCULAR | Status: AC
  Filled 2020-11-28: qty 1

## 2020-11-28 MED ORDER — SODIUM CHLORIDE (PF) 0.9 % IJ SOLN
INTRAMUSCULAR | Status: AC
  Filled 2020-11-28: qty 10

## 2020-11-28 MED ORDER — CHLORHEXIDINE GLUCONATE 0.12 % MT SOLN
OROMUCOSAL | Status: AC
  Administered 2020-11-28: 15 mL via OROMUCOSAL
  Filled 2020-11-28: qty 15

## 2020-11-28 MED ORDER — AMISULPRIDE (ANTIEMETIC) 5 MG/2ML IV SOLN
10.0000 mg | Freq: Once | INTRAVENOUS | Status: AC | PRN
  Administered 2020-11-28: 10 mg via INTRAVENOUS

## 2020-11-28 MED ORDER — METHOCARBAMOL 500 MG PO TABS
500.0000 mg | ORAL_TABLET | Freq: Four times a day (QID) | ORAL | Status: DC | PRN
  Administered 2020-11-29 (×2): 500 mg via ORAL
  Filled 2020-11-28 (×2): qty 1

## 2020-11-28 MED ORDER — MORPHINE SULFATE (PF) 2 MG/ML IV SOLN: 1.0000 mg | INTRAVENOUS | Status: DC | PRN

## 2020-11-28 MED ORDER — LIDOCAINE HCL (CARDIAC) PF 100 MG/5ML IV SOSY
PREFILLED_SYRINGE | INTRAVENOUS | Status: DC | PRN
  Administered 2020-11-28: 60 mg via INTRAVENOUS

## 2020-11-28 MED ORDER — CHLORHEXIDINE GLUCONATE 0.12 % MT SOLN: 15.0000 mL | Freq: Once | OROMUCOSAL | Status: AC

## 2020-11-28 MED ORDER — MIDAZOLAM HCL 2 MG/2ML IJ SOLN: 2.0000 mg | Freq: Once | INTRAMUSCULAR | Status: AC

## 2020-11-28 MED ORDER — PROCHLORPERAZINE EDISYLATE 10 MG/2ML IJ SOLN
10.0000 mg | Freq: Once | INTRAMUSCULAR | Status: AC
  Administered 2020-11-28: 10 mg via INTRAVENOUS

## 2020-11-28 MED ORDER — ONDANSETRON 4 MG PO TBDP
4.0000 mg | ORAL_TABLET | Freq: Four times a day (QID) | ORAL | Status: DC | PRN
Start: 2020-11-28 — End: 2020-11-29

## 2020-11-28 MED ORDER — ACETAMINOPHEN 325 MG PO TABS: 650.0000 mg | ORAL_TABLET | Freq: Once | ORAL | Status: AC

## 2020-11-28 MED ORDER — HEMOSTATIC AGENTS (NO CHARGE) OPTIME
TOPICAL | Status: DC | PRN
  Administered 2020-11-28 (×2): 1 via TOPICAL

## 2020-11-28 MED ORDER — ACETAMINOPHEN 650 MG RE SUPP: 650.0000 mg | Freq: Four times a day (QID) | RECTAL | Status: DC | PRN

## 2020-11-28 MED ORDER — OXYCODONE HCL 5 MG PO TABS: 5.0000 mg | ORAL_TABLET | Freq: Once | ORAL | Status: DC | PRN

## 2020-11-28 MED ORDER — MIDAZOLAM HCL 2 MG/2ML IJ SOLN
INTRAMUSCULAR | Status: AC
  Administered 2020-11-28: 2 mg via INTRAVENOUS
  Filled 2020-11-28: qty 2

## 2020-11-28 SURGICAL SUPPLY — 67 items
APPLIER CLIP 9.375 MED OPEN (MISCELLANEOUS)
BINDER BREAST LRG (GAUZE/BANDAGES/DRESSINGS) IMPLANT
BINDER BREAST XLRG (GAUZE/BANDAGES/DRESSINGS) ×3 IMPLANT
BIOPATCH RED 1 DISK 7.0 (GAUZE/BANDAGES/DRESSINGS) ×3 IMPLANT
CANISTER SUCT 3000ML PPV (MISCELLANEOUS) ×3 IMPLANT
CHLORAPREP W/TINT 26 (MISCELLANEOUS) ×3 IMPLANT
CLIP APPLIE 9.375 MED OPEN (MISCELLANEOUS) IMPLANT
CLIP VESOCCLUDE MED 6/CT (CLIP) ×3 IMPLANT
CLSR STERI-STRIP ANTIMIC 1/2X4 (GAUZE/BANDAGES/DRESSINGS) ×6 IMPLANT
CNTNR URN SCR LID CUP LEK RST (MISCELLANEOUS) ×4 IMPLANT
CONT SPEC 4OZ STRL OR WHT (MISCELLANEOUS) ×4
COVER PROBE W GEL 5X96 (DRAPES) ×3 IMPLANT
COVER SURGICAL LIGHT HANDLE (MISCELLANEOUS) ×3 IMPLANT
COVER WAND RF STERILE (DRAPES) ×3 IMPLANT
DERMABOND ADVANCED (GAUZE/BANDAGES/DRESSINGS) ×2
DERMABOND ADVANCED .7 DNX12 (GAUZE/BANDAGES/DRESSINGS) ×4 IMPLANT
DEVICE DUBIN SPECIMEN MAMMOGRA (MISCELLANEOUS) ×3 IMPLANT
DRAIN CHANNEL 19F RND (DRAIN) ×3 IMPLANT
DRAPE CHEST BREAST 15X10 FENES (DRAPES) ×3 IMPLANT
DRAPE LAPAROSCOPIC ABDOMINAL (DRAPES) ×3 IMPLANT
DRSG PAD ABDOMINAL 8X10 ST (GAUZE/BANDAGES/DRESSINGS) ×6 IMPLANT
DRSG TEGADERM 4X4.75 (GAUZE/BANDAGES/DRESSINGS) ×3 IMPLANT
ELECT BLADE 4.0 EZ CLEAN MEGAD (MISCELLANEOUS) ×3
ELECT CAUTERY BLADE 6.4 (BLADE) ×3 IMPLANT
ELECT COATED BLADE 2.86 ST (ELECTRODE) ×3 IMPLANT
ELECT REM PT RETURN 9FT ADLT (ELECTROSURGICAL) ×3
ELECTRODE BLDE 4.0 EZ CLN MEGD (MISCELLANEOUS) ×2 IMPLANT
ELECTRODE REM PT RTRN 9FT ADLT (ELECTROSURGICAL) ×2 IMPLANT
EVACUATOR SILICONE 100CC (DRAIN) ×3 IMPLANT
GAUZE SPONGE 4X4 12PLY STRL (GAUZE/BANDAGES/DRESSINGS) ×3 IMPLANT
GLOVE BIO SURGEON STRL SZ7 (GLOVE) ×6 IMPLANT
GLOVE BIOGEL PI IND STRL 7.5 (GLOVE) ×2 IMPLANT
GLOVE BIOGEL PI INDICATOR 7.5 (GLOVE) ×1
GOWN STRL REUS W/ TWL LRG LVL3 (GOWN DISPOSABLE) ×6 IMPLANT
GOWN STRL REUS W/TWL LRG LVL3 (GOWN DISPOSABLE) ×9
KIT BASIN OR (CUSTOM PROCEDURE TRAY) ×3 IMPLANT
KIT MARKER MARGIN INK (KITS) ×3 IMPLANT
KIT TURNOVER KIT B (KITS) ×3 IMPLANT
LIGHT WAVEGUIDE WIDE FLAT (MISCELLANEOUS) IMPLANT
MARKER SKIN DUAL TIP RULER LAB (MISCELLANEOUS) ×3 IMPLANT
NEEDLE 18GX1X1/2 (RX/OR ONLY) (NEEDLE) IMPLANT
NEEDLE FILTER BLUNT 18X 1/2SAF (NEEDLE)
NEEDLE FILTER BLUNT 18X1 1/2 (NEEDLE) IMPLANT
NEEDLE HYPO 25GX1X1/2 BEV (NEEDLE) ×3 IMPLANT
NS IRRIG 1000ML POUR BTL (IV SOLUTION) ×3 IMPLANT
PACK GENERAL/GYN (CUSTOM PROCEDURE TRAY) ×3 IMPLANT
PAD ARMBOARD 7.5X6 YLW CONV (MISCELLANEOUS) ×3 IMPLANT
PENCIL SMOKE EVACUATOR (MISCELLANEOUS) ×6 IMPLANT
PIN SAFETY STERILE (MISCELLANEOUS) ×3 IMPLANT
SPECIMEN JAR X LARGE (MISCELLANEOUS) ×3 IMPLANT
STAPLER VISISTAT 35W (STAPLE) ×3 IMPLANT
STRIP CLOSURE SKIN 1/2X4 (GAUZE/BANDAGES/DRESSINGS) ×3 IMPLANT
SUT ETHILON 2 0 FS 18 (SUTURE) ×3 IMPLANT
SUT MNCRL AB 4-0 PS2 18 (SUTURE) ×3 IMPLANT
SUT MON AB 5-0 PS2 18 (SUTURE) ×3 IMPLANT
SUT SILK 2 0 SH (SUTURE) ×3 IMPLANT
SUT VIC AB 2-0 SH 27 (SUTURE) ×4
SUT VIC AB 2-0 SH 27XBRD (SUTURE) ×4 IMPLANT
SUT VIC AB 3-0 54X BRD REEL (SUTURE) ×2 IMPLANT
SUT VIC AB 3-0 BRD 54 (SUTURE) ×3
SUT VIC AB 3-0 SH 18 (SUTURE) ×3 IMPLANT
SUT VIC AB 3-0 SH 27 (SUTURE) ×3
SUT VIC AB 3-0 SH 27X BRD (SUTURE) ×2 IMPLANT
SUT VIC AB 3-0 SH 8-18 (SUTURE) ×3 IMPLANT
SYR CONTROL 10ML LL (SYRINGE) ×3 IMPLANT
TOWEL GREEN STERILE (TOWEL DISPOSABLE) ×3 IMPLANT
TOWEL GREEN STERILE FF (TOWEL DISPOSABLE) ×3 IMPLANT

## 2020-11-28 NOTE — Anesthesia Postprocedure Evaluation (Signed)
Anesthesia Post Note  Patient: [facility]  Procedure(s) Performed: RIGHT MASTECTOMY WITH RIGHT AXILLARY SENTINEL LYMPH NODE BIOPSY (Right Breast) RADIOACTIVE SEED GUIDED RIGHT AXILLARY AXILLARY SENTINEL LYMPH NODE EXCISION (Right Breast) RADIOACTIVE SEED GUIDED LEFT EXCISIONAL BREAST BIOPSY (Left Breast)     Patient location during evaluation: PACU Anesthesia Type: General Level of consciousness: awake and alert Pain management: pain level controlled Vital Signs Assessment: post-procedure vital signs reviewed and stable Respiratory status: spontaneous breathing, nonlabored ventilation and respiratory function stable Cardiovascular status: blood pressure returned to baseline and stable Postop Assessment: no apparent nausea or vomiting Anesthetic complications: no   No complications documented.  Last Vitals:  Vitals:   11/28/20 1350 11/28/20 1414  BP: 126/69 121/62  Pulse: 75 78  Resp: 20 20  Temp:  36.9 C  SpO2: 100%     Last Pain:  Vitals:   11/28/20 1428  TempSrc:   PainSc: 2                  Lidia Collum

## 2020-11-28 NOTE — Transfer of Care (Signed)
Immediate Anesthesia Transfer of Care Note  Patient: [facility]  Procedure(s) Performed: RIGHT MASTECTOMY WITH RIGHT AXILLARY SENTINEL LYMPH NODE BIOPSY (Right Breast) RADIOACTIVE SEED GUIDED RIGHT AXILLARY AXILLARY SENTINEL LYMPH NODE EXCISION (Right Breast) RADIOACTIVE SEED GUIDED LEFT EXCISIONAL BREAST BIOPSY (Left Breast)  Patient Location: PACU  Anesthesia Type:General and GA combined with regional for post-op pain  Level of Consciousness: sedated  Airway & Oxygen Therapy: Patient Spontanous Breathing and Patient connected to nasal cannula oxygen  Post-op Assessment: Report given to RN and Post -op Vital signs reviewed and stable  Post vital signs: Reviewed and stable  Last Vitals:  Vitals Value Taken Time  BP 120/63 11/28/20 1232  Temp    Pulse 85 11/28/20 1234  Resp 20 11/28/20 1234  SpO2 100 % 11/28/20 1234  Vitals shown include unvalidated device data.  Last Pain:  Vitals:   11/28/20 0927  PainSc: 0-No pain      Patients Stated Pain Goal: 3 (18/56/31 4970)  Complications: No complications documented.

## 2020-11-28 NOTE — Discharge Instructions (Signed)
[facility] °[phone] ° °MASTECTOMY: POST OP INSTRUCTIONS °Take 400 mg of ibuprofen every 8 hours or 650 mg tylenol every 6 hours for next 72 hours then as needed. Use ice several times daily also. °Always review your discharge instruction sheet given to you by the facility where your surgery was performed. ° °IF YOU HAVE DISABILITY OR FAMILY LEAVE FORMS, YOU MUST BRING THEM TO THE OFFICE FOR PROCESSING.   °DO NOT GIVE THEM TO YOUR DOCTOR. °A prescription for pain medication may be given to you upon discharge.  Take your pain medication as prescribed, if needed.  If narcotic pain medicine is not needed, then you may take acetaminophen (Tylenol), naprosyn (Alleve) or ibuprofen (Advil) as needed. °1. Take your usually prescribed medications unless otherwise directed. °2. If you need a refill on your pain medication, please contact your pharmacy.  They will contact our office to request authorization.  Prescriptions will not be filled after 5pm or on week-ends. °3. You should follow a light diet the first few days after arrival home, such as soup and crackers, etc.  Resume your normal diet the day after surgery. °4. Most patients will experience some swelling and bruising on the chest and underarm.  Ice packs will help.  Swelling and bruising can take several days to resolve. Wear the binder day and night until you return to the office.  °5. It is common to experience some constipation if taking pain medication after surgery.  Increasing fluid intake and taking a stool softener (such as Colace) will usually help or prevent this problem from occurring.  A mild laxative (Milk of Magnesia or Miralax) should be taken according to package instructions if there are no bowel movements after 48 hours. °6. Unless discharge instructions indicate otherwise, leave your bandage dry and in place until your next appointment in 3-5 days.  You may take a limited sponge bath.  No tube baths or showers until the  drains are removed.  You may have steri-strips (small skin tapes) in place directly over the incision.  These strips should be left on the skin for 7-10 days. If you have glue it will come off in next couple week.  Any sutures will be removed at an office visit °7. DRAINS:  If you have drains in place, it is important to keep a list of the amount of drainage produced each day in your drains.  Before leaving the hospital, you should be instructed on drain care.  Call our office if you have any questions about your drains. I will remove your drains when they put out less than 30 cc or ml for 2 consecutive days. °8. ACTIVITIES:  You may resume regular (light) daily activities beginning the next day--such as daily self-care, walking, climbing stairs--gradually increasing activities as tolerated.  You may have sexual intercourse when it is comfortable.  Refrain from any heavy lifting or straining until approved by your doctor. °a. You may drive when you are no longer taking prescription pain medication, you can comfortably wear a seatbelt, and you can safely maneuver your car and apply brakes. °b. RETURN TO WORK:  __________________________________________________________ °9. You should see your doctor in the office for a follow-up appointment approximately 3-5 days after your surgery.  Your doctor’s nurse will typically make your follow-up appointment when she calls you with your pathology report.  Expect your pathology report 3-4business days after surgery. °10. OTHER INSTRUCTIONS: ______________________________________________________________________________________________ ____________________________________________________________________________________________ °WHEN TO CALL YOUR DR Aneshia Jacquet: °1. Fever over 101.0 °  2. Nausea and/or vomiting °3. Extreme swelling or bruising °4. Continued bleeding from incision. °5. Increased pain, redness, or drainage from the incision. °The clinic staff is available to answer your  questions during regular business hours.  Please don’t hesitate to call and ask to speak to one of the nurses for clinical concerns.  If you have a medical emergency, go to the nearest emergency room or call 911.  A surgeon from [facility] is always on call at the hospital. °[address], [address], [place], [place]  [address] ? [address] [address], [place], [place]   [address] °[phone] ? [phone] ? FAX [phone] °Web site: [email or web address] ° °

## 2020-11-28 NOTE — H&P (Signed)
64 yof I saw previously with palpable right breast mass. she has fh in her mom, sister and maternal aunt. her sister has recently been treated and had negative genetics. she for some reason has not had her genetic testing. she had a mm and [place] that showed two right breast masses. at 7 oclock there is a 2.3 cm mass and a 2.2 cm mass at 9 oclock. there is also a single abnormal node present. 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 the node is positive. she then had mri that shows a 7x3x3.5 cm right breast malignancy from ra to lower outer right breast with single abnormal right axillary node. there was a 6 mm lower outer left breast mass on mri that underwent biopsy as well. path showed detached ductal proliferation (not sure what this means) on the left thought to be benign. she has undergone primary chemo/anti her2 therapy with complete clinical and imaging response. she has seen two plastic surgeons to discuss options and is here with her husband today to plan surgery  Past Surgical History [name], MD; 11/03/2020 1:04 PM) Appendectomy  Cesarean Section - 1  Tonsillectomy   Diagnostic Studies History [name], MD; 11/03/2020 1:04 PM) Colonoscopy  1-5 years ago  Allergies Janeann Forehand, CNA; 11/03/2020 11:32 AM) Penicillins  Dermatitis. Fish  Difficulty breathing, Diarrhea. Allergies Reconciled   Medication History Janeann Forehand, CNA; 11/03/2020 11:32 AM) Prochlorperazine Maleate (10MG Tablet, Oral) Active. Ondansetron HCl (8MG Tablet, Oral) Active. Dexamethasone (4MG Tablet, Oral) Active. Systane (0.4-0.3% Gel, Ophthalmic) Active. Acetaminophen (160MG Tablet, Oral) Active. Probiotic (Oral) Active. Vitamin D3 (125 MCG(5000 UT) Tablet Chewable, Oral) Active. ZyrTEC Allergy (10MG Tablet, Oral) Active. Medications Reconciled  Social History [name],  MD; 11/03/2020 1:04 PM) Alcohol use  Occasional alcohol use. Caffeine use  Coffee. No drug use  Tobacco use  Never smoker.  Family History [name], MD; 11/03/2020 1:04 PM) Breast Cancer  Mother, Sister. Depression  Brother. Heart Disease  Brother, Father. Hypertension  Father. Malignant Neoplasm Of Pancreas  Father. Melanoma  Sister.  ROS negative except for what is in HPI  Physical Exam [name] MD; 11/03/2020 1:02 PM) General Mental Status-Alert. Orientation-Oriented X3. Breast Nipples-No Discharge. Breast Lump-No Palpable Breast Mass. Lymphatic Head & Neck General Head & Neck Lymphatics: Bilateral - Description - Normal. Axillary General Axillary Region: Bilateral - Description - Normal. Note: no Haviland adenopathy   Assessment & Plan [name] MD; 11/03/2020 1:04 PM) BREAST CANCER METASTASIZED TO AXILLARY LYMPH NODE, RIGHT (C50.911) Story: Right total mastectomy, right TAD, left breast excisional biopsy we discussed seed guided excision of left breast abnormality to ensure this is fine we discussed seed guided excision of prior pos node and sn biopsy. discussed small risk of recommendation to return to OR for alnd but unlikely given one node pos and response. discussed lymphedema risk postop we had a long discussion about breast. with her size and tumor I think mastectomy still best local therapy. she agrees. she has elected not to undergo reconstruction so will plan for removing nac and right total mastectomy. discussed postop course, risks, drain etc.

## 2020-11-28 NOTE — Op Note (Signed)
Preoperative diagnosis: 1.  Left breast mammographic abnormality 2.  Right breast cancer status post primary systemic therapy Postoperative diagnosis: Same as above Procedure: 1.  Left breast radioactive seed guided excisional biopsy 2.  Right total mastectomy 3.  Right radioactive seed guided lymph node excision 4.  Right axillary sentinel lymph node biopsy Surgeon: Dr. Serita Grammes Anesthesia: General with a right pectoral block Estimated blood loss: 25 cc Complications: None [id number] [place] Blake drain to right side Specimens: 1.  Left breast tissue containing seed and clip marked with paint 2.  Right mastectomy marked short superior, long lateral 3.  Right axillary seed containing node with clip present-the clipped node was next to the node containing the seed and I sent these both as a specimen 4.  Right axillary sentinel lymph node with highest count of 698 Sponge needle count was correct at completion Decision to recovery stable condition  Indications:59 yof I saw previously with palpable right breast mass. she had a mm and [place] that showed two right breast masses. at 7 oclock there is a 2.3 cm mass and a 2.2 cm mass at 9 oclock. there is also a single abnormal node present. 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 the node is positive. she then had mri that shows a 7x3x3.5 cm right breast malignancy from ra to lower outer right breast with single abnormal right axillary node. there was a 6 mm lower outer left breast mass on mri that underwent biopsy as well. path showed detached ductal proliferation on the left thought to be benign. she has undergone primary chemo/anti her2 therapy with complete clinical and imaging response.  She elected proceed with an excisional biopsy of the left-sided lesion as well as a right mastectomy without reconstruction and a right targeted axillary lymph node  dissection.  Procedure: After informed consent was obtained the patient was taken to the operating room.  She underwent a pectoral block on the right side.  Antibiotics were given.  SCDs were in place.  She was placed under general anesthesia without complication.  She was prepped and draped in the standard sterile surgical fashion.  A surgical timeout was then performed.  I first did the left side.  I identified the seed in the central portion of the breast.  I then infiltrated Marcaine.  I made a periareolar incision in order to hide the scar later.  I then used the neoprobe to remove the seed in the lesion.  Mammogram confirmed removal of the seed and the clip.  The biopsy cavity was visible to me as well.  I then obtained hemostasis.  I closed this with 2-0 Vicryl, 3-0 Vicryl, and 5-0 Monocryl.  Glue and Steri-Strips were applied.  I then made an elliptical incision to encompass the nipple areola on the right side.  I carried flaps to the clavicle, parasternal region, inframammary fold, and the latissimus laterally.  I then remove the breast tissue and the fascia from the pectoralis muscle.  I marked this as above and passed off the table.  I then noted the seed containing node in the axilla.  There was an adjacent node and the seed was placed in the node next to than 1 with the clip.  I excised both of these.  These were both also sentinel nodes.  I confirmed removal of the seed and the clip.  There was an additional sentinel  node superior of this that was not a palpable node but it was radioactive.  I excised this.  The background radioactivity was negligible.  There were no palpable nodes present.  I then obtained hemostasis.  I placed a 60 [place] Blake drain and secured this with a 2-0 nylon suture.  I closed the lateral tissue to the chest wall with a 2-0 Vicryl.  I then closed the skin with 3-0 Vicryl suture and a 4-0 Monocryl.  Glue and Steri-Strips were applied.  She tolerated this well was  extubated transferred to recovery stable.

## 2020-11-28 NOTE — Anesthesia Procedure Notes (Signed)
Anesthesia Regional Block: Pectoralis block   Pre-Anesthetic Checklist: ,, timeout performed, Correct Patient, Correct Site, Correct Laterality, Correct Procedure, Correct Position, site marked, Risks and benefits discussed,  Surgical consent,  Pre-op evaluation,  At surgeon's request and post-op pain management  Laterality: Right  Prep: chloraprep       Needles:  Injection technique: Single-shot  Needle Type: Echogenic Stimulator Needle     Needle Length: 10cm  Needle Gauge: 20     Additional Needles:   Procedures:,,,, ultrasound used (permanent image in chart),,,,  Narrative:  Start time: 11/28/2020 10:20 AM End time: 11/28/2020 10:24 AM Injection made incrementally with aspirations every 5 mL.  Performed by: Personally  Anesthesiologist: Lidia Collum, MD  Additional Notes: Standard monitors applied. Skin prepped. Good needle visualization with ultrasound. Injection made in 5cc increments with no resistance to injection. Patient tolerated the procedure well.

## 2020-11-28 NOTE — Anesthesia Procedure Notes (Signed)
Procedure Name: LMA Insertion Date/Time: 11/28/2020 10:55 AM Performed by: Ignacia Bayley, CRNA Pre-anesthesia Checklist: Patient identified, Patient being monitored, Emergency Drugs available, Timeout performed and Suction available Patient Re-evaluated:Patient Re-evaluated prior to induction Oxygen Delivery Method: Circle System Utilized Preoxygenation: Pre-oxygenation with 100% oxygen Induction Type: IV induction Ventilation: Mask ventilation without difficulty LMA: LMA inserted LMA Size: 4.0 Number of attempts: 1 Placement Confirmation: positive ETCO2 and breath sounds checked- equal and bilateral

## 2020-11-29 ENCOUNTER — Encounter (HOSPITAL_COMMUNITY): Payer: Self-pay | Admitting: General Surgery

## 2020-11-29 DIAGNOSIS — C773 Secondary and unspecified malignant neoplasm of axilla and upper limb lymph nodes: Secondary | ICD-10-CM | POA: Diagnosis not present

## 2020-11-29 DIAGNOSIS — Z803 Family history of malignant neoplasm of breast: Secondary | ICD-10-CM | POA: Diagnosis not present

## 2020-11-29 DIAGNOSIS — C50911 Malignant neoplasm of unspecified site of right female breast: Secondary | ICD-10-CM | POA: Diagnosis not present

## 2020-11-29 DIAGNOSIS — N6082 Other benign mammary dysplasias of left breast: Secondary | ICD-10-CM | POA: Diagnosis not present

## 2020-11-29 MED ORDER — IBUPROFEN 600 MG PO TABS
600.0000 mg | ORAL_TABLET | Freq: Three times a day (TID) | ORAL | Status: DC
  Administered 2020-11-29: 600 mg via ORAL
  Filled 2020-11-29: qty 3

## 2020-11-29 MED ORDER — METHOCARBAMOL 500 MG PO TABS: 500.0000 mg | ORAL_TABLET | Freq: Four times a day (QID) | ORAL | 0 refills | Status: DC | PRN

## 2020-11-29 MED ORDER — TRAMADOL HCL 50 MG PO TABS: 100.0000 mg | ORAL_TABLET | Freq: Four times a day (QID) | ORAL | 0 refills | Status: DC | PRN

## 2020-11-29 NOTE — Discharge Summary (Signed)
Physician Discharge Summary  Patient ID: Teresa Sheppard MRN: [id number] DOB/AGE: [birth year] 60 y.o.  Admit date: 11/28/2020 Discharge date: 11/29/2020  Admission Diagnoses: Breast cancer  Discharge Diagnoses:  Active Problems:   S/P mastectomy, left   Discharged Condition: good  Hospital Course: 60 yof s/p right mastectomy and TAD, left breast excisional biopsy doing well following am.  Consults: None  Significant Diagnostic Studies: none  Treatments: surgery: right mastectomy, right TAD, left breast ex biopsy  Discharge Exam: Blood pressure 120/71, pulse (!) 57, temperature 97.6 F (36.4 C), temperature source Oral, resp. rate 16, height 5' 9.5" (1.765 m), weight 67.2 kg, SpO2 98 %. right mastectomy with viable flaps and no hematoma, drain ss  Disposition: Discharge disposition: 01-Home or Self Care        Allergies as of 11/29/2020      Reactions   Shellfish-derived Products Anaphylaxis   Per patient whenever she consumes a lot gives tightness of throat   Penicillins Rash   Ancef tolerated well. No adverse reaction      Medication List    TAKE these medications   acetaminophen 325 MG tablet Commonly known as: TYLENOL Take 325 mg by mouth every 6 (six) hours as needed (headaches / pain).   Align 4 MG Caps Take 4 mg by mouth daily.   azithromycin 250 MG tablet Commonly known as: Zithromax Take as directed   cholecalciferol 25 MCG (1000 UNIT) tablet Commonly known as: VITAMIN D3 Take 1,000 Units by mouth daily.   Cranberry-Vitamin C 250-60 MG Caps Take 1 tablet by mouth in the morning and at bedtime.   HAIR/SKIN/NAILS/BIOTIN PO Take 1 capsule by mouth daily.   EMERGEN-C IMMUNE PLUS PO Take 1 each by mouth daily.   loratadine 10 MG tablet Commonly known as: CLARITIN Take 10 mg by mouth daily.   methocarbamol 500 MG tablet Commonly known as: ROBAXIN Take 1 tablet (500 mg total) by mouth every 6 (six) hours as needed for muscle spasms.    prochlorperazine 10 MG tablet Commonly known as: COMPAZINE Take 10 mg by mouth every 6 (six) hours as needed for nausea or vomiting.   SYSTANE OP Place 1 drop into both eyes daily as needed (dry eyes).   traMADol 50 MG tablet Commonly known as: ULTRAM Take 2 tablets (100 mg total) by mouth every 6 (six) hours as needed.   urea 20 % lotion Commonly known as: CARMOL Apply 1 application topically as needed.       Follow-up Information    [name], MD In 2 weeks.   Specialty: General Surgery Contact information: [address] [place] [address] [phone]               Signed: [name] 11/29/2020, 7:07 AM

## 2020-11-29 NOTE — Progress Notes (Signed)
Patient anticipated to be discharged today, no complaints of n/v or pain this morning. Discharge orders acknowledged, waiting for husband to get here to provide JP drain education.

## 2020-11-29 NOTE — Plan of Care (Signed)
  Problem: Education: Goal: Knowledge of General Education information will improve Description: Including pain rating scale, medication(s)/side effects and non-pharmacologic comfort measures Outcome: Progressing   Problem: Clinical Measurements: Goal: Ability to maintain clinical measurements within normal limits will improve Outcome: Progressing   Problem: Activity: Goal: Risk for activity intolerance will decrease Outcome: Progressing   Problem: Nutrition: Goal: Adequate nutrition will be maintained Outcome: Progressing   Problem: Elimination: Goal: Will not experience complications related to urinary retention Outcome: Progressing

## 2020-11-29 NOTE — Plan of Care (Signed)
  Problem: Education: Goal: Knowledge of General Education information will improve Description: Including pain rating scale, medication(s)/side effects and non-pharmacologic comfort measures Outcome: Adequate for Discharge   

## 2020-12-01 ENCOUNTER — Encounter: Payer: Self-pay | Admitting: *Deleted

## 2020-12-01 ENCOUNTER — Other Ambulatory Visit: Payer: Self-pay | Admitting: *Deleted

## 2020-12-01 DIAGNOSIS — C50811 Malignant neoplasm of overlapping sites of right female breast: Secondary | ICD-10-CM

## 2020-12-01 DIAGNOSIS — Z170 Estrogen receptor positive status [ER+]: Secondary | ICD-10-CM

## 2020-12-03 NOTE — Progress Notes (Signed)
 Patient Care Team: Husain, Karrar, MD as PCP - General (Internal Medicine) Martini, Keisha N, RN as Oncology Nurse Navigator Stuart, Dawn C, RN as Oncology Nurse Navigator [place], Matthew, MD as Consulting Physician (General Surgery) Gudena, Vinay, MD as Consulting Physician (Hematology and Oncology) Moody, John, MD as Consulting Physician (Radiation Oncology)  DIAGNOSIS:    ICD-10-CM   1. Malignant neoplasm of overlapping sites of right breast in female, estrogen receptor positive (HCC)  C50.811    Z17.0       SUMMARY OF ONCOLOGIC HISTORY: Oncology History  Malignant neoplasm of overlapping sites of right breast in female, estrogen receptor positive (HCC)  06/21/2020 Initial Diagnosis   Patient palpated a right breast mass x 1 wk. Mammogram showed multiple confluent masses in the right breast at the 7 o'clock position measuring 2.3cm and at the 9 o'clock position measuring 2.2cm with one right axillary lymph node with cortical thickening. Biopsy showed invasive mammary carcinoma at both positions and in the axilla, grade 2-3, HER-2 equivocal by IHC (2+), positive by FISH (ratio 3.34), ER+ >95%, PR+ 25%, Ki67 60%.   06/29/2020 Cancer Staging   Staging form: Breast, AJCC 8th Edition - Clinical stage from 06/29/2020: Stage IB (cT2, cN1(f), cM0, G3, ER+, PR+, HER2+) - Signed by Gudena, Vinay, MD on 06/29/2020    07/13/2020 - 10/26/2020 Chemotherapy      Patient is on Antibody Plan: BREAST TRASTUZUMAB + PERTUZUMAB Q21D     10/28/2020 Breast MRI   Complete imaging response   11/14/2020 Genetic Testing   Negative hereditary cancer genetic testing: no pathogenic variants detected in [facility] Panel.  The report date is Nov 14, 2020.  The BRCAplus panel offered by [facility] and includes sequencing and deletion/duplication analysis for the following 8 genes: ATM, BRCA1, BRCA2, CDH1, CHEK2, PALB2, PTEN, and TP53.  Results of pan-cancer panel are pending.    11/28/2020 Surgery   Right  mastectomy ([place]): IDC, grade 3, spanning 4.0cm fibrotic area, clear margins, 1/4 right axillary lymph nodes positive for carcinoma, 0.5cm.   Left lumpectomy: no evidence of malignancy   12/07/2020 -  Chemotherapy    Patient is on Treatment Plan: BREAST ADO-TRASTUZUMAB EMTANSINE (KADCYLA) Q21D         CHIEF COMPLIANT: Follow-up of right breast cancer and recent right mastectomy  INTERVAL HISTORY: Teresa Sheppard is a 59 y.o. with above-mentioned history of right breast cancer who completed neoadjuvant chemotherapy with TCH Perjeta and is currently on Herceptin Perjeta maintenance. She had a right mastectomy on 11/28/2020 that showed invasive ductal carcinoma, grade 3, spanning a fibrotic area of about 4 cm with resection margins for carcinoma; closest is the posterior margin at 0.3 cm. A right axillary lymph node biopsy showed metastatic carcinoma measuring 0.5 cm with no evidence of extranodal extension. She presents to the clinic today for follow-up.   ALLERGIES:  is allergic to shellfish-derived products and penicillins.  MEDICATIONS:  Current Outpatient Medications  Medication Sig Dispense Refill   acetaminophen (TYLENOL) 325 MG tablet Take 325 mg by mouth every 6 (six) hours as needed (headaches / pain).     azithromycin (ZITHROMAX) 250 MG tablet Take as directed 6 each 0   cholecalciferol (VITAMIN D3) 25 MCG (1000 UNIT) tablet Take 1,000 Units by mouth daily.     Cranberry-Vitamin C 250-60 MG CAPS Take 1 tablet by mouth in the morning and at bedtime.     lidocaine-prilocaine (EMLA) cream Apply to affected area once 30 g 3   loratadine (CLARITIN)   10 MG tablet Take 10 mg by mouth daily.     methocarbamol (ROBAXIN) 500 MG tablet Take 1 tablet (500 mg total) by mouth every 6 (six) hours as needed for muscle spasms. 15 tablet 0   Multiple Vitamins-Minerals (EMERGEN-C IMMUNE PLUS PO) Take 1 each by mouth daily.     Multiple Vitamins-Minerals (HAIR/SKIN/NAILS/BIOTIN PO) Take 1 capsule  by mouth daily.     Polyethyl Glycol-Propyl Glycol (SYSTANE OP) Place 1 drop into both eyes daily as needed (dry eyes).     Probiotic Product (ALIGN) 4 MG CAPS Take 4 mg by mouth daily.     prochlorperazine (COMPAZINE) 10 MG tablet Take 10 mg by mouth every 6 (six) hours as needed for nausea or vomiting.     traMADol (ULTRAM) 50 MG tablet Take 2 tablets (100 mg total) by mouth every 6 (six) hours as needed. 10 tablet 0   urea (CARMOL) 20 % lotion Apply 1 application topically as needed.     No current facility-administered medications for this visit.    PHYSICAL EXAMINATION: ECOG PERFORMANCE STATUS: 1 - Symptomatic but completely ambulatory  Vitals:   12/05/20 1447  BP: (!) 113/58  Pulse: 63  Resp: 18  Temp: 97.7 F (36.5 C)  SpO2: 100%   Filed Weights   12/05/20 1447  Weight: 148 lb 6.4 oz (67.3 kg)     LABORATORY DATA:  I have reviewed the data as listed CMP Latest Ref Rng & Units 12/05/2020 11/16/2020 11/08/2020  Glucose 70 - 99 mg/dL 101(H) 117(H) 89  BUN 6 - 20 mg/dL 21(H) 13 13  Creatinine 0.44 - 1.00 mg/dL 0.76 0.71 0.71  Sodium 135 - 145 mmol/L 139 141 142  Potassium 3.5 - 5.1 mmol/L 4.4 4.2 4.1  Chloride 98 - 111 mmol/L 105 108 107  CO2 22 - 32 mmol/L _0 Calcium 8.9 - 10.3 mg/dL 9.2 9.1 9.0  Total Protein 6.5 - 8.1 g/dL 7.0 6.7 6.7  Total Bilirubin 0.3 - 1.2 mg/dL <0.2(L) 0.3 0.3  Alkaline Phos 38 - 126 U/L 74 75 75  AST 15 - 41 U/L _1 ALT 0 - 44 U/L _2 Lab Results  Component Value Date   WBC 4.1 12/05/2020   HGB 10.5 (L) 12/05/2020   HCT 31.5 (L) 12/05/2020   MCV 98.7 12/05/2020   PLT 285 12/05/2020   NEUTROABS 2.2 12/05/2020    ASSESSMENT & PLAN:  Malignant neoplasm of overlapping sites of right breast in female, estrogen receptor positive ([place]) 06/21/2020:Patient palpated a right breast mass x 1 wk. Mammogram showed multiple confluent masses in the right breast at the 7 o'clock position measuring 2.3cm and at the 9 o'clock  position measuring 2.2cm with one right axillary lymph node with cortical thickening. Biopsy showed invasive mammary carcinoma at both positions and in the axilla, grade 2-3, HER-2 equivocal by IHC (2+), positive by FISH (ratio 3.34), ER+ >95%, PR+ 25%, Ki67 60%.   Treatment plan: 1. Neoadjuvant chemotherapy with TCH Perjeta 6 cycles completed 10/26/2020 followed by Herceptin Perjeta versus [name] for 1 year 2.  Right mastectomy [name]): IDC, grade 3, spanning 4.0cm fibrotic area, clear margins, 1/4 right axillary lymph nodes positive for carcinoma, 0.5cm. Left lumpectomy: no evidence of malignancy 3. Followed by adjuvant radiation therapy 4.  Followed by adjuvant antiestrogen therapy 5.  Followed by neratinib ---------------------------------------------------------------------------------------------------------------------------------- Pathology counseling: I discussed the final pathology report of the patient provided  a copy of this report.  I discussed the margins as well as lymph node surgeries. We also discussed the final staging along with previously performed ER/PR and HER-2/neu testing.  Treatment plan: I recommended that she participate in [facility] HER2 trial Phase 3 randomized clinical trial for HER2 positive patients with residual invasive cancer after neoadjuvant chemo.  Randomized between TDM 1 and placebo/Tucatinib x14 cycles  Return to clinic to start TDM 1. Radiation will be consulted  No orders of the defined types were placed in this encounter.  The patient has a good understanding of the overall plan. she agrees with it. she will call with any problems that may develop before the next visit here.  Total time spent: 45 mins including face to face time and time spent for planning, charting and coordination of care  Rulon Eisenmenger, MD, MPH 12/05/2020  I, Thana Ates, am acting as scribe for Dr. Nicholas Lose.  I have reviewed the above documentation for  accuracy and completeness, and I agree with the above.

## 2020-12-05 ENCOUNTER — Other Ambulatory Visit: Payer: Self-pay

## 2020-12-05 ENCOUNTER — Telehealth: Payer: Self-pay | Admitting: *Deleted

## 2020-12-05 ENCOUNTER — Other Ambulatory Visit: Payer: Self-pay | Admitting: Hematology and Oncology

## 2020-12-05 ENCOUNTER — Ambulatory Visit: Payer: BC Managed Care – PPO | Admitting: Hematology and Oncology

## 2020-12-05 ENCOUNTER — Inpatient Hospital Stay: Payer: BC Managed Care – PPO | Attending: Hematology and Oncology | Admitting: Hematology and Oncology

## 2020-12-05 ENCOUNTER — Inpatient Hospital Stay: Payer: BC Managed Care – PPO

## 2020-12-05 ENCOUNTER — Encounter: Payer: Self-pay | Admitting: *Deleted

## 2020-12-05 DIAGNOSIS — Z5112 Encounter for antineoplastic immunotherapy: Secondary | ICD-10-CM | POA: Insufficient documentation

## 2020-12-05 DIAGNOSIS — C50811 Malignant neoplasm of overlapping sites of right female breast: Secondary | ICD-10-CM

## 2020-12-05 DIAGNOSIS — Z170 Estrogen receptor positive status [ER+]: Secondary | ICD-10-CM

## 2020-12-05 DIAGNOSIS — C773 Secondary and unspecified malignant neoplasm of axilla and upper limb lymph nodes: Secondary | ICD-10-CM | POA: Diagnosis not present

## 2020-12-05 LAB — CMP (CANCER CENTER ONLY)
ALT: 18 U/L (ref 0–44)
AST: 25 U/L (ref 15–41)
Albumin: 3.6 g/dL (ref 3.5–5.0)
Alkaline Phosphatase: 74 U/L (ref 38–126)
Anion gap: 5 (ref 5–15)
BUN: 21 mg/dL — ABNORMAL HIGH (ref 6–20)
CO2: 29 mmol/L (ref 22–32)
Calcium: 9.2 mg/dL (ref 8.9–10.3)
Chloride: 105 mmol/L (ref 98–111)
Creatinine: 0.76 mg/dL (ref 0.44–1.00)
GFR, Estimated: >60 mL/min (ref >60)
Glucose, Bld: 101 mg/dL — ABNORMAL HIGH (ref 70–99)
Potassium: 4.4 mmol/L (ref 3.5–5.1)
Sodium: 139 mmol/L (ref 135–145)
Total Bilirubin: <0.2 mg/dL — ABNORMAL LOW (ref 0.3–1.2)
Total Protein: 7 g/dL (ref 6.5–8.1)

## 2020-12-05 LAB — CBC WITH DIFFERENTIAL (CANCER CENTER ONLY)
Abs Immature Granulocytes: 0.01 10*3/uL (ref 0.00–0.07)
Basophils Absolute: 0 10*3/uL (ref 0.0–0.1)
Basophils Relative: 1 %
Eosinophils Absolute: 0.3 10*3/uL (ref 0.0–0.5)
Eosinophils Relative: 8 %
HCT: 31.5 % — ABNORMAL LOW (ref 36.0–46.0)
Hemoglobin: 10.5 g/dL — ABNORMAL LOW (ref 12.0–15.0)
Immature Granulocytes: 0 %
Lymphocytes Relative: 29 %
Lymphs Abs: 1.2 10*3/uL (ref 0.7–4.0)
MCH: 32.9 pg (ref 26.0–34.0)
MCHC: 33.3 g/dL (ref 30.0–36.0)
MCV: 98.7 fL (ref 80.0–100.0)
Monocytes Absolute: 0.3 10*3/uL (ref 0.1–1.0)
Monocytes Relative: 8 %
Neutro Abs: 2.2 10*3/uL (ref 1.7–7.7)
Neutrophils Relative %: 54 %
Platelet Count: 285 10*3/uL (ref 150–400)
RBC: 3.19 MIL/uL — ABNORMAL LOW (ref 3.87–5.11)
RDW: 12.5 % (ref 11.5–15.5)
WBC Count: 4.1 10*3/uL (ref 4.0–10.5)
nRBC: 0 % (ref 0.0–0.2)

## 2020-12-05 LAB — SURGICAL PATHOLOGY

## 2020-12-05 MED ORDER — LIDOCAINE-PRILOCAINE 2.5-2.5 % EX CREA: TOPICAL_CREAM | CUTANEOUS | 3 refills | Status: DC

## 2020-12-05 NOTE — Progress Notes (Signed)
Given the fact that she has residual disease after neoadjuvant chemotherapy for HER2 positive breast cancer, I recommended participation in [facility] HER2 clinical trial. When she comes in today we will have the research team discuss a trial with her.

## 2020-12-05 NOTE — Assessment & Plan Note (Signed)
06/21/2020:Patient palpated a right breast mass x 1 wk. Mammogram showed multiple confluent masses in the right breast at the 7 o'clock position measuring 2.3cm and at the 9 o'clock position measuring 2.2cm with one right axillary lymph node with cortical thickening. Biopsy showed invasive mammary carcinoma at both positions and in the axilla, grade 2-3, HER-2 equivocal by IHC (2+), positive by FISH (ratio 3.34), ER+ >95%, PR+ 25%, Ki67 60%.  Treatment plan: 1. Neoadjuvant chemotherapy with [place] Perjeta 6 cyclescompleted 5/4/2022followed by HerceptinPerjeta versus Kadcylamaintenance for 1 year 2.  Right mastectomy [name]): IDC, grade 3, spanning 4.0cm fibrotic area, clear margins, 1/4 right axillary lymph nodes positive for carcinoma, 0.5cm. Left lumpectomy: no evidence of malignancy 3. Followed by adjuvant radiation therapy 4.Followed by adjuvant antiestrogen therapy 5.Followed by neratinib ---------------------------------------------------------------------------------------------------------------------------------- Pathology counseling: I discussed the final pathology report of the patient provided  a copy of this report. I discussed the margins as well as lymph node surgeries. We also discussed the final staging along with previously performed ER/PR and HER-2/neu testing.  Treatment plan: I recommended that she participate in [facility] HER2 trial Phase 3 randomized clinical trial for HER2 positive patients with residual invasive cancer after neoadjuvant chemo.  Randomized between TDM 1 and placebo/Tucatinib x14 cycles  Return to clinic to start TDM 1.

## 2020-12-05 NOTE — Telephone Encounter (Signed)
Received call from patient inquiring about her next H/P appt that should be scheduled for 6/15.  Informed patient that this would get changed to Kadcyla and confirmed appt for 6/15 at 8am.  Explained that we can go ahead and get her labs today before her appt. With [name]. Appt made for 215. Patient is aware.

## 2020-12-05 NOTE — Research (Signed)
Trial:  The [place] study/ [id number] Patient Teresa Sheppard was identified by [name] as a potential candidate for the above listed study.  This Clinical Research Nurse met with Teresa Sheppard, [id number], on 12/05/20 in a manner and location that ensures patient privacy to discuss participation in the above listed research study.  Patient is Accompanied by her husband .  A copy of the informed consent document and separate HIPAA Authorization was provided to the patient.  Patient reads, speaks, and understands [place].   Patient was provided with the business card of this Nurse and encouraged to contact the research team with any questions.  Approximately 10 minutes were spent with the patient reviewing the informed consent documents.  Patient was provided the option of taking informed consent documents home to review and was encouraged to review at their convenience with their support network, including other care providers. Patient took the consent documents home to review.  The nurse discussed confidentiality of study records with the pt and her husband.  The pt must be able to swallow oral pills.  The pt said that she does have some difficulty, but she can swallow pills with applesauce. The research nurse will see the pt at her next visit to answer any questions about study participation. The pt was told that her participation in this study is voluntary.  The pt was thanked for her interest in the study.  Brion Aliment RN, BSN, CCRP Clinical Research Nurse Lead 12/05/2020 5:15 PM

## 2020-12-06 ENCOUNTER — Telehealth: Payer: Self-pay | Admitting: Genetic Counselor

## 2020-12-06 ENCOUNTER — Telehealth: Payer: Self-pay | Admitting: *Deleted

## 2020-12-06 ENCOUNTER — Ambulatory Visit: Payer: Self-pay | Admitting: Genetic Counselor

## 2020-12-06 DIAGNOSIS — Z1379 Encounter for other screening for genetic and chromosomal anomalies: Secondary | ICD-10-CM

## 2020-12-06 DIAGNOSIS — Z803 Family history of malignant neoplasm of breast: Secondary | ICD-10-CM

## 2020-12-06 DIAGNOSIS — Z800 Family history of malignant neoplasm of digestive organs: Secondary | ICD-10-CM

## 2020-12-06 DIAGNOSIS — C50811 Malignant neoplasm of overlapping sites of right female breast: Secondary | ICD-10-CM

## 2020-12-06 NOTE — Telephone Encounter (Signed)
Received vm call from pt stating that she sees that her BUN is elevated & wants to know what she can do to help this & what impact does it have. Returned call & informed that may be a little dehydrated when drawn & to try to drink extra fluids.  She reports that she is drinking plenty & is urinating OK. Otherwise not extremely elevated & she has been 20 before recently & came back down.  May just be fluke.  She will be in tomorrow for treatment.

## 2020-12-06 NOTE — Telephone Encounter (Signed)
Revealed negative genetic testing of CancerNext-Expanded +RNAinsight Panel.  Discussed that we do not know why she has breast cancer or why there is cancer in the family. It could be familial, due to a different gene that we are not testing, or maybe our current technology may not be able to pick something up.  It will be important for her to keep in contact with genetics to keep up with whether additional testing may be needed.  Discussed pancreatic cancer screening based on family history.

## 2020-12-06 NOTE — Telephone Encounter (Signed)
[name] research study follow up call   The research nurse called the pt to see if she had any questions/concerns about the consent form or hipaa form.  The pt said that she had just started reviewing the consent form today.  The research nurse spent 20 minutes explaining the study, the treatment arms, and the study drug side effects to the pt.  The pt wanted to meet next week after her radiation consult visit on 12/13/20 to go over the consent form in detail with the research nurse.  The pt was told that she would have to make her decision by 12/28/20. The nurse will see the pt tomorrow while the pt is in the clinic receiving her first cycle of Kadcyla to see if she has any further questions about the study.  The pt was thanked for her continued interest in the study.   Brion Aliment RN, BSN, CCRP Clinical Research Nurse Lead 12/06/2020 3:55 PM

## 2020-12-06 NOTE — Progress Notes (Signed)
HPI:  Teresa Sheppard was previously seen in the [facility] clinic due to a personal and family history of cancer and concerns regarding a hereditary predisposition to cancer. Please refer to our prior cancer genetics clinic note for more information regarding our discussion, assessment and recommendations, at the time. Teresa Sheppard recent genetic test results were disclosed to her, as were recommendations warranted by these results. These results and recommendations are discussed in more detail below.  CANCER HISTORY:  Oncology History  Malignant neoplasm of overlapping sites of right breast in female, estrogen receptor positive ([place])  06/21/2020 Initial Diagnosis   Patient palpated a right breast mass x 1 wk. Mammogram showed multiple confluent masses in the right breast at the 7 o'clock position measuring 2.3cm and at the 9 o'clock position measuring 2.2cm with one right axillary lymph node with cortical thickening. Biopsy showed invasive mammary carcinoma at both positions and in the axilla, grade 2-3, HER-2 equivocal by IHC (2+), positive by FISH (ratio 3.34), ER+ >95%, PR+ 25%, Ki67 60%.   06/29/2020 Cancer Staging   Staging form: Breast, AJCC 8th Edition - Clinical stage from 06/29/2020: Stage IB (cT2, cN1(f), cM0, G3, ER+, PR+, HER2+) - Signed by Nicholas Lose, MD on 06/29/2020    07/13/2020 - 10/26/2020 Chemotherapy      Patient is on Antibody Plan: BREAST TRASTUZUMAB + PERTUZUMAB Q21D     10/28/2020 Breast MRI   Complete imaging response   11/14/2020 Genetic Testing   Negative hereditary cancer genetic testing: no pathogenic variants detected in [facility] BRCAPlus Panel and [facility] CancerNext-Expanded +RNAinsight Panel.  The report dates are Nov 14, 2020 and December 05, 2020, respectively.    The BRCAplus panel offered by [employer] and includes sequencing and deletion/duplication analysis for the following 8 genes: ATM, BRCA1, BRCA2, CDH1, CHEK2, PALB2, PTEN, and TP53.  The  CancerNext-Expanded gene panel offered by [facility] and includes sequencing, rearrangement, and RNA analysis for the following 77 genes: AIP, ALK, APC, ATM, AXIN2, BAP1, BARD1, BLM, BMPR1A, BRCA1, BRCA2, BRIP1, CDC73, CDH1, CDK4, CDKN1B, CDKN2A, CHEK2, CTNNA1, DICER1, FANCC, FH, FLCN, GALNT12, KIF1B, LZTR1, MAX, MEN1, MET, MLH1, MSH2, MSH3, MSH6, MUTYH, NBN, NF1, NF2, NTHL1, PALB2, PHOX2B, PMS2, POT1, PRKAR1A, PTCH1, PTEN, RAD51C, RAD51D, RB1, RECQL, RET, SDHA, SDHAF2, SDHB, SDHC, SDHD, SMAD4, SMARCA4, SMARCB1, SMARCE1, STK11, SUFU, TMEM127, TP53, TSC1, TSC2, VHL and XRCC2 (sequencing and deletion/duplication); EGFR, EGLN1, HOXB13, KIT, MITF, PDGFRA, POLD1, and POLE (sequencing only); EPCAM and GREM1 (deletion/duplication only).    11/28/2020 Surgery   Right mastectomy [name]): IDC, grade 3, spanning 4.0cm fibrotic area, clear margins, 1/4 right axillary lymph nodes positive for carcinoma, 0.5cm.   Left lumpectomy: no evidence of malignancy   12/07/2020 -  Chemotherapy    Patient is on Treatment Plan: BREAST ADO-TRASTUZUMAB EMTANSINE ([place]) Q21D         FAMILY HISTORY:  We obtained a detailed, 4-generation family history.  Significant diagnoses are listed below: Family History  Problem Relation Age of Onset   Breast cancer Mother 15       metastatic   Breast cancer Sister 40   Breast cancer Maternal Aunt        dx 63s   Pancreatic cancer Father 75   Pancreatic cancer Paternal Grandfather        dx 77s   Breast cancer Sister 87   Leukemia Nephew 57   Cancer Maternal Aunt        unknown type; ?bone; dx late 25s  Teresa Sheppard is [place] of previous family history of genetic testing for hereditary cancer risks besides that mentioned above. Patient's maternal ancestors are of [place] descent, and paternal ancestors are of [place], [place], [place], and [place] descent. There is no reported Ashkenazi Jewish ancestry. There is no known consanguinity.  GENETIC TEST RESULTS:  Genetic testing reported out on December 05, 2020.  The [facility] CancerNext-Expanded +RNAinsight Panel found no pathogenic mutations. The CancerNext-Expanded gene panel offered by [facility] and includes sequencing, rearrangement, and RNA analysis for the following 77 genes: AIP, ALK, APC, ATM, AXIN2, BAP1, BARD1, BLM, BMPR1A, BRCA1, BRCA2, BRIP1, CDC73, CDH1, CDK4, CDKN1B, CDKN2A, CHEK2, CTNNA1, DICER1, FANCC, FH, FLCN, GALNT12, KIF1B, LZTR1, MAX, MEN1, MET, MLH1, MSH2, MSH3, MSH6, MUTYH, NBN, NF1, NF2, NTHL1, PALB2, PHOX2B, PMS2, POT1, PRKAR1A, PTCH1, PTEN, RAD51C, RAD51D, RB1, RECQL, RET, SDHA, SDHAF2, SDHB, SDHC, SDHD, SMAD4, SMARCA4, SMARCB1, SMARCE1, STK11, SUFU, TMEM127, TP53, TSC1, TSC2, VHL and XRCC2 (sequencing and deletion/duplication); EGFR, EGLN1, HOXB13, KIT, MITF, PDGFRA, POLD1, and POLE (sequencing only); EPCAM and GREM1 (deletion/duplication only).   The test report has been scanned into EPIC and is located under the Molecular Pathology section of the Results Review tab.  A portion of the result report is included below for reference.     We discussed with Teresa Sheppard that because current genetic testing is not perfect, it is possible there may be a gene mutation in one of these genes that current testing cannot detect, but that chance is small.  We also discussed, that there could be another gene that has not yet been discovered, or that we have not yet tested, that is responsible for the cancer diagnoses in the family. It is also possible there is a hereditary cause for the cancer in the family that Teresa Sheppard did not inherit and therefore was not identified in her testing.  Therefore, it is important to remain in touch with cancer genetics in the future so that we can continue to offer Teresa Sheppard the most up to date genetic testing.   ADDITIONAL GENETIC TESTING: We discussed with Teresa Sheppard that her genetic testing was fairly extensive.  If there are genes identified to increase cancer  risk that can be analyzed in the future, we would be happy to discuss and coordinate this testing at that time.    CANCER SCREENING RECOMMENDATIONS: Teresa Sheppard's test result is considered negative (normal).  This means that we have not identified a hereditary cause for her personal history of cancer at this time. Most cancers are sporadic or familial, and this negative test suggests that her cancer may fall into this category.    This does not definitively rule out a hereditary predisposition to cancer. It is still possible that there could be genetic mutations that are undetectable by current technology. There could be genetic mutations in genes that have not been tested or identified to increase cancer risk.  Therefore, it is recommended she continue to follow the cancer management and screening guidelines provided by her oncology and primary healthcare provider.   An individual's cancer risk and medical management are not determined by genetic test results alone. Overall cancer risk assessment incorporates additional factors, including personal medical history, family history, and any available genetic information that may result in a personalized plan for cancer prevention and surveillance  Teresa Sheppard has a family history of pancreatic cancer in one first degree relative (father) and one second degree relative (paternal grandfather). Per the International Cancer of the Pancreas Screening (CAPS) Consortium,  individuals with a family history of pancreatic cancer in at least one first degree relative and one second degree relative should consider annual pancreatic cancer screening. We therefore recommended that Teresa Sheppard consider pancreatic cancer screening. Teresa Sheppard does not have a GI specialist with whom she is currently established. Therefore, we will request that Teresa Sheppard be seen by Dr. Bryan Lemma at [facility] for follow up and discussion of appropriate screening options given her family  history of pancreatic cancer at the appropriate time.   RECOMMENDATIONS FOR FAMILY MEMBERS:  Individuals in this family might be at some increased risk of developing cancer, over the general population risk, simply due to the family history of cancer.  We recommended women in this family have a yearly mammogram beginning at age 69, or 34 years younger than the earliest onset of cancer, an annual clinical breast exam, and perform monthly breast self-exams. Women in this family should also have a gynecological exam as recommended by their primary provider. All family members should be referred for colonoscopy starting at age 69.  It is also possible there is a hereditary cause for the cancer in Teresa Sheppard's family that she did not inherit and therefore was not identified in her.  Based on Teresa Sheppard's family history, we recommended her sisters keep up with updated genetic testing as needed. Teresa Sheppard can let us know if we can be of any assistance in coordinating genetic counseling and/or testing for these family members.   FOLLOW-UP: Lastly, we discussed with Teresa Sheppard that cancer genetics is a rapidly advancing field and it is possible that new genetic tests will be appropriate for her and/or her family members in the future. We encouraged her to remain in contact with cancer genetics on an annual basis so we can update her personal and family histories and let her know of advances in cancer genetics that may benefit this family.   Our contact number was provided. Teresa Sheppard. [place] questions were answered to her satisfaction, and she knows she is welcome to call us at anytime with additional questions or concerns.     Zymir Napoli M. Joette Catching, [place], [facility] Genetic Counselor [email or web address] (P) [phone]

## 2020-12-07 ENCOUNTER — Encounter: Payer: Self-pay | Admitting: *Deleted

## 2020-12-07 ENCOUNTER — Other Ambulatory Visit: Payer: Self-pay

## 2020-12-07 ENCOUNTER — Inpatient Hospital Stay: Payer: BC Managed Care – PPO

## 2020-12-07 VITALS — BP 114/65 | HR 71 | Temp 97.9°F | Resp 18

## 2020-12-07 DIAGNOSIS — C50811 Malignant neoplasm of overlapping sites of right female breast: Secondary | ICD-10-CM

## 2020-12-07 DIAGNOSIS — Z170 Estrogen receptor positive status [ER+]: Secondary | ICD-10-CM

## 2020-12-07 DIAGNOSIS — Z5112 Encounter for antineoplastic immunotherapy: Secondary | ICD-10-CM | POA: Diagnosis not present

## 2020-12-07 DIAGNOSIS — C773 Secondary and unspecified malignant neoplasm of axilla and upper limb lymph nodes: Secondary | ICD-10-CM | POA: Diagnosis not present

## 2020-12-07 MED ORDER — ACETAMINOPHEN 325 MG PO TABS
650.0000 mg | ORAL_TABLET | Freq: Once | ORAL | Status: AC
  Administered 2020-12-07: 650 mg via ORAL

## 2020-12-07 MED ORDER — SODIUM CHLORIDE 0.9% FLUSH
10.0000 mL | INTRAVENOUS | Status: DC | PRN
  Administered 2020-12-07: 10 mL
  Filled 2020-12-07: qty 10

## 2020-12-07 MED ORDER — SODIUM CHLORIDE 0.9 % IV SOLN
Freq: Once | INTRAVENOUS | Status: AC
  Filled 2020-12-07: qty 250

## 2020-12-07 MED ORDER — DIPHENHYDRAMINE HCL 25 MG PO CAPS
ORAL_CAPSULE | ORAL | Status: AC
  Filled 2020-12-07: qty 2

## 2020-12-07 MED ORDER — ACETAMINOPHEN 325 MG PO TABS
ORAL_TABLET | ORAL | Status: AC
  Filled 2020-12-07: qty 2

## 2020-12-07 MED ORDER — DIPHENHYDRAMINE HCL 25 MG PO CAPS
50.0000 mg | ORAL_CAPSULE | Freq: Once | ORAL | Status: AC
  Administered 2020-12-07: 50 mg via ORAL

## 2020-12-07 MED ORDER — HEPARIN SOD (PORK) LOCK FLUSH 100 UNIT/ML IV SOLN
500.0000 [IU] | Freq: Once | INTRAVENOUS | Status: AC | PRN
  Administered 2020-12-07: 500 [IU]
  Filled 2020-12-07: qty 5

## 2020-12-07 MED ORDER — SODIUM CHLORIDE 0.9 % IV SOLN
260.0000 mg | Freq: Once | INTRAVENOUS | Status: AC
  Administered 2020-12-07: 260 mg via INTRAVENOUS
  Filled 2020-12-07: qty 8

## 2020-12-07 NOTE — Patient Instructions (Signed)
[facility]  Discharge Instructions: Thank you for choosing [facility] to provide your oncology and hematology care.   If you have a lab appointment with the [facility], please go directly to the [facility] and check in at the registration area.   Wear comfortable clothing and clothing appropriate for easy access to any Portacath or PICC line.   We strive to give you quality time with your provider. You may need to reschedule your appointment if you arrive late (15 or more minutes).  Arriving late affects you and other patients whose appointments are after yours.  Also, if you miss three or more appointments without notifying the office, you may be dismissed from the clinic at the provider's discretion.      For prescription refill requests, have your pharmacy contact our office and allow 72 hours for refills to be completed.    Today you received the following chemotherapy and/or immunotherapy agents: Kadcyla(Trastuzumab Ado-Emtansine)    To help prevent nausea and vomiting after your treatment, we encourage you to take your nausea medication as directed.  BELOW ARE SYMPTOMS THAT SHOULD BE REPORTED IMMEDIATELY: *FEVER GREATER THAN 100.4 F (38 C) OR HIGHER *CHILLS OR SWEATING *NAUSEA AND VOMITING THAT IS NOT CONTROLLED WITH YOUR NAUSEA MEDICATION *UNUSUAL SHORTNESS OF BREATH *UNUSUAL BRUISING OR BLEEDING *URINARY PROBLEMS (pain or burning when urinating, or frequent urination) *BOWEL PROBLEMS (unusual diarrhea, constipation, pain near the anus) TENDERNESS IN MOUTH AND THROAT WITH OR WITHOUT PRESENCE OF ULCERS (sore throat, sores in mouth, or a toothache) UNUSUAL RASH, SWELLING OR PAIN  UNUSUAL VAGINAL DISCHARGE OR ITCHING   Items with * indicate a potential emergency and should be followed up as soon as possible or go to the Emergency Department if any problems should occur.  Please show the CHEMOTHERAPY ALERT CARD or IMMUNOTHERAPY  ALERT CARD at check-in to the Emergency Department and triage nurse.  Should you have questions after your visit or need to cancel or reschedule your appointment, please contact [facility]  Dept: [phone]  and follow the prompts.  Office hours are 8:00 a.m. to 4:30 p.m. Monday - Friday. Please note that voicemails left after 4:00 p.m. may not be returned until the following business day.  We are closed weekends and major holidays. You have access to a nurse at all times for urgent questions. Please call the main number to the clinic Dept: [phone] and follow the prompts.   For any non-urgent questions, you may also contact your provider using MyChart. We now offer e-Visits for anyone 42 and older to request care online for non-urgent symptoms. For details visit [email or web address].   Also download the MyChart app! Go to the app store, search "MyChart", open the app, select [facility], and log in with your MyChart username and password.  Due to Covid, a mask is required upon entering the hospital/clinic. If you do not have a mask, one will be given to you upon arrival. For doctor visits, patients may have 1 support person aged 72 or older with them. For treatment visits, patients cannot have anyone with them due to current Covid guidelines and our immunocompromised population.   What is this medication ADO-TRASTUZUMAB EMTANSINE (ADD oh traz TOO zuh mab em TAN zine) is a monoclonalantibody combined with chemotherapy. It is used to treat breast cancer. This medicine may be used for other purposes; ask your health care provider orpharmacist if you have questions. COMMON BRAND NAME(S): Kadcyla  What should I tell my care team before I take this medication? They need to know if you have any of these conditions: heart disease heart failure infection (especially a virus infection such as chickenpox, cold sores, or herpes) liver disease lung or breathing  disease, like asthma tingling of the fingers or toes, or other nerve disorder an unusual or allergic reaction to ado-trastuzumab emtansine, other medications, foods, dyes, or preservatives pregnant or trying to get pregnant breast-feeding How should I use this medication? This medicine is for infusion into a vein. It is given by a health careprofessional in a hospital or clinic setting. Talk to your pediatrician regarding the use of this medicine in children.Special care may be needed. Overdosage: If you think you have taken too much of this medicine contact apoison control center or emergency room at once. NOTE: This medicine is only for you. Do not share this medicine with others. What if I miss a dose? It is important not to miss your dose. Call your doctor or health careprofessional if you are unable to keep an appointment. What may interact with this medication? This medicine may also interact with the following medications: atazanavir boceprevir clarithromycin delavirdine indinavir dalfopristin; quinupristin isoniazid, INH itraconazole ketoconazole nefazodone nelfinavir ritonavir telaprevir telithromycin tipranavir voriconazole This list may not describe all possible interactions. Give your health care provider a list of all the medicines, herbs, non-prescription drugs, or dietary supplements you use. Also tell them if you smoke, drink alcohol, or use illegaldrugs. Some items may interact with your medicine. What should I watch for while using this medication? Visit your doctor for checks on your progress. This drug may make you feel generally unwell. This is not uncommon, as chemotherapy can affect healthy cells as well as cancer cells. Report any side effects. Continue your course oftreatment even though you feel ill unless your doctor tells you to stop. You may need blood work done while you are taking this medicine. Call your doctor or health care professional for advice  if you get a fever, chills or sore throat, or other symptoms of a cold or flu. Do not treat yourself. This drug decreases your body's ability to fight infections. Try toavoid being around people who are sick. Be careful brushing and flossing your teeth or using a toothpick because you may get an infection or bleed more easily. If you have any dental work done,tell your dentist you are receiving this medicine. Avoid taking products that contain aspirin, acetaminophen, ibuprofen, naproxen, or ketoprofen unless instructed by your doctor. These medicines may hide afever. Do not become pregnant while taking this medicine or for 7 months after stopping it, men with female partners should use contraception during treatment and for 4 months after the last dose. Women should inform their doctor if they wish to become pregnant or think they might be pregnant. There is a potential for serious side effects to an unborn child. Do not breast-feed an infant whiletaking this medicine or for 7 months after the last dose. Men who have a partner who is pregnant or who is capable of becoming pregnant should use a condom during sexual activity while taking this medicine and for 4 months after stopping it. Men should inform their doctors if they wish to father a child. This medicine may lower sperm counts. Talk to your health careprofessional or pharmacist for more information. What side effects may I notice from receiving this medication? Side effects that you should report to your doctor or health care professionalas  soon as possible: allergic reactions like skin rash, itching or hives, swelling of the face, lips, or tongue breathing problems chest pain or palpitations fever or chills, sore throat general ill feeling or flu-like symptoms light-colored stools nausea, vomiting pain, tingling, numbness in the hands or feet signs and symptoms of bleeding such as bloody or black, tarry stools; red or dark-brown urine;  spitting up blood or brown material that looks like coffee grounds; red spots on the skin; unusual bruising or bleeding from the eye, gums, or nose swelling of the legs or ankles yellowing of the eyes or skin Side effects that usually do not require medical attention (report to yourdoctor or health care professional if they continue or are bothersome): changes in taste constipation dizziness headache joint pain muscle pain trouble sleeping unusually weak or tired This list may not describe all possible side effects. Call your doctor for medical advice about side effects. You may report side effects to FDA at1-800-FDA-1088. Where should I keep my medication? This drug is given in a hospital or clinic and will not be stored at home. NOTE: This sheet is a summary. It may not cover all possible information. If you have questions about this medicine, talk to your doctor, pharmacist, orhealth care provider.  2022 Elsevier/Gold Standard (2017-11-08 10:03:15)

## 2020-12-08 ENCOUNTER — Telehealth: Payer: Self-pay | Admitting: *Deleted

## 2020-12-08 ENCOUNTER — Telehealth: Payer: Self-pay

## 2020-12-08 NOTE — Telephone Encounter (Signed)
-----   Message from [place], DO sent at 12/08/2020  1:51 PM EDT ----- Johnette Abraham,  We can absolutely get her in for an appointment to discuss PC screening. Thanks for sending her over.   Penelope Fittro, Can you please assist in scheduling OV with me to discuss PC screening. Thanks.   ----- Message ----- From: Ignacia Bayley Sent: 12/06/2020   2:26 PM EDT To: Lavena Bullion, DO  Hi Dr. Bryan Lemma,   I saw Ms. Rockholt for genetic testing, which was negative.  She has a first and second degree relative with pancreatic cancer, and we discussed that she should consider pancreatic cancer screening.  She isn't established with a GI provider in [place].   Would you be able to ask your team to set up an appointment to discuss pancreatic cancer screening?  She is hoping to wait until next May/June given that she is currently going through breast cancer treatment but would appreciate getting on the schedule.  Many thanks!

## 2020-12-08 NOTE — Progress Notes (Signed)
New Breast Cancer Diagnosis: Right Breast  Did patient present with symptoms (if so, please note symptoms) or screening mammography?:Palpable mass    Location and Extent of disease :right breast. Located at 7 o'clock position, measured  2.3 cm in greatest dimension.   A second mass was noted at the 9 o'clock position, measured 2.2 cm with one right axillary lymph node with cortical thickening.  Histology per Pathology Report: grade 3, Invasive Ductal Carcinoma Right Mastectomy 11/28/2020  Receptor Status: ER(positive), PR (positive), Her2-neu (negative), Ki-(1%)  Surgeon and surgical plan, if any: Dr. [name] -Right Mastectomy with right axillary SLN biopsy 11/28/2020   Medical oncologist, treatment if any:   [name] 12/05/2020 Treatment plan: 1. Neoadjuvant chemotherapy with TCH Perjeta 6 cycles completed 10/26/2020 followed by Herceptin Perjeta versus [name] for 1 year 2.  Right mastectomy [name]): IDC, grade 3, spanning 4.0cm fibrotic area, clear margins, 1/4 right axillary lymph nodes positive for carcinoma, 0.5cm. Left lumpectomy: no evidence of malignancy 3. Followed by adjuvant radiation therapy 4.  Followed by adjuvant antiestrogen therapy 5.  Followed by neratinib   Family History of Breast/Ovarian/Prostate Cancer: yes.  Lymphedema issues, if any:  no   Pain issues, if any:  no   SAFETY ISSUES: Prior radiation? no Pacemaker/ICD? no Possible current pregnancy?no Is the patient on methotrexate? no  Current Complaints / other details:   Genetics: Negative Patient and husband have multiple questions. She is on a Clinical trail at this time. Questions and concerns addressed. States she has a slight headache but no real pain.

## 2020-12-09 NOTE — Telephone Encounter (Signed)
Lm on vm for patient to return call to schedule a new patient appt with Dr. Bryan Lemma.

## 2020-12-10 NOTE — Progress Notes (Signed)
Patient Care Team: Wenda Low, MD as PCP - General (Internal Medicine) Rockwell Germany, RN as Oncology Nurse Navigator Mauro Kaufmann, RN as Oncology Nurse Navigator [name], MD as Consulting Physician (General Surgery) Nicholas Lose, MD as Consulting Physician (Hematology and Oncology) Kyung Rudd, MD as Consulting Physician (Radiation Oncology)  DIAGNOSIS:    ICD-10-CM   1. Malignant neoplasm of overlapping sites of right breast in female, estrogen receptor positive ([place])  C50.811    Z17.0       SUMMARY OF ONCOLOGIC HISTORY: Oncology History  Malignant neoplasm of overlapping sites of right breast in female, estrogen receptor positive ([place])  06/21/2020 Initial Diagnosis   Patient palpated a right breast mass x 1 wk. Mammogram showed multiple confluent masses in the right breast at the 7 o'clock position measuring 2.3cm and at the 9 o'clock position measuring 2.2cm with one right axillary lymph node with cortical thickening. Biopsy showed invasive mammary carcinoma at both positions and in the axilla, grade 2-3, HER-2 equivocal by IHC (2+), positive by FISH (ratio 3.34), ER+ >95%, PR+ 25%, Ki67 60%.   06/29/2020 Cancer Staging   Staging form: Breast, AJCC 8th Edition - Clinical stage from 06/29/2020: Stage IB (cT2, cN1(f), cM0, G3, ER+, PR+, HER2+) - Signed by Nicholas Lose, MD on 06/29/2020    07/13/2020 - 10/26/2020 Chemotherapy      Patient is on Antibody Plan: BREAST TRASTUZUMAB + PERTUZUMAB Q21D     10/28/2020 Breast MRI   Complete imaging response   11/14/2020 Genetic Testing   Negative hereditary cancer genetic testing: no pathogenic variants detected in [facility] BRCAPlus Panel and [facility] CancerNext-Expanded +RNAinsight Panel.  The report dates are Nov 14, 2020 and December 05, 2020, respectively.    The BRCAplus panel offered by [employer] and includes sequencing and deletion/duplication analysis for the following 8 genes: ATM, BRCA1, BRCA2, CDH1, CHEK2, PALB2,  PTEN, and TP53.  The CancerNext-Expanded gene panel offered by [facility] and includes sequencing, rearrangement, and RNA analysis for the following 77 genes: AIP, ALK, APC, ATM, AXIN2, BAP1, BARD1, BLM, BMPR1A, BRCA1, BRCA2, BRIP1, CDC73, CDH1, CDK4, CDKN1B, CDKN2A, CHEK2, CTNNA1, DICER1, FANCC, FH, FLCN, GALNT12, KIF1B, LZTR1, MAX, MEN1, MET, MLH1, MSH2, MSH3, MSH6, MUTYH, NBN, NF1, NF2, NTHL1, PALB2, PHOX2B, PMS2, POT1, PRKAR1A, PTCH1, PTEN, RAD51C, RAD51D, RB1, RECQL, RET, SDHA, SDHAF2, SDHB, SDHC, SDHD, SMAD4, SMARCA4, SMARCB1, SMARCE1, STK11, SUFU, TMEM127, TP53, TSC1, TSC2, VHL and XRCC2 (sequencing and deletion/duplication); EGFR, EGLN1, HOXB13, KIT, MITF, PDGFRA, POLD1, and POLE (sequencing only); EPCAM and GREM1 (deletion/duplication only).    11/28/2020 Surgery   Right mastectomy [name]): IDC, grade 3, spanning 4.0cm fibrotic area, clear margins, 1/4 right axillary lymph nodes positive for carcinoma, 0.5cm.   Left lumpectomy: no evidence of malignancy   12/07/2020 -  Chemotherapy    Patient is on Treatment Plan: BREAST ADO-TRASTUZUMAB EMTANSINE (KADCYLA) Q21D         CHIEF COMPLIANT: Follow-up of right breast cancer and recent right mastectomy  INTERVAL HISTORY: Teresa Sheppard is a 60 y.o. with above-mentioned history of right breast cancer who had a right mastectomy and completed neoadjuvant chemotherapy and with [employer] [place], and is currently on Herceptin Perjeta maintenance. She reports to the clinic today for follow-up.  She tolerated the first treatment with Kadcyla fairly well.  Did not have any issues or concerns.  She has been going back and forth about participating in the clinical trial and wanted to discuss some questions with me.  She is accompanied today by her  husband.  ALLERGIES:  is allergic to shellfish-derived products and penicillins.  MEDICATIONS:  Current Outpatient Medications  Medication Sig Dispense Refill   acetaminophen (TYLENOL) 325 MG tablet Take  325 mg by mouth every 6 (six) hours as needed (headaches / pain).     azithromycin (ZITHROMAX) 250 MG tablet Take as directed 6 each 0   cholecalciferol (VITAMIN D3) 25 MCG (1000 UNIT) tablet Take 1,000 Units by mouth daily.     Cranberry-Vitamin C 250-60 MG CAPS Take 1 tablet by mouth in the morning and at bedtime.     lidocaine-prilocaine (EMLA) cream Apply to affected area once 30 g 3   loratadine (CLARITIN) 10 MG tablet Take 10 mg by mouth daily.     methocarbamol (ROBAXIN) 500 MG tablet Take 1 tablet (500 mg total) by mouth every 6 (six) hours as needed for muscle spasms. 15 tablet 0   Multiple Vitamins-Minerals (EMERGEN-C IMMUNE PLUS PO) Take 1 each by mouth daily.     Multiple Vitamins-Minerals (HAIR/SKIN/NAILS/BIOTIN PO) Take 1 capsule by mouth daily.     Polyethyl Glycol-Propyl Glycol (SYSTANE OP) Place 1 drop into both eyes daily as needed (dry eyes).     Probiotic Product (ALIGN) 4 MG CAPS Take 4 mg by mouth daily.     prochlorperazine (COMPAZINE) 10 MG tablet Take 10 mg by mouth every 6 (six) hours as needed for nausea or vomiting.     traMADol (ULTRAM) 50 MG tablet Take 2 tablets (100 mg total) by mouth every 6 (six) hours as needed. 10 tablet 0   urea (CARMOL) 20 % lotion Apply 1 application topically as needed.     No current facility-administered medications for this visit.    PHYSICAL EXAMINATION: ECOG PERFORMANCE STATUS: 1 - Symptomatic but completely ambulatory  Vitals:   12/12/20 0928  BP: 132/66  Pulse: 60  Resp: 18  Temp: 97.6 F (36.4 C)  SpO2: 100%   Filed Weights   12/12/20 0928  Weight: 146 lb 11.2 oz (66.5 kg)    LABORATORY DATA:  I have reviewed the data as listed CMP Latest Ref Rng & Units 12/05/2020 11/16/2020 11/08/2020  Glucose 70 - 99 mg/dL 101(H) 117(H) 89  BUN 6 - 20 mg/dL 21(H) 13 13  Creatinine 0.44 - 1.00 mg/dL 0.76 0.71 0.71  Sodium 135 - 145 mmol/L 139 141 142  Potassium 3.5 - 5.1 mmol/L 4.4 4.2 4.1  Chloride 98 - 111 mmol/L 105 108 107   CO2 22 - 32 mmol/L 29 25 27   Calcium 8.9 - 10.3 mg/dL 9.2 9.1 9.0  Total Protein 6.5 - 8.1 g/dL 7.0 6.7 6.7  Total Bilirubin 0.3 - 1.2 mg/dL <0.2(L) 0.3 0.3  Alkaline Phos 38 - 126 U/L 74 75 75  AST 15 - 41 U/L 25 30 28   ALT 0 - 44 U/L 18 21 25     Lab Results  Component Value Date   WBC 4.1 12/05/2020   HGB 10.5 (L) 12/05/2020   HCT 31.5 (L) 12/05/2020   MCV 98.7 12/05/2020   PLT 285 12/05/2020   NEUTROABS 2.2 12/05/2020    ASSESSMENT & PLAN:  Malignant neoplasm of overlapping sites of right breast in female, estrogen receptor positive ([place]) 06/21/2020:Patient palpated a right breast mass x 1 wk. Mammogram showed multiple confluent masses in the right breast at the 7 o'clock position measuring 2.3cm and at the 9 o'clock position measuring 2.2cm with one right axillary lymph node with cortical thickening. Biopsy showed invasive mammary carcinoma at both positions and  in the axilla, grade 2-3, HER-2 equivocal by IHC (2+), positive by FISH (ratio 3.34), ER+ >95%, PR+ 25%, Ki67 60%.   Treatment plan: 1. Neoadjuvant chemotherapy with [facility] Perjeta 6 cycles completed 10/26/2020 followed by Herceptin Perjeta versus [name] for 1 year 2.  Right mastectomy [name]): IDC, grade 3, spanning 4.0cm fibrotic area, clear margins, 1/4 right axillary lymph nodes positive for carcinoma, 0.5cm. Left lumpectomy: no evidence of malignancy 3. Followed by adjuvant radiation therapy 4.  Followed by adjuvant antiestrogen therapy 5.  Followed by neratinib ---------------------------------------------------------------------------------------------------------------------------------- Current Treatment: [name] COMPASS HER 2 Clinical Trial Patient is mostly agreeable to participate in the clinical trial.  She still has a few questions. I discussed with her extensively about the difference between Kadcyla and Tucatinib. We also discussed the significance of HER2 negativity on the  final pathology. There are several questions about the implications of clinical trial into future treatment decisions. We will have her meet with the clinical trials nurse today to discuss some of her concerns.  No orders of the defined types were placed in this encounter.  The patient has a good understanding of the overall plan. she agrees with it. she will call with any problems that may develop before the next visit here.  Total time spent: 45 mins including face to face time and time spent for planning, charting and coordination of care  Rulon Eisenmenger, MD, MPH 12/12/2020  I, Thana Ates, am acting as scribe for Dr. Nicholas Lose.  I have reviewed the above documentation for accuracy and completeness, and I agree with the above.

## 2020-12-11 NOTE — Assessment & Plan Note (Signed)
06/21/2020:Patient palpated a right breast mass x 1 wk. Mammogram showed multiple confluent masses in the right breast at the 7 o'clock position measuring 2.3cm and at the 9 o'clock position measuring 2.2cm with one right axillary lymph node with cortical thickening. Biopsy showed invasive mammary carcinoma at both positions and in the axilla, grade 2-3, HER-2 equivocal by IHC (2+), positive by FISH (ratio 3.34), ER+ >95%, PR+ 25%, Ki67 60%.  Treatment plan: 1. Neoadjuvant chemotherapy with [place] Perjeta 6 cyclescompleted 5/4/2022followed by HerceptinPerjeta versus Kadcylamaintenance for 1 year 2.  Right mastectomy [name]): IDC, grade 3, spanning 4.0cm fibrotic area, clear margins, 1/4 right axillary lymph nodes positive for carcinoma, 0.5cm. Left lumpectomy: no evidence of malignancy 3. Followed by adjuvant radiation therapy 4.Followed by adjuvant antiestrogen therapy 5.Followed by neratinib ---------------------------------------------------------------------------------------------------------------------------------- Current Treatment: [name] COMPASS HER 2 Clinical Trial

## 2020-12-12 ENCOUNTER — Encounter: Payer: Self-pay | Admitting: *Deleted

## 2020-12-12 ENCOUNTER — Other Ambulatory Visit: Payer: Self-pay | Admitting: *Deleted

## 2020-12-12 ENCOUNTER — Inpatient Hospital Stay (HOSPITAL_BASED_OUTPATIENT_CLINIC_OR_DEPARTMENT_OTHER): Payer: BC Managed Care – PPO | Admitting: Hematology and Oncology

## 2020-12-12 ENCOUNTER — Inpatient Hospital Stay: Payer: BC Managed Care – PPO

## 2020-12-12 ENCOUNTER — Other Ambulatory Visit: Payer: Self-pay

## 2020-12-12 DIAGNOSIS — R309 Painful micturition, unspecified: Secondary | ICD-10-CM

## 2020-12-12 DIAGNOSIS — H524 Presbyopia: Secondary | ICD-10-CM | POA: Diagnosis not present

## 2020-12-12 DIAGNOSIS — C773 Secondary and unspecified malignant neoplasm of axilla and upper limb lymph nodes: Secondary | ICD-10-CM | POA: Diagnosis not present

## 2020-12-12 DIAGNOSIS — H52213 Irregular astigmatism, bilateral: Secondary | ICD-10-CM | POA: Diagnosis not present

## 2020-12-12 DIAGNOSIS — Z170 Estrogen receptor positive status [ER+]: Secondary | ICD-10-CM

## 2020-12-12 DIAGNOSIS — C50811 Malignant neoplasm of overlapping sites of right female breast: Secondary | ICD-10-CM

## 2020-12-12 DIAGNOSIS — Z5112 Encounter for antineoplastic immunotherapy: Secondary | ICD-10-CM | POA: Diagnosis not present

## 2020-12-12 DIAGNOSIS — H5213 Myopia, bilateral: Secondary | ICD-10-CM | POA: Diagnosis not present

## 2020-12-12 LAB — URINALYSIS, COMPLETE (UACMP) WITH MICROSCOPIC
Bilirubin Urine: NEGATIVE
Glucose, UA: NEGATIVE mg/dL
Ketones, ur: NEGATIVE mg/dL
Nitrite: NEGATIVE
Protein, ur: NEGATIVE mg/dL
Specific Gravity, Urine: 1.004 — ABNORMAL LOW (ref 1.005–1.030)
WBC, UA: >50 WBC/hpf — ABNORMAL HIGH (ref 0–5)
pH: 8 (ref 5.0–8.0)

## 2020-12-12 MED ORDER — CIPROFLOXACIN HCL 500 MG PO TABS: 500.0000 mg | ORAL_TABLET | Freq: Two times a day (BID) | ORAL | 0 refills | Status: DC

## 2020-12-12 NOTE — Research (Signed)
CompassHER2 Residual Disease, A Double-Blinded, Phase III Randomized Trial of T-DM1 and Placebo Compared with T-DMI and Tucatinib:   The pt was into the cancer center this morning with her husband to meet again with [name] to discuss her treatment options.  The pt requested to meet with the research nurse.  The pt stated that she is aware that she would need to swallow 12 pills of the 50 mg dose if she is enrolled into the study.  She said that she would not be able to swallow the 150 mg dose pills due to their size.  The pt asked if the pharmacist could photograph the pills again beside a quarter for a reference.  The pt said that she doesn't want to sign the consent form if she cannot swallow the pills.  The research nurse emailed Georgiana Spinner, PharmD, to see if she would send another photo of the pills beside a quarter.  The pt's husband also had questions about insurance.  The research nurse encouraged the pt to contact her insurance company, [employer], and notify them about her consideration of this clinical trial.  The nurse told that pt and her husband to request a reference # for the call as confirmation of insurance notification.  The pt said that she also has some bladder symptoms, and she would like to check to see if she has an UTI.  The pt was told to go to the lab for a urine test. [name] also asked if the pt could be prescribed neratinib after she completes her study drug.  The nurse emailed the study to get an answer for [name].  The pt is scheduled to meet with the radiation team tomorrow.  The pt is currently scheduled to meet with the research nurse tomorrow.  The pt said that she may or may not be ready to meet with the nurse to discuss the study tomorrow.  The research nurse offered additional times this week to meet to discuss the study.  The pt was thanked for her continued interest in the study.  Brion Aliment RN, BSN, CCRP Clinical Research Nurse Lead 12/12/2020 1:12 PM

## 2020-12-13 ENCOUNTER — Encounter: Payer: Self-pay | Admitting: Radiation Oncology

## 2020-12-13 ENCOUNTER — Encounter: Payer: BC Managed Care – PPO | Admitting: *Deleted

## 2020-12-13 ENCOUNTER — Ambulatory Visit
Admission: RE | Admit: 2020-12-13 | Discharge: 2020-12-13 | Disposition: A | Discharge status: Home / Self Care | Payer: BC Managed Care – PPO | Source: Ambulatory Visit | Attending: Radiation Oncology | Admitting: Radiation Oncology

## 2020-12-13 VITALS — BP 117/78 | HR 68 | Temp 97.6°F | Resp 18 | Ht 69.5 in | Wt 146.6 lb

## 2020-12-13 DIAGNOSIS — C50811 Malignant neoplasm of overlapping sites of right female breast: Secondary | ICD-10-CM | POA: Diagnosis not present

## 2020-12-13 DIAGNOSIS — Z170 Estrogen receptor positive status [ER+]: Secondary | ICD-10-CM

## 2020-12-13 NOTE — Telephone Encounter (Signed)
Lm on vm for patient to return call 

## 2020-12-13 NOTE — Progress Notes (Signed)
Radiation Oncology         [phone] ________________________________  Name: Teresa Sheppard        MRN: [id number]  Date of Service: 12/13/2020 DOB: 21-Oct-1960  MW:NUUVOZ, Denton Ar, MD  Nicholas Lose, MD     REFERRING PHYSICIAN: Nicholas Lose, MD   DIAGNOSIS: The encounter diagnosis was Malignant neoplasm of overlapping sites of right breast in female, estrogen receptor positive ([place]).   HISTORY OF PRESENT ILLNESS: Teresa Sheppard is a 60 y.o. female originally seen in the multidisciplinary breast clinic for a new diagnosis of right breast cancer. The patient was noted to have a palpable mass in the right breast for approximately a week, she has a strong family history of breast cancer, and was seen for diagnostic work-up.  This revealed confluent masses within the breast, by ultrasound at 7:00 a confluent group of disease was noted to measure 2.3 cm and another separate area of confluence was seen at 9 o'clock position measuring 2.2 cm.  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    Since her last visit, she received neoadjuvant chemotherapy beginning on 07/13/2020, she completed this on 10/26/2020.  She subsequently underwent right mastectomy with right with sentinel lymph node biopsy  and left excisional biopsy on 11/28/2020   Her right breast specimen revealed a grade 3 invasive ductal carcinoma spanning up to 4 cm with fibrotic change resection margins were negative and 1 of 4 lymph nodes were positive for carcinoma, and additional excision labeled as a lymph node in the right axilla was benign fibroconnective tissue. Her left breast was negative for malignancy. Given her residual disease she began Kadcyla on 12/07/2020, she is also considering clinical trial enrollment.  She is seen today to  review the rationale for adjuvant radiotherapy to the right chest wall and regional lymph nodes.    PREVIOUS RADIATION THERAPY: No   PAST MEDICAL HISTORY:  Past Medical History:  Diagnosis Date   Anxiety    Breast cancer in female [facility])    Right   Family history of breast cancer 11/07/2020   Family history of pancreatic cancer 11/07/2020   History of kidney stones 2014-2015   Hyperthyroidism    No longer an issue   PONV (postoperative nausea and vomiting)    Thyromegaly        PAST SURGICAL HISTORY: Past Surgical History:  Procedure Laterality Date   ABDOMINAL WALL DEFECT REPAIR     following hernia repair in 2010   [place]     2008  while pregnant with last child. Scar tissue from appendectomy wrapped around bowel and was snipped to free bowel.    CESAREAN SECTION     DILATION AND CURETTAGE OF UTERUS     2000 and 2014   HERNIA REPAIR     MASTECTOMY W/ SENTINEL NODE BIOPSY Right 11/28/2020   Procedure: RIGHT MASTECTOMY WITH RIGHT AXILLARY SENTINEL LYMPH NODE BIOPSY;  Surgeon: [name], MD;  Location: [place];  Service: General;  Laterality: Right;   PORTACATH PLACEMENT Left 07/12/2020   Procedure: INSERTION PORT-A-CATH WITH ULTRASOUND GUIDANCE;  Surgeon: [name], MD;  Location: [place];  Service: General;  Laterality: Left;   RADIOACTIVE SEED GUIDED AXILLARY SENTINEL LYMPH NODE Right 11/28/2020   Procedure: RADIOACTIVE  SEED GUIDED RIGHT AXILLARY AXILLARY SENTINEL LYMPH NODE EXCISION;  Surgeon: [name], MD;  Location: [place];  Service: General;  Laterality: Right;   RADIOACTIVE SEED GUIDED EXCISIONAL BREAST BIOPSY Left 11/28/2020   Procedure: RADIOACTIVE SEED GUIDED LEFT EXCISIONAL BREAST BIOPSY;  Surgeon: [name], MD;  Location: [place];  Service: General;  Laterality: Left;   ROOT CANAL     2019   TONSILLECTOMY  1969   URETERAL EXPLORATION     dilataion for congenitally small ureter     FAMILY HISTORY:  Family  History  Problem Relation Age of Onset   Breast cancer Mother 68       metastatic   Breast cancer Sister 24   Breast cancer Maternal Aunt        dx 80s   Pancreatic cancer Father 50   Pancreatic cancer Paternal Grandfather        dx 48s   Breast cancer Sister 24   Leukemia Nephew 62   Cancer Maternal Aunt        unknown type; ?bone; dx late 2s     SOCIAL HISTORY:  reports that she has never smoked. She has never used smokeless tobacco. She reports current alcohol use. She reports that she does not use drugs. The patient is married and lives in [place]. 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   ALLERGIES: Shellfish-derived products and Penicillins   MEDICATIONS:  Current Outpatient Medications  Medication Sig Dispense Refill   acetaminophen (TYLENOL) 325 MG tablet Take 325 mg by mouth every 6 (six) hours as needed (headaches / pain).     azithromycin (ZITHROMAX) 250 MG tablet Take as directed 6 each 0   cholecalciferol (VITAMIN D3) 25 MCG (1000 UNIT) tablet Take 1,000 Units by mouth daily.     ciprofloxacin (CIPRO) 500 MG tablet Take 1 tablet (500 mg total) by mouth 2 (two) times daily. For 7 days 14 tablet 0   Cranberry-Vitamin C 250-60 MG CAPS Take 1 tablet by mouth in the morning and at bedtime.     lidocaine-prilocaine (EMLA) cream Apply to affected area once 30 g 3   loratadine (CLARITIN) 10 MG tablet Take 10 mg by mouth daily.     methocarbamol (ROBAXIN) 500 MG tablet Take 1 tablet (500 mg total) by mouth every 6 (six) hours as needed for muscle spasms. 15 tablet 0   Multiple Vitamins-Minerals (EMERGEN-C IMMUNE PLUS PO) Take 1 each by mouth daily.     Multiple Vitamins-Minerals (HAIR/SKIN/NAILS/BIOTIN PO) Take 1 capsule by mouth daily.     Polyethyl Glycol-Propyl Glycol (SYSTANE OP) Place 1 drop into both eyes daily as needed (dry eyes).     Probiotic Product (ALIGN) 4 MG  CAPS Take 4 mg by mouth daily.     prochlorperazine (COMPAZINE) 10 MG tablet Take 10 mg by mouth every 6 (six) hours as needed for nausea or vomiting.     traMADol (ULTRAM) 50 MG tablet Take 2 tablets (100 mg total) by mouth every 6 (six) hours as needed. 10 tablet 0   urea (CARMOL) 20 % lotion Apply 1 application topically as needed.     No current facility-administered medications for this encounter.     REVIEW OF SYSTEMS: On review of systems, the patient is doing well per report. She is hoping to have her right JP drain removed tomorrow. She feels that she's doing well  with range of motion, and healing overall. No other complaints were verbalized.     PHYSICAL EXAM:  Wt Readings from Last 3 Encounters:  12/12/20 146 lb 11.2 oz (66.5 kg)  12/05/20 148 lb 6.4 oz (67.3 kg)  11/28/20 148 lb 2.4 oz (67.2 kg)   Temp Readings from Last 3 Encounters:  12/12/20 97.6 F (36.4 C) (Temporal)  12/07/20 97.9 F (36.6 C) (Oral)  12/05/20 97.7 F (36.5 C) (Tympanic)   BP Readings from Last 3 Encounters:  12/12/20 132/66  12/07/20 114/65  12/05/20 (!) 113/58   Pulse Readings from Last 3 Encounters:  12/12/20 60  12/07/20 71  12/05/20 63    In general this is a well appearing caucasian female in no acute distress. She's alert and oriented x4 and appropriate throughout the examination. Cardiopulmonary assessment is negative for acute distress and she exhibits normal effort. The right mastectomy site is undisturbed but the patient shows me her JP drain which has serous fluid in the bulb.     ECOG = 0  0 - Asymptomatic (Fully active, able to carry on all predisease activities without restriction)  1 - Symptomatic but completely ambulatory (Restricted in physically strenuous activity but ambulatory and able to carry out work of a light or sedentary nature. For example, light housework, office work)  2 - Symptomatic, <50% in bed during the day (Ambulatory and capable of all self care but  unable to carry out any work activities. Up and about more than 50% of waking hours)  3 - Symptomatic, >50% in bed, but not bedbound (Capable of only limited self-care, confined to bed or chair 50% or more of waking hours)  4 - Bedbound (Completely disabled. Cannot carry on any self-care. Totally confined to bed or chair)  5 - Death   Eustace Pen MM, Creech RH, Tormey DC, et al. [phone]). "Toxicity and response criteria of the [facility]". [place] Oncol. 5 (6): 649-55    LABORATORY DATA:  Lab Results  Component Value Date   WBC 4.1 12/05/2020   HGB 10.5 (L) 12/05/2020   HCT 31.5 (L) 12/05/2020   MCV 98.7 12/05/2020   PLT 285 12/05/2020   Lab Results  Component Value Date   NA 139 12/05/2020   K 4.4 12/05/2020   CL 105 12/05/2020   CO2 29 12/05/2020   Lab Results  Component Value Date   ALT 18 12/05/2020   AST 25 12/05/2020   ALKPHOS 74 12/05/2020   BILITOT <0.2 (L) 12/05/2020      RADIOGRAPHY: NM Sentinel Node Inj-No Rpt (Breast)  Result Date: 11/28/2020 Sulfur Colloid was injected by the Nuclear Medicine Technologist for sentinel lymph node localization.   MM Breast Surgical Specimen  Result Date: 11/28/2020 CLINICAL DATA:  Evaluate surgical specimen following radioactive seed localization and excision of a right breast lesion. EXAM: SPECIMEN RADIOGRAPH OF THE RIGHT BREAST COMPARISON:  Previous exam(s). FINDINGS: Status post excision of the right breast. The radioactive seed and biopsy marker clip are present, completely intact, and were marked for pathology. IMPRESSION: Specimen radiograph of the right breast. Electronically Signed   By: Lajean Manes M.D.   On: 11/28/2020 11:49  MM Breast Surgical Specimen  Result Date: 11/28/2020 CLINICAL DATA:  Evaluate post surgical specimen after excision of a left breast lesion following radioactive seed localization. EXAM: SPECIMEN RADIOGRAPH OF THE LEFT BREAST COMPARISON:  Previous exam(s). FINDINGS: Status  post excision of the left breast. The radioactive seed and biopsy marker clip are  present, completely intact, and were marked for pathology. IMPRESSION: Specimen radiograph of the left breast. Electronically Signed   By: Lajean Manes M.D.   On: 11/28/2020 11:18   MM LT RADIOACTIVE SEED LOC MAMMO GUIDE  Result Date: 11/25/2020 CLINICAL DATA:  60 year old with multicentric RIGHT breast cancer metastatic to a RIGHT axillary lymph node for which the patient underwent neoadjuvant chemotherapy with a complete imaging response to treatment. She is planning on having RIGHT mastectomy and targeted RIGHT axillary node removal and sentinel node biopsy. She also has a biopsy proven possible papillary lesion involving the LOWER subareolar LEFT breast for which she is having an excisional biopsy. Radioactive seed localization is performed in anticipation of her surgeries which are scheduled on 11/28/2020. EXAM: MAMMOGRAPHIC GUIDED RADIOACTIVE SEED LOCALIZATION OF THE LEFT BREAST COMPARISON:  Previous exam(s). FINDINGS: Patient presents for radioactive seed localization prior to LEFT breast excisional biopsy. I met with the patient and we discussed the procedure of seed localization including benefits and alternatives. We discussed the high likelihood of a successful procedure. We discussed the risks of the procedure including infection, bleeding, tissue injury and further surgery. We discussed the low dose of radioactivity involved in the procedure. Informed, written consent was given. The usual time-out protocol was performed immediately prior to the procedure. Using mammographic guidance, sterile technique with chlorhexidine as skin antisepsis, 1% lidocaine as local anesthetic, an I-125 radioactive seed was used to localize the dumbbell shaped tissue marker clip associated with the biopsy-proven possible papillary lesion using a lateral approach. The follow-up mammogram images confirm that the seed is positioned adjacent to  the clip, located 4 mm inferomedial to the clip. The images are were marked for Dr. [name]. Follow-up survey of the patient confirms the presence of the radioactive seed. Order number of I-125 seed: [id number] Total activity: 0.257 mCi Reference Date: 11/11/2020 The patient tolerated the procedure well and was released from the [facility]. She was given instructions regarding seed removal. IMPRESSION: Radioactive seed localization of a possible papillary lesion involving the LEFT breast breast. No apparent complications. Electronically Signed   By: Evangeline Dakin M.D.   On: 11/25/2020 16:29   [place] RT RADIOACTIVE SEED LOC  Result Date: 11/25/2020 CLINICAL DATA:  60 year old with multicentric RIGHT breast cancer metastatic to a RIGHT axillary lymph node for which the patient underwent neoadjuvant chemotherapy with a complete imaging response to treatment. She is planning on having RIGHT mastectomy and targeted RIGHT axillary node removal and sentinel node biopsy. Radioactive seed localization of the biopsy-proven metastatic lymph node is performed in anticipation of her surgeries which are scheduled for 11/28/2020. She also has a possible papillary lesion in the LOWER subareolar LEFT breast for which radioactive seed localization was performed earlier today, reported separately. EXAM: ULTRASOUND GUIDED RADIOACTIVE SEED LOCALIZATION OF THE RIGHT AXILLA DIAGNOSTIC RIGHT MAMMOGRAM TO CONFIRM SEED PLACEMENT COMPARISON:  Previous exam(s). FINDINGS: Patient presents for radioactive seed localization prior to targeted RIGHT axillary resection. I met with the patient and we discussed the procedure of seed localization including benefits and alternatives. We discussed the high likelihood of a successful procedure. We discussed the risks of the procedure including infection, bleeding, tissue injury and further surgery. We discussed the low dose of radioactivity involved in the procedure. Informed, written consent was  given. The usual time-out protocol was performed immediately prior to the procedure. Using ultrasound guidance, sterile technique with chlorhexidine as skin antisepsis, 1% lidocaine as local anesthetic, an I-125 radioactive seed was used to localize the Venus clip  associated with the previous biopsy-proven RIGHT axillary lymph node using an inferolateral approach. The follow-up diagnostic MLO mammogram image shows that the seed is in in a lymph node immediately adjacent to the biopsied lymph node, the seed being approximately 1.2 cm inferior to the Venus clip placed at the time of biopsy. The image is marked for Dr. [name]. Follow-up survey of the patient confirms presence of the radioactive seed. Order number of I-125 seed: [id number] Total activity:  0.257 mCi Reference Date: 11/11/2020 The patient tolerated the procedure well and was released from the [facility]. She was given instructions regarding seed removal. IMPRESSION: Radioactive seed localization of a RIGHT axillary lymph node which is immediately adjacent to the previously biopsied lymph node which was not visible sonographically. The seed is approximately 1.2 cm inferior to the Venus clip placed at the time of biopsy. No apparent complications. Electronically Signed   By: Evangeline Dakin M.D.   On: 11/25/2020 16:34   MM CLIP PLACEMENT RIGHT  Result Date: 11/25/2020 CLINICAL DATA:  60 year old with multicentric RIGHT breast cancer metastatic to a RIGHT axillary lymph node for which the patient underwent neoadjuvant chemotherapy with a complete imaging response to treatment. She is planning on having RIGHT mastectomy and targeted RIGHT axillary node removal and sentinel node biopsy. Radioactive seed localization of the biopsy-proven metastatic lymph node is performed in anticipation of her surgeries which are scheduled for 11/28/2020. She also has a possible papillary lesion in the LOWER subareolar LEFT breast for which radioactive seed  localization was performed earlier today, reported separately. EXAM: ULTRASOUND GUIDED RADIOACTIVE SEED LOCALIZATION OF THE RIGHT AXILLA DIAGNOSTIC RIGHT MAMMOGRAM TO CONFIRM SEED PLACEMENT COMPARISON:  Previous exam(s). FINDINGS: Patient presents for radioactive seed localization prior to targeted RIGHT axillary resection. I met with the patient and we discussed the procedure of seed localization including benefits and alternatives. We discussed the high likelihood of a successful procedure. We discussed the risks of the procedure including infection, bleeding, tissue injury and further surgery. We discussed the low dose of radioactivity involved in the procedure. Informed, written consent was given. The usual time-out protocol was performed immediately prior to the procedure. Using ultrasound guidance, sterile technique with chlorhexidine as skin antisepsis, 1% lidocaine as local anesthetic, an I-125 radioactive seed was used to localize the Venus clip associated with the previous biopsy-proven RIGHT axillary lymph node using an inferolateral approach. The follow-up diagnostic MLO mammogram image shows that the seed is in in a lymph node immediately adjacent to the biopsied lymph node, the seed being approximately 1.2 cm inferior to the Venus clip placed at the time of biopsy. The image is marked for Dr. [name]. Follow-up survey of the patient confirms presence of the radioactive seed. Order number of I-125 seed: [id number] Total activity:  0.257 mCi Reference Date: 11/11/2020 The patient tolerated the procedure well and was released from the [facility]. She was given instructions regarding seed removal. IMPRESSION: Radioactive seed localization of a RIGHT axillary lymph node which is immediately adjacent to the previously biopsied lymph node which was not visible sonographically. The seed is approximately 1.2 cm inferior to the Venus clip placed at the time of biopsy. No apparent complications.  Electronically Signed   By: Evangeline Dakin M.D.   On: 11/25/2020 16:34        IMPRESSION/PLAN: 1. 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 Dr. Lisbeth Renshaw discusses the final pathology findings and reviews the nature of right node positive breast disease.  He recommends external radiotherapy to the right chest wall and regional nodes  to reduce risks of local recurrence followed by antiestrogen therapy. She is also receiving Kadcyla but is contemplating clinical trial. We discussed the risks, benefits, short, and long term effects of radiotherapy, as well as the curative intent, and the patient is interested in proceeding. Dr. Lisbeth Renshaw discusses the delivery and logistics of radiotherapy and anticipates a course of 6 1/2 weeks of radiotherapy to the right chest wall and regional nodes. Written consent is obtained and placed in the chart, a copy was provided to the patient. She will simulate on 12/27/20.     In a visit lasting 90 minutes, greater than 50% of the time was spent face to face reviewing her case, as well as in preparation of, discussing, and coordinating the patient's care.  The above documentation reflects my direct findings during this shared patient visit. Please see the separate note by Dr. Lisbeth Renshaw on this date for the remainder of the patient's plan of care.    Carola Rhine, PAC

## 2020-12-14 ENCOUNTER — Encounter: Payer: Self-pay | Admitting: Hematology and Oncology

## 2020-12-14 ENCOUNTER — Telehealth: Payer: Self-pay | Admitting: *Deleted

## 2020-12-14 DIAGNOSIS — R300 Dysuria: Secondary | ICD-10-CM | POA: Diagnosis not present

## 2020-12-14 LAB — URINE CULTURE: Culture: 20000 — AB

## 2020-12-14 NOTE — Telephone Encounter (Signed)
CompassHER2 Residual Disease, A Double-Blinded, Phase III Randomized Trial of T-DM1 and Placebo Compared with T-DMI and Tucatinib:   The pt emailed the research nurse this morning, and the research nurse called the pt to discuss her study participation. The pt was unable to meet with the nurse yesterday due to her radiation appt lasted longer than planned.  The research nurse gave the radiation team the protocol pages that refer to adjuvant radiation for their reference.  The team thanked the nurse for providing this information.  Shona Simpson, PA said that the radiation team would follow these guidelines if the pt enrolls on the study. The nurse informed the pt that the study could not take a photo with the pills next to a quarter because of sterility issues.  The pharmacist stated that the pill bottle cap is the diameter of a size of a quarter. The pt verbalized understanding.  The pt also was told that [name] can prescribe adjuvant neratinib after her study treatment is complete if he feels that it is in her best interest.  The pt was told that the nurse is available to meet with her to go over the consent this week or the beginning of next week. The pt said that she really wanted to read over the consent again and then she would make an appt to discuss her study participation and possibly sign the consent form.  The pt thanked the nurse for answering all of her study questions.  The pt was also told that the study allows up to 5 weeks from the pt's most recent T-DM1 to enroll on the study.  [name] also said that he was okay with delaying her next T-DM1 to allow her extra time to consider participation. The pt said that she appreciates having some more time to make her decision because the pt mentioned many ongoing stressors in her life right now.  The pt will await the pt's return call.  Brion Aliment RN, BSN, CCRP Clinical Research Nurse Lead 12/14/2020 11:40 AM

## 2020-12-14 NOTE — Telephone Encounter (Signed)
Patient returned call, she requested an appt for next year. Recall in epic for a new patient appt in June.

## 2020-12-16 ENCOUNTER — Encounter: Payer: Self-pay | Admitting: Physical Therapy

## 2020-12-16 ENCOUNTER — Encounter: Payer: Self-pay | Admitting: *Deleted

## 2020-12-27 ENCOUNTER — Ambulatory Visit: Payer: BC Managed Care – PPO | Admitting: Radiation Oncology

## 2020-12-27 ENCOUNTER — Ambulatory Visit
Admission: RE | Admit: 2020-12-27 | Discharge: 2020-12-27 | Disposition: A | Discharge status: Home / Self Care | Payer: BC Managed Care – PPO | Source: Ambulatory Visit | Attending: Radiation Oncology | Admitting: Radiation Oncology

## 2020-12-27 ENCOUNTER — Ambulatory Visit: Payer: BC Managed Care – PPO

## 2020-12-27 ENCOUNTER — Other Ambulatory Visit: Payer: Self-pay

## 2020-12-27 DIAGNOSIS — Z510 Encounter for antineoplastic radiation therapy: Secondary | ICD-10-CM | POA: Diagnosis not present

## 2020-12-27 DIAGNOSIS — C50811 Malignant neoplasm of overlapping sites of right female breast: Secondary | ICD-10-CM | POA: Insufficient documentation

## 2020-12-27 NOTE — Assessment & Plan Note (Signed)
06/21/2020:Patient palpated a right breast mass x 1 wk. Mammogram showed multiple confluent masses in the right breast at the 7 o'clock position measuring 2.3cm and at the 9 o'clock position measuring 2.2cm with one right axillary lymph node with cortical thickening. Biopsy showed invasive mammary carcinoma at both positions and in the axilla, grade 2-3, HER-2 equivocal by IHC (2+), positive by FISH (ratio 3.34), ER+ >95%, PR+ 25%, Ki67 60%.  Treatment plan: 1. Neoadjuvant chemotherapy with [place] Perjeta 6 cyclescompleted 5/4/2022followed by HerceptinPerjeta versus Kadcylamaintenance for 1 year 2.Right mastectomy Teresa Sheppard): IDC, grade 3, spanning 4.0cm fibrotic area, clear margins, 1/4 right axillary lymph nodes positive for carcinoma, 0.5cm. Left lumpectomy: no evidence of malignancy 3. Followed by adjuvant radiation therapy 4.Followed by adjuvant antiestrogen therapy 5.Followed by neratinib ---------------------------------------------------------------------------------------------------------------------------------- Current Treatment: [name]  RTC in 3 weeks

## 2020-12-27 NOTE — Progress Notes (Signed)
Patient Care Team: Wenda Low, MD as PCP - General (Internal Medicine) Rockwell Germany, RN as Oncology Nurse Navigator Mauro Kaufmann, RN as Oncology Nurse Navigator [name], MD as Consulting Physician (General Surgery) Nicholas Lose, MD as Consulting Physician (Hematology and Oncology) Kyung Rudd, MD as Consulting Physician (Radiation Oncology)  DIAGNOSIS:    ICD-10-CM   1. Malignant neoplasm of overlapping sites of right breast in female, estrogen receptor positive ([place])  C50.811    Z17.0       SUMMARY OF ONCOLOGIC HISTORY: Oncology History  Malignant neoplasm of overlapping sites of right breast in female, estrogen receptor positive ([place])  06/21/2020 Initial Diagnosis   Patient palpated a right breast mass x 1 wk. Mammogram showed multiple confluent masses in the right breast at the 7 o'clock position measuring 2.3cm and at the 9 o'clock position measuring 2.2cm with one right axillary lymph node with cortical thickening. Biopsy showed invasive mammary carcinoma at both positions and in the axilla, grade 2-3, HER-2 equivocal by IHC (2+), positive by FISH (ratio 3.34), ER+ >95%, PR+ 25%, Ki67 60%.   06/29/2020 Cancer Staging   Staging form: Breast, AJCC 8th Edition - Clinical stage from 06/29/2020: Stage IB (cT2, cN1(f), cM0, G3, ER+, PR+, HER2+) - Signed by Nicholas Lose, MD on 06/29/2020    07/13/2020 - 10/26/2020 Chemotherapy      Patient is on Antibody Plan: BREAST TRASTUZUMAB + PERTUZUMAB Q21D     10/28/2020 Breast MRI   Complete imaging response   11/14/2020 Genetic Testing   Negative hereditary cancer genetic testing: no pathogenic variants detected in [facility] BRCAPlus Panel and [facility] CancerNext-Expanded +RNAinsight Panel.  The report dates are Nov 14, 2020 and December 05, 2020, respectively.    The BRCAplus panel offered by [employer] and includes sequencing and deletion/duplication analysis for the following 8 genes: ATM, BRCA1, BRCA2, CDH1, CHEK2, PALB2,  PTEN, and TP53.  The CancerNext-Expanded gene panel offered by [facility] and includes sequencing, rearrangement, and RNA analysis for the following 77 genes: AIP, ALK, APC, ATM, AXIN2, BAP1, BARD1, BLM, BMPR1A, BRCA1, BRCA2, BRIP1, CDC73, CDH1, CDK4, CDKN1B, CDKN2A, CHEK2, CTNNA1, DICER1, FANCC, FH, FLCN, GALNT12, KIF1B, LZTR1, MAX, MEN1, MET, MLH1, MSH2, MSH3, MSH6, MUTYH, NBN, NF1, NF2, NTHL1, PALB2, PHOX2B, PMS2, POT1, PRKAR1A, PTCH1, PTEN, RAD51C, RAD51D, RB1, RECQL, RET, SDHA, SDHAF2, SDHB, SDHC, SDHD, SMAD4, SMARCA4, SMARCB1, SMARCE1, STK11, SUFU, TMEM127, TP53, TSC1, TSC2, VHL and XRCC2 (sequencing and deletion/duplication); EGFR, EGLN1, HOXB13, KIT, MITF, PDGFRA, POLD1, and POLE (sequencing only); EPCAM and GREM1 (deletion/duplication only).    11/28/2020 Surgery   Right mastectomy Teresa Sheppard): IDC, grade 3, spanning 4.0cm fibrotic area, clear margins, 1/4 right axillary lymph nodes positive for carcinoma, 0.5cm.   Left lumpectomy: no evidence of malignancy   12/07/2020 -  Chemotherapy    Patient is on Treatment Plan: BREAST ADO-TRASTUZUMAB EMTANSINE (KADCYLA) Q21D         CHIEF COMPLIANT: [name]  INTERVAL HISTORY: Teresa Sheppard is a 60 y.o. with above-mentioned history of right breast cancer who had a right mastectomy and completed neoadjuvant chemotherapy and with [place], and is currently on [name]. She reports to the clinic today for treatment.  She has not had any major problems tolerating Kadcyla.  She is curious whether or not she can get start with antiestrogen therapy.  She is all set to start radiation.  ALLERGIES:  is allergic to shellfish-derived products and penicillins.  MEDICATIONS:  Current Outpatient Medications  Medication Sig Dispense Refill  letrozole (FEMARA) 2.5 MG tablet Take 1 tablet (2.5 mg total) by mouth daily. 90 tablet 3   acetaminophen (TYLENOL) 325 MG tablet Take 325 mg by mouth every 6 (six) hours as needed (headaches  / pain).     cholecalciferol (VITAMIN D3) 25 MCG (1000 UNIT) tablet Take 1,000 Units by mouth daily.     ciprofloxacin (CIPRO) 500 MG tablet Take 1 tablet (500 mg total) by mouth 2 (two) times daily. For 7 days (Patient not taking: Reported on 12/13/2020) 14 tablet 0   Cranberry-Vitamin C 250-60 MG CAPS Take 1 tablet by mouth in the morning and at bedtime.     lidocaine-prilocaine (EMLA) cream Apply to affected area once 30 g 3   loratadine (CLARITIN) 10 MG tablet Take 10 mg by mouth daily.     methocarbamol (ROBAXIN) 500 MG tablet Take 1 tablet (500 mg total) by mouth every 6 (six) hours as needed for muscle spasms. (Patient not taking: Reported on 12/13/2020) 15 tablet 0   Multiple Vitamins-Minerals (EMERGEN-C IMMUNE PLUS PO) Take 1 each by mouth daily.     Multiple Vitamins-Minerals (HAIR/SKIN/NAILS/BIOTIN PO) Take 1 capsule by mouth daily.     Polyethyl Glycol-Propyl Glycol (SYSTANE OP) Place 1 drop into both eyes daily as needed (dry eyes).     Probiotic Product (ALIGN) 4 MG CAPS Take 4 mg by mouth daily.     prochlorperazine (COMPAZINE) 10 MG tablet Take 10 mg by mouth every 6 (six) hours as needed for nausea or vomiting.     urea (CARMOL) 20 % lotion Apply 1 application topically as needed. (Patient not taking: Reported on 12/13/2020)     No current facility-administered medications for this visit.    PHYSICAL EXAMINATION: ECOG PERFORMANCE STATUS: 1 - Symptomatic but completely ambulatory  Vitals:   12/28/20 1209 12/28/20 1210  BP: (!) 143/67 (!) 120/59  Pulse: 64   Resp: 18   Temp: 97.7 F (36.5 C)   SpO2: 100%    Filed Weights   12/28/20 1209  Weight: 147 lb 11.2 oz (67 kg)    LABORATORY DATA:  I have reviewed the data as listed CMP Latest Ref Rng & Units 12/05/2020 11/16/2020 11/08/2020  Glucose 70 - 99 mg/dL 101(H) 117(H) 89  BUN 6 - 20 mg/dL 21(H) 13 13  Creatinine 0.44 - 1.00 mg/dL 0.76 0.71 0.71  Sodium 135 - 145 mmol/L 139 141 142  Potassium 3.5 - 5.1 mmol/L 4.4 4.2  4.1  Chloride 98 - 111 mmol/L 105 108 107  CO2 22 - 32 mmol/L 29 25 27   Calcium 8.9 - 10.3 mg/dL 9.2 9.1 9.0  Total Protein 6.5 - 8.1 g/dL 7.0 6.7 6.7  Total Bilirubin 0.3 - 1.2 mg/dL <0.2(L) 0.3 0.3  Alkaline Phos 38 - 126 U/L 74 75 75  AST 15 - 41 U/L 25 30 28   ALT 0 - 44 U/L 18 21 25     Lab Results  Component Value Date   WBC 4.1 12/05/2020   HGB 10.5 (L) 12/05/2020   HCT 31.5 (L) 12/05/2020   MCV 98.7 12/05/2020   PLT 285 12/05/2020   NEUTROABS 2.2 12/05/2020    ASSESSMENT & PLAN:  Malignant neoplasm of overlapping sites of right breast in female, estrogen receptor positive ([place]) 06/21/2020:Patient palpated a right breast mass x 1 wk. Mammogram showed multiple confluent masses in the right breast at the 7 o'clock position measuring 2.3cm and at the 9 o'clock position measuring 2.2cm with one right axillary lymph node with cortical  thickening. Biopsy showed invasive mammary carcinoma at both positions and in the axilla, grade 2-3, HER-2 equivocal by IHC (2+), positive by FISH (ratio 3.34), ER+ >95%, PR+ 25%, Ki67 60%.   Treatment plan: 1. Neoadjuvant chemotherapy with TCH Perjeta 6 cycles completed 10/26/2020 followed by Herceptin Perjeta versus [name] for 1 year 2.  Right mastectomy Teresa Sheppard): IDC, grade 3, spanning 4.0cm fibrotic area, clear margins, 1/4 right axillary lymph nodes positive for carcinoma, 0.5cm. Left lumpectomy: no evidence of malignancy 3. Followed by adjuvant radiation therapy 4.  Followed by adjuvant antiestrogen therapy started 12/29/2020 5.  Followed by neratinib ---------------------------------------------------------------------------------------------------------------------------------- Current Treatment: [name] Denies any toxicities to Kadcyla. We discussed pros and cons of antiestrogen therapy with letrozole. I sent a prescription to her pharmacy today.  We decided not to pursue clinical trial Compass HER2 trial. RTC in 3  weeks    No orders of the defined types were placed in this encounter.  The patient has a good understanding of the overall plan. she agrees with it. she will call with any problems that may develop before the next visit here.  Total time spent: 30 mins including face to face time and time spent for planning, charting and coordination of care  Rulon Eisenmenger, MD, MPH 12/28/2020  I, Thana Ates, am acting as scribe for Dr. Nicholas Lose.  I have reviewed the above documentation for accuracy and completeness, and I agree with the above.

## 2020-12-28 ENCOUNTER — Other Ambulatory Visit: Payer: BC Managed Care – PPO

## 2020-12-28 ENCOUNTER — Inpatient Hospital Stay: Payer: BC Managed Care – PPO

## 2020-12-28 ENCOUNTER — Inpatient Hospital Stay (HOSPITAL_BASED_OUTPATIENT_CLINIC_OR_DEPARTMENT_OTHER): Payer: BC Managed Care – PPO | Admitting: Hematology and Oncology

## 2020-12-28 ENCOUNTER — Ambulatory Visit: Payer: BC Managed Care – PPO

## 2020-12-28 ENCOUNTER — Inpatient Hospital Stay: Payer: BC Managed Care – PPO | Attending: Hematology and Oncology

## 2020-12-28 DIAGNOSIS — C50811 Malignant neoplasm of overlapping sites of right female breast: Secondary | ICD-10-CM

## 2020-12-28 DIAGNOSIS — Z170 Estrogen receptor positive status [ER+]: Secondary | ICD-10-CM | POA: Insufficient documentation

## 2020-12-28 DIAGNOSIS — Z5112 Encounter for antineoplastic immunotherapy: Secondary | ICD-10-CM | POA: Insufficient documentation

## 2020-12-28 LAB — CBC WITH DIFFERENTIAL/PLATELET
Abs Immature Granulocytes: 0.01 10*3/uL (ref 0.00–0.07)
Basophils Absolute: 0.1 10*3/uL (ref 0.0–0.1)
Basophils Relative: 1 %
Eosinophils Absolute: 0.3 10*3/uL (ref 0.0–0.5)
Eosinophils Relative: 7 %
HCT: 34.7 % — ABNORMAL LOW (ref 36.0–46.0)
Hemoglobin: 11.7 g/dL — ABNORMAL LOW (ref 12.0–15.0)
Immature Granulocytes: 0 %
Lymphocytes Relative: 27 %
Lymphs Abs: 1.3 10*3/uL (ref 0.7–4.0)
MCH: 32.5 pg (ref 26.0–34.0)
MCHC: 33.7 g/dL (ref 30.0–36.0)
MCV: 96.4 fL (ref 80.0–100.0)
Monocytes Absolute: 0.4 10*3/uL (ref 0.1–1.0)
Monocytes Relative: 9 %
Neutro Abs: 2.6 10*3/uL (ref 1.7–7.7)
Neutrophils Relative %: 56 %
Platelets: 187 10*3/uL (ref 150–400)
RBC: 3.6 MIL/uL — ABNORMAL LOW (ref 3.87–5.11)
RDW: 12.3 % (ref 11.5–15.5)
WBC: 4.7 10*3/uL (ref 4.0–10.5)
nRBC: 0 % (ref 0.0–0.2)

## 2020-12-28 LAB — COMPREHENSIVE METABOLIC PANEL
ALT: 42 U/L (ref 0–44)
AST: 50 U/L — ABNORMAL HIGH (ref 15–41)
Albumin: 3.8 g/dL (ref 3.5–5.0)
Alkaline Phosphatase: 74 U/L (ref 38–126)
Anion gap: 8 (ref 5–15)
BUN: 17 mg/dL (ref 6–20)
CO2: 27 mmol/L (ref 22–32)
Calcium: 9.6 mg/dL (ref 8.9–10.3)
Chloride: 106 mmol/L (ref 98–111)
Creatinine, Ser: 0.71 mg/dL (ref 0.44–1.00)
GFR, Estimated: >60 mL/min (ref >60)
Glucose, Bld: 99 mg/dL (ref 70–99)
Potassium: 3.7 mmol/L (ref 3.5–5.1)
Sodium: 141 mmol/L (ref 135–145)
Total Bilirubin: 0.3 mg/dL (ref 0.3–1.2)
Total Protein: 7.5 g/dL (ref 6.5–8.1)

## 2020-12-28 MED ORDER — DIPHENHYDRAMINE HCL 25 MG PO CAPS
ORAL_CAPSULE | ORAL | Status: AC
  Filled 2020-12-28: qty 2

## 2020-12-28 MED ORDER — DIPHENHYDRAMINE HCL 25 MG PO CAPS
50.0000 mg | ORAL_CAPSULE | Freq: Once | ORAL | Status: AC
  Administered 2020-12-28: 50 mg via ORAL

## 2020-12-28 MED ORDER — SODIUM CHLORIDE 0.9 % IV SOLN
Freq: Once | INTRAVENOUS | Status: AC
  Filled 2020-12-28: qty 250

## 2020-12-28 MED ORDER — SODIUM CHLORIDE 0.9 % IV SOLN
3.8000 mg/kg | Freq: Once | INTRAVENOUS | Status: AC
  Administered 2020-12-28: 260 mg via INTRAVENOUS
  Filled 2020-12-28: qty 8

## 2020-12-28 MED ORDER — LETROZOLE 2.5 MG PO TABS: 2.5000 mg | ORAL_TABLET | Freq: Every day | ORAL | 3 refills | Status: DC

## 2020-12-28 MED ORDER — SODIUM CHLORIDE 0.9% FLUSH
10.0000 mL | INTRAVENOUS | Status: DC | PRN
Start: 2020-12-28 — End: 2020-12-28
  Administered 2020-12-28: 10 mL
  Filled 2020-12-28: qty 10

## 2020-12-28 MED ORDER — ACETAMINOPHEN 325 MG PO TABS
ORAL_TABLET | ORAL | Status: AC
  Filled 2020-12-28: qty 2

## 2020-12-28 MED ORDER — ACETAMINOPHEN 325 MG PO TABS
650.0000 mg | ORAL_TABLET | Freq: Once | ORAL | Status: AC
  Administered 2020-12-28: 650 mg via ORAL

## 2020-12-28 MED ORDER — HEPARIN SOD (PORK) LOCK FLUSH 100 UNIT/ML IV SOLN
500.0000 [IU] | Freq: Once | INTRAVENOUS | Status: AC | PRN
  Administered 2020-12-28: 500 [IU]
  Filled 2020-12-28: qty 5

## 2020-12-28 NOTE — Progress Notes (Signed)
During visit pt saw Dr.Gudena and per Dr.Gudena ok to access port, draw labs, and run infusion though. This RN assessed port, it appeared red and irritated, it was not warm to the touch or swollen, pt reported no pain at port site, just a feeling of irritation. RN accessed pt port using sterile technique, sensitive skin dressing applied, and labs were drawn. Pt agreeable to having port accessed and agreeable to notify RN if they experience any issues with port during infusion and to call center if there are issues after.

## 2020-12-28 NOTE — Patient Instructions (Signed)
[facility] MEDICAL [facility]COL[facility]  Discharge Instructions: °Thank you for choosing [facility] to provide your oncology and hematology care.  ° °If you have a lab appointment with the Cancer Center, please go directly to the Cancer Center and check in at the registration area. °  °Wear comfortable clothing and clothing appropriate for easy access to any Portacath or PICC line.  ° °We strive to give you quality time with your provider. You may need to reschedule your appointment if you arrive late (15 or more minutes).  Arriving late affects you and other patients whose appointments are after yours.  Also, if you miss three or more appointments without notifying the office, you may be dismissed from the clinic at the provider’s discretion.    °  °For prescription refill requests, have your pharmacy contact our office and allow 72 hours for refills to be completed.   ° °Today you received the following chemotherapy and/or immunotherapy agents kadcyla    °  °To help prevent nausea and vomiting after your treatment, we encourage you to take your nausea medication as directed. ° °BELOW ARE SYMPTOMS THAT SHOULD BE REPORTED IMMEDIATELY: °*FEVER GREATER THAN 100.4 F (38 °C) OR HIGHER °*CHILLS OR SWEATING °*NAUSEA AND VOMITING THAT IS NOT CONTROLLED WITH YOUR NAUSEA MEDICATION °*UNUSUAL SHORTNESS OF BREATH °*UNUSUAL BRUISING OR BLEEDING °*URINARY PROBLEMS (pain or burning when urinating, or frequent urination) °*BOWEL PROBLEMS (unusual diarrhea, constipation, pain near the anus) °TENDERNESS IN MOUTH AND THROAT WITH OR WITHOUT PRESENCE OF ULCERS (sore throat, sores in mouth, or a toothache) °UNUSUAL RASH, SWELLING OR PAIN  °UNUSUAL VAGINAL DISCHARGE OR ITCHING  ° °Items with * indicate a potential emergency and should be followed up as soon as possible or go to the Emergency Department if any problems should occur. ° °Please show the CHEMOTHERAPY ALERT CARD or IMMUNOTHERAPY ALERT CARD at check-in to the  Emergency Department and triage nurse. ° °Should you have questions after your visit or need to cancel or reschedule your appointment, please contact [facility]  Dept: [phone]  and follow the prompts.  Office hours are 8:00 a.m. to 4:30 p.m. Monday - Friday. Please note that voicemails left after 4:00 p.m. may not be returned until the following business day.  We are closed weekends and major holidays. You have access to a nurse at all times for urgent questions. Please call the main number to the clinic Dept: [phone] and follow the prompts. ° ° °For any non-urgent questions, you may also contact your provider using MyChart. We now offer e-Visits for anyone 18 and older to request care online for non-urgent symptoms. For details visit [email or web address]. °  °Also download the MyChart app! Go to the app store, search "MyChart", open the app, select [facility], and log in with your MyChart username and password. ° °Due to Covid, a mask is required upon entering the hospital/clinic. If you do not have a mask, one will be given to you upon arrival. For doctor visits, patients may have 1 support person aged 18 or older with them. For treatment visits, patients cannot have anyone with them due to current Covid guidelines and our immunocompromised population.  ° °

## 2020-12-28 NOTE — Progress Notes (Signed)
Patient to have labs collected via port. Upon assessing port, port appears reddened and  irritated. Patient states this is new. Labs to be attempted peripherally by lab. Provider to assess port at check up today.

## 2020-12-29 ENCOUNTER — Ambulatory Visit: Payer: BC Managed Care – PPO | Admitting: Radiation Oncology

## 2020-12-30 ENCOUNTER — Other Ambulatory Visit: Payer: Self-pay | Admitting: *Deleted

## 2020-12-30 ENCOUNTER — Encounter: Payer: Self-pay | Admitting: Radiation Oncology

## 2020-12-30 DIAGNOSIS — Z13220 Encounter for screening for lipoid disorders: Secondary | ICD-10-CM | POA: Diagnosis not present

## 2020-12-30 DIAGNOSIS — Z170 Estrogen receptor positive status [ER+]: Secondary | ICD-10-CM

## 2020-12-30 DIAGNOSIS — E785 Hyperlipidemia, unspecified: Secondary | ICD-10-CM | POA: Diagnosis not present

## 2020-12-30 DIAGNOSIS — C50811 Malignant neoplasm of overlapping sites of right female breast: Secondary | ICD-10-CM

## 2021-01-02 ENCOUNTER — Encounter: Payer: Self-pay | Admitting: *Deleted

## 2021-01-02 DIAGNOSIS — Z510 Encounter for antineoplastic radiation therapy: Secondary | ICD-10-CM | POA: Diagnosis not present

## 2021-01-02 DIAGNOSIS — C50811 Malignant neoplasm of overlapping sites of right female breast: Secondary | ICD-10-CM | POA: Diagnosis not present

## 2021-01-03 ENCOUNTER — Telehealth: Payer: Self-pay | Admitting: Pharmacist

## 2021-01-03 ENCOUNTER — Ambulatory Visit
Admission: RE | Admit: 2021-01-03 | Discharge: 2021-01-03 | Disposition: A | Discharge status: Home / Self Care | Payer: BC Managed Care – PPO | Source: Ambulatory Visit | Attending: Radiation Oncology | Admitting: Radiation Oncology

## 2021-01-03 ENCOUNTER — Other Ambulatory Visit: Payer: Self-pay

## 2021-01-03 DIAGNOSIS — Z510 Encounter for antineoplastic radiation therapy: Secondary | ICD-10-CM | POA: Diagnosis not present

## 2021-01-03 DIAGNOSIS — C50811 Malignant neoplasm of overlapping sites of right female breast: Secondary | ICD-10-CM | POA: Diagnosis not present

## 2021-01-03 NOTE — Telephone Encounter (Signed)
Pt called w/ concerns about difficulty swallowing Calcium citrate tabs.  I have sent inbasket msg to [name] and Sharlynn Oliphant, RN to review and contact pt w/ possible alternative.  Kennith Center, Pharm.D., CPP 01/03/2021@11 :16 AM

## 2021-01-04 ENCOUNTER — Ambulatory Visit
Admission: RE | Admit: 2021-01-04 | Discharge: 2021-01-04 | Disposition: A | Discharge status: Home / Self Care | Payer: BC Managed Care – PPO | Source: Ambulatory Visit | Attending: Radiation Oncology | Admitting: Radiation Oncology

## 2021-01-04 ENCOUNTER — Ambulatory Visit: Payer: BC Managed Care – PPO | Admitting: Radiation Oncology

## 2021-01-04 ENCOUNTER — Ambulatory Visit: Payer: BC Managed Care – PPO | Attending: General Surgery | Admitting: Physical Therapy

## 2021-01-04 DIAGNOSIS — C50511 Malignant neoplasm of lower-outer quadrant of right female breast: Secondary | ICD-10-CM | POA: Diagnosis not present

## 2021-01-04 DIAGNOSIS — Z170 Estrogen receptor positive status [ER+]: Secondary | ICD-10-CM | POA: Diagnosis not present

## 2021-01-04 DIAGNOSIS — Z483 Aftercare following surgery for neoplasm: Secondary | ICD-10-CM | POA: Diagnosis not present

## 2021-01-04 DIAGNOSIS — R293 Abnormal posture: Secondary | ICD-10-CM | POA: Insufficient documentation

## 2021-01-04 DIAGNOSIS — C50811 Malignant neoplasm of overlapping sites of right female breast: Secondary | ICD-10-CM | POA: Diagnosis not present

## 2021-01-04 DIAGNOSIS — Z510 Encounter for antineoplastic radiation therapy: Secondary | ICD-10-CM | POA: Diagnosis not present

## 2021-01-04 NOTE — Patient Instructions (Signed)
First of all, check with your insurance company to see if provider is in [facility] (for wigs and compression sleeves / gloves/gauntlets )  [place], [place] [address] [phone]  Will file some insurances --- call for appointment   Second to [facility] (for mastectomy prosthetics and garments) [place], [place] [address] [phone] Will file some insurances --- call for appointment  [facility]  [address]  [place], [place] [address] [phone] Lower extremity garments  [facility] [place] [address] [place], [place]  [address] [facility] (for compression garments, especilly for lower extremities) [address] [place], [place]  [address] [phone] Call for appointment    Jerrol Banana ,certified fitter [place]  [phone]  [employer] (for mastectomy supplies and garments) [address] [place], [place] [address] [phone]  Other Resources: National Lymphedema Network:  www.lymphnet.org www.Klosetraining.com for patient articles and self manual lymph drainage information www.lymphedemablog.com has informative articles.  DishTag.es.com www.lymphedemaproducts.com www.brightlifedirect.com

## 2021-01-04 NOTE — Progress Notes (Signed)
Pt here for patient teaching.  Pt given Radiation and You booklet, skin care instructions, Alra deodorant, and Radiaplex gel.  Reviewed areas of pertinence such as fatigue, hair loss, skin changes, breast tenderness, and breast swelling . Pt able to give teach back of to pat skin and use unscented/gentle soap,apply Radiaplex bid, avoid applying anything to skin within 4 hours of treatment, avoid wearing an under wire bra, and to use an electric razor if they must shave. Pt verbalizes understanding of information given and will contact nursing with any questions or concerns.     Http://rtanswers.org/treatmentinformation/whattoexpect/index  Rainer Mounce M. Chealsey Miyamoto RN, BSN       

## 2021-01-04 NOTE — Therapy (Signed)
[place], [place], [address] Phone: [phone]   Fax:  [phone]  Physical Therapy Treatment  Patient Details  Name: Teresa Sheppard MRN: [id number] Date of Birth: 02-11-1961 Referring Provider (PT): [name]   Encounter Date: 01/04/2021   PT End of Session - 01/04/21 1603     Visit Number 2    Number of Visits 6    Date for PT Re-Evaluation 04/06/21   in case pt wants to return after radiation to learn strength ABC program   PT Start Time 1600    PT Stop Time 4481    PT Time Calculation (min) 45 min    Activity Tolerance Patient tolerated treatment well    Behavior During Therapy [facility] for tasks assessed/performed             Past Medical History:  Diagnosis Date   Anxiety    Breast cancer in female [facility])    Right   Family history of breast cancer 11/07/2020   Family history of pancreatic cancer 11/07/2020   History of kidney stones 2014-2015   Hyperthyroidism    No longer an issue   PONV (postoperative nausea and vomiting)    Thyromegaly     Past Surgical History:  Procedure Laterality Date   ABDOMINAL WALL DEFECT REPAIR     following hernia repair in 2010   [place]     2008  while pregnant with last child. Scar tissue from appendectomy wrapped around bowel and was snipped to free bowel.    CESAREAN SECTION     DILATION AND CURETTAGE OF UTERUS     2000 and 2014   HERNIA REPAIR     MASTECTOMY W/ SENTINEL NODE BIOPSY Right 11/28/2020   Procedure: RIGHT MASTECTOMY WITH RIGHT AXILLARY SENTINEL LYMPH NODE BIOPSY;  Surgeon: [name], MD;  Location: [facility];  Service: General;  Laterality: Right;   PORTACATH PLACEMENT Left 07/12/2020   Procedure: INSERTION PORT-A-CATH WITH ULTRASOUND GUIDANCE;  Surgeon: [name], MD;  Location: [facility];  Service: General;  Laterality: Left;   RADIOACTIVE SEED GUIDED AXILLARY SENTINEL LYMPH NODE Right 11/28/2020    Procedure: RADIOACTIVE SEED GUIDED RIGHT AXILLARY AXILLARY SENTINEL LYMPH NODE EXCISION;  Surgeon: [name], MD;  Location: [facility];  Service: General;  Laterality: Right;   RADIOACTIVE SEED GUIDED EXCISIONAL BREAST BIOPSY Left 11/28/2020   Procedure: RADIOACTIVE SEED GUIDED LEFT EXCISIONAL BREAST BIOPSY;  Surgeon: [name], MD;  Location: [facility];  Service: General;  Laterality: Left;   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   Subjective Assessment - 01/04/21 1504     Subjective Pt said she started radiation. She does have some peripheral neuropathy in her feet with a teeny bit of tingling in her fingers. she has been starting to do run/walk    Pertinent History R breast cancer, left outer quadrant, triple positive, Ki 67 - 60%, grade 2 -3 IDC, neoadjuvant chemo and still is getting immuno therapy,  right mastectomy on 11/28/2020 with 4 nodes removed on the right and she had an excision of breast tissue on the left that is benign  and just started radiation    Patient Stated Goals to gain info from provider, pt wants to eventually return to play tennis    Currently  in Pain? No/denies                [facility] PT Assessment - 01/04/21 0001       Assessment   Medical Diagnosis R breast cancer    Referring Provider (PT) [name]    Onset Date/Surgical Date 06/16/20    Hand Dominance Right      Observation/Other Assessments   Observations well healing incision in right chest and axilla with slight fullness under lateral and central incision and at axilla. port site is irritated    Other Surveys  Quick Dash    Quick DASH  13.64      Sensation   Additional Comments some tingling in righ upper arm      Posture/Postural Control   Posture/Postural Control Postural limitations    Postural Limitations Rounded Shoulders;Forward head      AROM   Right  Shoulder Flexion 170 Degrees    Right Shoulder ABduction 179 Degrees    Right Shoulder External Rotation 90 Degrees               LYMPHEDEMA/ONCOLOGY QUESTIONNAIRE - 01/04/21 0001       Right Upper Extremity Lymphedema   15 cm Proximal to Olecranon Process 27 cm    Olecranon Process 23.5 cm    15 cm Proximal to Ulnar Styloid Process 20.7 cm    Just Proximal to Ulnar Styloid Process 15 cm    Across Hand at [employer] 17.3 cm    At [place] of 2nd Digit 5.7 cm                Quick Dash - 01/04/21 0001     Open a tight or new jar Mild difficulty    Do heavy household chores (wash walls, wash floors) No difficulty    Carry a shopping bag or briefcase No difficulty    Wash your back No difficulty    Use a knife to cut food No difficulty    Recreational activities in which you take some force or impact through your arm, shoulder, or hand (golf, hammering, tennis) Moderate difficulty    During the past week, to what extent has your arm, shoulder or hand problem interfered with your normal social activities with family, friends, neighbors, or groups? Modererately    During the past week, to what extent has your arm, shoulder or hand problem limited your work or other regular daily activities Not at all    Arm, shoulder, or hand pain. None    Tingling (pins and needles) in your arm, shoulder, or hand Mild    Difficulty Sleeping No difficulty    DASH Score 13.64 %                    [facility] Adult PT Treatment/Exercise - 01/04/21 0001       Self-Care   Self-Care Other Self-Care Comments    Other Self-Care Comments  gave pt a "donut " of white foam to wear over port to protect it from being irritated by clothing . gave pt thin foam patch to wear inside bra at full areas of right chest. gave pt infomation about getting a light prarie hugger vida to compression bra  . advised pt she could come back to PT after she completes radiation to learn strength ABC program to gain  strength to return to tennis                    PT  Education - 01/04/21 1602     Education Details how to wear light compression over chest to help with post op swelling    Person(s) Educated Patient    Methods Explanation    Comprehension Verbalized understanding                 PT Long Term Goals - 01/04/21 1608       PT LONG TERM GOAL #1   Title Pt will return to baseline ROM measurements and will not demonstrate any signs of lymphedema.    Time 6    Period Months    Status Achieved      PT LONG TERM GOAL #2   Title Pt will be independent in a strengthening home exercise program to return to desired activities and decrease risk of lymphedema    Time 12    Period Weeks    Status New                   Plan - 01/04/21 1604     Clinical Impression Statement Pt is doing very well after surgery and has regained shoulder ROM and has no signs of lymphedema. She does have mild post op swelling at chest wall and axilla and was give a foam patch to address this with information about a compression bra  to help also. She does not need skilled PT at this time but would benefit from a few sessions after she is recovered from radiation to help return strenth to previous levels so she can return to tennis  SOZO screen scheduled    Personal Factors and Comorbidities Comorbidity 2    Comorbidities mastectomy and radition    Stability/Clinical Decision Making Stable/Uncomplicated    Clinical Decision Making Low    Rehab Potential Good    PT Frequency Other (comment)   to be determined after radiation   PT Treatment/Interventions ADLs/Self Care Home Management;Patient/family education;Therapeutic exercise    PT Next Visit Plan re eval after radiation, teach strength ABC program to return to tennis if pt wants to, follow for SOZO screens    Consulted and Agree with Plan of Care Patient             Patient will benefit from skilled therapeutic intervention in  order to improve the following deficits and impairments:  Postural dysfunction, Decreased knowledge of precautions, Decreased knowledge of use of DME, Decreased strength, Increased fascial restricitons, Decreased activity tolerance  Visit Diagnosis: Abnormal posture - Plan: PT plan of care cert/re-cert  Malignant neoplasm of lower-outer quadrant of right breast of female, estrogen receptor positive ([place]) - Plan: PT plan of care cert/re-cert  Aftercare following surgery for neoplasm - Plan: PT plan of care cert/re-cert     Problem List Patient Active Problem List   Diagnosis Date Noted   S/P mastectomy, left 11/28/2020   Genetic testing 11/15/2020   Family history of breast cancer 11/07/2020   Family history of pancreatic cancer 11/07/2020   Port-A-Cath in place 10/26/2020   Dysphagia 10/26/2020   Acute urinary tract infection 10/14/2020   Malignant tumor of breast ([place]) 06/22/2020   Malignant neoplasm of overlapping sites of right breast in female, estrogen receptor positive ([place]) 06/21/2020   Hyperthyroidism 03/28/2016   Donato Heinz. Owens Shark PT  [place] 01/04/2021, [place], [place], [address] Phone: [phone]   Fax:  [phone]  Name: Teresa Sheppard MRN: [id number] Date of Birth: 06-10-1961

## 2021-01-05 ENCOUNTER — Ambulatory Visit
Admission: RE | Admit: 2021-01-05 | Discharge: 2021-01-05 | Disposition: A | Discharge status: Home / Self Care | Payer: BC Managed Care – PPO | Source: Ambulatory Visit | Attending: Radiation Oncology | Admitting: Radiation Oncology

## 2021-01-05 DIAGNOSIS — C50811 Malignant neoplasm of overlapping sites of right female breast: Secondary | ICD-10-CM | POA: Diagnosis not present

## 2021-01-05 DIAGNOSIS — Z510 Encounter for antineoplastic radiation therapy: Secondary | ICD-10-CM | POA: Diagnosis not present

## 2021-01-06 ENCOUNTER — Ambulatory Visit
Admission: RE | Admit: 2021-01-06 | Discharge: 2021-01-06 | Disposition: A | Discharge status: Home / Self Care | Payer: BC Managed Care – PPO | Source: Ambulatory Visit | Attending: Radiation Oncology | Admitting: Radiation Oncology

## 2021-01-06 ENCOUNTER — Other Ambulatory Visit: Payer: Self-pay

## 2021-01-06 DIAGNOSIS — Z510 Encounter for antineoplastic radiation therapy: Secondary | ICD-10-CM | POA: Diagnosis not present

## 2021-01-06 DIAGNOSIS — C50811 Malignant neoplasm of overlapping sites of right female breast: Secondary | ICD-10-CM | POA: Diagnosis not present

## 2021-01-06 MED ORDER — ALRA NON-METALLIC DEODORANT (RAD-ONC)
1.0000 | Freq: Once | TOPICAL | Status: AC
Start: 2021-01-06 — End: 2021-01-06
  Administered 2021-01-06: 1 via TOPICAL

## 2021-01-06 MED ORDER — RADIAPLEXRX EX GEL: Freq: Once | CUTANEOUS | Status: AC

## 2021-01-09 ENCOUNTER — Ambulatory Visit
Admission: RE | Admit: 2021-01-09 | Discharge: 2021-01-09 | Disposition: A | Discharge status: Home / Self Care | Payer: BC Managed Care – PPO | Source: Ambulatory Visit | Attending: Radiation Oncology | Admitting: Radiation Oncology

## 2021-01-09 ENCOUNTER — Other Ambulatory Visit: Payer: Self-pay

## 2021-01-09 DIAGNOSIS — C50811 Malignant neoplasm of overlapping sites of right female breast: Secondary | ICD-10-CM | POA: Diagnosis not present

## 2021-01-09 DIAGNOSIS — Z510 Encounter for antineoplastic radiation therapy: Secondary | ICD-10-CM | POA: Diagnosis not present

## 2021-01-10 ENCOUNTER — Ambulatory Visit
Admission: RE | Admit: 2021-01-10 | Discharge: 2021-01-10 | Disposition: A | Discharge status: Home / Self Care | Payer: BC Managed Care – PPO | Source: Ambulatory Visit | Attending: Radiation Oncology | Admitting: Radiation Oncology

## 2021-01-10 DIAGNOSIS — C50811 Malignant neoplasm of overlapping sites of right female breast: Secondary | ICD-10-CM | POA: Diagnosis not present

## 2021-01-10 DIAGNOSIS — Z510 Encounter for antineoplastic radiation therapy: Secondary | ICD-10-CM | POA: Diagnosis not present

## 2021-01-11 ENCOUNTER — Other Ambulatory Visit: Payer: Self-pay

## 2021-01-11 ENCOUNTER — Ambulatory Visit
Admission: RE | Admit: 2021-01-11 | Discharge: 2021-01-11 | Disposition: A | Discharge status: Home / Self Care | Payer: BC Managed Care – PPO | Source: Ambulatory Visit | Attending: Radiation Oncology | Admitting: Radiation Oncology

## 2021-01-11 DIAGNOSIS — C50811 Malignant neoplasm of overlapping sites of right female breast: Secondary | ICD-10-CM | POA: Diagnosis not present

## 2021-01-11 DIAGNOSIS — Z510 Encounter for antineoplastic radiation therapy: Secondary | ICD-10-CM | POA: Diagnosis not present

## 2021-01-12 ENCOUNTER — Other Ambulatory Visit: Payer: Self-pay

## 2021-01-12 ENCOUNTER — Ambulatory Visit
Admission: RE | Admit: 2021-01-12 | Discharge: 2021-01-12 | Disposition: A | Discharge status: Home / Self Care | Payer: BC Managed Care – PPO | Source: Ambulatory Visit | Attending: Radiation Oncology | Admitting: Radiation Oncology

## 2021-01-12 DIAGNOSIS — Z510 Encounter for antineoplastic radiation therapy: Secondary | ICD-10-CM | POA: Diagnosis not present

## 2021-01-12 DIAGNOSIS — C50811 Malignant neoplasm of overlapping sites of right female breast: Secondary | ICD-10-CM | POA: Diagnosis not present

## 2021-01-13 ENCOUNTER — Ambulatory Visit
Admission: RE | Admit: 2021-01-13 | Discharge: 2021-01-13 | Disposition: A | Discharge status: Home / Self Care | Payer: BC Managed Care – PPO | Source: Ambulatory Visit | Attending: Radiation Oncology | Admitting: Radiation Oncology

## 2021-01-13 ENCOUNTER — Other Ambulatory Visit: Payer: Self-pay

## 2021-01-13 DIAGNOSIS — C50811 Malignant neoplasm of overlapping sites of right female breast: Secondary | ICD-10-CM | POA: Diagnosis not present

## 2021-01-13 DIAGNOSIS — Z510 Encounter for antineoplastic radiation therapy: Secondary | ICD-10-CM | POA: Diagnosis not present

## 2021-01-16 ENCOUNTER — Other Ambulatory Visit: Payer: Self-pay | Admitting: *Deleted

## 2021-01-16 ENCOUNTER — Ambulatory Visit
Admission: RE | Admit: 2021-01-16 | Discharge: 2021-01-16 | Disposition: A | Discharge status: Home / Self Care | Payer: BC Managed Care – PPO | Source: Ambulatory Visit | Attending: Radiation Oncology | Admitting: Radiation Oncology

## 2021-01-16 ENCOUNTER — Other Ambulatory Visit: Payer: Self-pay

## 2021-01-16 DIAGNOSIS — C50811 Malignant neoplasm of overlapping sites of right female breast: Secondary | ICD-10-CM | POA: Diagnosis not present

## 2021-01-16 DIAGNOSIS — Z510 Encounter for antineoplastic radiation therapy: Secondary | ICD-10-CM | POA: Diagnosis not present

## 2021-01-16 MED ORDER — BIMATOPROST 0.03 % EX SOLN: 1.0000 [drp] | Freq: Every day | CUTANEOUS | 12 refills | Status: DC

## 2021-01-17 ENCOUNTER — Ambulatory Visit
Admission: RE | Admit: 2021-01-17 | Discharge: 2021-01-17 | Disposition: A | Discharge status: Home / Self Care | Payer: BC Managed Care – PPO | Source: Ambulatory Visit | Attending: Radiation Oncology | Admitting: Radiation Oncology

## 2021-01-17 DIAGNOSIS — Z510 Encounter for antineoplastic radiation therapy: Secondary | ICD-10-CM | POA: Diagnosis not present

## 2021-01-17 DIAGNOSIS — C50811 Malignant neoplasm of overlapping sites of right female breast: Secondary | ICD-10-CM | POA: Diagnosis not present

## 2021-01-18 ENCOUNTER — Inpatient Hospital Stay: Payer: BC Managed Care – PPO

## 2021-01-18 ENCOUNTER — Ambulatory Visit: Payer: BC Managed Care – PPO

## 2021-01-18 ENCOUNTER — Telehealth: Payer: Self-pay | Admitting: *Deleted

## 2021-01-18 ENCOUNTER — Other Ambulatory Visit: Payer: Self-pay

## 2021-01-18 VITALS — BP 127/57 | HR 54 | Resp 16

## 2021-01-18 DIAGNOSIS — Z170 Estrogen receptor positive status [ER+]: Secondary | ICD-10-CM

## 2021-01-18 DIAGNOSIS — Z95828 Presence of other vascular implants and grafts: Secondary | ICD-10-CM

## 2021-01-18 DIAGNOSIS — C50811 Malignant neoplasm of overlapping sites of right female breast: Secondary | ICD-10-CM

## 2021-01-18 DIAGNOSIS — Z5112 Encounter for antineoplastic immunotherapy: Secondary | ICD-10-CM | POA: Diagnosis not present

## 2021-01-18 LAB — CBC WITH DIFFERENTIAL/PLATELET
Abs Immature Granulocytes: 0.01 10*3/uL (ref 0.00–0.07)
Basophils Absolute: 0.1 10*3/uL (ref 0.0–0.1)
Basophils Relative: 1 %
Eosinophils Absolute: 0.3 10*3/uL (ref 0.0–0.5)
Eosinophils Relative: 10 %
HCT: 33.7 % — ABNORMAL LOW (ref 36.0–46.0)
Hemoglobin: 11.6 g/dL — ABNORMAL LOW (ref 12.0–15.0)
Immature Granulocytes: 0 %
Lymphocytes Relative: 26 %
Lymphs Abs: 0.9 10*3/uL (ref 0.7–4.0)
MCH: 32.6 pg (ref 26.0–34.0)
MCHC: 34.4 g/dL (ref 30.0–36.0)
MCV: 94.7 fL (ref 80.0–100.0)
Monocytes Absolute: 0.4 10*3/uL (ref 0.1–1.0)
Monocytes Relative: 12 %
Neutro Abs: 1.8 10*3/uL (ref 1.7–7.7)
Neutrophils Relative %: 51 %
Platelets: 128 10*3/uL — ABNORMAL LOW (ref 150–400)
RBC: 3.56 MIL/uL — ABNORMAL LOW (ref 3.87–5.11)
RDW: 12 % (ref 11.5–15.5)
WBC: 3.5 10*3/uL — ABNORMAL LOW (ref 4.0–10.5)
nRBC: 0 % (ref 0.0–0.2)

## 2021-01-18 LAB — COMPREHENSIVE METABOLIC PANEL
ALT: 31 U/L (ref 0–44)
AST: 46 U/L — ABNORMAL HIGH (ref 15–41)
Albumin: 3.9 g/dL (ref 3.5–5.0)
Alkaline Phosphatase: 86 U/L (ref 38–126)
Anion gap: 7 (ref 5–15)
BUN: 15 mg/dL (ref 6–20)
CO2: 26 mmol/L (ref 22–32)
Calcium: 9.6 mg/dL (ref 8.9–10.3)
Chloride: 106 mmol/L (ref 98–111)
Creatinine, Ser: 0.69 mg/dL (ref 0.44–1.00)
GFR, Estimated: >60 mL/min (ref >60)
Glucose, Bld: 98 mg/dL (ref 70–99)
Potassium: 4 mmol/L (ref 3.5–5.1)
Sodium: 139 mmol/L (ref 135–145)
Total Bilirubin: 0.3 mg/dL (ref 0.3–1.2)
Total Protein: 7.6 g/dL (ref 6.5–8.1)

## 2021-01-18 LAB — LIPID PANEL
Cholesterol: 198 mg/dL (ref 0–200)
HDL: 65 mg/dL (ref >40)
LDL Cholesterol: 79 mg/dL (ref 0–99)
Total CHOL/HDL Ratio: 3 RATIO
Triglycerides: 271 mg/dL — ABNORMAL HIGH (ref <150)
VLDL: 54 mg/dL — ABNORMAL HIGH (ref 0–40)

## 2021-01-18 MED ORDER — SODIUM CHLORIDE 0.9 % IV SOLN
3.8000 mg/kg | Freq: Once | INTRAVENOUS | Status: AC
  Administered 2021-01-18: 260 mg via INTRAVENOUS
  Filled 2021-01-18: qty 5

## 2021-01-18 MED ORDER — SODIUM CHLORIDE 0.9 % IV SOLN
Freq: Once | INTRAVENOUS | Status: AC
  Filled 2021-01-18: qty 250

## 2021-01-18 MED ORDER — DIPHENHYDRAMINE HCL 25 MG PO CAPS
ORAL_CAPSULE | ORAL | Status: AC
  Filled 2021-01-18: qty 2

## 2021-01-18 MED ORDER — DIPHENHYDRAMINE HCL 25 MG PO CAPS
50.0000 mg | ORAL_CAPSULE | Freq: Once | ORAL | Status: AC
Start: 2021-01-18 — End: 2021-01-18
  Administered 2021-01-18: 50 mg via ORAL

## 2021-01-18 MED ORDER — SODIUM CHLORIDE 0.9% FLUSH
10.0000 mL | Freq: Once | INTRAVENOUS | Status: AC
  Administered 2021-01-18: 10 mL
  Filled 2021-01-18: qty 10

## 2021-01-18 MED ORDER — HEPARIN SOD (PORK) LOCK FLUSH 100 UNIT/ML IV SOLN
500.0000 [IU] | Freq: Once | INTRAVENOUS | Status: AC | PRN
  Administered 2021-01-18: 500 [IU]
  Filled 2021-01-18: qty 5

## 2021-01-18 MED ORDER — ACETAMINOPHEN 325 MG PO TABS
650.0000 mg | ORAL_TABLET | Freq: Once | ORAL | Status: AC
  Administered 2021-01-18: 650 mg via ORAL

## 2021-01-18 MED ORDER — SODIUM CHLORIDE 0.9% FLUSH
10.0000 mL | INTRAVENOUS | Status: DC | PRN
  Administered 2021-01-18: 10 mL
  Filled 2021-01-18: qty 10

## 2021-01-18 MED ORDER — ACETAMINOPHEN 325 MG PO TABS
ORAL_TABLET | ORAL | Status: AC
  Filled 2021-01-18: qty 2

## 2021-01-18 NOTE — Patient Instructions (Signed)
[facility]COL[facility]   °Discharge Instructions: °Thank you for choosing [facility] to provide your oncology and hematology care.  ° °If you have a lab appointment with the Cancer Center, please go directly to the Cancer Center and check in at the registration area. °  °Wear comfortable clothing and clothing appropriate for easy access to any Portacath or PICC line.  ° °We strive to give you quality time with your provider. You may need to reschedule your appointment if you arrive late (15 or more minutes).  Arriving late affects you and other patients whose appointments are after yours.  Also, if you miss three or more appointments without notifying the office, you may be dismissed from the clinic at the provider’s discretion.    °  °For prescription refill requests, have your pharmacy contact our office and allow 72 hours for refills to be completed.   ° °Today you received the following chemotherapy and/or immunotherapy agents: ado-trastuzumab emtansine    °  °To help prevent nausea and vomiting after your treatment, we encourage you to take your nausea medication as directed. ° °BELOW ARE SYMPTOMS THAT SHOULD BE REPORTED IMMEDIATELY: °*FEVER GREATER THAN 100.4 F (38 °C) OR HIGHER °*CHILLS OR SWEATING °*NAUSEA AND VOMITING THAT IS NOT CONTROLLED WITH YOUR NAUSEA MEDICATION °*UNUSUAL SHORTNESS OF BREATH °*UNUSUAL BRUISING OR BLEEDING °*URINARY PROBLEMS (pain or burning when urinating, or frequent urination) °*BOWEL PROBLEMS (unusual diarrhea, constipation, pain near the anus) °TENDERNESS IN MOUTH AND THROAT WITH OR WITHOUT PRESENCE OF ULCERS (sore throat, sores in mouth, or a toothache) °UNUSUAL RASH, SWELLING OR PAIN  °UNUSUAL VAGINAL DISCHARGE OR ITCHING  ° °Items with * indicate a potential emergency and should be followed up as soon as possible or go to the Emergency Department if any problems should occur. ° °Please show the CHEMOTHERAPY ALERT CARD or IMMUNOTHERAPY ALERT  CARD at check-in to the Emergency Department and triage nurse. ° °Should you have questions after your visit or need to cancel or reschedule your appointment, please contact [facility]  Dept: [phone]  and follow the prompts.  Office hours are 8:00 a.m. to 4:30 p.m. Monday - Friday. Please note that voicemails left after 4:00 p.m. may not be returned until the following business day.  We are closed weekends and major holidays. You have access to a nurse at all times for urgent questions. Please call the main number to the clinic Dept: [phone] and follow the prompts. ° ° °For any non-urgent questions, you may also contact your provider using MyChart. We now offer e-Visits for anyone 18 and older to request care online for non-urgent symptoms. For details visit [email or web address]. °  °Also download the MyChart app! Go to the app store, search "MyChart", open the app, select [facility], and log in with your MyChart username and password. ° °Due to Covid, a mask is required upon entering the hospital/clinic. If you do not have a mask, one will be given to you upon arrival. For doctor visits, patients may have 1 support person aged 18 or older with them. For treatment visits, patients cannot have anyone with them due to current Covid guidelines and our immunocompromised population.  ° °

## 2021-01-18 NOTE — Telephone Encounter (Signed)
Bimatoprost 0.03% sol prior authorization request submitted 7/26/20022.  Unfavorable outcome denied per [facility] per CoverMyMeds Key: [id number].

## 2021-01-19 ENCOUNTER — Ambulatory Visit
Admission: RE | Admit: 2021-01-19 | Discharge: 2021-01-19 | Disposition: A | Discharge status: Home / Self Care | Payer: BC Managed Care – PPO | Source: Ambulatory Visit | Attending: Radiation Oncology | Admitting: Radiation Oncology

## 2021-01-19 DIAGNOSIS — Z510 Encounter for antineoplastic radiation therapy: Secondary | ICD-10-CM | POA: Diagnosis not present

## 2021-01-19 DIAGNOSIS — C50811 Malignant neoplasm of overlapping sites of right female breast: Secondary | ICD-10-CM | POA: Diagnosis not present

## 2021-01-20 ENCOUNTER — Other Ambulatory Visit: Payer: Self-pay

## 2021-01-20 ENCOUNTER — Ambulatory Visit
Admission: RE | Admit: 2021-01-20 | Discharge: 2021-01-20 | Disposition: A | Discharge status: Home / Self Care | Payer: BC Managed Care – PPO | Source: Ambulatory Visit | Attending: Radiation Oncology | Admitting: Radiation Oncology

## 2021-01-20 DIAGNOSIS — C50811 Malignant neoplasm of overlapping sites of right female breast: Secondary | ICD-10-CM | POA: Diagnosis not present

## 2021-01-20 DIAGNOSIS — Z510 Encounter for antineoplastic radiation therapy: Secondary | ICD-10-CM | POA: Diagnosis not present

## 2021-01-20 DIAGNOSIS — Z170 Estrogen receptor positive status [ER+]: Secondary | ICD-10-CM

## 2021-01-20 MED ORDER — RADIAPLEXRX EX GEL: Freq: Once | CUTANEOUS | Status: AC

## 2021-01-23 ENCOUNTER — Other Ambulatory Visit: Payer: Self-pay

## 2021-01-23 ENCOUNTER — Ambulatory Visit
Admission: RE | Admit: 2021-01-23 | Discharge: 2021-01-23 | Disposition: A | Discharge status: Home / Self Care | Payer: BC Managed Care – PPO | Source: Ambulatory Visit | Attending: Radiation Oncology | Admitting: Radiation Oncology

## 2021-01-23 DIAGNOSIS — Z170 Estrogen receptor positive status [ER+]: Secondary | ICD-10-CM | POA: Diagnosis not present

## 2021-01-23 DIAGNOSIS — C50811 Malignant neoplasm of overlapping sites of right female breast: Secondary | ICD-10-CM | POA: Diagnosis not present

## 2021-01-23 DIAGNOSIS — Z510 Encounter for antineoplastic radiation therapy: Secondary | ICD-10-CM | POA: Diagnosis not present

## 2021-01-24 ENCOUNTER — Ambulatory Visit
Admission: RE | Admit: 2021-01-24 | Discharge: 2021-01-24 | Disposition: A | Discharge status: Home / Self Care | Payer: BC Managed Care – PPO | Source: Ambulatory Visit | Attending: Radiation Oncology | Admitting: Radiation Oncology

## 2021-01-24 DIAGNOSIS — Z170 Estrogen receptor positive status [ER+]: Secondary | ICD-10-CM | POA: Diagnosis not present

## 2021-01-24 DIAGNOSIS — C50811 Malignant neoplasm of overlapping sites of right female breast: Secondary | ICD-10-CM | POA: Diagnosis not present

## 2021-01-24 DIAGNOSIS — Z510 Encounter for antineoplastic radiation therapy: Secondary | ICD-10-CM | POA: Diagnosis not present

## 2021-01-25 ENCOUNTER — Ambulatory Visit
Admission: RE | Admit: 2021-01-25 | Discharge: 2021-01-25 | Disposition: A | Discharge status: Home / Self Care | Payer: BC Managed Care – PPO | Source: Ambulatory Visit | Attending: Radiation Oncology | Admitting: Radiation Oncology

## 2021-01-25 ENCOUNTER — Other Ambulatory Visit: Payer: Self-pay

## 2021-01-25 DIAGNOSIS — Z510 Encounter for antineoplastic radiation therapy: Secondary | ICD-10-CM | POA: Diagnosis not present

## 2021-01-25 DIAGNOSIS — Z170 Estrogen receptor positive status [ER+]: Secondary | ICD-10-CM | POA: Diagnosis not present

## 2021-01-25 DIAGNOSIS — C50811 Malignant neoplasm of overlapping sites of right female breast: Secondary | ICD-10-CM | POA: Diagnosis not present

## 2021-01-26 ENCOUNTER — Ambulatory Visit
Admission: RE | Admit: 2021-01-26 | Discharge: 2021-01-26 | Disposition: A | Discharge status: Home / Self Care | Payer: BC Managed Care – PPO | Source: Ambulatory Visit | Attending: Radiation Oncology | Admitting: Radiation Oncology

## 2021-01-26 DIAGNOSIS — Z510 Encounter for antineoplastic radiation therapy: Secondary | ICD-10-CM | POA: Diagnosis not present

## 2021-01-26 DIAGNOSIS — C50811 Malignant neoplasm of overlapping sites of right female breast: Secondary | ICD-10-CM | POA: Diagnosis not present

## 2021-01-26 DIAGNOSIS — Z170 Estrogen receptor positive status [ER+]: Secondary | ICD-10-CM | POA: Diagnosis not present

## 2021-01-27 ENCOUNTER — Other Ambulatory Visit: Payer: Self-pay

## 2021-01-27 ENCOUNTER — Ambulatory Visit
Admission: RE | Admit: 2021-01-27 | Payer: BC Managed Care – PPO | Source: Ambulatory Visit | Admitting: Radiation Oncology

## 2021-01-27 ENCOUNTER — Ambulatory Visit
Admission: RE | Admit: 2021-01-27 | Discharge: 2021-01-27 | Disposition: A | Discharge status: Home / Self Care | Payer: BC Managed Care – PPO | Source: Ambulatory Visit | Attending: Radiation Oncology | Admitting: Radiation Oncology

## 2021-01-27 ENCOUNTER — Encounter (HOSPITAL_COMMUNITY): Payer: Self-pay | Admitting: General Surgery

## 2021-01-27 DIAGNOSIS — Z510 Encounter for antineoplastic radiation therapy: Secondary | ICD-10-CM | POA: Diagnosis not present

## 2021-01-27 DIAGNOSIS — Z170 Estrogen receptor positive status [ER+]: Secondary | ICD-10-CM | POA: Diagnosis not present

## 2021-01-27 DIAGNOSIS — C50811 Malignant neoplasm of overlapping sites of right female breast: Secondary | ICD-10-CM | POA: Diagnosis not present

## 2021-01-27 NOTE — Progress Notes (Signed)
PCP - Dr. Lysle Rubens  Cardiologist - denies EKG - 10/15/20 Chest x-ray -  ECHO - 11/02/20 Cardiac Cath -  CPAP -   COVID TEST- n/a  Anesthesia review: n/a  -------------  SDW INSTRUCTIONS:  Your procedure is scheduled on Monday 8/8. Please report to [facility] Main Entrance "A" at [place].M., and check in at the Admitting office. Call this number if you have problems the morning of surgery: [phone]   Remember: Do not eat or drink after midnight the night before your surgery   Medications to take morning of surgery with a sip of water include: acetaminophen (TYLENOL) if needed letrozole (FEMARA)  loratadine (CLARITIN)   As of today, STOP taking any Aspirin (unless otherwise instructed by your surgeon), Aleve, Naproxen, Ibuprofen, Motrin, Advil, Goody's, BC's, all herbal medications, fish oil, and all vitamins.    The Morning of Surgery Do not wear jewelry, make-up or nail polish. Do not wear lotions, powders, or perfumes, or deodorant Do not bring valuables to the hospital. [facility] is not responsible for any belongings or valuables.  If you are a smoker, DO NOT Smoke 24 hours prior to surgery  If you wear a CPAP at night please bring your mask the morning of surgery   Remember that you must have someone to transport you home after your surgery, and remain with you for 24 hours if you are discharged the same day.  Please bring cases for contacts, glasses, hearing aids, dentures or bridgework because it cannot be worn into surgery.   Patients discharged the day of surgery will not be allowed to drive home.   Please shower the NIGHT BEFORE/MORNING OF SURGERY (use antibacterial soap like DIAL soap if possible). Wear comfortable clothes the morning of surgery. Oral Hygiene is also important to reduce your risk of infection.  Remember - BRUSH YOUR TEETH THE MORNING OF SURGERY WITH YOUR REGULAR TOOTHPASTE  Patient denies shortness of breath, fever, cough and chest pain.

## 2021-01-30 ENCOUNTER — Ambulatory Visit
Admission: RE | Admit: 2021-01-30 | Discharge: 2021-01-30 | Disposition: A | Discharge status: Home / Self Care | Payer: BC Managed Care – PPO | Source: Ambulatory Visit | Attending: Radiation Oncology | Admitting: Radiation Oncology

## 2021-01-30 ENCOUNTER — Ambulatory Visit (HOSPITAL_COMMUNITY): Payer: BC Managed Care – PPO | Admitting: Anesthesiology

## 2021-01-30 ENCOUNTER — Encounter (HOSPITAL_COMMUNITY): Payer: Self-pay | Admitting: General Surgery

## 2021-01-30 ENCOUNTER — Other Ambulatory Visit: Payer: Self-pay

## 2021-01-30 ENCOUNTER — Encounter (HOSPITAL_COMMUNITY)
Admission: RE | Disposition: A | Discharge status: Home / Self Care | Payer: Self-pay | Source: Home / Self Care | Attending: General Surgery

## 2021-01-30 ENCOUNTER — Ambulatory Visit (HOSPITAL_COMMUNITY)
Admission: RE | Admit: 2021-01-30 | Discharge: 2021-01-30 | Disposition: A | Discharge status: Home / Self Care | Payer: BC Managed Care – PPO | Attending: General Surgery | Admitting: General Surgery

## 2021-01-30 DIAGNOSIS — C50811 Malignant neoplasm of overlapping sites of right female breast: Secondary | ICD-10-CM | POA: Insufficient documentation

## 2021-01-30 DIAGNOSIS — Z510 Encounter for antineoplastic radiation therapy: Secondary | ICD-10-CM | POA: Diagnosis not present

## 2021-01-30 DIAGNOSIS — Z881 Allergy status to other antibiotic agents status: Secondary | ICD-10-CM | POA: Insufficient documentation

## 2021-01-30 DIAGNOSIS — X58XXXA Exposure to other specified factors, initial encounter: Secondary | ICD-10-CM | POA: Diagnosis not present

## 2021-01-30 DIAGNOSIS — Z79899 Other long term (current) drug therapy: Secondary | ICD-10-CM | POA: Insufficient documentation

## 2021-01-30 DIAGNOSIS — Z885 Allergy status to narcotic agent status: Secondary | ICD-10-CM | POA: Insufficient documentation

## 2021-01-30 DIAGNOSIS — T82898A Other specified complication of vascular prosthetic devices, implants and grafts, initial encounter: Secondary | ICD-10-CM | POA: Insufficient documentation

## 2021-01-30 DIAGNOSIS — Z452 Encounter for adjustment and management of vascular access device: Secondary | ICD-10-CM | POA: Diagnosis not present

## 2021-01-30 DIAGNOSIS — Z803 Family history of malignant neoplasm of breast: Secondary | ICD-10-CM | POA: Diagnosis not present

## 2021-01-30 DIAGNOSIS — F419 Anxiety disorder, unspecified: Secondary | ICD-10-CM | POA: Diagnosis not present

## 2021-01-30 DIAGNOSIS — Z95828 Presence of other vascular implants and grafts: Secondary | ICD-10-CM

## 2021-01-30 DIAGNOSIS — Z880 Allergy status to penicillin: Secondary | ICD-10-CM | POA: Diagnosis not present

## 2021-01-30 DIAGNOSIS — Z170 Estrogen receptor positive status [ER+]: Secondary | ICD-10-CM | POA: Diagnosis not present

## 2021-01-30 HISTORY — PX: PORT-A-CATH REMOVAL: SHX5289

## 2021-01-30 SURGERY — REMOVAL PORT-A-CATH
Anesthesia: Monitor Anesthesia Care | Site: Chest | Laterality: Left

## 2021-01-30 MED ORDER — DEXAMETHASONE SODIUM PHOSPHATE 10 MG/ML IJ SOLN
INTRAMUSCULAR | Status: AC
  Filled 2021-01-30: qty 1

## 2021-01-30 MED ORDER — ACETAMINOPHEN 500 MG PO TABS
1000.0000 mg | ORAL_TABLET | Freq: Once | ORAL | Status: AC
  Administered 2021-01-30: 1000 mg via ORAL
  Filled 2021-01-30: qty 2

## 2021-01-30 MED ORDER — ONDANSETRON HCL 4 MG/2ML IJ SOLN
INTRAMUSCULAR | Status: DC | PRN
  Administered 2021-01-30: 4 mg via INTRAVENOUS

## 2021-01-30 MED ORDER — ORAL CARE MOUTH RINSE: 15.0000 mL | Freq: Once | OROMUCOSAL | Status: AC

## 2021-01-30 MED ORDER — CHLORHEXIDINE GLUCONATE 0.12 % MT SOLN
15.0000 mL | Freq: Once | OROMUCOSAL | Status: AC
  Administered 2021-01-30: 15 mL via OROMUCOSAL
  Filled 2021-01-30: qty 15

## 2021-01-30 MED ORDER — BUPIVACAINE-EPINEPHRINE (PF) 0.25% -1:200000 IJ SOLN
INTRAMUSCULAR | Status: AC
  Filled 2021-01-30: qty 30

## 2021-01-30 MED ORDER — MIDAZOLAM HCL 5 MG/5ML IJ SOLN
INTRAMUSCULAR | Status: DC | PRN
  Administered 2021-01-30: 2 mg via INTRAVENOUS

## 2021-01-30 MED ORDER — FENTANYL CITRATE (PF) 250 MCG/5ML IJ SOLN
INTRAMUSCULAR | Status: AC
  Filled 2021-01-30: qty 5

## 2021-01-30 MED ORDER — FENTANYL CITRATE (PF) 250 MCG/5ML IJ SOLN
INTRAMUSCULAR | Status: DC | PRN
  Administered 2021-01-30: 50 ug via INTRAVENOUS
  Administered 2021-01-30: 25 ug via INTRAVENOUS

## 2021-01-30 MED ORDER — PROPOFOL 10 MG/ML IV BOLUS
INTRAVENOUS | Status: AC
  Filled 2021-01-30: qty 20

## 2021-01-30 MED ORDER — FENTANYL CITRATE (PF) 100 MCG/2ML IJ SOLN: 25.0000 ug | INTRAMUSCULAR | Status: DC | PRN

## 2021-01-30 MED ORDER — LACTATED RINGERS IV SOLN: INTRAVENOUS | Status: DC | PRN

## 2021-01-30 MED ORDER — LACTATED RINGERS IV SOLN: INTRAVENOUS | Status: DC

## 2021-01-30 MED ORDER — PROPOFOL 500 MG/50ML IV EMUL
INTRAVENOUS | Status: DC | PRN
  Administered 2021-01-30: 75 ug/kg/min via INTRAVENOUS

## 2021-01-30 MED ORDER — ONDANSETRON HCL 4 MG/2ML IJ SOLN
INTRAMUSCULAR | Status: AC
  Filled 2021-01-30: qty 2

## 2021-01-30 MED ORDER — BUPIVACAINE-EPINEPHRINE 0.25% -1:200000 IJ SOLN
INTRAMUSCULAR | Status: DC | PRN
  Administered 2021-01-30: 6 mL

## 2021-01-30 MED ORDER — DEXAMETHASONE SODIUM PHOSPHATE 10 MG/ML IJ SOLN
INTRAMUSCULAR | Status: DC | PRN
  Administered 2021-01-30: 5 mg via INTRAVENOUS

## 2021-01-30 MED ORDER — LIDOCAINE 2% (20 MG/ML) 5 ML SYRINGE
INTRAMUSCULAR | Status: AC
  Filled 2021-01-30: qty 5

## 2021-01-30 MED ORDER — 0.9 % SODIUM CHLORIDE (POUR BTL) OPTIME
TOPICAL | Status: DC | PRN
  Administered 2021-01-30: 1000 mL

## 2021-01-30 MED ORDER — PROPOFOL 10 MG/ML IV BOLUS
INTRAVENOUS | Status: DC | PRN
  Administered 2021-01-30: 10 mg via INTRAVENOUS

## 2021-01-30 MED ORDER — MIDAZOLAM HCL 2 MG/2ML IJ SOLN
INTRAMUSCULAR | Status: AC
  Filled 2021-01-30: qty 2

## 2021-01-30 SURGICAL SUPPLY — 33 items
ADH SKN CLS APL DERMABOND .7 (GAUZE/BANDAGES/DRESSINGS) ×1
BAG COUNTER SPONGE SURGICOUNT (BAG) ×2 IMPLANT
BAG SPNG CNTER NS LX DISP (BAG) ×1
CHLORAPREP W/TINT 10.5 ML (MISCELLANEOUS) ×2 IMPLANT
COVER SURGICAL LIGHT HANDLE (MISCELLANEOUS) ×2 IMPLANT
DECANTER SPIKE VIAL GLASS SM (MISCELLANEOUS) IMPLANT
DERMABOND ADVANCED (GAUZE/BANDAGES/DRESSINGS) ×1
DERMABOND ADVANCED .7 DNX12 (GAUZE/BANDAGES/DRESSINGS) ×1 IMPLANT
DRAPE LAPAROTOMY 100X72 PEDS (DRAPES) ×2 IMPLANT
DRSG TEGADERM 2-3/8X2-3/4 SM (GAUZE/BANDAGES/DRESSINGS) ×2 IMPLANT
ELECT CAUTERY BLADE 6.4 (BLADE) ×2 IMPLANT
ELECT REM PT RETURN 9FT ADLT (ELECTROSURGICAL) ×2
ELECTRODE REM PT RTRN 9FT ADLT (ELECTROSURGICAL) ×1 IMPLANT
GAUZE 4X4 16PLY ~~LOC~~+RFID DBL (SPONGE) ×2 IMPLANT
GLOVE SURG ENC MOIS LTX SZ7 (GLOVE) ×2 IMPLANT
GLOVE SURG UNDER POLY LF SZ7.5 (GLOVE) ×2 IMPLANT
GOWN STRL REUS W/ TWL LRG LVL3 (GOWN DISPOSABLE) ×2 IMPLANT
GOWN STRL REUS W/TWL LRG LVL3 (GOWN DISPOSABLE) ×4
KIT BASIN OR (CUSTOM PROCEDURE TRAY) ×2 IMPLANT
KIT TURNOVER KIT B (KITS) ×2 IMPLANT
NEEDLE HYPO 25GX1X1/2 BEV (NEEDLE) ×2 IMPLANT
NS IRRIG 1000ML POUR BTL (IV SOLUTION) ×2 IMPLANT
PACK GENERAL/GYN (CUSTOM PROCEDURE TRAY) ×2 IMPLANT
PAD ARMBOARD 7.5X6 YLW CONV (MISCELLANEOUS) ×4 IMPLANT
PENCIL SMOKE EVACUATOR (MISCELLANEOUS) ×2 IMPLANT
SPONGE GAUZE 2X2 8PLY STRL LF (GAUZE/BANDAGES/DRESSINGS) ×2 IMPLANT
STRIP CLOSURE SKIN 1/2X4 (GAUZE/BANDAGES/DRESSINGS) ×2 IMPLANT
SUT MNCRL AB 4-0 PS2 18 (SUTURE) ×2 IMPLANT
SUT VIC AB 3-0 SH 27 (SUTURE) ×2
SUT VIC AB 3-0 SH 27X BRD (SUTURE) ×1 IMPLANT
SYR CONTROL 10ML LL (SYRINGE) ×2 IMPLANT
TOWEL GREEN STERILE (TOWEL DISPOSABLE) ×2 IMPLANT
TOWEL GREEN STERILE FF (TOWEL DISPOSABLE) ×2 IMPLANT

## 2021-01-30 NOTE — Transfer of Care (Signed)
Immediate Anesthesia Transfer of Care Note  Patient: Teresa Sheppard  Procedure(s) Performed: PORT REMOVAL (Left: Chest)  Patient Location: PACU  Anesthesia Type:MAC  Level of Consciousness: awake, alert  and oriented  Airway & Oxygen Therapy: Patient Spontanous Breathing  Post-op Assessment: Report given to RN, Post -op Vital signs reviewed and stable and Patient moving all extremities X 4  Post vital signs: Reviewed and stable  Last Vitals:  Vitals Value Taken Time  BP 109/48 01/30/21 1029  Temp    Pulse 58 01/30/21 1029  Resp 16 01/30/21 1029  SpO2 97 % 01/30/21 1029  Vitals shown include unvalidated device data.  Last Pain:  Vitals:   01/30/21 0754  TempSrc:   PainSc: 0-No pain         Complications: No notable events documented.

## 2021-01-30 NOTE — Discharge Instructions (Signed)
    POST OP INSTRUCTIONS  Always review your discharge instruction sheet given to you by the facility where your surgery was performed.   A prescription for pain medication may be given to you upon discharge. Take your pain medication as prescribed, if needed. If narcotic pain medicine is not needed, then you make take acetaminophen (Tylenol) or ibuprofen (Advil) as needed.  Take your usually prescribed medications unless otherwise directed. If you need a refill on your pain medication, please contact our office. All narcotic pain medicine now requires a paper prescription.  Phoned in and fax refills are no longer allowed by law.  Prescriptions will not be filled after 5 pm or on weekends.  You should follow a light diet for the remainder of the day after your procedure. Most patients will experience some mild swelling and/or bruising in the area of the incision. It may take several days to resolve. It is common to experience some constipation if taking pain medication after surgery. Increasing fluid intake and taking a stool softener (such as Colace) will usually help or prevent this problem from occurring. A mild laxative (Milk of Magnesia or Miralax) should be taken according to package directions if there are no bowel movements after 48 hours.  Unless discharge instructions indicate otherwise, you may remove your bandages 48 hours after surgery, and you may shower at that time. You may have steri-strips (small white skin tapes) in place directly over the incision.  These strips should be left on the skin for 7-10 days.  If your surgeon used Dermabond (skin glue) on the incision, you may shower in 24 hours.  The glue will flake off over the next 2-3 weeks.   ACTIVITIES:  Limit activity involving your arms for the next 24 hours then return normal activity. You may drive when you are no longer taking prescription pain medication, you can comfortably wear a seatbelt, and you can maneuver your  car. 10.You may need to see your doctor in the office for a follow-up appointment.  Please       check with your doctor.    WHEN TO CALL YOUR DOCTOR [phone]): Fever over 101.0 Chills Continued bleeding from incision Increased redness and tenderness at the site Shortness of breath, difficulty breathing   The clinic staff is available to answer your questions during regular business hours. Please don't hesitate to call and ask to speak to one of the nurses or medical assistants for clinical concerns. If you have a medical emergency, go to the nearest emergency room or call 911.  A surgeon from [facility] is always on call at the hospital.     For further information, please visit [email or web address]

## 2021-01-30 NOTE — Anesthesia Preprocedure Evaluation (Addendum)
Anesthesia Evaluation  Patient identified by MRN, date of birth, ID band Patient awake    Reviewed: Allergy & Precautions, NPO status , Patient's Chart, lab work & pertinent test results  History of Anesthesia Complications (+) PONV  Airway Mallampati: III  TM Distance: >3 FB Neck ROM: Full  Mouth opening: Limited Mouth Opening  Dental no notable dental hx. (+) Teeth Intact, Dental Advisory Given   Pulmonary neg pulmonary ROS,    Pulmonary exam normal breath sounds clear to auscultation       Cardiovascular negative cardio ROS Normal cardiovascular exam Rhythm:Regular Rate:Normal  TTE 2022 1. Left ventricular ejection fraction, by estimation, is 55 to 60%. The  left ventricle has normal function. The left ventricle has no regional  wall motion abnormalities. Left ventricular diastolic parameters were  normal.  2. Right ventricular systolic function is normal. The right ventricular  size is normal. There is normal pulmonary artery systolic pressure.  3. The mitral valve is normal in structure. No evidence of mitral valve  regurgitation. No evidence of mitral stenosis.  4. The aortic valve is normal in structure. Aortic valve regurgitation is  not visualized. No aortic stenosis is present.  5. The inferior vena cava is normal in size with greater than 50%  respiratory variability, suggesting right atrial pressure of 3 mmHg.   Neuro/Psych PSYCHIATRIC DISORDERS Anxiety negative neurological ROS     GI/Hepatic negative GI ROS, Neg liver ROS,   Endo/Other  negative endocrine ROS  Renal/GU negative Renal ROS  negative genitourinary   Musculoskeletal negative musculoskeletal ROS (+)   Abdominal   Peds  Hematology negative hematology ROS (+)   Anesthesia Other Findings Right breast CA  Reproductive/Obstetrics                            Anesthesia Physical Anesthesia Plan  ASA:  2  Anesthesia Plan: MAC   Post-op Pain Management:    Induction: Intravenous  PONV Risk Score and Plan: 3 and Propofol infusion, Treatment may vary due to age or medical condition, Midazolam, Ondansetron and Dexamethasone  Airway Management Planned: Natural Airway  Additional Equipment:   Intra-op Plan:   Post-operative Plan:   Informed Consent: I have reviewed the patients History and Physical, chart, labs and discussed the procedure including the risks, benefits and alternatives for the proposed anesthesia with the patient or authorized representative who has indicated his/her understanding and acceptance.     Dental advisory given  Plan Discussed with: CRNA  Anesthesia Plan Comments:         Anesthesia Quick Evaluation

## 2021-01-30 NOTE — Interval H&P Note (Signed)
History and Physical Interval Note:  01/30/2021 6:55 AM  Teresa Sheppard  has presented today for surgery, with the diagnosis of BREAST CANCER.  The various methods of treatment have been discussed with the patient and family. After consideration of risks, benefits and other options for treatment, the patient has consented to  Procedure(s) with comments: PORT REMOVAL (N/A) - LOCAL as a surgical intervention.  The patient's history has been reviewed, patient examined, no change in status, stable for surgery.  I have reviewed the patient's chart and labs.  Questions were answered to the patient's satisfaction.     [name]

## 2021-01-30 NOTE — Anesthesia Procedure Notes (Signed)
Procedure Name: MAC Date/Time: 01/30/2021 10:00 AM Performed by: Harden Mo, CRNA Pre-anesthesia Checklist: Patient identified, Emergency Drugs available, Suction available and Patient being monitored Patient Re-evaluated:Patient Re-evaluated prior to induction Oxygen Delivery Method: Simple face mask Preoxygenation: Pre-oxygenation with 100% oxygen Induction Type: IV induction Placement Confirmation: positive ETCO2 and breath sounds checked- equal and bilateral Dental Injury: Teeth and Oropharynx as per pre-operative assessment

## 2021-01-30 NOTE — Op Note (Signed)
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  Indications: 67 yof with breast cancer s/p surgery, receiving radiation therapy, receiving kadcyla has exposed port. I discussed removal  Procedure: After informed consent obtained patient was taken to OR.  She was prepped and draped in standard sterile surgical fashion. Timeout was performed.  I infiltrated marcaine and reentered the old incision. I then removed the port and the line in their entirety.  I then obtained hemostasis. I removed the suture material and closed with 3-0 vicryl and 4-0 monocryl. Glue and steristrips applied. I applied a dressing. She tolerated this well and was transferred to pacu stable.

## 2021-01-30 NOTE — Anesthesia Postprocedure Evaluation (Signed)
Anesthesia Post Note  Patient: Teresa Sheppard  Procedure(s) Performed: PORT REMOVAL (Left: Chest)     Patient location during evaluation: PACU Anesthesia Type: MAC Level of consciousness: awake and alert Pain management: pain level controlled Vital Signs Assessment: post-procedure vital signs reviewed and stable Respiratory status: spontaneous breathing, nonlabored ventilation, respiratory function stable and patient connected to nasal cannula oxygen Cardiovascular status: stable and blood pressure returned to baseline Postop Assessment: no apparent nausea or vomiting Anesthetic complications: no   No notable events documented.  Last Vitals:  Vitals:   01/30/21 1030 01/30/21 1043  BP: (!) 109/48 123/64  Pulse: 60 (!) 54  Resp: 17 15  Temp: 36.5 C 36.5 C  SpO2: 96% 97%    Last Pain:  Vitals:   01/30/21 1043  TempSrc:   PainSc: 0-No pain                 Mykeisha Dysert L [name]

## 2021-01-30 NOTE — H&P (Signed)
  Teresa Sheppard is a 60 y.o. female who is seen today for port issue. She has noted color change over port and a "black line" It is still working. No real pain noted. She is here for evaluation. She also has a couple areas lateral to right mastectomy scar and inferior that are painful and nodular. .  Review of Systems: A complete review of systems was obtained from the patient. I have reviewed this information and discussed as appropriate with the patient. See HPI as well for other ROS.  Review of Systems  All other systems reviewed and are negative.   Medical History: Past Medical History:  Diagnosis Date   History of cancer   Patient Active Problem List  Diagnosis   Malignant neoplasm of overlapping sites of right breast in female, estrogen receptor positive (CMS-HCC)   Past Surgical History:  Procedure Laterality Date   APPENDECTOMY   MASTECTOMY    Allergies  Allergen Reactions   Other Unknown   P-Tuss [Hydrocodone-Potassium Guaiaco] Unknown   Shellfish Containing Products Unknown and Other (See Comments)  Per patient whenever she consumes a lot gives tightness of throat   Penicillins Rash and Unknown  Ancef tolerated well. No adverse reaction   Current Outpatient Medications on File Prior to Visit  Medication Sig Dispense Refill   calcium citrate-vitamin D3 (CITRACAL+D) 315 mg-5 mcg (200 unit) tablet Take 2 tablets by mouth 2 (two) times daily with meals   cholecalciferol (VITAMIN D3) 1000 unit tablet Take by mouth   Lactobacillus acidophilus (PROBIOTIC) 10 billion cell Cap as directed   letrozole (FEMARA) 2.5 mg tablet   loratadine (CLARITIN) 5 mg/5 mL solution Take by mouth   No current facility-administered medications on file prior to visit.   Family History  Problem Relation Age of Onset   Breast cancer Mother   Breast cancer Sister    Social History   Tobacco Use  Smoking Status Never Smoker  Smokeless Tobacco Never Used    Social History    Socioeconomic History   Marital status: Married  Tobacco Use   Smoking status: Never Smoker   Smokeless tobacco: Never Used  Substance and Sexual Activity   Alcohol use: Yes  Comment: not since 12/21, occasionally   Drug use: Never   Objective:   There were no vitals filed for this visit.  There is no height or weight on file to calculate BMI.  Physical Exam Constitutional:  Appearance: Normal appearance.  Chest:   Comments: Nodular swelling lateral to the right mastectomy scar and inferior, changes c/w radiotherapy Port with very thin skin, the black lines that have been seen are the actual port Neurological:  Mental Status: She is alert.     Assessment and Plan:  Diagnoses and all orders for this visit:  Malignant neoplasm of overlapping sites of right breast in female, estrogen receptor positive (CMS-HCC)  Her port is coming through her very thin skin. It needs to be removed. I discussed doing that on Monday in OR. She will take care of it as discussed until then. I will notify oncology and rad onc as well. She shouldn't have to delay radiotherapy and will need to discuss kadcyla. I dont want to put port on radiation side or replace one on the left now.

## 2021-01-31 ENCOUNTER — Encounter (HOSPITAL_COMMUNITY): Payer: Self-pay | Admitting: General Surgery

## 2021-01-31 ENCOUNTER — Ambulatory Visit
Admission: RE | Admit: 2021-01-31 | Discharge: 2021-01-31 | Disposition: A | Discharge status: Home / Self Care | Payer: BC Managed Care – PPO | Source: Ambulatory Visit | Attending: Radiation Oncology | Admitting: Radiation Oncology

## 2021-01-31 DIAGNOSIS — Z510 Encounter for antineoplastic radiation therapy: Secondary | ICD-10-CM | POA: Diagnosis not present

## 2021-01-31 DIAGNOSIS — Z170 Estrogen receptor positive status [ER+]: Secondary | ICD-10-CM | POA: Diagnosis not present

## 2021-01-31 DIAGNOSIS — C50811 Malignant neoplasm of overlapping sites of right female breast: Secondary | ICD-10-CM | POA: Diagnosis not present

## 2021-02-01 ENCOUNTER — Other Ambulatory Visit: Payer: Self-pay

## 2021-02-01 ENCOUNTER — Ambulatory Visit
Admission: RE | Admit: 2021-02-01 | Discharge: 2021-02-01 | Disposition: A | Discharge status: Home / Self Care | Payer: BC Managed Care – PPO | Source: Ambulatory Visit | Attending: Radiation Oncology | Admitting: Radiation Oncology

## 2021-02-01 DIAGNOSIS — C50811 Malignant neoplasm of overlapping sites of right female breast: Secondary | ICD-10-CM | POA: Diagnosis not present

## 2021-02-01 DIAGNOSIS — Z510 Encounter for antineoplastic radiation therapy: Secondary | ICD-10-CM | POA: Diagnosis not present

## 2021-02-01 DIAGNOSIS — Z170 Estrogen receptor positive status [ER+]: Secondary | ICD-10-CM | POA: Diagnosis not present

## 2021-02-02 ENCOUNTER — Ambulatory Visit
Admission: RE | Admit: 2021-02-02 | Discharge: 2021-02-02 | Disposition: A | Discharge status: Home / Self Care | Payer: BC Managed Care – PPO | Source: Ambulatory Visit | Attending: Radiation Oncology | Admitting: Radiation Oncology

## 2021-02-02 DIAGNOSIS — Z170 Estrogen receptor positive status [ER+]: Secondary | ICD-10-CM | POA: Diagnosis not present

## 2021-02-02 DIAGNOSIS — C50811 Malignant neoplasm of overlapping sites of right female breast: Secondary | ICD-10-CM

## 2021-02-02 DIAGNOSIS — Z510 Encounter for antineoplastic radiation therapy: Secondary | ICD-10-CM | POA: Diagnosis not present

## 2021-02-02 MED ORDER — RADIAPLEXRX EX GEL: Freq: Once | CUTANEOUS | Status: AC

## 2021-02-03 ENCOUNTER — Other Ambulatory Visit: Payer: Self-pay

## 2021-02-03 ENCOUNTER — Ambulatory Visit: Payer: BC Managed Care – PPO | Admitting: Radiation Oncology

## 2021-02-03 ENCOUNTER — Ambulatory Visit
Admission: RE | Admit: 2021-02-03 | Discharge: 2021-02-03 | Disposition: A | Discharge status: Home / Self Care | Payer: BC Managed Care – PPO | Source: Ambulatory Visit | Attending: Radiation Oncology | Admitting: Radiation Oncology

## 2021-02-03 DIAGNOSIS — C50811 Malignant neoplasm of overlapping sites of right female breast: Secondary | ICD-10-CM | POA: Diagnosis not present

## 2021-02-03 DIAGNOSIS — Z510 Encounter for antineoplastic radiation therapy: Secondary | ICD-10-CM | POA: Diagnosis not present

## 2021-02-03 DIAGNOSIS — Z170 Estrogen receptor positive status [ER+]: Secondary | ICD-10-CM | POA: Diagnosis not present

## 2021-02-06 ENCOUNTER — Ambulatory Visit
Admission: RE | Admit: 2021-02-06 | Discharge: 2021-02-06 | Disposition: A | Discharge status: Home / Self Care | Payer: BC Managed Care – PPO | Source: Ambulatory Visit | Attending: Radiation Oncology | Admitting: Radiation Oncology

## 2021-02-06 ENCOUNTER — Other Ambulatory Visit: Payer: Self-pay

## 2021-02-06 DIAGNOSIS — Z170 Estrogen receptor positive status [ER+]: Secondary | ICD-10-CM | POA: Diagnosis not present

## 2021-02-06 DIAGNOSIS — C50811 Malignant neoplasm of overlapping sites of right female breast: Secondary | ICD-10-CM | POA: Diagnosis not present

## 2021-02-06 DIAGNOSIS — Z510 Encounter for antineoplastic radiation therapy: Secondary | ICD-10-CM | POA: Diagnosis not present

## 2021-02-07 ENCOUNTER — Ambulatory Visit
Admission: RE | Admit: 2021-02-07 | Discharge: 2021-02-07 | Disposition: A | Discharge status: Home / Self Care | Payer: BC Managed Care – PPO | Source: Ambulatory Visit | Attending: Radiation Oncology | Admitting: Radiation Oncology

## 2021-02-07 ENCOUNTER — Other Ambulatory Visit: Payer: Self-pay

## 2021-02-07 DIAGNOSIS — C50811 Malignant neoplasm of overlapping sites of right female breast: Secondary | ICD-10-CM | POA: Diagnosis not present

## 2021-02-07 DIAGNOSIS — Z170 Estrogen receptor positive status [ER+]: Secondary | ICD-10-CM | POA: Diagnosis not present

## 2021-02-07 DIAGNOSIS — Z510 Encounter for antineoplastic radiation therapy: Secondary | ICD-10-CM | POA: Diagnosis not present

## 2021-02-07 NOTE — Assessment & Plan Note (Signed)
06/21/2020:Patient palpated a right breast mass x 1 wk. Mammogram showed multiple confluent masses in the right breast at the 7 o'clock position measuring 2.3cm and at the 9 o'clock position measuring 2.2cm with one right axillary lymph node with cortical thickening. Biopsy showed invasive mammary carcinoma at both positions and in the axilla, grade 2-3, HER-2 equivocal by IHC (2+), positive by FISH (ratio 3.34), ER+ >95%, PR+ 25%, Ki67 60%.  Treatment plan: 1. Neoadjuvant chemotherapy with [place] Perjeta 6 cyclescompleted 5/4/2022followed by HerceptinPerjeta versus Kadcylamaintenance for 1 year 2.Right mastectomy [name]): IDC, grade 3, spanning 4.0cm fibrotic area, clear margins, 1/4 right axillary lymph nodes positive for carcinoma, 0.5cm. Left lumpectomy: no evidence of malignancy 3. Followed by adjuvant radiation therapy 4.Followed by adjuvant antiestrogen therapy started 12/29/2020 5.Followed by neratinib ---------------------------------------------------------------------------------------------------------------------------------- Current Treatment: [name] Denies any toxicities to Kadcyla. Letrozole Toxicities:  RTC in 3 weeks

## 2021-02-07 NOTE — Progress Notes (Signed)
Patient Care Team: Wenda Low, MD as PCP - General (Internal Medicine) Rockwell Germany, RN as Oncology Nurse Navigator Mauro Kaufmann, RN as Oncology Nurse Navigator [name], MD as Consulting Physician (General Surgery) Nicholas Lose, MD as Consulting Physician (Hematology and Oncology) Kyung Rudd, MD as Consulting Physician (Radiation Oncology)  DIAGNOSIS:    ICD-10-CM   1. Malignant neoplasm of overlapping sites of right breast in female, estrogen receptor positive ([place])  C50.811 ECHOCARDIOGRAM COMPLETE   Z17.0       SUMMARY OF ONCOLOGIC HISTORY: Oncology History  Malignant neoplasm of overlapping sites of right breast in female, estrogen receptor positive ([place])  06/21/2020 Initial Diagnosis   Patient palpated a right breast mass x 1 wk. Mammogram showed multiple confluent masses in the right breast at the 7 o'clock position measuring 2.3cm and at the 9 o'clock position measuring 2.2cm with one right axillary lymph node with cortical thickening. Biopsy showed invasive mammary carcinoma at both positions and in the axilla, grade 2-3, HER-2 equivocal by IHC (2+), positive by FISH (ratio 3.34), ER+ >95%, PR+ 25%, Ki67 60%.   06/29/2020 Cancer Staging   Staging form: Breast, AJCC 8th Edition - Clinical stage from 06/29/2020: Stage IB (cT2, cN1(f), cM0, G3, ER+, PR+, HER2+) - Signed by Nicholas Lose, MD on 06/29/2020   07/13/2020 - 10/26/2020 Chemotherapy      Patient is on Antibody Plan: BREAST TRASTUZUMAB + PERTUZUMAB Q21D     10/28/2020 Breast MRI   Complete imaging response   11/14/2020 Genetic Testing   Negative hereditary cancer genetic testing: no pathogenic variants detected in [facility] BRCAPlus Panel and [facility] CancerNext-Expanded +RNAinsight Panel.  The report dates are Nov 14, 2020 and December 05, 2020, respectively.    The BRCAplus panel offered by [employer] and includes sequencing and deletion/duplication analysis for the following 8 genes: ATM, BRCA1, BRCA2,  CDH1, CHEK2, PALB2, PTEN, and TP53.  The CancerNext-Expanded gene panel offered by [facility] and includes sequencing, rearrangement, and RNA analysis for the following 77 genes: AIP, ALK, APC, ATM, AXIN2, BAP1, BARD1, BLM, BMPR1A, BRCA1, BRCA2, BRIP1, CDC73, CDH1, CDK4, CDKN1B, CDKN2A, CHEK2, CTNNA1, DICER1, FANCC, FH, FLCN, GALNT12, KIF1B, LZTR1, MAX, MEN1, MET, MLH1, MSH2, MSH3, MSH6, MUTYH, NBN, NF1, NF2, NTHL1, PALB2, PHOX2B, PMS2, POT1, PRKAR1A, PTCH1, PTEN, RAD51C, RAD51D, RB1, RECQL, RET, SDHA, SDHAF2, SDHB, SDHC, SDHD, SMAD4, SMARCA4, SMARCB1, SMARCE1, STK11, SUFU, TMEM127, TP53, TSC1, TSC2, VHL and XRCC2 (sequencing and deletion/duplication); EGFR, EGLN1, HOXB13, KIT, MITF, PDGFRA, POLD1, and POLE (sequencing only); EPCAM and GREM1 (deletion/duplication only).    11/28/2020 Surgery   Right mastectomy [name]): IDC, grade 3, spanning 4.0cm fibrotic area, clear margins, 1/4 right axillary lymph nodes positive for carcinoma, 0.5cm.   Left lumpectomy: no evidence of malignancy   12/07/2020 -  Chemotherapy    Patient is on Treatment Plan: BREAST ADO-TRASTUZUMAB EMTANSINE (KADCYLA) Q21D         CHIEF COMPLIANT: [name]  INTERVAL HISTORY: Teresa Sheppard is a 60 y.o. with above-mentioned history of right breast cancer who had a right mastectomy and completed neoadjuvant chemotherapy and with [place], and is currently on [name]. She reports to the clinic today for treatment.  Her major complaint today is related to scar tissue in the right breast and radiation dermatitis.  She has felt a couple of nodules and she had was evaluated by Dr. [name] who did not recommend any ultrasound at this time but to wait till radiation is complete.  She had 1 episode of  nausea with Kadcyla.  Does not have any worsening side effects.  Energy levels are decent.  ALLERGIES:  is allergic to shellfish-derived products and penicillins.  MEDICATIONS:  Current Outpatient  Medications  Medication Sig Dispense Refill   acetaminophen (TYLENOL) 325 MG tablet Take 325 mg by mouth every 6 (six) hours as needed (headaches / pain).     bimatoprost (LATISSE) 0.03 % ophthalmic solution Apply 1 drop to eye at bedtime. Place one drop on applicator and apply evenly along the skin of the upper eyelid at base of eyelashes once daily at bedtime; repeat procedure for second eye (use a clean applicator). 3 mL 12   Calcium-Vitamin D-Vitamin K ([employer] CALCIUM SOFT CHEWS) 650-12.5-40 MG-MCG-MCG CHEW Chew 1 tablet by mouth 2 (two) times daily.     cholecalciferol (VITAMIN D3) 25 MCG (1000 UNIT) tablet Take 1,000 Units by mouth daily.     Cranberry-Vitamin C 250-60 MG CAPS Take 1 tablet by mouth in the morning and at bedtime.     hydrocortisone cream 1 % Apply 1 application topically 4 (four) times daily as needed for itching.     letrozole (FEMARA) 2.5 MG tablet Take 1 tablet (2.5 mg total) by mouth daily. 90 tablet 3   lidocaine-prilocaine (EMLA) cream Apply to affected area once (Patient taking differently: Apply 1 application topically as needed (port access).) 30 g 3   loratadine (CLARITIN) 10 MG tablet Take 10 mg by mouth daily.     Multiple Vitamins-Minerals (HAIR/SKIN/NAILS/BIOTIN PO) Take 1 capsule by mouth daily.     Polyethyl Glycol-Propyl Glycol (SYSTANE OP) Place 1 drop into both eyes daily as needed (dry eyes).     Probiotic Product (ALIGN) 4 MG CAPS Take 4 mg by mouth daily.     Wound Cleansers (RADIAPLEX EX) Apply 1 application topically daily as needed (radiation).     No current facility-administered medications for this visit.    PHYSICAL EXAMINATION: ECOG PERFORMANCE STATUS: 1 - Symptomatic but completely ambulatory  Vitals:   02/08/21 1111  BP: 125/67  Pulse: 60  Resp: 18  Temp: (!) 97.3 F (36.3 C)  SpO2: 100%   Filed Weights   02/08/21 1111  Weight: 144 lb 4.8 oz (65.5 kg)     LABORATORY DATA:  I have reviewed the data as listed CMP Latest Ref Rng  & Units 01/18/2021 12/28/2020 12/05/2020  Glucose 70 - 99 mg/dL 98 99 101(H)  BUN 6 - 20 mg/dL 15 17 21(H)  Creatinine 0.44 - 1.00 mg/dL 0.69 0.71 0.76  Sodium 135 - 145 mmol/L 139 141 139  Potassium 3.5 - 5.1 mmol/L 4.0 3.7 4.4  Chloride 98 - 111 mmol/L 106 106 105  CO2 22 - 32 mmol/L _0 Calcium 8.9 - 10.3 mg/dL 9.6 9.6 9.2  Total Protein 6.5 - 8.1 g/dL 7.6 7.5 7.0  Total Bilirubin 0.3 - 1.2 mg/dL 0.3 0.3 <0.2(L)  Alkaline Phos 38 - 126 U/L 86 74 74  AST 15 - 41 U/L 46(H) 50(H) 25  ALT 0 - 44 U/L 31 42 18    Lab Results  Component Value Date   WBC 2.8 (L) 02/08/2021   HGB 12.0 02/08/2021   HCT 35.5 (L) 02/08/2021   MCV 93.2 02/08/2021   PLT 101 (L) 02/08/2021   NEUTROABS 1.5 (L) 02/08/2021    ASSESSMENT & PLAN:  Malignant neoplasm of overlapping sites of right breast in female, estrogen receptor positive ([place]) 06/21/2020:Patient palpated a right breast mass x 1 wk. Mammogram showed multiple  confluent masses in the right breast at the 7 o'clock position measuring 2.3cm and at the 9 o'clock position measuring 2.2cm with one right axillary lymph node with cortical thickening. Biopsy showed invasive mammary carcinoma at both positions and in the axilla, grade 2-3, HER-2 equivocal by IHC (2+), positive by FISH (ratio 3.34), ER+ >95%, PR+ 25%, Ki67 60%.   Treatment plan: 1. Neoadjuvant chemotherapy with [facility] Perjeta 6 cycles completed 10/26/2020 followed by Herceptin Perjeta versus [name] for 1 year 2.  Right mastectomy [name]): IDC, grade 3, spanning 4.0cm fibrotic area, clear margins, 1/4 right axillary lymph nodes positive for carcinoma, 0.5cm. Left lumpectomy: no evidence of malignancy 3. Followed by adjuvant radiation therapy 4.  Followed by adjuvant antiestrogen therapy started 12/29/2020 5.  Followed by neratinib ---------------------------------------------------------------------------------------------------------------------------------- Current Treatment:  [name] Kadcyla toxicities: Leukopenia: We will reduce the dosage of Kadcyla. Mild thrombocytopenia Elevation of AST and ALT: Will be monitored closely with reducing the dosage of Kadcyla.   Echocardiogram will be ordered. Radiation dermatitis Small palpable nodules in the right axilla and beneath the breasts: No further work-up necessary at this time.  After radiation dermatitis improves she might undergo ultrasound for further evaluation if necessary.  RTC in 3 weeks to discuss about new concept of fasting that she learnt     Orders Placed This Encounter  Procedures   ECHOCARDIOGRAM COMPLETE    Standing Status:   Future    Standing Expiration Date:   02/07/2022    Scheduling Instructions:     Please contact patient with date and time once procedure has been scheduled. Thank you.    Order Specific Question:   Where should this test be performed    Answer:   [facility]    Order Specific Question:   Perflutren DEFINITY (image enhancing agent) should be administered unless hypersensitivity or allergy exist    Answer:   Administer Perflutren    Order Specific Question:   Is a special reader required? (athlete or structural heart)    Answer:   No    Order Specific Question:   Does this study need to be read by the Structural team/Level 3 readers?    Answer:   No    Order Specific Question:   Reason for exam-Echo    Answer:   Chemo  Z09    Order Specific Question:   Other Comments    Answer:   Patient on Kadcyla, last ECHO 11/02/20   The patient has a good understanding of the overall plan. she agrees with it. she will call with any problems that may develop before the next visit here.  Total time spent: 30 mins including face to face time and time spent for planning, charting and coordination of care  Rulon Eisenmenger, MD, MPH 02/08/2021  I, Thana Ates, am acting as scribe for Dr. Nicholas Lose.  I have reviewed the above documentation for accuracy and completeness,  and I agree with the above.

## 2021-02-08 ENCOUNTER — Ambulatory Visit
Admission: RE | Admit: 2021-02-08 | Discharge: 2021-02-08 | Disposition: A | Discharge status: Home / Self Care | Payer: BC Managed Care – PPO | Source: Ambulatory Visit | Attending: Radiation Oncology | Admitting: Radiation Oncology

## 2021-02-08 ENCOUNTER — Inpatient Hospital Stay: Payer: BC Managed Care – PPO

## 2021-02-08 ENCOUNTER — Inpatient Hospital Stay: Payer: BC Managed Care – PPO | Attending: Hematology and Oncology | Admitting: Hematology and Oncology

## 2021-02-08 VITALS — BP 125/67 | HR 60 | Temp 97.3°F | Resp 18 | Ht 70.0 in | Wt 144.3 lb

## 2021-02-08 DIAGNOSIS — D6959 Other secondary thrombocytopenia: Secondary | ICD-10-CM | POA: Insufficient documentation

## 2021-02-08 DIAGNOSIS — Z510 Encounter for antineoplastic radiation therapy: Secondary | ICD-10-CM | POA: Diagnosis not present

## 2021-02-08 DIAGNOSIS — C50811 Malignant neoplasm of overlapping sites of right female breast: Secondary | ICD-10-CM | POA: Insufficient documentation

## 2021-02-08 DIAGNOSIS — Z170 Estrogen receptor positive status [ER+]: Secondary | ICD-10-CM

## 2021-02-08 DIAGNOSIS — Z5112 Encounter for antineoplastic immunotherapy: Secondary | ICD-10-CM | POA: Diagnosis not present

## 2021-02-08 LAB — COMPREHENSIVE METABOLIC PANEL
ALT: 52 U/L — ABNORMAL HIGH (ref 0–44)
AST: 66 U/L — ABNORMAL HIGH (ref 15–41)
Albumin: 3.8 g/dL (ref 3.5–5.0)
Alkaline Phosphatase: 101 U/L (ref 38–126)
Anion gap: 9 (ref 5–15)
BUN: 9 mg/dL (ref 6–20)
CO2: 25 mmol/L (ref 22–32)
Calcium: 9.7 mg/dL (ref 8.9–10.3)
Chloride: 108 mmol/L (ref 98–111)
Creatinine, Ser: 0.78 mg/dL (ref 0.44–1.00)
GFR, Estimated: >60 mL/min (ref >60)
Glucose, Bld: 84 mg/dL (ref 70–99)
Potassium: 4.1 mmol/L (ref 3.5–5.1)
Sodium: 142 mmol/L (ref 135–145)
Total Bilirubin: 0.5 mg/dL (ref 0.3–1.2)
Total Protein: 7.4 g/dL (ref 6.5–8.1)

## 2021-02-08 LAB — CBC WITH DIFFERENTIAL/PLATELET
Abs Immature Granulocytes: 0.01 10*3/uL (ref 0.00–0.07)
Basophils Absolute: 0 10*3/uL (ref 0.0–0.1)
Basophils Relative: 1 %
Eosinophils Absolute: 0.2 10*3/uL (ref 0.0–0.5)
Eosinophils Relative: 7 %
HCT: 35.5 % — ABNORMAL LOW (ref 36.0–46.0)
Hemoglobin: 12 g/dL (ref 12.0–15.0)
Immature Granulocytes: 0 %
Lymphocytes Relative: 22 %
Lymphs Abs: 0.6 10*3/uL — ABNORMAL LOW (ref 0.7–4.0)
MCH: 31.5 pg (ref 26.0–34.0)
MCHC: 33.8 g/dL (ref 30.0–36.0)
MCV: 93.2 fL (ref 80.0–100.0)
Monocytes Absolute: 0.4 10*3/uL (ref 0.1–1.0)
Monocytes Relative: 16 %
Neutro Abs: 1.5 10*3/uL — ABNORMAL LOW (ref 1.7–7.7)
Neutrophils Relative %: 54 %
Platelets: 101 10*3/uL — ABNORMAL LOW (ref 150–400)
RBC: 3.81 MIL/uL — ABNORMAL LOW (ref 3.87–5.11)
RDW: 12.2 % (ref 11.5–15.5)
WBC: 2.8 10*3/uL — ABNORMAL LOW (ref 4.0–10.5)
nRBC: 0 % (ref 0.0–0.2)

## 2021-02-08 MED ORDER — SODIUM CHLORIDE 0.9 % IV SOLN
3.0000 mg/kg | Freq: Once | INTRAVENOUS | Status: AC
  Administered 2021-02-08: 200 mg via INTRAVENOUS
  Filled 2021-02-08: qty 10

## 2021-02-08 MED ORDER — ACETAMINOPHEN 325 MG PO TABS
650.0000 mg | ORAL_TABLET | Freq: Once | ORAL | Status: AC
  Administered 2021-02-08: 650 mg via ORAL
  Filled 2021-02-08: qty 2

## 2021-02-08 MED ORDER — DIPHENHYDRAMINE HCL 25 MG PO CAPS
50.0000 mg | ORAL_CAPSULE | Freq: Once | ORAL | Status: AC
  Administered 2021-02-08: 50 mg via ORAL
  Filled 2021-02-08: qty 2

## 2021-02-08 MED ORDER — SODIUM CHLORIDE 0.9 % IV SOLN: Freq: Once | INTRAVENOUS | Status: AC

## 2021-02-08 NOTE — Patient Instructions (Signed)
[facility]  Discharge Instructions: Thank you for choosing [facility] to provide your oncology and hematology care.   If you have a lab appointment with the [facility], please go directly to the [facility] and check in at the registration area.   Wear comfortable clothing and clothing appropriate for easy access to any Portacath or PICC line.   We strive to give you quality time with your provider. You may need to reschedule your appointment if you arrive late (15 or more minutes).  Arriving late affects you and other patients whose appointments are after yours.  Also, if you miss three or more appointments without notifying the office, you may be dismissed from the clinic at the provider's discretion.      For prescription refill requests, have your pharmacy contact our office and allow 72 hours for refills to be completed.    Today you received the following chemotherapy and/or immunotherapy agents Ado-Trastuzumab       To help prevent nausea and vomiting after your treatment, we encourage you to take your nausea medication as directed.  BELOW ARE SYMPTOMS THAT SHOULD BE REPORTED IMMEDIATELY: *FEVER GREATER THAN 100.4 F (38 C) OR HIGHER *CHILLS OR SWEATING *NAUSEA AND VOMITING THAT IS NOT CONTROLLED WITH YOUR NAUSEA MEDICATION *UNUSUAL SHORTNESS OF BREATH *UNUSUAL BRUISING OR BLEEDING *URINARY PROBLEMS (pain or burning when urinating, or frequent urination) *BOWEL PROBLEMS (unusual diarrhea, constipation, pain near the anus) TENDERNESS IN MOUTH AND THROAT WITH OR WITHOUT PRESENCE OF ULCERS (sore throat, sores in mouth, or a toothache) UNUSUAL RASH, SWELLING OR PAIN  UNUSUAL VAGINAL DISCHARGE OR ITCHING   Items with * indicate a potential emergency and should be followed up as soon as possible or go to the Emergency Department if any problems should occur.  Please show the CHEMOTHERAPY ALERT CARD or IMMUNOTHERAPY ALERT CARD at  check-in to the Emergency Department and triage nurse.  Should you have questions after your visit or need to cancel or reschedule your appointment, please contact [facility]  Dept: [phone]  and follow the prompts.  Office hours are 8:00 a.m. to 4:30 p.m. Monday - Friday. Please note that voicemails left after 4:00 p.m. may not be returned until the following business day.  We are closed weekends and major holidays. You have access to a nurse at all times for urgent questions. Please call the main number to the clinic Dept: [phone] and follow the prompts.   For any non-urgent questions, you may also contact your provider using MyChart. We now offer e-Visits for anyone 63 and older to request care online for non-urgent symptoms. For details visit [email or web address].   Also download the MyChart app! Go to the app store, search "MyChart", open the app, select [facility], and log in with your MyChart username and password.  Due to Covid, a mask is required upon entering the hospital/clinic. If you do not have a mask, one will be given to you upon arrival. For doctor visits, patients may have 1 support person aged 61 or older with them. For treatment visits, patients cannot have anyone with them due to current Covid guidelines and our immunocompromised population.

## 2021-02-09 ENCOUNTER — Ambulatory Visit
Admission: RE | Admit: 2021-02-09 | Discharge: 2021-02-09 | Disposition: A | Discharge status: Home / Self Care | Payer: BC Managed Care – PPO | Source: Ambulatory Visit | Attending: Radiation Oncology | Admitting: Radiation Oncology

## 2021-02-09 ENCOUNTER — Other Ambulatory Visit: Payer: Self-pay

## 2021-02-09 DIAGNOSIS — Z510 Encounter for antineoplastic radiation therapy: Secondary | ICD-10-CM | POA: Diagnosis not present

## 2021-02-09 DIAGNOSIS — C50811 Malignant neoplasm of overlapping sites of right female breast: Secondary | ICD-10-CM | POA: Diagnosis not present

## 2021-02-09 DIAGNOSIS — Z170 Estrogen receptor positive status [ER+]: Secondary | ICD-10-CM | POA: Diagnosis not present

## 2021-02-10 ENCOUNTER — Ambulatory Visit: Payer: BC Managed Care – PPO | Admitting: Radiation Oncology

## 2021-02-10 ENCOUNTER — Ambulatory Visit
Admission: RE | Admit: 2021-02-10 | Discharge: 2021-02-10 | Disposition: A | Discharge status: Home / Self Care | Payer: BC Managed Care – PPO | Source: Ambulatory Visit | Attending: Radiation Oncology | Admitting: Radiation Oncology

## 2021-02-10 DIAGNOSIS — Z170 Estrogen receptor positive status [ER+]: Secondary | ICD-10-CM | POA: Diagnosis not present

## 2021-02-10 DIAGNOSIS — Z510 Encounter for antineoplastic radiation therapy: Secondary | ICD-10-CM | POA: Diagnosis not present

## 2021-02-10 DIAGNOSIS — C50811 Malignant neoplasm of overlapping sites of right female breast: Secondary | ICD-10-CM | POA: Diagnosis not present

## 2021-02-13 ENCOUNTER — Ambulatory Visit
Admission: RE | Admit: 2021-02-13 | Discharge: 2021-02-13 | Disposition: A | Discharge status: Home / Self Care | Payer: BC Managed Care – PPO | Source: Ambulatory Visit | Attending: Radiation Oncology | Admitting: Radiation Oncology

## 2021-02-13 ENCOUNTER — Other Ambulatory Visit: Payer: Self-pay

## 2021-02-13 DIAGNOSIS — Z510 Encounter for antineoplastic radiation therapy: Secondary | ICD-10-CM | POA: Diagnosis not present

## 2021-02-13 DIAGNOSIS — C50811 Malignant neoplasm of overlapping sites of right female breast: Secondary | ICD-10-CM | POA: Diagnosis not present

## 2021-02-13 DIAGNOSIS — Z170 Estrogen receptor positive status [ER+]: Secondary | ICD-10-CM | POA: Diagnosis not present

## 2021-02-14 ENCOUNTER — Ambulatory Visit
Admission: RE | Admit: 2021-02-14 | Discharge: 2021-02-14 | Disposition: A | Discharge status: Home / Self Care | Payer: BC Managed Care – PPO | Source: Ambulatory Visit | Attending: Radiation Oncology | Admitting: Radiation Oncology

## 2021-02-14 DIAGNOSIS — Z510 Encounter for antineoplastic radiation therapy: Secondary | ICD-10-CM | POA: Diagnosis not present

## 2021-02-14 DIAGNOSIS — Z170 Estrogen receptor positive status [ER+]: Secondary | ICD-10-CM | POA: Diagnosis not present

## 2021-02-14 DIAGNOSIS — C50811 Malignant neoplasm of overlapping sites of right female breast: Secondary | ICD-10-CM | POA: Diagnosis not present

## 2021-02-15 ENCOUNTER — Other Ambulatory Visit: Payer: Self-pay

## 2021-02-15 ENCOUNTER — Ambulatory Visit
Admission: RE | Admit: 2021-02-15 | Discharge: 2021-02-15 | Disposition: A | Discharge status: Home / Self Care | Payer: BC Managed Care – PPO | Source: Ambulatory Visit | Attending: Radiation Oncology | Admitting: Radiation Oncology

## 2021-02-15 ENCOUNTER — Encounter: Payer: Self-pay | Admitting: *Deleted

## 2021-02-15 DIAGNOSIS — Z510 Encounter for antineoplastic radiation therapy: Secondary | ICD-10-CM | POA: Diagnosis not present

## 2021-02-15 DIAGNOSIS — Z170 Estrogen receptor positive status [ER+]: Secondary | ICD-10-CM | POA: Diagnosis not present

## 2021-02-15 DIAGNOSIS — C50811 Malignant neoplasm of overlapping sites of right female breast: Secondary | ICD-10-CM | POA: Diagnosis not present

## 2021-02-16 ENCOUNTER — Ambulatory Visit: Payer: BC Managed Care – PPO

## 2021-02-16 ENCOUNTER — Ambulatory Visit
Admission: RE | Admit: 2021-02-16 | Discharge: 2021-02-16 | Disposition: A | Discharge status: Home / Self Care | Payer: BC Managed Care – PPO | Source: Ambulatory Visit | Attending: Radiation Oncology | Admitting: Radiation Oncology

## 2021-02-16 DIAGNOSIS — Z510 Encounter for antineoplastic radiation therapy: Secondary | ICD-10-CM | POA: Diagnosis not present

## 2021-02-16 DIAGNOSIS — C50811 Malignant neoplasm of overlapping sites of right female breast: Secondary | ICD-10-CM | POA: Diagnosis not present

## 2021-02-16 DIAGNOSIS — Z170 Estrogen receptor positive status [ER+]: Secondary | ICD-10-CM | POA: Diagnosis not present

## 2021-02-17 ENCOUNTER — Ambulatory Visit
Admission: RE | Admit: 2021-02-17 | Discharge: 2021-02-17 | Disposition: A | Discharge status: Home / Self Care | Payer: BC Managed Care – PPO | Source: Ambulatory Visit | Attending: Radiation Oncology | Admitting: Radiation Oncology

## 2021-02-17 ENCOUNTER — Encounter: Payer: Self-pay | Admitting: Radiation Oncology

## 2021-02-17 ENCOUNTER — Ambulatory Visit: Payer: BC Managed Care – PPO

## 2021-02-17 ENCOUNTER — Other Ambulatory Visit: Payer: Self-pay

## 2021-02-17 DIAGNOSIS — Z170 Estrogen receptor positive status [ER+]: Secondary | ICD-10-CM

## 2021-02-17 DIAGNOSIS — C50811 Malignant neoplasm of overlapping sites of right female breast: Secondary | ICD-10-CM

## 2021-02-17 DIAGNOSIS — Z510 Encounter for antineoplastic radiation therapy: Secondary | ICD-10-CM | POA: Diagnosis not present

## 2021-02-17 MED ORDER — RADIAPLEXRX EX GEL: Freq: Once | CUTANEOUS | Status: AC

## 2021-02-20 ENCOUNTER — Encounter: Payer: Self-pay | Admitting: Hematology and Oncology

## 2021-02-20 NOTE — Progress Notes (Signed)
                                                                                                                                                             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 She developed fatigue and anticipated skin changes in the treatment field.   Plan: The patient will receive a call in about one month from the radiation oncology department. She will continue follow up with [name] as well.   ________________________________________________    Carola Rhine, [facility]

## 2021-02-22 ENCOUNTER — Telehealth: Payer: Self-pay | Admitting: Hematology and Oncology

## 2021-02-22 ENCOUNTER — Other Ambulatory Visit: Payer: Self-pay | Admitting: *Deleted

## 2021-02-22 ENCOUNTER — Telehealth: Payer: Self-pay | Admitting: *Deleted

## 2021-02-22 DIAGNOSIS — Z0000 Encounter for general adult medical examination without abnormal findings: Secondary | ICD-10-CM | POA: Diagnosis not present

## 2021-02-22 DIAGNOSIS — Z170 Estrogen receptor positive status [ER+]: Secondary | ICD-10-CM

## 2021-02-22 DIAGNOSIS — C50811 Malignant neoplasm of overlapping sites of right female breast: Secondary | ICD-10-CM

## 2021-02-22 NOTE — Telephone Encounter (Signed)
Scheduled per sch msg. Called and left msg  

## 2021-02-22 NOTE — Telephone Encounter (Signed)
Mychart message sent.

## 2021-02-23 ENCOUNTER — Ambulatory Visit: Payer: BC Managed Care – PPO

## 2021-02-23 NOTE — Progress Notes (Signed)
Nutrition Follow-up:  Patient with breast cancer followed by Dr Lindi Adie.  Patient s/p right mastectomy and completed neoadjuvant chemotherapy with [place] perjeta and currently on kadcyla maintenance.  Patient has also just completed radiation.  Called and spoke with patient regarding intermittent fasting.  Patient reports fatigue from recent radiation.  Overall tolerating kadcyla without major nutrition impact symptoms.  Consuming a plant based diet mostly but not vegan including fish and other animal products.  Limiting added sugars as well.      Medications: reviewed  Labs: reviewed  Anthropometrics:   Height: 70 inches Weight: 144 lb 4.8 oz on 8/17 7/6 147 lb 11.2 oz 6/6 148 lb 5/9 147 lb  BMI: 20  2% weight loss in the last month and half   Estimated Energy Needs  Kcals: [id number] calories/d Protein: 78-98 g/d Fluid: > 1.6 L  NUTRITION DIAGNOSIS: Food and nutrition related knowledge deficit related to breast cancer as evidenced by questions regarding intermittent fasting.   INTERVENTION:  Discussed with patient current guideline according to WHEL study of patients with breast cancer who fasted more than 13 hours at night had decreased rate of breast cancer recurrence, compared with those who fasted for less than 13 hours per night. ([place] C, Nelson S, Breen C, et al. Prolonged nightly fasting and breast cancer prognosis. JAMA Oncol. 2016:2(8):1049-1055.doi:10.1001/jamaoncol.2016.0164.    Discussed concern with many of studies involving intermittent fasting currently are done on animal modules and few on humans.   Reviewed guidelines for food and lifestyle advice for patients with breast cancer per Oncology Nutrition for Clinical Practice.  Discussed with patient concern that intermittent fasting may result in weight loss and malnutrition.  Would discourage intermittent fasting if weight loss occurs and/or if start having nutrition impact symptoms related to treatment.  Patient  verbalized understanding. Encouraged patient to keep food diary and track recommended calories and protein to ensure meeting daily goals for weight maintenance and overall good nutrition.  Will mail intermittent fasting information including references per Oncology for Clinical Practice.   Contact information provided    MONITORING, EVALUATION, GOAL: weight trends, intake   NEXT VISIT: Tuesday, Sept 13 with Dory Peru (patient preferred to keep this appointment)  Cole Klugh B. Zenia Resides, [place], [place] Registered Dietitian [phone] (mobile)

## 2021-02-24 ENCOUNTER — Other Ambulatory Visit: Payer: Self-pay

## 2021-02-24 ENCOUNTER — Ambulatory Visit (HOSPITAL_COMMUNITY)
Admission: RE | Admit: 2021-02-24 | Discharge: 2021-02-24 | Disposition: A | Discharge status: Home / Self Care | Payer: BC Managed Care – PPO | Source: Ambulatory Visit | Attending: Hematology and Oncology | Admitting: Hematology and Oncology

## 2021-02-24 DIAGNOSIS — C50811 Malignant neoplasm of overlapping sites of right female breast: Secondary | ICD-10-CM | POA: Insufficient documentation

## 2021-02-24 DIAGNOSIS — Z0189 Encounter for other specified special examinations: Secondary | ICD-10-CM

## 2021-02-24 DIAGNOSIS — Z170 Estrogen receptor positive status [ER+]: Secondary | ICD-10-CM | POA: Diagnosis not present

## 2021-02-24 LAB — ECHOCARDIOGRAM COMPLETE
AR max vel: 1.37 cm2
AV Area VTI: 1.34 cm2
AV Area mean vel: 1.32 cm2
AV Mean grad: 4 mmHg
AV Peak grad: 7.1 mmHg
Ao pk vel: 1.33 m/s
Area-P 1/2: 4 cm2
S' Lateral: 2.6 cm
Single Plane A4C EF: 66.4 %

## 2021-02-28 NOTE — Progress Notes (Signed)
Patient Care Team: Wenda Low, MD as PCP - General (Internal Medicine) Rockwell Germany, RN as Oncology Nurse Navigator Mauro Kaufmann, RN as Oncology Nurse Navigator [name], MD as Consulting Physician (General Surgery) Nicholas Lose, MD as Consulting Physician (Hematology and Oncology) Kyung Rudd, MD as Consulting Physician (Radiation Oncology)  DIAGNOSIS:    ICD-10-CM   1. Malignant neoplasm of overlapping sites of right breast in female, estrogen receptor positive ([place])  C50.811    Z17.0       SUMMARY OF ONCOLOGIC HISTORY: Oncology History  Malignant neoplasm of overlapping sites of right breast in female, estrogen receptor positive ([place])  06/21/2020 Initial Diagnosis   Patient palpated a right breast mass x 1 wk. Mammogram showed multiple confluent masses in the right breast at the 7 o'clock position measuring 2.3cm and at the 9 o'clock position measuring 2.2cm with one right axillary lymph node with cortical thickening. Biopsy showed invasive mammary carcinoma at both positions and in the axilla, grade 2-3, HER-2 equivocal by IHC (2+), positive by FISH (ratio 3.34), ER+ >95%, PR+ 25%, Ki67 60%.   06/29/2020 Cancer Staging   Staging form: Breast, AJCC 8th Edition - Clinical stage from 06/29/2020: Stage IB (cT2, cN1(f), cM0, G3, ER+, PR+, HER2+) - Signed by Nicholas Lose, MD on 06/29/2020   07/13/2020 - 10/26/2020 Chemotherapy      Patient is on Antibody Plan: BREAST TRASTUZUMAB + PERTUZUMAB Q21D     10/28/2020 Breast MRI   Complete imaging response   11/14/2020 Genetic Testing   Negative hereditary cancer genetic testing: no pathogenic variants detected in [facility] BRCAPlus Panel and [facility] CancerNext-Expanded +RNAinsight Panel.  The report dates are Nov 14, 2020 and December 05, 2020, respectively.    The BRCAplus panel offered by [employer] and includes sequencing and deletion/duplication analysis for the following 8 genes: ATM, BRCA1, BRCA2, CDH1, CHEK2, PALB2, PTEN,  and TP53.  The CancerNext-Expanded gene panel offered by [facility] and includes sequencing, rearrangement, and RNA analysis for the following 77 genes: AIP, ALK, APC, ATM, AXIN2, BAP1, BARD1, BLM, BMPR1A, BRCA1, BRCA2, BRIP1, CDC73, CDH1, CDK4, CDKN1B, CDKN2A, CHEK2, CTNNA1, DICER1, FANCC, FH, FLCN, GALNT12, KIF1B, LZTR1, MAX, MEN1, MET, MLH1, MSH2, MSH3, MSH6, MUTYH, NBN, NF1, NF2, NTHL1, PALB2, PHOX2B, PMS2, POT1, PRKAR1A, PTCH1, PTEN, RAD51C, RAD51D, RB1, RECQL, RET, SDHA, SDHAF2, SDHB, SDHC, SDHD, SMAD4, SMARCA4, SMARCB1, SMARCE1, STK11, SUFU, TMEM127, TP53, TSC1, TSC2, VHL and XRCC2 (sequencing and deletion/duplication); EGFR, EGLN1, HOXB13, KIT, MITF, PDGFRA, POLD1, and POLE (sequencing only); EPCAM and GREM1 (deletion/duplication only).    11/28/2020 Surgery   Right mastectomy Teresa Sheppard): IDC, grade 3, spanning 4.0cm fibrotic area, clear margins, 1/4 right axillary lymph nodes positive for carcinoma, 0.5cm.   Left lumpectomy: no evidence of malignancy   12/07/2020 -  Chemotherapy    Patient is on Treatment Plan: BREAST ADO-TRASTUZUMAB EMTANSINE [facility]) Q21D       01/04/2021 - 02/17/2021 Radiation Therapy   Adjuvant radiation     CHIEF COMPLIANT: [name]  INTERVAL HISTORY: Teresa Sheppard is a 60 y.o. with above-mentioned history of right breast cancer who had a right mastectomy and completed neoadjuvant chemotherapy and with [place], and is currently on [name]. She reports to the clinic today for treatment.  She reports no major problems or concerns with Kadcyla.  She is recovering very well from the effects of radiation.  She does feel fatigued.  Denies any nausea or vomiting.  ALLERGIES:  is allergic to shellfish-derived products and penicillins.  MEDICATIONS:  Current Outpatient Medications  Medication Sig Dispense Refill   acetaminophen (TYLENOL) 325 MG tablet Take 325 mg by mouth every 6 (six) hours as needed (headaches / pain).     bimatoprost  (LATISSE) 0.03 % ophthalmic solution Apply 1 drop to eye at bedtime. Place one drop on applicator and apply evenly along the skin of the upper eyelid at base of eyelashes once daily at bedtime; repeat procedure for second eye (use a clean applicator). 3 mL 12   Calcium-Vitamin D-Vitamin K ([employer] CALCIUM SOFT CHEWS) 650-12.5-40 MG-MCG-MCG CHEW Chew 1 tablet by mouth 2 (two) times daily.     cholecalciferol (VITAMIN D3) 25 MCG (1000 UNIT) tablet Take 1,000 Units by mouth daily.     Cranberry-Vitamin C 250-60 MG CAPS Take 1 tablet by mouth in the morning and at bedtime.     hydrocortisone cream 1 % Apply 1 application topically 4 (four) times daily as needed for itching.     letrozole (FEMARA) 2.5 MG tablet Take 1 tablet (2.5 mg total) by mouth daily. 90 tablet 3   lidocaine-prilocaine (EMLA) cream Apply to affected area once (Patient taking differently: Apply 1 application topically as needed (port access).) 30 g 3   loratadine (CLARITIN) 10 MG tablet Take 10 mg by mouth daily.     Multiple Vitamins-Minerals (HAIR/SKIN/NAILS/BIOTIN PO) Take 1 capsule by mouth daily.     Polyethyl Glycol-Propyl Glycol (SYSTANE OP) Place 1 drop into both eyes daily as needed (dry eyes).     Probiotic Product (ALIGN) 4 MG CAPS Take 4 mg by mouth daily.     Wound Cleansers (RADIAPLEX EX) Apply 1 application topically daily as needed (radiation).     No current facility-administered medications for this visit.    PHYSICAL EXAMINATION: ECOG PERFORMANCE STATUS: 1 - Symptomatic but completely ambulatory  Vitals:   03/01/21 0921  BP: (!) 113/58  Pulse: 62  Resp: 18  Temp: 97.9 F (36.6 C)  SpO2: 100%   Filed Weights   03/01/21 0921  Weight: 143 lb 14.4 oz (65.3 kg)    LABORATORY DATA:  I have reviewed the data as listed CMP Latest Ref Rng & Units 02/08/2021 01/18/2021 12/28/2020  Glucose 70 - 99 mg/dL 84 98 99  BUN 6 - 20 mg/dL 9 15 17   Creatinine 0.44 - 1.00 mg/dL 0.78 0.69 0.71  Sodium 135 - 145 mmol/L 142  139 141  Potassium 3.5 - 5.1 mmol/L 4.1 4.0 3.7  Chloride 98 - 111 mmol/L 108 106 106  CO2 22 - 32 mmol/L 25 26 27   Calcium 8.9 - 10.3 mg/dL 9.7 9.6 9.6  Total Protein 6.5 - 8.1 g/dL 7.4 7.6 7.5  Total Bilirubin 0.3 - 1.2 mg/dL 0.5 0.3 0.3  Alkaline Phos 38 - 126 U/L 101 86 74  AST 15 - 41 U/L 66(H) 46(H) 50(H)  ALT 0 - 44 U/L 52(H) 31 42    Lab Results  Component Value Date   WBC 3.0 (L) 03/01/2021   HGB 13.2 03/01/2021   HCT 39.6 03/01/2021   MCV 94.3 03/01/2021   PLT 104 (L) 03/01/2021   NEUTROABS 1.8 03/01/2021    ASSESSMENT & PLAN:  Malignant neoplasm of overlapping sites of right breast in female, estrogen receptor positive ([place]) 06/21/2020:Patient palpated a right breast mass x 1 wk. Mammogram showed multiple confluent masses in the right breast at the 7 o'clock position measuring 2.3cm and at the 9 o'clock position measuring 2.2cm with one right axillary lymph node with cortical thickening. Biopsy showed  invasive mammary carcinoma at both positions and in the axilla, grade 2-3, HER-2 equivocal by IHC (2+), positive by FISH (ratio 3.34), ER+ >95%, PR+ 25%, Ki67 60%.   Treatment plan: 1. Neoadjuvant chemotherapy with [facility] Perjeta 6 cycles completed 10/26/2020 followed by Herceptin Perjeta versus [name] for 1 year 2.  Right mastectomy Teresa Sheppard): IDC, grade 3, spanning 4.0cm fibrotic area, clear margins, 1/4 right axillary lymph nodes positive for carcinoma, 0.5cm. Left lumpectomy: no evidence of malignancy 3. Followed by adjuvant radiation therapy completed 02/17/2021 4.  Followed by adjuvant antiestrogen therapy started 12/29/2020 5.  Followed by neratinib ---------------------------------------------------------------------------------------------------------------------------------- Current Treatment: [name] Kadcyla toxicities: Leukopenia: We reduced the dosage of Kadcyla. Mild thrombocytopenia: Stable Elevation of AST and ALT: Will be monitored  closely with reducing the dosage of Kadcyla.  These numbers are stable   Letrozole toxicities: Tolerating it extremely well. Return to clinic every 3 weeks for Kadcyla    No orders of the defined types were placed in this encounter.  The patient has a good understanding of the overall plan. she agrees with it. she will call with any problems that may develop before the next visit here.  Total time spent: 30 mins including face to face time and time spent for planning, charting and coordination of care  Rulon Eisenmenger, MD, MPH 03/01/2021  I, Thana Ates, am acting as scribe for Dr. Nicholas Lose.  I have reviewed the above documentation for accuracy and completeness, and I agree with the above.

## 2021-03-01 ENCOUNTER — Inpatient Hospital Stay: Payer: BC Managed Care – PPO | Attending: Hematology and Oncology

## 2021-03-01 ENCOUNTER — Other Ambulatory Visit: Payer: Self-pay | Admitting: Surgery

## 2021-03-01 ENCOUNTER — Other Ambulatory Visit: Payer: BC Managed Care – PPO

## 2021-03-01 ENCOUNTER — Encounter: Payer: Self-pay | Admitting: *Deleted

## 2021-03-01 ENCOUNTER — Other Ambulatory Visit: Payer: Self-pay

## 2021-03-01 ENCOUNTER — Inpatient Hospital Stay: Payer: BC Managed Care – PPO

## 2021-03-01 ENCOUNTER — Inpatient Hospital Stay (HOSPITAL_BASED_OUTPATIENT_CLINIC_OR_DEPARTMENT_OTHER): Payer: BC Managed Care – PPO | Admitting: Hematology and Oncology

## 2021-03-01 VITALS — BP 113/58 | HR 62 | Temp 97.9°F | Resp 18 | Ht 70.0 in | Wt 143.9 lb

## 2021-03-01 DIAGNOSIS — C50811 Malignant neoplasm of overlapping sites of right female breast: Secondary | ICD-10-CM | POA: Diagnosis not present

## 2021-03-01 DIAGNOSIS — E042 Nontoxic multinodular goiter: Secondary | ICD-10-CM

## 2021-03-01 DIAGNOSIS — D6959 Other secondary thrombocytopenia: Secondary | ICD-10-CM | POA: Insufficient documentation

## 2021-03-01 DIAGNOSIS — Z0000 Encounter for general adult medical examination without abnormal findings: Secondary | ICD-10-CM

## 2021-03-01 DIAGNOSIS — Z170 Estrogen receptor positive status [ER+]: Secondary | ICD-10-CM | POA: Insufficient documentation

## 2021-03-01 DIAGNOSIS — Z5112 Encounter for antineoplastic immunotherapy: Secondary | ICD-10-CM | POA: Diagnosis not present

## 2021-03-01 LAB — COMPREHENSIVE METABOLIC PANEL
ALT: 58 U/L — ABNORMAL HIGH (ref 0–44)
AST: 66 U/L — ABNORMAL HIGH (ref 15–41)
Albumin: 3.9 g/dL (ref 3.5–5.0)
Alkaline Phosphatase: 131 U/L — ABNORMAL HIGH (ref 38–126)
Anion gap: 11 (ref 5–15)
BUN: 11 mg/dL (ref 6–20)
CO2: 23 mmol/L (ref 22–32)
Calcium: 10 mg/dL (ref 8.9–10.3)
Chloride: 107 mmol/L (ref 98–111)
Creatinine, Ser: 0.81 mg/dL (ref 0.44–1.00)
GFR, Estimated: >60 mL/min (ref >60)
Glucose, Bld: 78 mg/dL (ref 70–99)
Potassium: 3.9 mmol/L (ref 3.5–5.1)
Sodium: 141 mmol/L (ref 135–145)
Total Bilirubin: 0.4 mg/dL (ref 0.3–1.2)
Total Protein: 7.9 g/dL (ref 6.5–8.1)

## 2021-03-01 LAB — CBC WITH DIFFERENTIAL/PLATELET
Abs Immature Granulocytes: 0.01 10*3/uL (ref 0.00–0.07)
Basophils Absolute: 0 10*3/uL (ref 0.0–0.1)
Basophils Relative: 1 %
Eosinophils Absolute: 0.2 10*3/uL (ref 0.0–0.5)
Eosinophils Relative: 6 %
HCT: 39.6 % (ref 36.0–46.0)
Hemoglobin: 13.2 g/dL (ref 12.0–15.0)
Immature Granulocytes: 0 %
Lymphocytes Relative: 20 %
Lymphs Abs: 0.6 10*3/uL — ABNORMAL LOW (ref 0.7–4.0)
MCH: 31.4 pg (ref 26.0–34.0)
MCHC: 33.3 g/dL (ref 30.0–36.0)
MCV: 94.3 fL (ref 80.0–100.0)
Monocytes Absolute: 0.3 10*3/uL (ref 0.1–1.0)
Monocytes Relative: 11 %
Neutro Abs: 1.8 10*3/uL (ref 1.7–7.7)
Neutrophils Relative %: 62 %
Platelets: 104 10*3/uL — ABNORMAL LOW (ref 150–400)
RBC: 4.2 MIL/uL (ref 3.87–5.11)
RDW: 12.3 % (ref 11.5–15.5)
WBC: 3 10*3/uL — ABNORMAL LOW (ref 4.0–10.5)
nRBC: 0 % (ref 0.0–0.2)

## 2021-03-01 MED ORDER — DIPHENHYDRAMINE HCL 25 MG PO CAPS
50.0000 mg | ORAL_CAPSULE | Freq: Once | ORAL | Status: AC
  Administered 2021-03-01: 50 mg via ORAL
  Filled 2021-03-01: qty 2

## 2021-03-01 MED ORDER — SODIUM CHLORIDE 0.9 % IV SOLN
3.0000 mg/kg | Freq: Once | INTRAVENOUS | Status: AC
  Administered 2021-03-01: 200 mg via INTRAVENOUS
  Filled 2021-03-01: qty 10

## 2021-03-01 MED ORDER — SODIUM CHLORIDE 0.9 % IV SOLN: Freq: Once | INTRAVENOUS | Status: AC

## 2021-03-01 MED ORDER — ACETAMINOPHEN 325 MG PO TABS
650.0000 mg | ORAL_TABLET | Freq: Once | ORAL | Status: AC
  Administered 2021-03-01: 650 mg via ORAL
  Filled 2021-03-01: qty 2

## 2021-03-01 NOTE — Patient Instructions (Signed)
[facility] MEDICAL [facility]COLOG[facility]  Discharge Instructions: °Thank you for choosing [facility] to provide your oncology and hematology care.  ° °If you have a lab appointment with the Cancer Center, please go directly to the Cancer Center and check in at the registration area. °  °Wear comfortable clothing and clothing appropriate for easy access to any Portacath or PICC line.  ° °We strive to give you quality time with your provider. You may need to reschedule your appointment if you arrive late (15 or more minutes).  Arriving late affects you and other patients whose appointments are after yours.  Also, if you miss three or more appointments without notifying the office, you may be dismissed from the clinic at the provider’s discretion.    °  °For prescription refill requests, have your pharmacy contact our office and allow 72 hours for refills to be completed.   ° °Today you received the following chemotherapy and/or immunotherapy agents kadcyla    °  °To help prevent nausea and vomiting after your treatment, we encourage you to take your nausea medication as directed. ° °BELOW ARE SYMPTOMS THAT SHOULD BE REPORTED IMMEDIATELY: °*FEVER GREATER THAN 100.4 F (38 °C) OR HIGHER °*CHILLS OR SWEATING °*NAUSEA AND VOMITING THAT IS NOT CONTROLLED WITH YOUR NAUSEA MEDICATION °*UNUSUAL SHORTNESS OF BREATH °*UNUSUAL BRUISING OR BLEEDING °*URINARY PROBLEMS (pain or burning when urinating, or frequent urination) °*BOWEL PROBLEMS (unusual diarrhea, constipation, pain near the anus) °TENDERNESS IN MOUTH AND THROAT WITH OR WITHOUT PRESENCE OF ULCERS (sore throat, sores in mouth, or a toothache) °UNUSUAL RASH, SWELLING OR PAIN  °UNUSUAL VAGINAL DISCHARGE OR ITCHING  ° °Items with * indicate a potential emergency and should be followed up as soon as possible or go to the Emergency Department if any problems should occur. ° °Please show the CHEMOTHERAPY ALERT CARD or IMMUNOTHERAPY ALERT CARD at check-in to the  Emergency Department and triage nurse. ° °Should you have questions after your visit or need to cancel or reschedule your appointment, please contact [facility]  Dept: [phone]  and follow the prompts.  Office hours are 8:00 a.m. to 4:30 p.m. Monday - Friday. Please note that voicemails left after 4:00 p.m. may not be returned until the following business day.  We are closed weekends and major holidays. You have access to a nurse at all times for urgent questions. Please call the main number to the clinic Dept: [phone] and follow the prompts. ° ° °For any non-urgent questions, you may also contact your provider using MyChart. We now offer e-Visits for anyone 18 and older to request care online for non-urgent symptoms. For details visit [email or web address]. °  °Also download the MyChart app! Go to the app store, search "MyChart", open the app, select , and log in with your MyChart username and password. ° °Due to Covid, a mask is required upon entering the hospital/clinic. If you do not have a mask, one will be given to you upon arrival. For doctor visits, patients may have 1 support person aged 18 or older with them. For treatment visits, patients cannot have anyone with them due to current Covid guidelines and our immunocompromised population.  ° °

## 2021-03-01 NOTE — Assessment & Plan Note (Signed)
06/21/2020:Patient palpated a right breast mass x 1 wk. Mammogram showed multiple confluent masses in the right breast at the 7 o'clock position measuring 2.3cm and at the 9 o'clock position measuring 2.2cm with one right axillary lymph node with cortical thickening. Biopsy showed invasive mammary carcinoma at both positions and in the axilla, grade 2-3, HER-2 equivocal by IHC (2+), positive by FISH (ratio 3.34), ER+ >95%, PR+ 25%, Ki67 60%.  Treatment plan: 1. Neoadjuvant chemotherapy with [facility] Perjeta 6 cyclescompleted 5/4/2022followed by HerceptinPerjeta versus Kadcylamaintenance for 1 year 2.Right mastectomy [name]): IDC, grade 3, spanning 4.0cm fibrotic area, clear margins, 1/4 right axillary lymph nodes positive for carcinoma, 0.5cm. Left lumpectomy: no evidence of malignancy 3. Followed by adjuvant radiation therapy completed 02/17/2021 4.Followed by adjuvant antiestrogen therapystarted 12/29/2020 5.Followed by neratinib ---------------------------------------------------------------------------------------------------------------------------------- Current Treatment: [name] Kadcyla toxicities: 1. Leukopenia: We will reduce the dosage of Kadcyla. 2. Mild thrombocytopenia 3. Elevation of AST and ALT: Will be monitored closely with reducing the dosage of Kadcyla.   Return to clinic every 3 weeks for Kadcyla

## 2021-03-03 ENCOUNTER — Telehealth: Payer: Self-pay | Admitting: Hematology and Oncology

## 2021-03-03 NOTE — Telephone Encounter (Signed)
Scheduled appt per 9/7 LOS - pt is aware of appts scheduled.

## 2021-03-06 ENCOUNTER — Other Ambulatory Visit: Payer: Self-pay | Admitting: *Deleted

## 2021-03-06 DIAGNOSIS — C50811 Malignant neoplasm of overlapping sites of right female breast: Secondary | ICD-10-CM

## 2021-03-06 DIAGNOSIS — Z170 Estrogen receptor positive status [ER+]: Secondary | ICD-10-CM

## 2021-03-06 DIAGNOSIS — N6310 Unspecified lump in the right breast, unspecified quadrant: Secondary | ICD-10-CM | POA: Diagnosis not present

## 2021-03-06 MED ORDER — LETROZOLE 2.5 MG PO TABS: 2.5000 mg | ORAL_TABLET | Freq: Every day | ORAL | 3 refills | Status: DC

## 2021-03-07 ENCOUNTER — Other Ambulatory Visit: Payer: Self-pay

## 2021-03-07 ENCOUNTER — Other Ambulatory Visit: Payer: Self-pay | Admitting: *Deleted

## 2021-03-07 ENCOUNTER — Inpatient Hospital Stay: Payer: BC Managed Care – PPO

## 2021-03-07 ENCOUNTER — Inpatient Hospital Stay: Payer: BC Managed Care – PPO | Admitting: Nutrition

## 2021-03-07 DIAGNOSIS — C50811 Malignant neoplasm of overlapping sites of right female breast: Secondary | ICD-10-CM | POA: Diagnosis not present

## 2021-03-07 DIAGNOSIS — Z5112 Encounter for antineoplastic immunotherapy: Secondary | ICD-10-CM | POA: Diagnosis not present

## 2021-03-07 DIAGNOSIS — Z170 Estrogen receptor positive status [ER+]: Secondary | ICD-10-CM

## 2021-03-07 DIAGNOSIS — Z0000 Encounter for general adult medical examination without abnormal findings: Secondary | ICD-10-CM

## 2021-03-07 DIAGNOSIS — D6959 Other secondary thrombocytopenia: Secondary | ICD-10-CM | POA: Diagnosis not present

## 2021-03-07 LAB — CMP (CANCER CENTER ONLY)
ALT: 44 U/L (ref 0–44)
AST: 64 U/L — ABNORMAL HIGH (ref 15–41)
Albumin: 3.9 g/dL (ref 3.5–5.0)
Alkaline Phosphatase: 142 U/L — ABNORMAL HIGH (ref 38–126)
Anion gap: 9 (ref 5–15)
BUN: 9 mg/dL (ref 6–20)
CO2: 28 mmol/L (ref 22–32)
Calcium: 10 mg/dL (ref 8.9–10.3)
Chloride: 104 mmol/L (ref 98–111)
Creatinine: 0.77 mg/dL (ref 0.44–1.00)
GFR, Estimated: >60 mL/min (ref >60)
Glucose, Bld: 81 mg/dL (ref 70–99)
Potassium: 3.7 mmol/L (ref 3.5–5.1)
Sodium: 141 mmol/L (ref 135–145)
Total Bilirubin: 0.5 mg/dL (ref 0.3–1.2)
Total Protein: 7.8 g/dL (ref 6.5–8.1)

## 2021-03-07 LAB — CBC WITH DIFFERENTIAL (CANCER CENTER ONLY)
Abs Immature Granulocytes: 0.01 10*3/uL (ref 0.00–0.07)
Basophils Absolute: 0.1 10*3/uL (ref 0.0–0.1)
Basophils Relative: 1 %
Eosinophils Absolute: 0.2 10*3/uL (ref 0.0–0.5)
Eosinophils Relative: 6 %
HCT: 36.8 % (ref 36.0–46.0)
Hemoglobin: 12.6 g/dL (ref 12.0–15.0)
Immature Granulocytes: 0 %
Lymphocytes Relative: 17 %
Lymphs Abs: 0.6 10*3/uL — ABNORMAL LOW (ref 0.7–4.0)
MCH: 31.9 pg (ref 26.0–34.0)
MCHC: 34.2 g/dL (ref 30.0–36.0)
MCV: 93.2 fL (ref 80.0–100.0)
Monocytes Absolute: 0.7 10*3/uL (ref 0.1–1.0)
Monocytes Relative: 18 %
Neutro Abs: 2 10*3/uL (ref 1.7–7.7)
Neutrophils Relative %: 58 %
Platelet Count: 88 10*3/uL — ABNORMAL LOW (ref 150–400)
RBC: 3.95 MIL/uL (ref 3.87–5.11)
RDW: 12.3 % (ref 11.5–15.5)
WBC Count: 3.6 10*3/uL — ABNORMAL LOW (ref 4.0–10.5)
nRBC: 0 % (ref 0.0–0.2)

## 2021-03-07 LAB — URINALYSIS, COMPLETE (UACMP) WITH MICROSCOPIC
Bilirubin Urine: NEGATIVE
Glucose, UA: NEGATIVE mg/dL
Hgb urine dipstick: NEGATIVE
Ketones, ur: NEGATIVE mg/dL
Nitrite: NEGATIVE
Protein, ur: NEGATIVE mg/dL
Specific Gravity, Urine: 1.005 (ref 1.005–1.030)
pH: 7 (ref 5.0–8.0)

## 2021-03-07 LAB — VITAMIN D 25 HYDROXY (VIT D DEFICIENCY, FRACTURES): Vit D, 25-Hydroxy: 30.71 ng/mL (ref 30–100)

## 2021-03-07 LAB — MAGNESIUM: Magnesium: 2.1 mg/dL (ref 1.7–2.4)

## 2021-03-07 NOTE — Progress Notes (Signed)
Nutrition follow-up completed with patient.  Patient diagnosed with breast cancer receiving Kadcyla and followed by [name].  She has completed radiation therapy. Weight is stable and documented as 143.9 pounds September 7. Labs were reviewed.  Estimated energy needs: 1625-1950 cal, 78 to 98 g protein, 1.6 L fluid.  Nutrition diagnosis: Food and nutrition related knowledge deficit improved.  I met with patient today.  She has been following intermittent fasting for 13 hours daily "most days."  States she has missed a few.  She has also been following a plant-based diet with less added sugar.  She will occasionally eat chicken and fish.  She has follow-up questions.  Intervention: Multiple questions asked based on intermittent fasting and healthy diet and answers provided Recommended patient use the app myfitnesspal if she desired to monitor calories and protein to ensure adequate intake.  Stressed importance of measuring/weighing of foods and documenting all intake for accuracy.  Monitoring, evaluation, goals: Patient will tolerate healthy plant-based diet for weight maintenance throughout treatment.  Nutrition diagnosis resolved.  No follow-up scheduled.  **Disclaimer: This note was dictated with voice recognition software. Similar sounding words can inadvertently be transcribed and this note may contain transcription errors which may not have been corrected upon publication of note.**

## 2021-03-08 ENCOUNTER — Encounter: Payer: Self-pay | Admitting: Hematology and Oncology

## 2021-03-09 ENCOUNTER — Encounter: Payer: Self-pay | Admitting: *Deleted

## 2021-03-09 LAB — URINE CULTURE: Culture: 20000 — AB

## 2021-03-13 DIAGNOSIS — C50911 Malignant neoplasm of unspecified site of right female breast: Secondary | ICD-10-CM | POA: Diagnosis not present

## 2021-03-17 ENCOUNTER — Other Ambulatory Visit: Payer: Self-pay

## 2021-03-17 DIAGNOSIS — N6313 Unspecified lump in the right breast, lower outer quadrant: Secondary | ICD-10-CM | POA: Diagnosis not present

## 2021-03-17 DIAGNOSIS — N641 Fat necrosis of breast: Secondary | ICD-10-CM | POA: Diagnosis not present

## 2021-03-17 DIAGNOSIS — R2231 Localized swelling, mass and lump, right upper limb: Secondary | ICD-10-CM | POA: Diagnosis not present

## 2021-03-17 DIAGNOSIS — I898 Other specified noninfective disorders of lymphatic vessels and lymph nodes: Secondary | ICD-10-CM | POA: Diagnosis not present

## 2021-03-20 ENCOUNTER — Ambulatory Visit: Payer: BC Managed Care – PPO | Attending: General Surgery

## 2021-03-20 ENCOUNTER — Other Ambulatory Visit: Payer: Self-pay

## 2021-03-20 VITALS — Wt 142.0 lb

## 2021-03-20 DIAGNOSIS — C50911 Malignant neoplasm of unspecified site of right female breast: Secondary | ICD-10-CM | POA: Diagnosis not present

## 2021-03-20 DIAGNOSIS — Z483 Aftercare following surgery for neoplasm: Secondary | ICD-10-CM | POA: Insufficient documentation

## 2021-03-20 NOTE — Therapy (Signed)
[place], [place], [address] Phone: [phone]   Fax:  [phone]  Physical Therapy Treatment  Patient Details  Name: Teresa Sheppard MRN: [id number] Date of Birth: [birth year] Referring Provider (PT): [name]   Encounter Date: 03/20/2021   PT End of Session - 03/20/21 1515     Visit Number 2   # unchanged due to screen only   PT Start Time 1502    PT Stop Time 3790    PT Time Calculation (min) 14 min    Activity Tolerance Patient tolerated treatment well    Behavior During Therapy [facility] for tasks assessed/performed             Past Medical History:  Diagnosis Date   Anxiety    Breast cancer in female [facility])    Right   Family history of breast cancer 11/07/2020   Family history of pancreatic cancer 11/07/2020   History of kidney stones 2014-2015   Hyperthyroidism    No longer an issue   PONV (postoperative nausea and vomiting)    Thyromegaly     Past Surgical History:  Procedure Laterality Date   ABDOMINAL WALL DEFECT REPAIR     following hernia repair in 2010   [place]     2008  while pregnant with last child. Scar tissue from appendectomy wrapped around bowel and was snipped to free bowel.    CESAREAN SECTION     DILATION AND CURETTAGE OF UTERUS     2000 and 2014   HERNIA REPAIR     MASTECTOMY W/ SENTINEL NODE BIOPSY Right 11/28/2020   Procedure: RIGHT MASTECTOMY WITH RIGHT AXILLARY SENTINEL LYMPH NODE BIOPSY;  Surgeon: [name], MD;  Location: [place];  Service: General;  Laterality: Right;   PORT-A-CATH REMOVAL Left 01/30/2021   Procedure: PORT REMOVAL;  Surgeon: [name], MD;  Location: [place];  Service: General;  Laterality: Left;  LOCAL   PORTACATH PLACEMENT Left 07/12/2020   Procedure: INSERTION PORT-A-CATH WITH ULTRASOUND GUIDANCE;  Surgeon: [name], MD;  Location: [place];  Service: General;  Laterality: Left;   RADIOACTIVE  SEED GUIDED AXILLARY SENTINEL LYMPH NODE Right 11/28/2020   Procedure: RADIOACTIVE SEED GUIDED RIGHT AXILLARY AXILLARY SENTINEL LYMPH NODE EXCISION;  Surgeon: [name], MD;  Location: [place];  Service: General;  Laterality: Right;   RADIOACTIVE SEED GUIDED EXCISIONAL BREAST BIOPSY Left 11/28/2020   Procedure: RADIOACTIVE SEED GUIDED LEFT EXCISIONAL BREAST BIOPSY;  Surgeon: [name], MD;  Location: [place];  Service: General;  Laterality: Left;   ROOT CANAL     2019   TONSILLECTOMY  1969   URETERAL EXPLORATION     dilataion for congenitally small ureter    Vitals:   03/20/21 1502  Weight: 142 lb (64.4 kg)     Subjective Assessment - 03/20/21 1503     Subjective Pt returns for her 3 month L-Dex screen.    Pertinent History R breast cancer, left outer quadrant, triple positive, Ki 67 - 60%, grade 2 -3 IDC, neoadjuvant chemo and still is getting immuno therapy,  right mastectomy on 11/28/2020 with 4 nodes removed on the right and she had an excision of breast tissue on the left that is benign  and just started radiation                    L-DEX FLOWSHEETS - 03/20/21 1500       L-DEX LYMPHEDEMA SCREENING  Measurement Type Unilateral    L-DEX MEASUREMENT EXTREMITY Upper Extremity    POSITION  Standing    DOMINANT SIDE Right    At Risk Side Right    BASELINE SCORE (UNILATERAL) -3.9    L-DEX SCORE (UNILATERAL) -6.5    VALUE CHANGE (UNILAT) -2.6                                     PT Long Term Goals - 01/04/21 1608       PT LONG TERM GOAL #1   Title Pt will return to baseline ROM measurements and will not demonstrate any signs of lymphedema.    Time 6    Period Months    Status Achieved      PT LONG TERM GOAL #2   Title Pt will be independent in a strengthening home exercise program to return to desired activities and decrease risk of lymphedema    Time 12    Period Weeks    Status New                   Plan -  03/20/21 1518     Clinical Impression Statement Pt returns for her 3 month L-Dex screen. Her change from baseline of -2.6 is WNLs so no further treatment is required at this time except to cont every 3 month L-Dex screens which pt is agreeable to. Pt had multiple questions about how to progress with exercise and weight lifting so briefly educated her about the "low and slow" progression to keep risk of lymphedema low but encouraged her to attend the ABC class to have more of her questions answered and she signed up for this before leaving today.    PT Next Visit Plan Cont every 3 month L-Dex screens for up to 2 years from her SLNB (~11/29/2022)    Consulted and Agree with Plan of Care Patient             Patient will benefit from skilled therapeutic intervention in order to improve the following deficits and impairments:     Visit Diagnosis: Aftercare following surgery for neoplasm     Problem List Patient Active Problem List   Diagnosis Date Noted   S/P mastectomy, left 11/28/2020   Genetic testing 11/15/2020   Family history of breast cancer 11/07/2020   Family history of pancreatic cancer 11/07/2020   Port-A-Cath in place 10/26/2020   Dysphagia 10/26/2020   Acute urinary tract infection 10/14/2020   Malignant tumor of breast ([place]) 06/22/2020   Malignant neoplasm of overlapping sites of right breast in female, estrogen receptor positive ([place]) 06/21/2020   Hyperthyroidism 03/28/2016    Otelia Limes, PTA 03/20/2021, 3:21 PM  [place], [place], [address] Phone: [phone]   Fax:  [phone]  Name: DORETHA GODING MRN: [id number] Date of Birth: 1960/12/24

## 2021-03-21 NOTE — Progress Notes (Signed)
Patient Care Team: Wenda Low, MD as PCP - General (Internal Medicine) Rockwell Germany, RN as Oncology Nurse Navigator Mauro Kaufmann, RN as Oncology Nurse Navigator [name], MD as Consulting Physician (General Surgery) Nicholas Lose, MD as Consulting Physician (Hematology and Oncology) Kyung Rudd, MD as Consulting Physician (Radiation Oncology)  DIAGNOSIS:    ICD-10-CM   1. Malignant neoplasm of overlapping sites of right breast in female, estrogen receptor positive ([place])  C50.811 MM DIAG BREAST TOMO UNI LEFT   Z17.0 MR BREAST BILATERAL W WO CONTRAST INC CAD      SUMMARY OF ONCOLOGIC HISTORY: Oncology History  Malignant neoplasm of overlapping sites of right breast in female, estrogen receptor positive ([place])  06/21/2020 Initial Diagnosis   Patient palpated a right breast mass x 1 wk. Mammogram showed multiple confluent masses in the right breast at the 7 o'clock position measuring 2.3cm and at the 9 o'clock position measuring 2.2cm with one right axillary lymph node with cortical thickening. Biopsy showed invasive mammary carcinoma at both positions and in the axilla, grade 2-3, HER-2 equivocal by IHC (2+), positive by FISH (ratio 3.34), ER+ >95%, PR+ 25%, Ki67 60%.   06/29/2020 Cancer Staging   Staging form: Breast, AJCC 8th Edition - Clinical stage from 06/29/2020: Stage IB (cT2, cN1(f), cM0, G3, ER+, PR+, HER2+) - Signed by Nicholas Lose, MD on 06/29/2020   07/13/2020 - 10/26/2020 Chemotherapy      Patient is on Antibody Plan: BREAST TRASTUZUMAB + PERTUZUMAB Q21D     10/28/2020 Breast MRI   Complete imaging response   11/14/2020 Genetic Testing   Negative hereditary cancer genetic testing: no pathogenic variants detected in [facility] BRCAPlus Panel and [facility] CancerNext-Expanded +RNAinsight Panel.  The report dates are Nov 14, 2020 and December 05, 2020, respectively.    The BRCAplus panel offered by [employer] and includes sequencing and deletion/duplication analysis  for the following 8 genes: ATM, BRCA1, BRCA2, CDH1, CHEK2, PALB2, PTEN, and TP53.  The CancerNext-Expanded gene panel offered by [facility] and includes sequencing, rearrangement, and RNA analysis for the following 77 genes: AIP, ALK, APC, ATM, AXIN2, BAP1, BARD1, BLM, BMPR1A, BRCA1, BRCA2, BRIP1, CDC73, CDH1, CDK4, CDKN1B, CDKN2A, CHEK2, CTNNA1, DICER1, FANCC, FH, FLCN, GALNT12, KIF1B, LZTR1, MAX, MEN1, MET, MLH1, MSH2, MSH3, MSH6, MUTYH, NBN, NF1, NF2, NTHL1, PALB2, PHOX2B, PMS2, POT1, PRKAR1A, PTCH1, PTEN, RAD51C, RAD51D, RB1, RECQL, RET, SDHA, SDHAF2, SDHB, SDHC, SDHD, SMAD4, SMARCA4, SMARCB1, SMARCE1, STK11, SUFU, TMEM127, TP53, TSC1, TSC2, VHL and XRCC2 (sequencing and deletion/duplication); EGFR, EGLN1, HOXB13, KIT, MITF, PDGFRA, POLD1, and POLE (sequencing only); EPCAM and GREM1 (deletion/duplication only).    11/28/2020 Surgery   Right mastectomy [name]): IDC, grade 3, spanning 4.0cm fibrotic area, clear margins, 1/4 right axillary lymph nodes positive for carcinoma, 0.5cm.   Left lumpectomy: no evidence of malignancy   12/07/2020 -  Chemotherapy    Patient is on Treatment Plan: BREAST ADO-TRASTUZUMAB EMTANSINE [facility]) Q21D       01/04/2021 - 02/17/2021 Radiation Therapy   Adjuvant radiation     CHIEF COMPLIANT: [name]  INTERVAL HISTORY: Teresa Sheppard is a 60 y.o. with above-mentioned history of right breast cancer who had a right mastectomy and completed neoadjuvant chemotherapy and with [place], and is currently on [name]. She reports to the clinic today for treatment.  She is tolerating Kadcyla fairly well.  She is contemplating on getting a contralateral mastectomy in April next year.  She is going to talk with Dr. [name] about that.  ALLERGIES:  is allergic to shellfish-derived products and penicillins.  MEDICATIONS:  Current Outpatient Medications  Medication Sig Dispense Refill   acetaminophen (TYLENOL) 325 MG tablet Take 325 mg by  mouth every 6 (six) hours as needed (headaches / pain).     bimatoprost (LATISSE) 0.03 % ophthalmic solution Apply 1 drop to eye at bedtime. Place one drop on applicator and apply evenly along the skin of the upper eyelid at base of eyelashes once daily at bedtime; repeat procedure for second eye (use a clean applicator). 3 mL 12   Calcium-Vitamin D-Vitamin K ([employer] CALCIUM SOFT CHEWS) 650-12.5-40 MG-MCG-MCG CHEW Chew 1 tablet by mouth 2 (two) times daily.     cholecalciferol (VITAMIN D3) 25 MCG (1000 UNIT) tablet Take 1,000 Units by mouth daily.     Cranberry-Vitamin C 250-60 MG CAPS Take 1 tablet by mouth in the morning and at bedtime.     hydrocortisone cream 1 % Apply 1 application topically 4 (four) times daily as needed for itching.     letrozole (FEMARA) 2.5 MG tablet Take 1 tablet (2.5 mg total) by mouth daily. 90 tablet 3   lidocaine-prilocaine (EMLA) cream Apply to affected area once (Patient taking differently: Apply 1 application topically as needed (port access).) 30 g 3   loratadine (CLARITIN) 10 MG tablet Take 10 mg by mouth daily.     Multiple Vitamins-Minerals (HAIR/SKIN/NAILS/BIOTIN PO) Take 1 capsule by mouth daily.     Polyethyl Glycol-Propyl Glycol (SYSTANE OP) Place 1 drop into both eyes daily as needed (dry eyes).     Probiotic Product (ALIGN) 4 MG CAPS Take 4 mg by mouth daily.     Wound Cleansers (RADIAPLEX EX) Apply 1 application topically daily as needed (radiation).     No current facility-administered medications for this visit.   Facility-Administered Medications Ordered in Other Visits  Medication Dose Route Frequency Provider Last Rate Last Admin   sodium chloride flush (NS) 0.9 % injection 10 mL  10 mL Intracatheter Once Nicholas Lose, MD        PHYSICAL EXAMINATION: ECOG PERFORMANCE STATUS: 1 - Symptomatic but completely ambulatory  Vitals:   03/22/21 0832  BP: 118/70  Pulse: 63  Resp: 16  Temp: 97.9 F (36.6 C)  SpO2: 100%   Filed Weights   03/22/21  0832  Weight: 142 lb 9.6 oz (64.7 kg)     LABORATORY DATA:  I have reviewed the data as listed CMP Latest Ref Rng & Units 03/22/2021 03/07/2021 03/01/2021  Glucose 70 - 99 mg/dL 87 81 78  BUN 6 - 20 mg/dL _0 Creatinine 0.44 - 1.00 mg/dL 0.76 0.77 0.81  Sodium 135 - 145 mmol/L 141 141 141  Potassium 3.5 - 5.1 mmol/L 3.8 3.7 3.9  Chloride 98 - 111 mmol/L 106 104 107  CO2 22 - 32 mmol/L _1 Calcium 8.9 - 10.3 mg/dL 9.9 10.0 10.0  Total Protein 6.5 - 8.1 g/dL 7.6 7.8 7.9  Total Bilirubin 0.3 - 1.2 mg/dL 0.6 0.5 0.4  Alkaline Phos 38 - 126 U/L 146(H) 142(H) 131(H)  AST 15 - 41 U/L 55(H) 64(H) 66(H)  ALT 0 - 44 U/L 42 44 58(H)    Lab Results  Component Value Date   WBC 3.3 (L) 03/22/2021   HGB 13.0 03/22/2021   HCT 38.8 03/22/2021   MCV 93.9 03/22/2021   PLT 127 (L) 03/22/2021   NEUTROABS 1.8 03/22/2021    ASSESSMENT & PLAN:  Malignant neoplasm of overlapping sites of right  breast in female, estrogen receptor positive ([place]) 06/21/2020:Patient palpated a right breast mass x 1 wk. Mammogram showed multiple confluent masses in the right breast at the 7 o'clock position measuring 2.3cm and at the 9 o'clock position measuring 2.2cm with one right axillary lymph node with cortical thickening. Biopsy showed invasive mammary carcinoma at both positions and in the axilla, grade 2-3, HER-2 equivocal by IHC (2+), positive by FISH (ratio 3.34), ER+ >95%, PR+ 25%, Ki67 60%.   Treatment plan: 1. Neoadjuvant chemotherapy with TCH Perjeta 6 cycles completed 10/26/2020 followed by Herceptin Perjeta versus [name] for 1 year 2.  Right mastectomy [name]): IDC, grade 3, spanning 4.0cm fibrotic area, clear margins, 1/4 right axillary lymph nodes positive for carcinoma, 0.5cm. Left lumpectomy: no evidence of malignancy 3. Followed by adjuvant radiation therapy completed 02/17/2021 4.  Followed by adjuvant antiestrogen therapy started 12/29/2020 5.  Followed by  neratinib ---------------------------------------------------------------------------------------------------------------------------------- Current Treatment: [name] Kadcyla toxicities: Leukopenia: We reduced the dosage of Kadcyla. Mild thrombocytopenia: Stable Elevation of AST and ALT: Will be monitored closely with reducing the dosage of Kadcyla.  These numbers are stable   Letrozole toxicities: Tolerating it extremely well. Return to clinic every 3 weeks for Kadcyla    Orders Placed This Encounter  Procedures   MM DIAG BREAST TOMO UNI LEFT    Standing Status:   Future    Standing Expiration Date:   03/22/2022    Order Specific Question:   Reason for Exam (SYMPTOM  OR DIAGNOSIS REQUIRED)    Answer:   Annual mammogram left breast with H/O right mastectomy    Order Specific Question:   Is the patient pregnant?    Answer:   No    Order Specific Question:   Preferred imaging location?    [facility]   MR BREAST BILATERAL [address] [place] CAD    Standing Status:   Future    Standing Expiration Date:   03/22/2022    Order Specific Question:   If indicated for the ordered procedure, I authorize the administration of contrast media per Radiology protocol    Answer:   Yes    Order Specific Question:   What is the patient's sedation requirement?    Answer:   No Sedation    Order Specific Question:   Does the patient have a pacemaker or implanted devices?    Answer:   No    Order Specific Question:   Preferred imaging location?    Answer:   [facility] (table limit-550lbs)   The patient has a good understanding of the overall plan. she agrees with it. she will call with any problems that may develop before the next visit here.  Total time spent: 30 mins including face to face time and time spent for planning, charting and coordination of care  Rulon Eisenmenger, MD, MPH 03/22/2021  I, Thana Ates, am acting as scribe for Dr. Nicholas Lose.  I have  reviewed the above documentation for accuracy and completeness, and I agree with the above.

## 2021-03-22 ENCOUNTER — Other Ambulatory Visit: Payer: Self-pay

## 2021-03-22 ENCOUNTER — Other Ambulatory Visit: Payer: BC Managed Care – PPO

## 2021-03-22 ENCOUNTER — Inpatient Hospital Stay: Payer: BC Managed Care – PPO

## 2021-03-22 ENCOUNTER — Ambulatory Visit: Payer: BC Managed Care – PPO

## 2021-03-22 ENCOUNTER — Inpatient Hospital Stay (HOSPITAL_BASED_OUTPATIENT_CLINIC_OR_DEPARTMENT_OTHER): Payer: BC Managed Care – PPO | Admitting: Hematology and Oncology

## 2021-03-22 DIAGNOSIS — Z5112 Encounter for antineoplastic immunotherapy: Secondary | ICD-10-CM | POA: Diagnosis not present

## 2021-03-22 DIAGNOSIS — C50811 Malignant neoplasm of overlapping sites of right female breast: Secondary | ICD-10-CM | POA: Diagnosis not present

## 2021-03-22 DIAGNOSIS — Z170 Estrogen receptor positive status [ER+]: Secondary | ICD-10-CM

## 2021-03-22 DIAGNOSIS — D6959 Other secondary thrombocytopenia: Secondary | ICD-10-CM | POA: Diagnosis not present

## 2021-03-22 LAB — CMP (CANCER CENTER ONLY)
ALT: 42 U/L (ref 0–44)
AST: 55 U/L — ABNORMAL HIGH (ref 15–41)
Albumin: 3.7 g/dL (ref 3.5–5.0)
Alkaline Phosphatase: 146 U/L — ABNORMAL HIGH (ref 38–126)
Anion gap: 11 (ref 5–15)
BUN: 11 mg/dL (ref 6–20)
CO2: 24 mmol/L (ref 22–32)
Calcium: 9.9 mg/dL (ref 8.9–10.3)
Chloride: 106 mmol/L (ref 98–111)
Creatinine: 0.76 mg/dL (ref 0.44–1.00)
GFR, Estimated: >60 mL/min (ref >60)
Glucose, Bld: 87 mg/dL (ref 70–99)
Potassium: 3.8 mmol/L (ref 3.5–5.1)
Sodium: 141 mmol/L (ref 135–145)
Total Bilirubin: 0.6 mg/dL (ref 0.3–1.2)
Total Protein: 7.6 g/dL (ref 6.5–8.1)

## 2021-03-22 LAB — CBC WITH DIFFERENTIAL/PLATELET
Abs Immature Granulocytes: 0.02 10*3/uL (ref 0.00–0.07)
Basophils Absolute: 0.1 10*3/uL (ref 0.0–0.1)
Basophils Relative: 2 %
Eosinophils Absolute: 0.4 10*3/uL (ref 0.0–0.5)
Eosinophils Relative: 11 %
HCT: 38.8 % (ref 36.0–46.0)
Hemoglobin: 13 g/dL (ref 12.0–15.0)
Immature Granulocytes: 1 %
Lymphocytes Relative: 21 %
Lymphs Abs: 0.7 10*3/uL (ref 0.7–4.0)
MCH: 31.5 pg (ref 26.0–34.0)
MCHC: 33.5 g/dL (ref 30.0–36.0)
MCV: 93.9 fL (ref 80.0–100.0)
Monocytes Absolute: 0.4 10*3/uL (ref 0.1–1.0)
Monocytes Relative: 12 %
Neutro Abs: 1.8 10*3/uL (ref 1.7–7.7)
Neutrophils Relative %: 53 %
Platelets: 127 10*3/uL — ABNORMAL LOW (ref 150–400)
RBC: 4.13 MIL/uL (ref 3.87–5.11)
RDW: 12.4 % (ref 11.5–15.5)
WBC: 3.3 10*3/uL — ABNORMAL LOW (ref 4.0–10.5)
nRBC: 0 % (ref 0.0–0.2)

## 2021-03-22 MED ORDER — ACETAMINOPHEN 325 MG PO TABS
650.0000 mg | ORAL_TABLET | Freq: Once | ORAL | Status: AC
  Administered 2021-03-22: 650 mg via ORAL
  Filled 2021-03-22: qty 2

## 2021-03-22 MED ORDER — HEPARIN SOD (PORK) LOCK FLUSH 100 UNIT/ML IV SOLN: 500.0000 [IU] | Freq: Once | INTRAVENOUS | Status: DC | PRN

## 2021-03-22 MED ORDER — SODIUM CHLORIDE 0.9 % IV SOLN: Freq: Once | INTRAVENOUS | Status: AC

## 2021-03-22 MED ORDER — SODIUM CHLORIDE 0.9% FLUSH: 10.0000 mL | Freq: Once | INTRAVENOUS | Status: DC

## 2021-03-22 MED ORDER — DIPHENHYDRAMINE HCL 25 MG PO CAPS
50.0000 mg | ORAL_CAPSULE | Freq: Once | ORAL | Status: AC
  Administered 2021-03-22: 50 mg via ORAL
  Filled 2021-03-22: qty 2

## 2021-03-22 MED ORDER — SODIUM CHLORIDE 0.9 % IV SOLN
3.0000 mg/kg | Freq: Once | INTRAVENOUS | Status: AC
  Administered 2021-03-22: 200 mg via INTRAVENOUS
  Filled 2021-03-22: qty 10

## 2021-03-22 MED ORDER — SODIUM CHLORIDE 0.9% FLUSH: 10.0000 mL | INTRAVENOUS | Status: DC | PRN

## 2021-03-22 NOTE — Assessment & Plan Note (Signed)
06/21/2020:Patient palpated a right breast mass x 1 wk. Mammogram showed multiple confluent masses in the right breast at the 7 o'clock position measuring 2.3cm and at the 9 o'clock position measuring 2.2cm with one right axillary lymph node with cortical thickening. Biopsy showed invasive mammary carcinoma at both positions and in the axilla, grade 2-3, HER-2 equivocal by IHC (2+), positive by FISH (ratio 3.34), ER+ >95%, PR+ 25%, Ki67 60%.  Treatment plan: 1. Neoadjuvant chemotherapy with TCH Perjeta 6 cyclescompleted 5/4/2022followed by HerceptinPerjeta versus Kadcylamaintenance for 1 year 2.Right mastectomy ([place]): IDC, grade 3, spanning 4.0cm fibrotic area, clear margins, 1/4 right axillary lymph nodes positive for carcinoma, 0.5cm. Left lumpectomy: no evidence of malignancy 3. Followed by adjuvant radiation therapycompleted 02/17/2021 4.Followed by adjuvant antiestrogen therapystarted 12/29/2020 5.Followed by neratinib ---------------------------------------------------------------------------------------------------------------------------------- Current Treatment: [name] Kadcyla toxicities: 1. Leukopenia: We reducedthe dosage of Kadcyla. 2. Mild thrombocytopenia: Stable 3. Elevation of AST and ALT: Will be monitored closely with reducing the dosage of Kadcyla.These numbers are stable  Letrozole toxicities: Tolerating it extremely well. Return to clinic every 3 weeks for Kadcyla 

## 2021-03-22 NOTE — Patient Instructions (Signed)
[facility] MEDICAL ONCOLOGY  Discharge Instructions: °Thank you for choosing [facility] to provide your oncology and hematology care.  ° °If you have a lab appointment with the Cancer Center, please go directly to the Cancer Center and check in at the registration area. °  °Wear comfortable clothing and clothing appropriate for easy access to any Portacath or PICC line.  ° °We strive to give you quality time with your provider. You may need to reschedule your appointment if you arrive late (15 or more minutes).  Arriving late affects you and other patients whose appointments are after yours.  Also, if you miss three or more appointments without notifying the office, you may be dismissed from the clinic at the provider’s discretion.    °  °For prescription refill requests, have your pharmacy contact our office and allow 72 hours for refills to be completed.   ° °Today you received the following chemotherapy and/or immunotherapy agents kadcyla    °  °To help prevent nausea and vomiting after your treatment, we encourage you to take your nausea medication as directed. ° °BELOW ARE SYMPTOMS THAT SHOULD BE REPORTED IMMEDIATELY: °*FEVER GREATER THAN 100.4 F (38 °C) OR HIGHER °*CHILLS OR SWEATING °*NAUSEA AND VOMITING THAT IS NOT CONTROLLED WITH YOUR NAUSEA MEDICATION °*UNUSUAL SHORTNESS OF BREATH °*UNUSUAL BRUISING OR BLEEDING °*URINARY PROBLEMS (pain or burning when urinating, or frequent urination) °*BOWEL PROBLEMS (unusual diarrhea, constipation, pain near the anus) °TENDERNESS IN MOUTH AND THROAT WITH OR WITHOUT PRESENCE OF ULCERS (sore throat, sores in mouth, or a toothache) °UNUSUAL RASH, SWELLING OR PAIN  °UNUSUAL VAGINAL DISCHARGE OR ITCHING  ° °Items with * indicate a potential emergency and should be followed up as soon as possible or go to the Emergency Department if any problems should occur. ° °Please show the CHEMOTHERAPY ALERT CARD or IMMUNOTHERAPY ALERT CARD at check-in to the  Emergency Department and triage nurse. ° °Should you have questions after your visit or need to cancel or reschedule your appointment, please contact [facility]  Dept: [phone]  and follow the prompts.  Office hours are 8:00 a.m. to 4:30 p.m. Monday - Friday. Please note that voicemails left after 4:00 p.m. may not be returned until the following business day.  We are closed weekends and major holidays. You have access to a nurse at all times for urgent questions. Please call the main number to the clinic Dept: [phone] and follow the prompts. ° ° °For any non-urgent questions, you may also contact your provider using MyChart. We now offer e-Visits for anyone 18 and older to request care online for non-urgent symptoms. For details visit [email or web address]. °  °Also download the MyChart app! Go to the app store, search "MyChart", open the app, select [facility], and log in with your MyChart username and password. ° °Due to Covid, a mask is required upon entering the hospital/clinic. If you do not have a mask, one will be given to you upon arrival. For doctor visits, patients may have 1 support person aged 18 or older with them. For treatment visits, patients cannot have anyone with them due to current Covid guidelines and our immunocompromised population.  ° °

## 2021-03-26 NOTE — Progress Notes (Signed)
  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 During treatment she did very well with radiotherapy and did not have significant desquamation. She reports she is doing pretty well. She does have some fatigue that has waxed and waned a bit and could be also complicated by recent covid booster and ongoing systemic therapy.  Impression/Plan: 1. 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 The patient has been doing well since completion of radiotherapy. We discussed that we would be happy to continue to follow her as needed, but she will also continue to follow up with [name] in medical oncology. She was counseled on skin care as well as measures to avoid sun exposure to this area.        Carola Rhine, PAC

## 2021-03-27 ENCOUNTER — Ambulatory Visit
Admission: RE | Admit: 2021-03-27 | Discharge: 2021-03-27 | Disposition: A | Discharge status: Home / Self Care | Payer: BC Managed Care – PPO | Source: Ambulatory Visit | Attending: Radiation Oncology | Admitting: Radiation Oncology

## 2021-03-27 DIAGNOSIS — Z170 Estrogen receptor positive status [ER+]: Secondary | ICD-10-CM | POA: Insufficient documentation

## 2021-03-27 DIAGNOSIS — C50811 Malignant neoplasm of overlapping sites of right female breast: Secondary | ICD-10-CM | POA: Insufficient documentation

## 2021-03-29 ENCOUNTER — Other Ambulatory Visit: Payer: Self-pay | Admitting: *Deleted

## 2021-03-29 DIAGNOSIS — C50811 Malignant neoplasm of overlapping sites of right female breast: Secondary | ICD-10-CM

## 2021-03-29 NOTE — Progress Notes (Signed)
Per Pt request RN successfully faxed Mammogram and Breast MRI orders to [facility].

## 2021-04-05 ENCOUNTER — Ambulatory Visit
Admission: RE | Admit: 2021-04-05 | Discharge: 2021-04-05 | Disposition: A | Discharge status: Home / Self Care | Payer: BC Managed Care – PPO | Source: Ambulatory Visit | Attending: Surgery | Admitting: Surgery

## 2021-04-05 DIAGNOSIS — E042 Nontoxic multinodular goiter: Secondary | ICD-10-CM | POA: Diagnosis not present

## 2021-04-08 NOTE — Progress Notes (Signed)
Patient Care Team: Wenda Low, MD as PCP - General (Internal Medicine) Rockwell Germany, RN as Oncology Nurse Navigator Mauro Kaufmann, RN as Oncology Nurse Navigator [name], MD as Consulting Physician (General Surgery) Nicholas Lose, MD as Consulting Physician (Hematology and Oncology) Kyung Rudd, MD as Consulting Physician (Radiation Oncology)  DIAGNOSIS:    ICD-10-CM   1. Malignant neoplasm of overlapping sites of right breast in female, estrogen receptor positive ([place])  C50.811    Z17.0       SUMMARY OF ONCOLOGIC HISTORY: Oncology History  Malignant neoplasm of overlapping sites of right breast in female, estrogen receptor positive ([place])  06/21/2020 Initial Diagnosis   Patient palpated a right breast mass x 1 wk. Mammogram showed multiple confluent masses in the right breast at the 7 o'clock position measuring 2.3cm and at the 9 o'clock position measuring 2.2cm with one right axillary lymph node with cortical thickening. Biopsy showed invasive mammary carcinoma at both positions and in the axilla, grade 2-3, HER-2 equivocal by IHC (2+), positive by FISH (ratio 3.34), ER+ >95%, PR+ 25%, Ki67 60%.   06/29/2020 Cancer Staging   Staging form: Breast, AJCC 8th Edition - Clinical stage from 06/29/2020: Stage IB (cT2, cN1(f), cM0, G3, ER+, PR+, HER2+) - Signed by Nicholas Lose, MD on 06/29/2020   07/13/2020 - 10/26/2020 Chemotherapy      Patient is on Antibody Plan: BREAST TRASTUZUMAB + PERTUZUMAB Q21D     10/28/2020 Breast MRI   Complete imaging response   11/14/2020 Genetic Testing   Negative hereditary cancer genetic testing: no pathogenic variants detected in [facility] BRCAPlus Panel and [facility] CancerNext-Expanded +RNAinsight Panel.  The report dates are Nov 14, 2020 and December 05, 2020, respectively.    The BRCAplus panel offered by [employer] and includes sequencing and deletion/duplication analysis for the following 8 genes: ATM, BRCA1, BRCA2, CDH1, CHEK2, PALB2, PTEN,  and TP53.  The CancerNext-Expanded gene panel offered by [facility] and includes sequencing, rearrangement, and RNA analysis for the following 77 genes: AIP, ALK, APC, ATM, AXIN2, BAP1, BARD1, BLM, BMPR1A, BRCA1, BRCA2, BRIP1, CDC73, CDH1, CDK4, CDKN1B, CDKN2A, CHEK2, CTNNA1, DICER1, FANCC, FH, FLCN, GALNT12, KIF1B, LZTR1, MAX, MEN1, MET, MLH1, MSH2, MSH3, MSH6, MUTYH, NBN, NF1, NF2, NTHL1, PALB2, PHOX2B, PMS2, POT1, PRKAR1A, PTCH1, PTEN, RAD51C, RAD51D, RB1, RECQL, RET, SDHA, SDHAF2, SDHB, SDHC, SDHD, SMAD4, SMARCA4, SMARCB1, SMARCE1, STK11, SUFU, TMEM127, TP53, TSC1, TSC2, VHL and XRCC2 (sequencing and deletion/duplication); EGFR, EGLN1, HOXB13, KIT, MITF, PDGFRA, POLD1, and POLE (sequencing only); EPCAM and GREM1 (deletion/duplication only).    11/28/2020 Surgery   Right mastectomy [name]): IDC, grade 3, spanning 4.0cm fibrotic area, clear margins, 1/4 right axillary lymph nodes positive for carcinoma, 0.5cm.   Left lumpectomy: no evidence of malignancy   12/07/2020 -  Chemotherapy   Patient is on Treatment Plan : BREAST ADO-Trastuzumab Emtansine (Kadcyla) q21d     01/04/2021 - 02/17/2021 Radiation Therapy   Adjuvant radiation     CHIEF COMPLIANT: [name]  INTERVAL HISTORY: Teresa Sheppard is a 60 y.o. with above-mentioned history of right breast cancer who had a right mastectomy and completed neoadjuvant chemotherapy and with [place], and is currently on [name]. She reports to the clinic today for treatment.  She does feel more fatigued and occasional dizziness but overall she is tolerating it fairly well.  Denies worsening neuropathy or nausea or vomiting.  ALLERGIES:  is allergic to shellfish-derived products and penicillins.  MEDICATIONS:  Current Outpatient Medications  Medication Sig Dispense Refill  acetaminophen (TYLENOL) 325 MG tablet Take 325 mg by mouth every 6 (six) hours as needed (headaches / pain).     Calcium-Vitamin D-Vitamin K ([facility]  CALCIUM SOFT CHEWS) 650-12.5-40 MG-MCG-MCG CHEW Chew 1 tablet by mouth 2 (two) times daily.     cholecalciferol (VITAMIN D3) 25 MCG (1000 UNIT) tablet Take 1,000 Units by mouth daily.     Cranberry-Vitamin C 250-60 MG CAPS Take 1 tablet by mouth in the morning and at bedtime.     hydrocortisone cream 1 % Apply 1 application topically 4 (four) times daily as needed for itching.     letrozole (FEMARA) 2.5 MG tablet Take 1 tablet (2.5 mg total) by mouth daily. 90 tablet 3   loratadine (CLARITIN) 10 MG tablet Take 10 mg by mouth daily.     Multiple Vitamins-Minerals (HAIR/SKIN/NAILS/BIOTIN PO) Take 1 capsule by mouth daily.     Polyethyl Glycol-Propyl Glycol (SYSTANE OP) Place 1 drop into both eyes daily as needed (dry eyes).     Wound Cleansers (RADIAPLEX EX) Apply 1 application topically daily as needed (radiation).     No current facility-administered medications for this visit.   Facility-Administered Medications Ordered in Other Visits  Medication Dose Route Frequency Provider Last Rate Last Admin   sodium chloride flush (NS) 0.9 % injection 10 mL  10 mL Intracatheter Once Nicholas Lose, MD        PHYSICAL EXAMINATION: ECOG PERFORMANCE STATUS: 1 - Symptomatic but completely ambulatory  Vitals:   04/10/21 1031  BP: 113/66  Pulse: 74  Resp: 16  Temp: 97.8 F (36.6 C)  SpO2: 99%   Filed Weights   04/10/21 1031  Weight: 146 lb 11.2 oz (66.5 kg)    LABORATORY DATA:  I have reviewed the data as listed CMP Latest Ref Rng & Units 03/22/2021 03/07/2021 03/01/2021  Glucose 70 - 99 mg/dL 87 81 78  BUN 6 - 20 mg/dL _0 Creatinine 0.44 - 1.00 mg/dL 0.76 0.77 0.81  Sodium 135 - 145 mmol/L 141 141 141  Potassium 3.5 - 5.1 mmol/L 3.8 3.7 3.9  Chloride 98 - 111 mmol/L 106 104 107  CO2 22 - 32 mmol/L _1 Calcium 8.9 - 10.3 mg/dL 9.9 10.0 10.0  Total Protein 6.5 - 8.1 g/dL 7.6 7.8 7.9  Total Bilirubin 0.3 - 1.2 mg/dL 0.6 0.5 0.4  Alkaline Phos 38 - 126 U/L 146(H) 142(H) 131(H)   AST 15 - 41 U/L 55(H) 64(H) 66(H)  ALT 0 - 44 U/L 42 44 58(H)    Lab Results  Component Value Date   WBC 3.3 (L) 04/10/2021   HGB 12.4 04/10/2021   HCT 36.6 04/10/2021   MCV 93.8 04/10/2021   PLT 120 (L) 04/10/2021   NEUTROABS 1.8 04/10/2021    ASSESSMENT & PLAN:  Malignant neoplasm of overlapping sites of right breast in female, estrogen receptor positive ([place]) 06/21/2020:Patient palpated a right breast mass x 1 wk. Mammogram showed multiple confluent masses in the right breast at the 7 o'clock position measuring 2.3cm and at the 9 o'clock position measuring 2.2cm with one right axillary lymph node with cortical thickening. Biopsy showed invasive mammary carcinoma at both positions and in the axilla, grade 2-3, HER-2 equivocal by IHC (2+), positive by FISH (ratio 3.34), ER+ >95%, PR+ 25%, Ki67 60%.   Treatment plan: 1. Neoadjuvant chemotherapy with TCH Perjeta 6 cycles completed 10/26/2020 followed by Herceptin Perjeta versus [name] for 1 year 2.  Right mastectomy [name]): IDC, grade 3,  spanning 4.0cm fibrotic area, clear margins, 1/4 right axillary lymph nodes positive for carcinoma, 0.5cm. Left lumpectomy: no evidence of malignancy 3. Followed by adjuvant radiation therapy completed 02/17/2021 4.  Followed by adjuvant antiestrogen therapy started 12/29/2020 5.  Followed by neratinib ---------------------------------------------------------------------------------------------------------------------------------- Current Treatment: [name] Kadcyla toxicities: Leukopenia: We reduced the dosage of Kadcyla. Mild thrombocytopenia: Stable Elevation of AST and ALT: Will be monitored closely with reducing the dosage of Kadcyla.  These numbers are stable   Letrozole toxicities: Tolerating it extremely well. Return to clinic every 3 weeks for Kadcyla    No orders of the defined types were placed in this encounter.  The patient has a good understanding of the  overall plan. she agrees with it. she will call with any problems that may develop before the next visit here.  Total time spent: 30 mins including face to face time and time spent for planning, charting and coordination of care  Rulon Eisenmenger, MD, MPH 04/10/2021  I, Thana Ates, am acting as scribe for Dr. Nicholas Lose.  I have reviewed the above documentation for accuracy and completeness, and I agree with the above.

## 2021-04-10 ENCOUNTER — Inpatient Hospital Stay: Payer: BC Managed Care – PPO

## 2021-04-10 ENCOUNTER — Other Ambulatory Visit: Payer: Self-pay

## 2021-04-10 ENCOUNTER — Other Ambulatory Visit: Payer: BC Managed Care – PPO

## 2021-04-10 ENCOUNTER — Inpatient Hospital Stay: Payer: BC Managed Care – PPO | Attending: Hematology and Oncology | Admitting: Hematology and Oncology

## 2021-04-10 DIAGNOSIS — Z5112 Encounter for antineoplastic immunotherapy: Secondary | ICD-10-CM | POA: Insufficient documentation

## 2021-04-10 DIAGNOSIS — C50811 Malignant neoplasm of overlapping sites of right female breast: Secondary | ICD-10-CM | POA: Diagnosis not present

## 2021-04-10 DIAGNOSIS — D6959 Other secondary thrombocytopenia: Secondary | ICD-10-CM | POA: Insufficient documentation

## 2021-04-10 DIAGNOSIS — Z170 Estrogen receptor positive status [ER+]: Secondary | ICD-10-CM | POA: Insufficient documentation

## 2021-04-10 LAB — CBC WITH DIFFERENTIAL (CANCER CENTER ONLY)
Abs Immature Granulocytes: 0.01 10*3/uL (ref 0.00–0.07)
Basophils Absolute: 0.1 10*3/uL (ref 0.0–0.1)
Basophils Relative: 2 %
Eosinophils Absolute: 0.3 10*3/uL (ref 0.0–0.5)
Eosinophils Relative: 9 %
HCT: 36.6 % (ref 36.0–46.0)
Hemoglobin: 12.4 g/dL (ref 12.0–15.0)
Immature Granulocytes: 0 %
Lymphocytes Relative: 21 %
Lymphs Abs: 0.7 10*3/uL (ref 0.7–4.0)
MCH: 31.8 pg (ref 26.0–34.0)
MCHC: 33.9 g/dL (ref 30.0–36.0)
MCV: 93.8 fL (ref 80.0–100.0)
Monocytes Absolute: 0.4 10*3/uL (ref 0.1–1.0)
Monocytes Relative: 13 %
Neutro Abs: 1.8 10*3/uL (ref 1.7–7.7)
Neutrophils Relative %: 55 %
Platelet Count: 120 10*3/uL — ABNORMAL LOW (ref 150–400)
RBC: 3.9 MIL/uL (ref 3.87–5.11)
RDW: 13 % (ref 11.5–15.5)
WBC Count: 3.3 10*3/uL — ABNORMAL LOW (ref 4.0–10.5)
nRBC: 0 % (ref 0.0–0.2)

## 2021-04-10 LAB — CMP (CANCER CENTER ONLY)
ALT: 39 U/L (ref 0–44)
AST: 59 U/L — ABNORMAL HIGH (ref 15–41)
Albumin: 3.6 g/dL (ref 3.5–5.0)
Alkaline Phosphatase: 148 U/L — ABNORMAL HIGH (ref 38–126)
Anion gap: 10 (ref 5–15)
BUN: 11 mg/dL (ref 6–20)
CO2: 23 mmol/L (ref 22–32)
Calcium: 9.6 mg/dL (ref 8.9–10.3)
Chloride: 106 mmol/L (ref 98–111)
Creatinine: 0.79 mg/dL (ref 0.44–1.00)
GFR, Estimated: >60 mL/min (ref >60)
Glucose, Bld: 98 mg/dL (ref 70–99)
Potassium: 3.9 mmol/L (ref 3.5–5.1)
Sodium: 139 mmol/L (ref 135–145)
Total Bilirubin: 0.6 mg/dL (ref 0.3–1.2)
Total Protein: 7.4 g/dL (ref 6.5–8.1)

## 2021-04-10 MED ORDER — ACETAMINOPHEN 325 MG PO TABS
650.0000 mg | ORAL_TABLET | Freq: Once | ORAL | Status: AC
  Administered 2021-04-10: 650 mg via ORAL
  Filled 2021-04-10: qty 2

## 2021-04-10 MED ORDER — DIPHENHYDRAMINE HCL 25 MG PO CAPS
50.0000 mg | ORAL_CAPSULE | Freq: Once | ORAL | Status: AC
  Administered 2021-04-10: 50 mg via ORAL
  Filled 2021-04-10: qty 2

## 2021-04-10 MED ORDER — SODIUM CHLORIDE 0.9 % IV SOLN: Freq: Once | INTRAVENOUS | Status: AC

## 2021-04-10 MED ORDER — SODIUM CHLORIDE 0.9 % IV SOLN
3.0000 mg/kg | Freq: Once | INTRAVENOUS | Status: AC
  Administered 2021-04-10: 200 mg via INTRAVENOUS
  Filled 2021-04-10: qty 10

## 2021-04-10 NOTE — Assessment & Plan Note (Signed)
06/21/2020:Patient palpated a right breast mass x 1 wk. Mammogram showed multiple confluent masses in the right breast at the 7 o'clock position measuring 2.3cm and at the 9 o'clock position measuring 2.2cm with one right axillary lymph node with cortical thickening. Biopsy showed invasive mammary carcinoma at both positions and in the axilla, grade 2-3, HER-2 equivocal by IHC (2+), positive by FISH (ratio 3.34), ER+ >95%, PR+ 25%, Ki67 60%.  Treatment plan: 1. Neoadjuvant chemotherapy with [place] Perjeta 6 cyclescompleted 5/4/2022followed by HerceptinPerjeta versus Kadcylamaintenance for 1 year 2.Right mastectomy Teresa Sheppard): IDC, grade 3, spanning 4.0cm fibrotic area, clear margins, 1/4 right axillary lymph nodes positive for carcinoma, 0.5cm. Left lumpectomy: no evidence of malignancy 3. Followed by adjuvant radiation therapycompleted 02/17/2021 4.Followed by adjuvant antiestrogen therapystarted 12/29/2020 5.Followed by neratinib ---------------------------------------------------------------------------------------------------------------------------------- Current Treatment: [name] Kadcyla toxicities: 1. Leukopenia: We reducedthe dosage of Kadcyla. 2. Mild thrombocytopenia: Stable 3. Elevation of AST and ALT: Will be monitored closely with reducing the dosage of Kadcyla.These numbers are stable  Letrozole toxicities: Tolerating it extremely well. Return to clinic every 3 weeks for Kadcyla

## 2021-04-10 NOTE — Patient Instructions (Signed)
[facility]   °Discharge Instructions: °Thank you for choosing [facility] to provide your oncology and hematology care.  ° °If you have a lab appointment with the Cancer Center, please go directly to the Cancer Center and check in at the registration area. °  °Wear comfortable clothing and clothing appropriate for easy access to any Portacath or PICC line.  ° °We strive to give you quality time with your provider. You may need to reschedule your appointment if you arrive late (15 or more minutes).  Arriving late affects you and other patients whose appointments are after yours.  Also, if you miss three or more appointments without notifying the office, you may be dismissed from the clinic at the provider’s discretion.    °  °For prescription refill requests, have your pharmacy contact our office and allow 72 hours for refills to be completed.   ° °Today you received the following chemotherapy and/or immunotherapy agents: ado-trastuzumab emtansine    °  °To help prevent nausea and vomiting after your treatment, we encourage you to take your nausea medication as directed. ° °BELOW ARE SYMPTOMS THAT SHOULD BE REPORTED IMMEDIATELY: °*FEVER GREATER THAN 100.4 F (38 °C) OR HIGHER °*CHILLS OR SWEATING °*NAUSEA AND VOMITING THAT IS NOT CONTROLLED WITH YOUR NAUSEA MEDICATION °*UNUSUAL SHORTNESS OF BREATH °*UNUSUAL BRUISING OR BLEEDING °*URINARY PROBLEMS (pain or burning when urinating, or frequent urination) °*BOWEL PROBLEMS (unusual diarrhea, constipation, pain near the anus) °TENDERNESS IN MOUTH AND THROAT WITH OR WITHOUT PRESENCE OF ULCERS (sore throat, sores in mouth, or a toothache) °UNUSUAL RASH, SWELLING OR PAIN  °UNUSUAL VAGINAL DISCHARGE OR ITCHING  ° °Items with * indicate a potential emergency and should be followed up as soon as possible or go to the Emergency Department if any problems should occur. ° °Please show the CHEMOTHERAPY ALERT CARD or IMMUNOTHERAPY ALERT  CARD at check-in to the Emergency Department and triage nurse. ° °Should you have questions after your visit or need to cancel or reschedule your appointment, please contact [facility]  Dept: [phone]  and follow the prompts.  Office hours are 8:00 a.m. to 4:30 p.m. Monday - Friday. Please note that voicemails left after 4:00 p.m. may not be returned until the following business day.  We are closed weekends and major holidays. You have access to a nurse at all times for urgent questions. Please call the main number to the clinic Dept: [phone] and follow the prompts. ° ° °For any non-urgent questions, you may also contact your provider using MyChart. We now offer e-Visits for anyone 18 and older to request care online for non-urgent symptoms. For details visit [email or web address]. °  °Also download the MyChart app! Go to the app store, search "MyChart", open the app, select [facility], and log in with your MyChart username and password. ° °Due to Covid, a mask is required upon entering the hospital/clinic. If you do not have a mask, one will be given to you upon arrival. For doctor visits, patients may have 1 support person aged 18 or older with them. For treatment visits, patients cannot have anyone with them due to current Covid guidelines and our immunocompromised population.  ° °

## 2021-04-12 ENCOUNTER — Other Ambulatory Visit: Payer: BC Managed Care – PPO

## 2021-04-12 ENCOUNTER — Ambulatory Visit: Payer: BC Managed Care – PPO

## 2021-04-12 ENCOUNTER — Ambulatory Visit: Payer: BC Managed Care – PPO | Admitting: Hematology and Oncology

## 2021-04-12 NOTE — Progress Notes (Signed)
Patient should undergo FNA biopsy of 2.8 cm left isthmus nodule as recommended by the radiologist based on her USN study.  [place], MD [facility] practice Office: [phone]

## 2021-04-18 ENCOUNTER — Other Ambulatory Visit: Payer: Self-pay | Admitting: Surgery

## 2021-04-18 DIAGNOSIS — E041 Nontoxic single thyroid nodule: Secondary | ICD-10-CM

## 2021-04-25 ENCOUNTER — Encounter: Payer: Self-pay | Admitting: Hematology and Oncology

## 2021-04-26 DIAGNOSIS — E0590 Thyrotoxicosis, unspecified without thyrotoxic crisis or storm: Secondary | ICD-10-CM | POA: Diagnosis not present

## 2021-05-01 NOTE — Progress Notes (Signed)
Patient Care Team: Wenda Low, MD as PCP - General (Internal Medicine) Rockwell Germany, RN as Oncology Nurse Navigator Mauro Kaufmann, RN as Oncology Nurse Navigator [name], MD as Consulting Physician (General Surgery) Nicholas Lose, MD as Consulting Physician (Hematology and Oncology) Kyung Rudd, MD as Consulting Physician (Radiation Oncology)  DIAGNOSIS:    ICD-10-CM   1. Malignant neoplasm of overlapping sites of right breast in female, estrogen receptor positive ([place])  C50.811    Z17.0       SUMMARY OF ONCOLOGIC HISTORY: Oncology History  Malignant neoplasm of overlapping sites of right breast in female, estrogen receptor positive ([place])  06/21/2020 Initial Diagnosis   Patient palpated a right breast mass x 1 wk. Mammogram showed multiple confluent masses in the right breast at the 7 o'clock position measuring 2.3cm and at the 9 o'clock position measuring 2.2cm with one right axillary lymph node with cortical thickening. Biopsy showed invasive mammary carcinoma at both positions and in the axilla, grade 2-3, HER-2 equivocal by IHC (2+), positive by FISH (ratio 3.34), ER+ >95%, PR+ 25%, Ki67 60%.   06/29/2020 Cancer Staging   Staging form: Breast, AJCC 8th Edition - Clinical stage from 06/29/2020: Stage IB (cT2, cN1(f), cM0, G3, ER+, PR+, HER2+) - Signed by Nicholas Lose, MD on 06/29/2020    07/13/2020 - 10/26/2020 Chemotherapy      Patient is on Antibody Plan: BREAST TRASTUZUMAB + PERTUZUMAB Q21D     10/28/2020 Breast MRI   Complete imaging response   11/14/2020 Genetic Testing   Negative hereditary cancer genetic testing: no pathogenic variants detected in [facility] BRCAPlus Panel and [facility] CancerNext-Expanded +RNAinsight Panel.  The report dates are Nov 14, 2020 and December 05, 2020, respectively.    The BRCAplus panel offered by [employer] and includes sequencing and deletion/duplication analysis for the following 8 genes: ATM, BRCA1, BRCA2, CDH1, CHEK2, PALB2,  PTEN, and TP53.  The CancerNext-Expanded gene panel offered by [facility] and includes sequencing, rearrangement, and RNA analysis for the following 77 genes: AIP, ALK, APC, ATM, AXIN2, BAP1, BARD1, BLM, BMPR1A, BRCA1, BRCA2, BRIP1, CDC73, CDH1, CDK4, CDKN1B, CDKN2A, CHEK2, CTNNA1, DICER1, FANCC, FH, FLCN, GALNT12, KIF1B, LZTR1, MAX, MEN1, MET, MLH1, MSH2, MSH3, MSH6, MUTYH, NBN, NF1, NF2, NTHL1, PALB2, PHOX2B, PMS2, POT1, PRKAR1A, PTCH1, PTEN, RAD51C, RAD51D, RB1, RECQL, RET, SDHA, SDHAF2, SDHB, SDHC, SDHD, SMAD4, SMARCA4, SMARCB1, SMARCE1, STK11, SUFU, TMEM127, TP53, TSC1, TSC2, VHL and XRCC2 (sequencing and deletion/duplication); EGFR, EGLN1, HOXB13, KIT, MITF, PDGFRA, POLD1, and POLE (sequencing only); EPCAM and GREM1 (deletion/duplication only).    11/28/2020 Surgery   Right mastectomy [name]): IDC, grade 3, spanning 4.0cm fibrotic area, clear margins, 1/4 right axillary lymph nodes positive for carcinoma, 0.5cm.   Left lumpectomy: no evidence of malignancy   12/07/2020 -  Chemotherapy   Patient is on Treatment Plan : BREAST ADO-Trastuzumab Emtansine (Kadcyla) q21d     01/04/2021 - 02/17/2021 Radiation Therapy   Adjuvant radiation     CHIEF COMPLIANT: [name]  INTERVAL HISTORY: Teresa Sheppard is a 60 y.o. with above-mentioned history of right breast cancer who had a right mastectomy and completed neoadjuvant chemotherapy and with [place], and is currently on [name]. She reports to the clinic today for treatment.  She continues to suffer from peripheral neuropathy.  She feels like that there is something under the feet all the time.  She has not fallen.  She does occasionally feel dizzy and lightheaded.  This gets better with movement.  ALLERGIES:  is allergic  to shellfish-derived products and penicillins.  MEDICATIONS:  Current Outpatient Medications  Medication Sig Dispense Refill   acetaminophen (TYLENOL) 325 MG tablet Take 325 mg by mouth every 6  (six) hours as needed (headaches / pain).     Calcium-Vitamin D-Vitamin K ([facility] CALCIUM SOFT CHEWS) 650-12.5-40 MG-MCG-MCG CHEW Chew 1 tablet by mouth 2 (two) times daily.     cholecalciferol (VITAMIN D3) 25 MCG (1000 UNIT) tablet Take 1,000 Units by mouth daily.     Cranberry-Vitamin C 250-60 MG CAPS Take 1 tablet by mouth in the morning and at bedtime.     hydrocortisone cream 1 % Apply 1 application topically 4 (four) times daily as needed for itching.     letrozole (FEMARA) 2.5 MG tablet Take 1 tablet (2.5 mg total) by mouth daily. 90 tablet 3   loratadine (CLARITIN) 10 MG tablet Take 10 mg by mouth daily.     Multiple Vitamins-Minerals (HAIR/SKIN/NAILS/BIOTIN PO) Take 1 capsule by mouth daily.     Polyethyl Glycol-Propyl Glycol (SYSTANE OP) Place 1 drop into both eyes daily as needed (dry eyes).     Wound Cleansers (RADIAPLEX EX) Apply 1 application topically daily as needed (radiation).     No current facility-administered medications for this visit.   Facility-Administered Medications Ordered in Other Visits  Medication Dose Route Frequency Provider Last Rate Last Admin   sodium chloride flush (NS) 0.9 % injection 10 mL  10 mL Intracatheter Once Nicholas Lose, MD        PHYSICAL EXAMINATION: ECOG PERFORMANCE STATUS: 1 - Symptomatic but completely ambulatory  Vitals:   05/02/21 0956  BP: 117/61  Pulse: 64  Resp: 18  Temp: 97.9 F (36.6 C)  SpO2: 100%   Filed Weights   05/02/21 0956  Weight: 145 lb 9.6 oz (66 kg)    LABORATORY DATA:  I have reviewed the data as listed CMP Latest Ref Rng & Units 05/02/2021 04/10/2021 03/22/2021  Glucose 70 - 99 mg/dL 103(H) 98 87  BUN 6 - 20 mg/dL _0 Creatinine 0.44 - 1.00 mg/dL 0.77 0.79 0.76  Sodium 135 - 145 mmol/L 141 139 141  Potassium 3.5 - 5.1 mmol/L 3.7 3.9 3.8  Chloride 98 - 111 mmol/L 107 106 106  CO2 22 - 32 mmol/L _1 Calcium 8.9 - 10.3 mg/dL 9.4 9.6 9.9  Total Protein 6.5 - 8.1 g/dL 7.6 7.4 7.6  Total  Bilirubin 0.3 - 1.2 mg/dL 0.6 0.6 0.6  Alkaline Phos 38 - 126 U/L 134(H) 148(H) 146(H)  AST 15 - 41 U/L 53(H) 59(H) 55(H)  ALT 0 - 44 U/L 34 39 42    Lab Results  Component Value Date   WBC 2.8 (L) 05/02/2021   HGB 12.2 05/02/2021   HCT 36.8 05/02/2021   MCV 94.1 05/02/2021   PLT 108 (L) 05/02/2021   NEUTROABS 1.4 (L) 05/02/2021    ASSESSMENT & PLAN:  Malignant neoplasm of overlapping sites of right breast in female, estrogen receptor positive ([place]) 06/21/2020:Patient palpated a right breast mass x 1 wk. Mammogram showed multiple confluent masses in the right breast at the 7 o'clock position measuring 2.3cm and at the 9 o'clock position measuring 2.2cm with one right axillary lymph node with cortical thickening. Biopsy showed invasive mammary carcinoma at both positions and in the axilla, grade 2-3, HER-2 equivocal by IHC (2+), positive by FISH (ratio 3.34), ER+ >95%, PR+ 25%, Ki67 60%.   Treatment plan: 1. Neoadjuvant chemotherapy with TCH Perjeta 6 cycles completed  10/26/2020 followed by Herceptin Perjeta versus [name] for 1 year 2.  Right mastectomy [name]): IDC, grade 3, spanning 4.0cm fibrotic area, clear margins, 1/4 right axillary lymph nodes positive for carcinoma, 0.5cm. Left lumpectomy: no evidence of malignancy 3. Followed by adjuvant radiation therapy completed 02/17/2021 4.  Followed by adjuvant antiestrogen therapy started 12/29/2020 5.  Followed by neratinib ---------------------------------------------------------------------------------------------------------------------------------- Current Treatment: [name] with letrozole Kadcyla toxicities: Leukopenia: We reduced the dosage of Kadcyla. Mild thrombocytopenia: Stable Elevation of AST and ALT: Will be monitored closely with reducing the dosage of Kadcyla.  These numbers are stable  4.  Peripheral neuropathy: Concerning.  We will reduce the dosage of Kadcyla.  Letrozole toxicities:  Tolerating it extremely well. We discussed the role of neratinib and she is willing to take it once a Kadcyla is completed.  (Probably starting June/July 2023) Return to clinic every 3 weeks for Kadcyla    No orders of the defined types were placed in this encounter.  The patient has a good understanding of the overall plan. she agrees with it. she will call with any problems that may develop before the next visit here.  Total time spent: 30 mins including face to face time and time spent for planning, charting and coordination of care  Rulon Eisenmenger, MD, MPH 05/02/2021  I, Thana Ates, am acting as scribe for Dr. Nicholas Lose.  I have reviewed the above documentation for accuracy and completeness, and I agree with the above.

## 2021-05-02 ENCOUNTER — Other Ambulatory Visit: Payer: Self-pay

## 2021-05-02 ENCOUNTER — Inpatient Hospital Stay (HOSPITAL_BASED_OUTPATIENT_CLINIC_OR_DEPARTMENT_OTHER): Payer: BC Managed Care – PPO | Admitting: Hematology and Oncology

## 2021-05-02 ENCOUNTER — Inpatient Hospital Stay: Payer: BC Managed Care – PPO

## 2021-05-02 ENCOUNTER — Inpatient Hospital Stay: Payer: BC Managed Care – PPO | Attending: Hematology and Oncology

## 2021-05-02 VITALS — BP 118/70 | HR 58 | Temp 97.6°F | Resp 16

## 2021-05-02 DIAGNOSIS — Z170 Estrogen receptor positive status [ER+]: Secondary | ICD-10-CM | POA: Insufficient documentation

## 2021-05-02 DIAGNOSIS — Z5112 Encounter for antineoplastic immunotherapy: Secondary | ICD-10-CM | POA: Insufficient documentation

## 2021-05-02 DIAGNOSIS — C50811 Malignant neoplasm of overlapping sites of right female breast: Secondary | ICD-10-CM

## 2021-05-02 DIAGNOSIS — G629 Polyneuropathy, unspecified: Secondary | ICD-10-CM | POA: Diagnosis not present

## 2021-05-02 LAB — CMP (CANCER CENTER ONLY)
ALT: 34 U/L (ref 0–44)
AST: 53 U/L — ABNORMAL HIGH (ref 15–41)
Albumin: 3.7 g/dL (ref 3.5–5.0)
Alkaline Phosphatase: 134 U/L — ABNORMAL HIGH (ref 38–126)
Anion gap: 10 (ref 5–15)
BUN: 10 mg/dL (ref 6–20)
CO2: 24 mmol/L (ref 22–32)
Calcium: 9.4 mg/dL (ref 8.9–10.3)
Chloride: 107 mmol/L (ref 98–111)
Creatinine: 0.77 mg/dL (ref 0.44–1.00)
GFR, Estimated: >60 mL/min (ref >60)
Glucose, Bld: 103 mg/dL — ABNORMAL HIGH (ref 70–99)
Potassium: 3.7 mmol/L (ref 3.5–5.1)
Sodium: 141 mmol/L (ref 135–145)
Total Bilirubin: 0.6 mg/dL (ref 0.3–1.2)
Total Protein: 7.6 g/dL (ref 6.5–8.1)

## 2021-05-02 LAB — CBC WITH DIFFERENTIAL (CANCER CENTER ONLY)
Abs Immature Granulocytes: 0.01 10*3/uL (ref 0.00–0.07)
Basophils Absolute: 0.1 10*3/uL (ref 0.0–0.1)
Basophils Relative: 3 %
Eosinophils Absolute: 0.2 10*3/uL (ref 0.0–0.5)
Eosinophils Relative: 9 %
HCT: 36.8 % (ref 36.0–46.0)
Hemoglobin: 12.2 g/dL (ref 12.0–15.0)
Immature Granulocytes: 0 %
Lymphocytes Relative: 26 %
Lymphs Abs: 0.7 10*3/uL (ref 0.7–4.0)
MCH: 31.2 pg (ref 26.0–34.0)
MCHC: 33.2 g/dL (ref 30.0–36.0)
MCV: 94.1 fL (ref 80.0–100.0)
Monocytes Absolute: 0.3 10*3/uL (ref 0.1–1.0)
Monocytes Relative: 12 %
Neutro Abs: 1.4 10*3/uL — ABNORMAL LOW (ref 1.7–7.7)
Neutrophils Relative %: 50 %
Platelet Count: 108 10*3/uL — ABNORMAL LOW (ref 150–400)
RBC: 3.91 MIL/uL (ref 3.87–5.11)
RDW: 12.8 % (ref 11.5–15.5)
WBC Count: 2.8 10*3/uL — ABNORMAL LOW (ref 4.0–10.5)
nRBC: 0 % (ref 0.0–0.2)

## 2021-05-02 MED ORDER — DIPHENHYDRAMINE HCL 25 MG PO CAPS
50.0000 mg | ORAL_CAPSULE | Freq: Once | ORAL | Status: AC
  Administered 2021-05-02: 50 mg via ORAL
  Filled 2021-05-02: qty 2

## 2021-05-02 MED ORDER — SODIUM CHLORIDE 0.9 % IV SOLN: Freq: Once | INTRAVENOUS | Status: AC

## 2021-05-02 MED ORDER — ACETAMINOPHEN 325 MG PO TABS
650.0000 mg | ORAL_TABLET | Freq: Once | ORAL | Status: AC
  Administered 2021-05-02: 650 mg via ORAL
  Filled 2021-05-02: qty 2

## 2021-05-02 MED ORDER — SODIUM CHLORIDE 0.9 % IV SOLN
2.4000 mg/kg | Freq: Once | INTRAVENOUS | Status: AC
  Administered 2021-05-02: 160 mg via INTRAVENOUS
  Filled 2021-05-02: qty 8

## 2021-05-02 NOTE — Progress Notes (Signed)
Per [name], ok for treatment today with ANC 1.4. [name] states he dose reduced her treatment. Pt. ceclines to stay for 30 minute post Kadcyla observation. Vital signs stable, denies complaints of chest pain, dizziness, no shortness of breath noted.

## 2021-05-02 NOTE — Assessment & Plan Note (Signed)
06/21/2020:Patient palpated a right breast mass x 1 wk. Mammogram showed multiple confluent masses in the right breast at the 7 o'clock position measuring 2.3cm and at the 9 o'clock position measuring 2.2cm with one right axillary lymph node with cortical thickening. Biopsy showed invasive mammary carcinoma at both positions and in the axilla, grade 2-3, HER-2 equivocal by IHC (2+), positive by FISH (ratio 3.34), ER+ >95%, PR+ 25%, Ki67 60%.  Treatment plan: 1. Neoadjuvant chemotherapy with [place] Perjeta 6 cyclescompleted 5/4/2022followed by HerceptinPerjeta versus Kadcylamaintenance for 1 year 2.Right mastectomy [name]): IDC, grade 3, spanning 4.0cm fibrotic area, clear margins, 1/4 right axillary lymph nodes positive for carcinoma, 0.5cm. Left lumpectomy: no evidence of malignancy 3. Followed by adjuvant radiation therapycompleted 02/17/2021 4.Followed by adjuvant antiestrogen therapystarted 12/29/2020 5.Followed by neratinib ---------------------------------------------------------------------------------------------------------------------------------- Current Treatment: [name] with letrozole Kadcyla toxicities: 1. Leukopenia: We reducedthe dosage of Kadcyla. 2. Mild thrombocytopenia: Stable 3. Elevation of AST and ALT: Will be monitored closely with reducing the dosage of Kadcyla.These numbers are stable  Letrozole toxicities: Tolerating it extremely well. Return to clinic every 3 weeks for Kadcyla

## 2021-05-02 NOTE — Patient Instructions (Signed)
[facility] MED[facility]COLOG[facility]   Discharge Instructions: Thank you for choosing [facility] to provide your oncology and hematology care.   If you have a lab appointment with the Cancer Center, please go directly to the Cancer Center and check in at the registration area.   Wear comfortable clothing and clothing appropriate for easy access to any Portacath or PICC line.   We strive to give you quality time with your provider. You may need to reschedule your appointment if you arrive late (15 or more minutes).  Arriving late affects you and other patients whose appointments are after yours.  Also, if you miss three or more appointments without notifying the office, you may be dismissed from the clinic at the provider's discretion.      For prescription refill requests, have your pharmacy contact our office and allow 72 hours for refills to be completed.    Today you received the following chemotherapy and/or immunotherapy agent: Trastuzumab (Kadcyla)   To help prevent nausea and vomiting after your treatment, we encourage you to take your nausea medication as directed.  BELOW ARE SYMPTOMS THAT SHOULD BE REPORTED IMMEDIATELY: *FEVER GREATER THAN 100.4 F (38 C) OR HIGHER *CHILLS OR SWEATING *NAUSEA AND VOMITING THAT IS NOT CONTROLLED WITH YOUR NAUSEA MEDICATION *UNUSUAL SHORTNESS OF BREATH *UNUSUAL BRUISING OR BLEEDING *URINARY PROBLEMS (pain or burning when urinating, or frequent urination) *BOWEL PROBLEMS (unusual diarrhea, constipation, pain near the anus) TENDERNESS IN MOUTH AND THROAT WITH OR WITHOUT PRESENCE OF ULCERS (sore throat, sores in mouth, or a toothache) UNUSUAL RASH, SWELLING OR PAIN  UNUSUAL VAGINAL DISCHARGE OR ITCHING   Items with * indicate a potential emergency and should be followed up as soon as possible or go to the Emergency Department if any problems should occur.  Please show the CHEMOTHERAPY ALERT CARD or IMMUNOTHERAPY ALERT CARD at  check-in to the Emergency Department and triage nurse.  Should you have questions after your visit or need to cancel or reschedule your appointment, please contact [facility]  Dept: [phone]  and follow the prompts.  Office hours are 8:00 a.m. to 4:30 p.m. Monday - Friday. Please note that voicemails left after 4:00 p.m. may not be returned until the following business day.  We are closed weekends and major holidays. You have access to a nurse at all times for urgent questions. Please call the main number to the clinic Dept: [phone] and follow the prompts.   For any non-urgent questions, you may also contact your provider using MyChart. We now offer e-Visits for anyone 18 and older to request care online for non-urgent symptoms. For details visit [email or web address].   Also download the MyChart app! Go to the app store, search "MyChart", open the app, select [facility], and log in with your MyChart username and password.  Due to Covid, a mask is required upon entering the hospital/clinic. If you do not have a mask, one will be given to you upon arrival. For doctor visits, patients may have 1 support person aged 18 or older with them. For treatment visits, patients cannot have anyone with them due to current Covid guidelines and our immunocompromised population.   

## 2021-05-03 ENCOUNTER — Other Ambulatory Visit: Payer: BC Managed Care – PPO

## 2021-05-03 ENCOUNTER — Ambulatory Visit: Payer: BC Managed Care – PPO

## 2021-05-04 DIAGNOSIS — C50811 Malignant neoplasm of overlapping sites of right female breast: Secondary | ICD-10-CM | POA: Diagnosis not present

## 2021-05-04 DIAGNOSIS — Z170 Estrogen receptor positive status [ER+]: Secondary | ICD-10-CM | POA: Diagnosis not present

## 2021-05-05 ENCOUNTER — Encounter: Payer: Self-pay | Admitting: Hematology and Oncology

## 2021-05-08 ENCOUNTER — Encounter: Payer: Self-pay | Admitting: *Deleted

## 2021-05-08 DIAGNOSIS — E0590 Thyrotoxicosis, unspecified without thyrotoxic crisis or storm: Secondary | ICD-10-CM | POA: Diagnosis not present

## 2021-05-08 DIAGNOSIS — E042 Nontoxic multinodular goiter: Secondary | ICD-10-CM | POA: Diagnosis not present

## 2021-05-08 DIAGNOSIS — R1310 Dysphagia, unspecified: Secondary | ICD-10-CM | POA: Diagnosis not present

## 2021-05-10 ENCOUNTER — Other Ambulatory Visit: Payer: Self-pay | Admitting: *Deleted

## 2021-05-10 ENCOUNTER — Other Ambulatory Visit: Payer: Self-pay

## 2021-05-10 ENCOUNTER — Ambulatory Visit (HOSPITAL_COMMUNITY)
Admission: RE | Admit: 2021-05-10 | Discharge: 2021-05-10 | Disposition: A | Discharge status: Home / Self Care | Payer: BC Managed Care – PPO | Source: Ambulatory Visit | Attending: Hematology and Oncology | Admitting: Hematology and Oncology

## 2021-05-10 DIAGNOSIS — C50811 Malignant neoplasm of overlapping sites of right female breast: Secondary | ICD-10-CM | POA: Insufficient documentation

## 2021-05-10 DIAGNOSIS — R42 Dizziness and giddiness: Secondary | ICD-10-CM

## 2021-05-10 DIAGNOSIS — Z170 Estrogen receptor positive status [ER+]: Secondary | ICD-10-CM | POA: Diagnosis not present

## 2021-05-10 DIAGNOSIS — C50919 Malignant neoplasm of unspecified site of unspecified female breast: Secondary | ICD-10-CM | POA: Diagnosis not present

## 2021-05-10 DIAGNOSIS — I6782 Cerebral ischemia: Secondary | ICD-10-CM | POA: Diagnosis not present

## 2021-05-10 MED ORDER — GADOBUTROL 1 MMOL/ML IV SOLN
6.0000 mL | Freq: Once | INTRAVENOUS | Status: AC | PRN
  Administered 2021-05-10: 6 mL via INTRAVENOUS

## 2021-05-10 NOTE — Progress Notes (Signed)
Received mychart message from pt with complaint of lightheadedness when walking/stopping and changing direction.  Per MD pt needing STAT Brain MRI for further evaluation.  Orders placed, RN will schedule appt once PA is obtained.

## 2021-05-10 NOTE — Progress Notes (Signed)
Per MD pt Brain MRI for metastatic disease.  Verbal orders received to refer pt to physical therapy for vestibular evaluation and tx.  Pt notified and orders placed.

## 2021-05-11 ENCOUNTER — Inpatient Hospital Stay: Admission: RE | Admit: 2021-05-11 | Payer: BC Managed Care – PPO | Source: Ambulatory Visit

## 2021-05-16 DIAGNOSIS — M8589 Other specified disorders of bone density and structure, multiple sites: Secondary | ICD-10-CM | POA: Diagnosis not present

## 2021-05-16 DIAGNOSIS — Z780 Asymptomatic menopausal state: Secondary | ICD-10-CM | POA: Diagnosis not present

## 2021-05-22 NOTE — Assessment & Plan Note (Signed)
06/21/2020:Patient palpated a right breast mass x 1 wk. Mammogram showed multiple confluent masses in the right breast at the 7 o'clock position measuring 2.3cm and at the 9 o'clock position measuring 2.2cm with one right axillary lymph node with cortical thickening. Biopsy showed invasive mammary carcinoma at both positions and in the axilla, grade 2-3, HER-2 equivocal by IHC (2+), positive by FISH (ratio 3.34), ER+ >95%, PR+ 25%, Ki67 60%.  Treatment plan: 1. Neoadjuvant chemotherapy with [place] Perjeta 6 cyclescompleted 5/4/2022followed by HerceptinPerjeta versus Kadcylamaintenance for 1 year 2.Right mastectomy [name]): IDC, grade 3, spanning 4.0cm fibrotic area, clear margins, 1/4 right axillary lymph nodes positive for carcinoma, 0.5cm. Left lumpectomy: no evidence of malignancy 3. Followed by adjuvant radiation therapycompleted 02/17/2021 4.Followed by adjuvant antiestrogen therapystarted 12/29/2020 5.Followed by neratinib ---------------------------------------------------------------------------------------------------------------------------------- Current Treatment: [name] with letrozole Kadcyla toxicities: 1. Leukopenia: We reducedthe dosage of Kadcyla. 2. Mild thrombocytopenia: Stable 3. Elevation of AST and ALT: Will be monitored closely with reducing the dosage of Kadcyla.These numbers are stable 4.  Peripheral neuropathy: Concerning.  We will reduce the dosage of Kadcyla.  Letrozole toxicities: Tolerating it extremely well. We discussed the role of neratinib and she is willing to take it once a Kadcyla is completed.  (Probably starting June/July 2023) Return to clinic every 3 weeks for [facility]

## 2021-05-22 NOTE — Progress Notes (Signed)
Patient Care Team: Wenda Low, MD as PCP - General (Internal Medicine) Rockwell Germany, RN as Oncology Nurse Navigator Mauro Kaufmann, RN as Oncology Nurse Navigator [name], MD as Consulting Physician (General Surgery) Nicholas Lose, MD as Consulting Physician (Hematology and Oncology) Kyung Rudd, MD as Consulting Physician (Radiation Oncology)  DIAGNOSIS:    ICD-10-CM   1. Malignant neoplasm of overlapping sites of right breast in female, estrogen receptor positive ([place])  C50.811    Z17.0       SUMMARY OF ONCOLOGIC HISTORY: Oncology History  Malignant neoplasm of overlapping sites of right breast in female, estrogen receptor positive ([place])  06/21/2020 Initial Diagnosis   Patient palpated a right breast mass x 1 wk. Mammogram showed multiple confluent masses in the right breast at the 7 o'clock position measuring 2.3cm and at the 9 o'clock position measuring 2.2cm with one right axillary lymph node with cortical thickening. Biopsy showed invasive mammary carcinoma at both positions and in the axilla, grade 2-3, HER-2 equivocal by IHC (2+), positive by FISH (ratio 3.34), ER+ >95%, PR+ 25%, Ki67 60%.   06/29/2020 Cancer Staging   Staging form: Breast, AJCC 8th Edition - Clinical stage from 06/29/2020: Stage IB (cT2, cN1(f), cM0, G3, ER+, PR+, HER2+) - Signed by Nicholas Lose, MD on 06/29/2020    07/13/2020 - 10/26/2020 Chemotherapy      Patient is on Antibody Plan: BREAST TRASTUZUMAB + PERTUZUMAB Q21D     10/28/2020 Breast MRI   Complete imaging response   11/14/2020 Genetic Testing   Negative hereditary cancer genetic testing: no pathogenic variants detected in [facility] BRCAPlus Panel and [facility] CancerNext-Expanded +RNAinsight Panel.  The report dates are Nov 14, 2020 and December 05, 2020, respectively.    The BRCAplus panel offered by [employer] and includes sequencing and deletion/duplication analysis for the following 8 genes: ATM, BRCA1, BRCA2, CDH1, CHEK2, PALB2,  PTEN, and TP53.  The CancerNext-Expanded gene panel offered by [facility] and includes sequencing, rearrangement, and RNA analysis for the following 77 genes: AIP, ALK, APC, ATM, AXIN2, BAP1, BARD1, BLM, BMPR1A, BRCA1, BRCA2, BRIP1, CDC73, CDH1, CDK4, CDKN1B, CDKN2A, CHEK2, CTNNA1, DICER1, FANCC, FH, FLCN, GALNT12, KIF1B, LZTR1, MAX, MEN1, MET, MLH1, MSH2, MSH3, MSH6, MUTYH, NBN, NF1, NF2, NTHL1, PALB2, PHOX2B, PMS2, POT1, PRKAR1A, PTCH1, PTEN, RAD51C, RAD51D, RB1, RECQL, RET, SDHA, SDHAF2, SDHB, SDHC, SDHD, SMAD4, SMARCA4, SMARCB1, SMARCE1, STK11, SUFU, TMEM127, TP53, TSC1, TSC2, VHL and XRCC2 (sequencing and deletion/duplication); EGFR, EGLN1, HOXB13, KIT, MITF, PDGFRA, POLD1, and POLE (sequencing only); EPCAM and GREM1 (deletion/duplication only).    11/28/2020 Surgery   Right mastectomy [name]): IDC, grade 3, spanning 4.0cm fibrotic area, clear margins, 1/4 right axillary lymph nodes positive for carcinoma, 0.5cm.   Left lumpectomy: no evidence of malignancy   12/07/2020 -  Chemotherapy   Patient is on Treatment Plan : BREAST ADO-Trastuzumab Emtansine (Kadcyla) q21d     01/04/2021 - 02/17/2021 Radiation Therapy   Adjuvant radiation     CHIEF COMPLIANT: [name]  INTERVAL HISTORY: Teresa Sheppard is a 60 y.o. with above-mentioned history of right breast cancer who had a right mastectomy and completed neoadjuvant chemotherapy and with [place], and is currently on [name]. She reports to the clinic today for treatment.  She appears to be emotionally distressed because of her fatigue as well as neuropathy symptoms.  She has noticed numbness and tingling of the toes.  ALLERGIES:  is allergic to shellfish-derived products and penicillins.  MEDICATIONS:  Current Outpatient Medications  Medication Sig Dispense  Refill   acetaminophen (TYLENOL) 325 MG tablet Take 325 mg by mouth every 6 (six) hours as needed (headaches / pain).     Calcium-Vitamin D-Vitamin K ([employer]  CALCIUM SOFT CHEWS) 650-12.5-40 MG-MCG-MCG CHEW Chew 1 tablet by mouth 2 (two) times daily.     cholecalciferol (VITAMIN D3) 25 MCG (1000 UNIT) tablet Take 1,000 Units by mouth daily.     Cranberry-Vitamin C 250-60 MG CAPS Take 1 tablet by mouth in the morning and at bedtime.     hydrocortisone cream 1 % Apply 1 application topically 4 (four) times daily as needed for itching.     letrozole (FEMARA) 2.5 MG tablet Take 1 tablet (2.5 mg total) by mouth daily. 90 tablet 3   loratadine (CLARITIN) 10 MG tablet Take 10 mg by mouth daily.     Multiple Vitamins-Minerals (HAIR/SKIN/NAILS/BIOTIN PO) Take 1 capsule by mouth daily.     Polyethyl Glycol-Propyl Glycol (SYSTANE OP) Place 1 drop into both eyes daily as needed (dry eyes).     Wound Cleansers (RADIAPLEX EX) Apply 1 application topically daily as needed (radiation).     No current facility-administered medications for this visit.   Facility-Administered Medications Ordered in Other Visits  Medication Dose Route Frequency Provider Last Rate Last Admin   sodium chloride flush (NS) 0.9 % injection 10 mL  10 mL Intracatheter Once Nicholas Lose, MD       sodium chloride flush (NS) 0.9 % injection 10 mL  10 mL Intracatheter Once Nicholas Lose, MD        PHYSICAL EXAMINATION: ECOG PERFORMANCE STATUS: 1 - Symptomatic but completely ambulatory  There were no vitals filed for this visit. There were no vitals filed for this visit.  LABORATORY DATA:  I have reviewed the data as listed CMP Latest Ref Rng & Units 05/02/2021 04/10/2021 03/22/2021  Glucose 70 - 99 mg/dL 103(H) 98 87  BUN 6 - 20 mg/dL 10 11 11   Creatinine 0.44 - 1.00 mg/dL 0.77 0.79 0.76  Sodium 135 - 145 mmol/L 141 139 141  Potassium 3.5 - 5.1 mmol/L 3.7 3.9 3.8  Chloride 98 - 111 mmol/L 107 106 106  CO2 22 - 32 mmol/L 24 23 24   Calcium 8.9 - 10.3 mg/dL 9.4 9.6 9.9  Total Protein 6.5 - 8.1 g/dL 7.6 7.4 7.6  Total Bilirubin 0.3 - 1.2 mg/dL 0.6 0.6 0.6  Alkaline Phos 38 - 126 U/L  134(H) 148(H) 146(H)  AST 15 - 41 U/L 53(H) 59(H) 55(H)  ALT 0 - 44 U/L 34 39 42    Lab Results  Component Value Date   WBC 2.9 (L) 05/23/2021   HGB 12.9 05/23/2021   HCT 37.9 05/23/2021   MCV 95.0 05/23/2021   PLT 100 (L) 05/23/2021   NEUTROABS 1.6 (L) 05/23/2021    ASSESSMENT & PLAN:  Malignant neoplasm of overlapping sites of right breast in female, estrogen receptor positive ([place]) 06/21/2020:Patient palpated a right breast mass x 1 wk. Mammogram showed multiple confluent masses in the right breast at the 7 o'clock position measuring 2.3cm and at the 9 o'clock position measuring 2.2cm with one right axillary lymph node with cortical thickening. Biopsy showed invasive mammary carcinoma at both positions and in the axilla, grade 2-3, HER-2 equivocal by IHC (2+), positive by FISH (ratio 3.34), ER+ >95%, PR+ 25%, Ki67 60%.   Treatment plan: 1. Neoadjuvant chemotherapy with [place] Perjeta 6 cycles completed 10/26/2020 followed by Herceptin Perjeta versus [name] for 1 year 2.  Right mastectomy [name]): IDC,  grade 3, spanning 4.0cm fibrotic area, clear margins, 1/4 right axillary lymph nodes positive for carcinoma, 0.5cm. Left lumpectomy: no evidence of malignancy 3. Followed by adjuvant radiation therapy completed 02/17/2021 4.  Followed by adjuvant antiestrogen therapy started 12/29/2020 5.  Followed by neratinib ---------------------------------------------------------------------------------------------------------------------------------- Current Treatment: [name] with letrozole Kadcyla toxicities: Leukopenia: We reduced the dosage of Kadcyla. Mild thrombocytopenia: Stable Elevation of AST and ALT:   stable  4.  Peripheral neuropathy: Concerning.  We will reduce the dosage of Kadcyla.  The neuropathy appears to be relatively stable.       Letrozole toxicities: Tolerating it extremely well. We discussed the role of neratinib and she is willing to take it once  a Kadcyla is completed.  (Probably starting June/July 2023) Vertigo: Brain MRI: Negative, patient is seeing vestibular specialist tomorrow  Surveillance: Mammograms to be scheduled after December 15 at [facility], breast MRIs to be done in June July 2023 Bone density: November 2022: T score -2.4: Recommended Prolia along with calcium and vitamin D. Return to clinic every 3 weeks for Kadcyla    No orders of the defined types were placed in this encounter.  The patient has a good understanding of the overall plan. she agrees with it. she will call with any problems that may develop before the next visit here.  Total time spent: 45 mins including face to face time and time spent for planning, charting and coordination of care  Rulon Eisenmenger, MD, MPH 05/23/2021  I, Thana Ates, am acting as scribe for Dr. Nicholas Lose.  I have reviewed the above documentation for accuracy and completeness, and I agree with the above.

## 2021-05-23 ENCOUNTER — Inpatient Hospital Stay (HOSPITAL_BASED_OUTPATIENT_CLINIC_OR_DEPARTMENT_OTHER): Payer: BC Managed Care – PPO | Admitting: Hematology and Oncology

## 2021-05-23 ENCOUNTER — Inpatient Hospital Stay: Payer: BC Managed Care – PPO

## 2021-05-23 ENCOUNTER — Other Ambulatory Visit: Payer: Self-pay | Admitting: *Deleted

## 2021-05-23 ENCOUNTER — Other Ambulatory Visit: Payer: Self-pay

## 2021-05-23 VITALS — BP 103/49 | HR 60 | Temp 98.4°F | Resp 18

## 2021-05-23 DIAGNOSIS — Z5112 Encounter for antineoplastic immunotherapy: Secondary | ICD-10-CM | POA: Diagnosis not present

## 2021-05-23 DIAGNOSIS — Z170 Estrogen receptor positive status [ER+]: Secondary | ICD-10-CM | POA: Diagnosis not present

## 2021-05-23 DIAGNOSIS — C50811 Malignant neoplasm of overlapping sites of right female breast: Secondary | ICD-10-CM

## 2021-05-23 DIAGNOSIS — G629 Polyneuropathy, unspecified: Secondary | ICD-10-CM | POA: Diagnosis not present

## 2021-05-23 LAB — CBC WITH DIFFERENTIAL/PLATELET
Abs Immature Granulocytes: 0.01 10*3/uL (ref 0.00–0.07)
Basophils Absolute: 0.1 10*3/uL (ref 0.0–0.1)
Basophils Relative: 2 %
Eosinophils Absolute: 0.2 10*3/uL (ref 0.0–0.5)
Eosinophils Relative: 7 %
HCT: 37.9 % (ref 36.0–46.0)
Hemoglobin: 12.9 g/dL (ref 12.0–15.0)
Immature Granulocytes: 0 %
Lymphocytes Relative: 27 %
Lymphs Abs: 0.8 10*3/uL (ref 0.7–4.0)
MCH: 32.3 pg (ref 26.0–34.0)
MCHC: 34 g/dL (ref 30.0–36.0)
MCV: 95 fL (ref 80.0–100.0)
Monocytes Absolute: 0.3 10*3/uL (ref 0.1–1.0)
Monocytes Relative: 11 %
Neutro Abs: 1.6 10*3/uL — ABNORMAL LOW (ref 1.7–7.7)
Neutrophils Relative %: 53 %
Platelets: 100 10*3/uL — ABNORMAL LOW (ref 150–400)
RBC: 3.99 MIL/uL (ref 3.87–5.11)
RDW: 12.6 % (ref 11.5–15.5)
WBC: 2.9 10*3/uL — ABNORMAL LOW (ref 4.0–10.5)
nRBC: 0 % (ref 0.0–0.2)

## 2021-05-23 LAB — CMP (CANCER CENTER ONLY)
ALT: 34 U/L (ref 0–44)
AST: 56 U/L — ABNORMAL HIGH (ref 15–41)
Albumin: 4 g/dL (ref 3.5–5.0)
Alkaline Phosphatase: 136 U/L — ABNORMAL HIGH (ref 38–126)
Anion gap: 6 (ref 5–15)
BUN: 13 mg/dL (ref 6–20)
CO2: 26 mmol/L (ref 22–32)
Calcium: 9.5 mg/dL (ref 8.9–10.3)
Chloride: 105 mmol/L (ref 98–111)
Creatinine: 0.73 mg/dL (ref 0.44–1.00)
GFR, Estimated: >60 mL/min (ref >60)
Glucose, Bld: 102 mg/dL — ABNORMAL HIGH (ref 70–99)
Potassium: 4.1 mmol/L (ref 3.5–5.1)
Sodium: 137 mmol/L (ref 135–145)
Total Bilirubin: 0.7 mg/dL (ref 0.3–1.2)
Total Protein: 7.8 g/dL (ref 6.5–8.1)

## 2021-05-23 MED ORDER — ACETAMINOPHEN 325 MG PO TABS
650.0000 mg | ORAL_TABLET | Freq: Once | ORAL | Status: AC
  Administered 2021-05-23: 650 mg via ORAL

## 2021-05-23 MED ORDER — SODIUM CHLORIDE 0.9% FLUSH: 10.0000 mL | Freq: Once | INTRAVENOUS | Status: DC

## 2021-05-23 MED ORDER — SODIUM CHLORIDE 0.9 % IV SOLN
Freq: Once | INTRAVENOUS | Status: AC
Start: 2021-05-23 — End: 2021-05-23

## 2021-05-23 MED ORDER — DIPHENHYDRAMINE HCL 25 MG PO CAPS
50.0000 mg | ORAL_CAPSULE | Freq: Once | ORAL | Status: AC
  Administered 2021-05-23: 50 mg via ORAL

## 2021-05-23 MED ORDER — SODIUM CHLORIDE 0.9 % IV SOLN
2.4000 mg/kg | Freq: Once | INTRAVENOUS | Status: AC
  Administered 2021-05-23: 160 mg via INTRAVENOUS
  Filled 2021-05-23: qty 8

## 2021-05-23 NOTE — Patient Instructions (Signed)
[facility]  Discharge Instructions: °Thank you for choosing [facility] to provide your oncology and hematology care.  ° °If you have a lab appointment with the Cancer Center, please go directly to the Cancer Center and check in at the registration area. °  °Wear comfortable clothing and clothing appropriate for easy access to any Portacath or PICC line.  ° °We strive to give you quality time with your provider. You may need to reschedule your appointment if you arrive late (15 or more minutes).  Arriving late affects you and other patients whose appointments are after yours.  Also, if you miss three or more appointments without notifying the office, you may be dismissed from the clinic at the provider’s discretion.    °  °For prescription refill requests, have your pharmacy contact our office and allow 72 hours for refills to be completed.   ° °Today you received the following chemotherapy and/or immunotherapy agents Kadcyla    °  °To help prevent nausea and vomiting after your treatment, we encourage you to take your nausea medication as directed. ° °BELOW ARE SYMPTOMS THAT SHOULD BE REPORTED IMMEDIATELY: °*FEVER GREATER THAN 100.4 F (38 °C) OR HIGHER °*CHILLS OR SWEATING °*NAUSEA AND VOMITING THAT IS NOT CONTROLLED WITH YOUR NAUSEA MEDICATION °*UNUSUAL SHORTNESS OF BREATH °*UNUSUAL BRUISING OR BLEEDING °*URINARY PROBLEMS (pain or burning when urinating, or frequent urination) °*BOWEL PROBLEMS (unusual diarrhea, constipation, pain near the anus) °TENDERNESS IN MOUTH AND THROAT WITH OR WITHOUT PRESENCE OF ULCERS (sore throat, sores in mouth, or a toothache) °UNUSUAL RASH, SWELLING OR PAIN  °UNUSUAL VAGINAL DISCHARGE OR ITCHING  ° °Items with * indicate a potential emergency and should be followed up as soon as possible or go to the Emergency Department if any problems should occur. ° °Please show the CHEMOTHERAPY ALERT CARD or IMMUNOTHERAPY ALERT CARD at check-in to the  Emergency Department and triage nurse. ° °Should you have questions after your visit or need to cancel or reschedule your appointment, please contact [facility]  Dept: [phone]  and follow the prompts.  Office hours are 8:00 a.m. to 4:30 p.m. Monday - Friday. Please note that voicemails left after 4:00 p.m. may not be returned until the following business day.  We are closed weekends and major holidays. You have access to a nurse at all times for urgent questions. Please call the main number to the clinic Dept: [phone] and follow the prompts. ° ° °For any non-urgent questions, you may also contact your provider using MyChart. We now offer e-Visits for anyone 18 and older to request care online for non-urgent symptoms. For details visit [email or web address]. °  °Also download the MyChart app! Go to the app store, search "MyChart", open the app, select [place] [facility], and log in with your MyChart username and password. ° °Due to Covid, a mask is required upon entering the hospital/clinic. If you do not have a mask, one will be given to you upon arrival. For doctor visits, patients may have 1 support person aged 18 or older with them. For treatment visits, patients cannot have anyone with them due to current Covid guidelines and our immunocompromised population.  ° °

## 2021-05-24 ENCOUNTER — Ambulatory Visit
Payer: BC Managed Care – PPO | Attending: Hematology and Oncology | Admitting: Rehabilitative and Restorative Service Providers"

## 2021-05-24 ENCOUNTER — Ambulatory Visit: Payer: BC Managed Care – PPO

## 2021-05-24 ENCOUNTER — Ambulatory Visit: Payer: BC Managed Care – PPO | Admitting: Hematology and Oncology

## 2021-05-24 ENCOUNTER — Encounter: Payer: Self-pay | Admitting: *Deleted

## 2021-05-24 ENCOUNTER — Other Ambulatory Visit: Payer: BC Managed Care – PPO

## 2021-05-24 ENCOUNTER — Encounter: Payer: Self-pay | Admitting: Rehabilitative and Restorative Service Providers"

## 2021-05-24 DIAGNOSIS — Z170 Estrogen receptor positive status [ER+]: Secondary | ICD-10-CM | POA: Diagnosis not present

## 2021-05-24 DIAGNOSIS — R2689 Other abnormalities of gait and mobility: Secondary | ICD-10-CM | POA: Insufficient documentation

## 2021-05-24 DIAGNOSIS — C50811 Malignant neoplasm of overlapping sites of right female breast: Secondary | ICD-10-CM | POA: Insufficient documentation

## 2021-05-24 DIAGNOSIS — R42 Dizziness and giddiness: Secondary | ICD-10-CM | POA: Diagnosis not present

## 2021-05-24 DIAGNOSIS — R293 Abnormal posture: Secondary | ICD-10-CM | POA: Insufficient documentation

## 2021-05-24 NOTE — Therapy (Signed)
[facility] @ [place], [place], [address] Phone: [phone]   Fax:  [phone]  Physical Therapy Evaluation  Patient Details  Name: Teresa Sheppard MRN: [id number] Date of Birth: [birth year] Referring Provider (PT): Dr Lindi Adie   Encounter Date: 05/24/2021   PT End of Session - 05/24/21 1321     Visit Number 1    Date for PT Re-Evaluation 06/30/21    Authorization Type BC/BS    PT Start Time 4496    PT Stop Time 1315    PT Time Calculation (min) 40 min    Activity Tolerance Patient tolerated treatment well    Behavior During Therapy [facility] for tasks assessed/performed             Past Medical History:  Diagnosis Date   Anxiety    Breast cancer in female [facility])    Right   Family history of breast cancer 11/07/2020   Family history of pancreatic cancer 11/07/2020   History of kidney stones 2014-2015   Hyperthyroidism    No longer an issue   PONV (postoperative nausea and vomiting)    Thyromegaly     Past Surgical History:  Procedure Laterality Date   ABDOMINAL WALL DEFECT REPAIR     following hernia repair in 2010   [place]     2008  while pregnant with last child. Scar tissue from appendectomy wrapped around bowel and was snipped to free bowel.    CESAREAN SECTION     DILATION AND CURETTAGE OF UTERUS     2000 and 2014   HERNIA REPAIR     MASTECTOMY W/ SENTINEL NODE BIOPSY Right 11/28/2020   Procedure: RIGHT MASTECTOMY WITH RIGHT AXILLARY SENTINEL LYMPH NODE BIOPSY;  Surgeon: [name], MD;  Location: [place];  Service: General;  Laterality: Right;   PORT-A-CATH REMOVAL Left 01/30/2021   Procedure: PORT REMOVAL;  Surgeon: [name], MD;  Location: [place];  Service: General;  Laterality: Left;  LOCAL   PORTACATH PLACEMENT Left 07/12/2020   Procedure: INSERTION PORT-A-CATH WITH ULTRASOUND GUIDANCE;  Surgeon: [name], MD;  Location: [place];  Service:  General;  Laterality: Left;   RADIOACTIVE SEED GUIDED AXILLARY SENTINEL LYMPH NODE Right 11/28/2020   Procedure: RADIOACTIVE SEED GUIDED RIGHT AXILLARY AXILLARY SENTINEL LYMPH NODE EXCISION;  Surgeon: [name], MD;  Location: [place];  Service: General;  Laterality: Right;   RADIOACTIVE SEED GUIDED EXCISIONAL BREAST BIOPSY Left 11/28/2020   Procedure: RADIOACTIVE SEED GUIDED LEFT EXCISIONAL BREAST BIOPSY;  Surgeon: [name], MD;  Location: [place];  Service: General;  Laterality: Left;   ROOT CANAL     2019   TONSILLECTOMY  1969   URETERAL EXPLORATION     dilataion for congenitally small ureter    There were no vitals filed for this visit.    Subjective Assessment - 05/24/21 1237     Subjective Pt reports that she noticed that she was having dizziness in summer time.  She reports that when she is walking the dog or walking with her husband who walks fast that she notices some dizziness with stopping suddenly or with quick head movements. Reports that the dizziness lasts for under 5 seconds.  Pt reports that she sometimes has the dizziness when turning over in bed.    Pertinent History R breast cancer, left outer quadrant, triple positive, Ki 67 - 60%, grade 2 -3 IDC, neoadjuvant chemo and still is getting immuno  therapy,  right mastectomy on 11/28/2020 with 4 nodes removed on the right and she had an excision of breast tissue on the left that is benign  and just started radiation    Limitations Walking    Diagnostic tests MRI of brain to rule out brain mets    Patient Stated Goals To not have to worry about walking by herself and be able to work out as she was previously without dizziness impeding her.    Currently in Pain? No/denies                [facility] PT Assessment - 05/24/21 0001       Assessment   Medical Diagnosis Dizzy and R breast cancer    Referring Provider (PT) Dr Molly Maduro Dominance Right    Next MD Visit 06/12/21      Balance Screen   Has the  patient fallen in the past 6 months Yes    How many times? 1    Has the patient had a decrease in activity level because of a fear of falling?  Yes    Is the patient reluctant to leave their home because of a fear of falling?  No      Home Social worker Private residence    Living Arrangements Spouse/significant other;Children    Available Help at Discharge Family    Type of [place] to enter    Entrance Stairs-Number of Steps 4    Entrance Stairs-Rails Can reach both    [place] Two level;Able to live on main level with bedroom/bathroom      Prior Function   Level of Independence Independent    Vocation On disability    Vocation Requirements Out of work currently, hopes to return to work    Leisure run/walk 2-3x/wk, light weight lifting, squats, swimming      Cognition   Overall Cognitive Status Within Functional Limits for tasks assessed      Posture/Postural Control   Posture/Postural Control Postural limitations    Postural Limitations Rounded Shoulders;Forward head      Standardized Balance Assessment   Standardized Balance Assessment Berg Balance Test      Berg Balance Test   Sit to Stand Able to stand without using hands and stabilize independently    Standing Unsupported Able to stand safely 2 minutes    Sitting with Back Unsupported but Feet Supported on Floor or Stool Able to sit safely and securely 2 minutes    Stand to Sit Sits safely with minimal use of hands    Transfers Able to transfer safely, minor use of hands    Standing Unsupported with Eyes Closed Able to stand 10 seconds safely    Standing Unsupported with Feet Together Able to place feet together independently and stand 1 minute safely    From Standing, Reach Forward with Outstretched Arm Can reach confidently >25 cm (10")    From Standing Position, Pick up Object from Floor Able to pick up shoe safely and easily    From Standing Position, Turn to Look  Behind Over each Shoulder Looks behind from both sides and weight shifts well    Turn 360 Degrees Able to turn 360 degrees safely in 4 seconds or less    Standing Unsupported, Alternately Place Feet on Step/Stool Able to stand independently and safely and complete 8 steps in 20 seconds    Standing Unsupported, One Foot in  Front Able to plae foot ahead of the other independently and hold 30 seconds    Standing on One Leg Able to lift leg independently and hold 5-10 seconds    Total Score 54                    Vestibular Assessment - 05/24/21 0001       Symptom Behavior   Subjective history of current problem History of 15 years ago having a similar episode of dizzy/lightheadedness and states a provider performed a maneuver and it got better.    Type of Dizziness  Lightheadedness    Frequency of Dizziness Daily, primarily with fast movements    Duration of Dizziness 5 seconds    Aggravating Factors Turning head quickly      Oculomotor Exam   Ocular ROM WNL    Spontaneous Absent    Gaze-induced  Absent    Smooth Pursuits Intact    Saccades Intact      Positional Testing   Dix-Hallpike Dix-Hallpike Right;Dix-Hallpike Left      Dix-Hallpike Right   Dix-Hallpike Right Duration less than a minute    Dix-Hallpike Right Symptoms --   nystagmus noted     Dix-Hallpike Left   Dix-Hallpike Left Duration negative                Objective measurements completed on examination: See above findings.        Vestibular Treatment/Exercise - 05/24/21 0001       Vestibular Treatment/Exercise   Vestibular Treatment Provided Canalith Repositioning    Canalith Repositioning Epley Manuever Right       EPLEY MANUEVER RIGHT   Number of Reps  2    Overall Response Symptoms Resolved                      PT Short Term Goals - 05/24/21 1330       PT SHORT TERM GOAL #1   Title Pt will be independent with initial HEP.    Time 2    Period Weeks    Status New                PT Long Term Goals - 05/24/21 1330       PT LONG TERM GOAL #1   Title Pt will be independent with advanced HEP.    Time 6    Period Weeks    Status New      PT LONG TERM GOAL #2   Title Pt will be able to return to her floor/mat exercise program without dizziness.    Time 6    Period Weeks    Status New      PT LONG TERM GOAL #3   Title Pt will be able to walk her dog without increased dizziness.    Time 6    Period Weeks    Status New                    Plan - 05/24/21 1323     Clinical Impression Statement Pt is a 60 y.o. female referred to outpatient PT from Dr Nicholas Lose secondary to recent onset dizziness.  Pt had a brain MRI to rule out brain mets, and was unremarkable. 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 It is HER2+, ER+, PR+. Pt presents with R upper trap tightness and dizziness.  R side Marye Round was positive and proceeded with  treatment of Epley Maneuver.  Pt reporting that she feels better with movements following Epley Maneuver.  Educated pt on levator and upper trap stretch for HEP. Pt with BERG of 54/56 with most difficulty noted with tandem stance and single leg stance. Pt would benefit from skilled PT to address her vestibular impairments to allow her to return to her active lifestyle without dizziness.    Personal Factors and Comorbidities Comorbidity 2    Comorbidities mastectomy and radition/chemo    Stability/Clinical Decision Making Stable/Uncomplicated    Clinical Decision Making Low    Rehab Potential Good    PT Frequency 1x / week    PT Duration 6 weeks    PT Treatment/Interventions ADLs/Self Care Home Management;Canalith Repostioning;Cryotherapy;Moist Heat;Gait training;Stair training;Functional mobility training;Therapeutic activities;Therapeutic exercise;Balance training;Neuromuscular re-education;Patient/family education;Manual techniques;Vestibular    PT Next Visit Plan Retest  Dix Hallpike, balance    Consulted and Agree with Plan of Care Patient             Patient will benefit from skilled therapeutic intervention in order to improve the following deficits and impairments:  Postural dysfunction, Decreased knowledge of precautions, Decreased strength, Increased fascial restricitons, Decreased activity tolerance, Dizziness  Visit Diagnosis: Dizzy - Plan: PT plan of care cert/re-cert  Balance problem - Plan: PT plan of care cert/re-cert  Abnormal posture - Plan: PT plan of care cert/re-cert     Problem List Patient Active Problem List   Diagnosis Date Noted   S/P mastectomy, left 11/28/2020   Genetic testing 11/15/2020   Family history of breast cancer 11/07/2020   Family history of pancreatic cancer 11/07/2020   Port-A-Cath in place 10/26/2020   Dysphagia 10/26/2020   Acute urinary tract infection 10/14/2020   Malignant tumor of breast ([place]) 06/22/2020   Malignant neoplasm of overlapping sites of right breast in female, estrogen receptor positive ([place]) 06/21/2020   Hyperthyroidism 03/28/2016    Juel Burrow, PT, DPT 05/24/2021, 1:33 PM  [facility] @ [place], [place], [address] Phone: [phone]   Fax:  [phone]  Name: Teresa Sheppard MRN: [id number] Date of Birth: 08/12/1960

## 2021-05-26 ENCOUNTER — Encounter: Payer: Self-pay | Admitting: Hematology and Oncology

## 2021-05-29 ENCOUNTER — Other Ambulatory Visit: Payer: Self-pay | Admitting: *Deleted

## 2021-05-29 DIAGNOSIS — Z170 Estrogen receptor positive status [ER+]: Secondary | ICD-10-CM

## 2021-05-29 NOTE — Progress Notes (Signed)
Per MD request RN placed referral to Dr. Michail Sermon psychology. Pt notified and appreciative of referral.

## 2021-05-31 ENCOUNTER — Ambulatory Visit
Payer: BC Managed Care – PPO | Attending: Hematology and Oncology | Admitting: Rehabilitative and Restorative Service Providers"

## 2021-05-31 ENCOUNTER — Other Ambulatory Visit: Payer: Self-pay

## 2021-05-31 ENCOUNTER — Encounter: Payer: Self-pay | Admitting: Rehabilitative and Restorative Service Providers"

## 2021-05-31 DIAGNOSIS — R293 Abnormal posture: Secondary | ICD-10-CM | POA: Insufficient documentation

## 2021-05-31 DIAGNOSIS — R42 Dizziness and giddiness: Secondary | ICD-10-CM | POA: Diagnosis not present

## 2021-05-31 DIAGNOSIS — R2689 Other abnormalities of gait and mobility: Secondary | ICD-10-CM | POA: Diagnosis not present

## 2021-05-31 NOTE — Therapy (Signed)
[facility] @ [place], [place], [address] Phone: [phone]   Fax:  [phone]  Physical Therapy Treatment  Patient Details  Name: Teresa Sheppard MRN: [id number] Date of Birth: August 30, 1960 Referring Provider (PT): Dr Lindi Adie   Encounter Date: 05/31/2021   PT End of Session - 05/31/21 1132     Visit Number 2    Date for PT Re-Evaluation 06/30/21    Authorization Type BC/BS    PT Start Time 1102    PT Stop Time 1140    PT Time Calculation (min) 38 min    Activity Tolerance Patient tolerated treatment well    Behavior During Therapy [facility] for tasks assessed/performed             Past Medical History:  Diagnosis Date   Anxiety    Breast cancer in female [facility])    Right   Family history of breast cancer 11/07/2020   Family history of pancreatic cancer 11/07/2020   History of kidney stones 2014-2015   Hyperthyroidism    No longer an issue   PONV (postoperative nausea and vomiting)    Thyromegaly     Past Surgical History:  Procedure Laterality Date   ABDOMINAL WALL DEFECT REPAIR     following hernia repair in 2010   [place]     2008  while pregnant with last child. Scar tissue from appendectomy wrapped around bowel and was snipped to free bowel.    CESAREAN SECTION     DILATION AND CURETTAGE OF UTERUS     2000 and 2014   HERNIA REPAIR     MASTECTOMY W/ SENTINEL NODE BIOPSY Right 11/28/2020   Procedure: RIGHT MASTECTOMY WITH RIGHT AXILLARY SENTINEL LYMPH NODE BIOPSY;  Surgeon: [name], MD;  Location: [place];  Service: General;  Laterality: Right;   PORT-A-CATH REMOVAL Left 01/30/2021   Procedure: PORT REMOVAL;  Surgeon: [name], MD;  Location: [place];  Service: General;  Laterality: Left;  LOCAL   PORTACATH PLACEMENT Left 07/12/2020   Procedure: INSERTION PORT-A-CATH WITH ULTRASOUND GUIDANCE;  Surgeon: [name], MD;  Location: [place];  Service:  General;  Laterality: Left;   RADIOACTIVE SEED GUIDED AXILLARY SENTINEL LYMPH NODE Right 11/28/2020   Procedure: RADIOACTIVE SEED GUIDED RIGHT AXILLARY AXILLARY SENTINEL LYMPH NODE EXCISION;  Surgeon: [name], MD;  Location: [place];  Service: General;  Laterality: Right;   RADIOACTIVE SEED GUIDED EXCISIONAL BREAST BIOPSY Left 11/28/2020   Procedure: RADIOACTIVE SEED GUIDED LEFT EXCISIONAL BREAST BIOPSY;  Surgeon: [name], MD;  Location: [place];  Service: General;  Laterality: Left;   ROOT CANAL     2019   TONSILLECTOMY  1969   URETERAL EXPLORATION     dilataion for congenitally small ureter    There were no vitals filed for this visit.   Subjective Assessment - 05/31/21 1109     Subjective Pt reports that she is feeling better and less dizzy, but still had some dizziness following last treatment.    Pertinent History R breast cancer, left outer quadrant, triple positive, Ki 67 - 60%, grade 2 -3 IDC, neoadjuvant chemo and still is getting immuno therapy,  right mastectomy on 11/28/2020 with 4 nodes removed on the right and she had an excision of breast tissue on the left that is benign  and just started radiation    Diagnostic tests MRI of brain to rule out brain mets    Patient Stated  Goals To not have to worry about walking by herself and be able to work out as she was previously without dizziness impeding her.    Currently in Pain? No/denies                     Vestibular Assessment - 05/31/21 0001       Positional Testing   Dix-Hallpike Dix-Hallpike Right;Dix-Hallpike Left      Dix-Hallpike Right   Dix-Hallpike Right Duration negative      Dix-Hallpike Left   Dix-Hallpike Left Duration less than a minute                      [facility] Adult PT Treatment/Exercise - 05/31/21 0001       High Level Balance   High Level Balance Comments Tandem gait             Vestibular Treatment/Exercise - 05/31/21 0001       Vestibular  Treatment/Exercise   Vestibular Treatment Provided Canalith Repositioning    Canalith Repositioning Epley Manuever Left    Habituation Exercises Brandt Daroff;Seated Horizontal Head Turns;Seated Vertical Head Turns;Standing Horizontal Head Turns;Standing Vertical Head Turns       EPLEY MANUEVER LEFT   Number of Reps  2    Overall Response  Symptoms Resolved      Nestor Lewandowsky   Number of Reps  2      Seated Horizontal Head Turns   Number of Reps  1    Symptom Description  VOR 1x1      Seated Vertical Head Turns   Number of Reps  1    Symptom Description  VOR 1x1      Standing Horizontal Head Turns   Number of Reps  4    Symptom Description  ambulation x 3 laps of 10'      Standing Vertical Head Turns   Number of Reps  4    Symptom Description  Ambulation x3 laps of 10'                    PT Education - 05/31/21 1127     Education Details Pt provided with HEP.    Person(s) Educated Patient    Methods Explanation;Demonstration;Handout    Comprehension Verbalized understanding;Returned demonstration              PT Short Term Goals - 05/31/21 1146       PT SHORT TERM GOAL #1   Title Pt will be independent with initial HEP.    Status On-going               PT Long Term Goals - 05/24/21 1330       PT LONG TERM GOAL #1   Title Pt will be independent with advanced HEP.    Time 6    Period Weeks    Status New      PT LONG TERM GOAL #2   Title Pt will be able to return to her floor/mat exercise program without dizziness.    Time 6    Period Weeks    Status New      PT LONG TERM GOAL #3   Title Pt will be able to walk her dog without increased dizziness.    Time 6    Period Weeks    Status New                   Plan -  05/31/21 1143     Clinical Impression Statement Ms [place]rie[place] with positive L sided Marye Round today and proceeded with treatment with Epley Maneuver x2. Pt educated on [employer] exercises for HEP. Pt  progressed with balance exercises well, but did have dififculty with tandem gait.    PT Treatment/Interventions ADLs/Self Care Home Management;Canalith Repostioning;Cryotherapy;Moist Heat;Gait training;Stair training;Functional mobility training;Therapeutic activities;Therapeutic exercise;Balance training;Neuromuscular re-education;Patient/family education;Manual techniques;Vestibular    PT Next Visit Plan Retest Marye Round, balance    PT Home Exercise Plan Access Code [email or web address]    Consulted and Agree with Plan of Care Patient             Patient will benefit from skilled therapeutic intervention in order to improve the following deficits and impairments:  Postural dysfunction, Decreased knowledge of precautions, Decreased strength, Increased fascial restricitons, Decreased activity tolerance, Dizziness  Visit Diagnosis: Dizzy  Balance problem  Abnormal posture     Problem List Patient Active Problem List   Diagnosis Date Noted   S/P mastectomy, left 11/28/2020   Genetic testing 11/15/2020   Family history of breast cancer 11/07/2020   Family history of pancreatic cancer 11/07/2020   Port-A-Cath in place 10/26/2020   Dysphagia 10/26/2020   Acute urinary tract infection 10/14/2020   Malignant tumor of breast ([place]) 06/22/2020   Malignant neoplasm of overlapping sites of right breast in female, estrogen receptor positive ([place]) 06/21/2020   Hyperthyroidism 03/28/2016    Juel Burrow, PT, DPT 05/31/2021, 11:54 AM  [facility] @ [place], [place], [address] Phone: [phone]   Fax:  [phone]  Name: AFREEN SIEBELS MRN: [id number] Date of Birth: 1961-06-18

## 2021-05-31 NOTE — Patient Instructions (Signed)
Access Code: [email or web address] URL: [email or web address] Date: 05/31/2021 Prepared by: Juel Burrow  Exercises Brandt-Daroff Vestibular Exercise - 1-2 x daily - 7 x weekly - 1 sets - 5 reps Seated Gaze Stabilization with Head Rotation - 1-2 x daily - 7 x weekly - 1 sets - 10 reps Seated Gaze Stabilization with Head Nod - 1-2 x daily - 7 x weekly - 1 sets - 10 reps Seated Upper Trapezius Stretch - 1-2 x daily - 7 x weekly - 1 sets - 2 reps - 20 sec hold Seated Levator Scapulae Stretch - 1-2 x daily - 7 x weekly - 1 sets - 2 reps - 20 sec hold

## 2021-06-01 ENCOUNTER — Encounter: Payer: Self-pay | Admitting: Hematology and Oncology

## 2021-06-01 ENCOUNTER — Telehealth: Payer: Self-pay

## 2021-06-01 ENCOUNTER — Other Ambulatory Visit: Payer: Self-pay | Admitting: *Deleted

## 2021-06-01 NOTE — Telephone Encounter (Signed)
Returned pt call, scheduled pt for virtual visit per her request 12/16

## 2021-06-05 ENCOUNTER — Ambulatory Visit: Payer: BC Managed Care – PPO | Admitting: Hematology and Oncology

## 2021-06-05 ENCOUNTER — Ambulatory Visit (INDEPENDENT_AMBULATORY_CARE_PROVIDER_SITE_OTHER): Payer: BC Managed Care – PPO | Admitting: Psychologist

## 2021-06-05 DIAGNOSIS — F4521 Hypochondriasis: Secondary | ICD-10-CM | POA: Diagnosis not present

## 2021-06-05 NOTE — Progress Notes (Addendum)
[place] Counselor Initial Adult Exam  Name: Teresa Sheppard Date: 06/05/2021 MRN: [id number] DOB: 15-May-1961 PCP: Wenda Low, MD  Time spent: 9:02 am to 9:50 am; Total Time: 48 minutes  This session was held via video webex teletherapy due to the coronavirus risk at this time. The patient consented to video teletherapy and was located at her home during this session. She is aware it is the responsibility of the patient to secure confidentiality on her end of the session. The provider was in a private home office for the duration of this session. Limits of confidentiality were discussed with the patient.   Guardian/Payee:  NA    Paperwork requested: No   Reason for Visit /Presenting Problem: Patient is experiencing anxiety over a reoccurrence of breast cancer.   Mental Status Exam: Appearance:   Well Groomed     Behavior:  Appropriate  Motor:  Normal  Speech/Language:   Normal Rate  Affect:  Appropriate  Mood:  anxious  Thought process:  normal  Thought content:    WNL  Sensory/Perceptual disturbances:    WNL  Orientation:  oriented to person, place, time/date, situation, and day of week  Attention:  Good  Concentration:  Good  Memory:  WNL  Fund of knowledge:   Good  Insight:    Fair  Judgment:   Fair  Impulse Control:  Good     Reported Symptoms:  The patient endorsed experiencing the following: a high level of anxiety regarding health, excessive health related behaviors, and preoccupation with a reoccurrence of the cancer. She denied suicidal and homicidal ideation.   Risk Assessment: Danger to Self:  No Self-injurious Behavior: No Danger to Others: No Duty to Warn:no Physical Aggression / Violence:No  Access to Firearms a concern: No  Gang Involvement:No  Patient / guardian was educated about steps to take if suicide or homicide risk level increases between visits: n/a While future psychiatric events cannot be accurately predicted, the  patient does not currently require acute inpatient psychiatric care and does not currently meet [facility] involuntary commitment criteria.  Substance Abuse History: Current substance abuse: No     Past Psychiatric History:   No previous psychological problems have been observed Outpatient Providers:NA History of Psych Hospitalization: No  Psychological Testing:  NA    Abuse History:  Victim of: No.,  NA    Report needed: No. Victim of Neglect:No. Perpetrator of  NA   Witness / Exposure to Domestic Violence: No   Protective Services Involvement: No  Witness to [employer] Violence:  No   Family History:  Family History  Problem Relation Age of Onset   Breast cancer Mother 66       metastatic   Breast cancer Sister 60   Breast cancer Maternal Aunt        dx 62s   Pancreatic cancer Father 10   Pancreatic cancer Paternal Grandfather        dx 12s   Breast cancer Sister 78   Leukemia Nephew 31   Cancer Maternal Aunt        unknown type; ?bone; dx late 28s    Living situation: the patient lives with their spouse  Sexual Orientation: Straight  Relationship Status: married  Name of spouse / other: Patient's husband's name is Gwyndolyn Saxon.  If a parent, number of children / ages:Patient has four children who range in age from 2-28.   Support Systems: spouse friends family  Financial Stress:  No   Income/Employment/Disability: Patient's  husband currently works. Patient is currently not employed.   Military Service: No   Educational History: Education:  Patient has two masters degree  Religion/Sprituality/World View: Patient stated that she identifies as Methodist and her husband as Nurse, learning disability. Per the patient, it is important to her, but not the most important part of her  identity.   Any cultural differences that may affect / interfere with treatment:  Denied  Recreation/Hobbies: Patient did not share hobbies at this time.   Stressors: Health problems   Other:  Patient is concerned about a reoccurrence of her breast cancer.     Strengths: Supportive Relationships, Family, and Friends  Barriers:  Denied   Legal History: Pending legal issue / charges:  NA. History of legal issue / charges:  NA  Medical History/Surgical History: reviewed Past Medical History:  Diagnosis Date   Anxiety    Breast cancer in female [facility])    Right   Family history of breast cancer 11/07/2020   Family history of pancreatic cancer 11/07/2020   History of kidney stones 2014-2015   Hyperthyroidism    No longer an issue   PONV (postoperative nausea and vomiting)    Thyromegaly     Past Surgical History:  Procedure Laterality Date   ABDOMINAL WALL DEFECT REPAIR     following hernia repair in 2010   [place]     2008  while pregnant with last child. Scar tissue from appendectomy wrapped around bowel and was snipped to free bowel.    CESAREAN SECTION     DILATION AND CURETTAGE OF UTERUS     2000 and 2014   HERNIA REPAIR     MASTECTOMY W/ SENTINEL NODE BIOPSY Right 11/28/2020   Procedure: RIGHT MASTECTOMY WITH RIGHT AXILLARY SENTINEL LYMPH NODE BIOPSY;  Surgeon: [name], MD;  Location: [place];  Service: General;  Laterality: Right;   PORT-A-CATH REMOVAL Left 01/30/2021   Procedure: PORT REMOVAL;  Surgeon: [name], MD;  Location: [place];  Service: General;  Laterality: Left;  LOCAL   PORTACATH PLACEMENT Left 07/12/2020   Procedure: INSERTION PORT-A-CATH WITH ULTRASOUND GUIDANCE;  Surgeon: [name], MD;  Location: [place];  Service: General;  Laterality: Left;   RADIOACTIVE SEED GUIDED AXILLARY SENTINEL LYMPH NODE Right 11/28/2020   Procedure: RADIOACTIVE SEED GUIDED RIGHT AXILLARY AXILLARY SENTINEL LYMPH NODE EXCISION;  Surgeon: [name], MD;  Location: [place];  Service: General;  Laterality: Right;   RADIOACTIVE SEED GUIDED EXCISIONAL BREAST BIOPSY Left 11/28/2020   Procedure: RADIOACTIVE SEED GUIDED LEFT  EXCISIONAL BREAST BIOPSY;  Surgeon: [name], MD;  Location: [place];  Service: General;  Laterality: Left;   ROOT CANAL     2019   TONSILLECTOMY  1969   URETERAL EXPLORATION     dilataion for congenitally small ureter    Medications: Current Outpatient Medications  Medication Sig Dispense Refill   acetaminophen (TYLENOL) 325 MG tablet Take 325 mg by mouth every 6 (six) hours as needed (headaches / pain).     Calcium-Vitamin D-Vitamin K ([employer] CALCIUM SOFT CHEWS) 650-12.5-40 MG-MCG-MCG CHEW Chew 1 tablet by mouth 2 (two) times daily.     cholecalciferol (VITAMIN D3) 25 MCG (1000 UNIT) tablet Take 1,000 Units by mouth daily.     Cranberry-Vitamin C 250-60 MG CAPS Take 1 tablet by mouth in the morning and at bedtime.     hydrocortisone cream 1 % Apply 1 application topically 4 (four) times daily as needed for itching.  letrozole (FEMARA) 2.5 MG tablet Take 1 tablet (2.5 mg total) by mouth daily. 90 tablet 3   loratadine (CLARITIN) 10 MG tablet Take 10 mg by mouth daily.     Multiple Vitamins-Minerals (HAIR/SKIN/NAILS/BIOTIN PO) Take 1 capsule by mouth daily.     Polyethyl Glycol-Propyl Glycol (SYSTANE OP) Place 1 drop into both eyes daily as needed (dry eyes).     Wound Cleansers (RADIAPLEX EX) Apply 1 application topically daily as needed (radiation).     No current facility-administered medications for this visit.   Facility-Administered Medications Ordered in Other Visits  Medication Dose Route Frequency Provider Last Rate Last Admin   sodium chloride flush (NS) 0.9 % injection 10 mL  10 mL Intracatheter Once Nicholas Lose, MD       sodium chloride flush (NS) 0.9 % injection 10 mL  10 mL Intracatheter Once Nicholas Lose, MD        Allergies  Allergen Reactions   Shellfish-Derived Products Anaphylaxis    Per patient whenever she consumes a lot gives tightness of throat   Penicillins Rash    Diagnoses:  F45.21Illness anxiety disorder  Plan of Care: The patient is a 60  year old woman who was referred due to experiencing anxiety and concern over a reoccurrence of breast cancer. The patient stated that she lives at home with her husband, and youngest children. The patient meets criteria for a diagnosis of F45.21 illness anxiety disorder based off of the following: a high level of anxiety regarding health, excessive health related behaviors, and preoccupation with a reoccurrence of the cancer. She denied suicidal and homicidal ideation.   The patient stated that she wanted coping skills and a place to process her emotions.  This psychologist makes the recommendation that the patient participate in at least monthly therapy. If possibly, the patient may benefit from participating in bi-weekly therapy to assist in experiencing a decrease in distress from symptoms.    Conception Chancy, PsyD

## 2021-06-05 NOTE — Plan of Care (Signed)
Goals Work through the grieving process and face reality of own death Accept emotional support from others around them Live life to the fullest, event though time may be limited Become as knowledgeable about the medical condition  Reduce fear, anxiety about the health condition  Accept the illness Accept the role of psychological and behavioral factors  Stabilize anxiety level wile increasing ability to function Learn and implement coping skills that result in a reduction of anxiety  Alleviate depressive symptoms Recognize, accept, and cope with depressive feelings Develop healthy thinking patterns Develop healthy interpersonal relationships  Objectives Identify feelings associated with the illness Family members share with each other feelings Identify the losses or limitations that have been experienced Verbalize acceptance of the reality of the medical condition Commit to learning and implement a proactive approach to managing personal stresses Verbalize an understanding of the medical condition Work with therapist to develop a plan for coping with stress Learn and implement skills for managing stress Engage in social, productive activities that are possible Engage in faith based activities implement positive imagery Identify coping skills and sources of emotional support Patient's partner and family members verbalize their fears regarding severity of health condition Identify sources of emotional distress  Learning and implement calming skills to reduce overall anxiety Learn and implement problem solving strategies Identify and engage in pleasant activities Learning and implement personal and interpersonal skills to reduce anxiety and improve interpersonal relationships Learn to accept limitations in life and commit to tolerating, rather than avoiding, unpleasant emotions while accomplishing meaningful goals Identify major life conflicts from the past and present that form the  basis for present anxiety Learn and implement behavioral strategies Verbalize an understanding and resolution of current interpersonal problems Learn and implement problem solving and decision making skills Learn and implement conflict resolution skills to resolve interpersonal problems Verbalize an understanding of healthy and unhealthy emotions verbalize insight into how past relationships may be influence current experiences with depression Use mindfulness and acceptance strategies and increase value based behavior  Increase hopeful statements about the future.   Interventions Teach about stress and ways to handle stress Assist the patient in developing a coping action plan for stressors Conduct skills based training for coping strategies Train problem focused skills Sort out what activities the individual can do Encourage patient to rely upon his/her spiritual faith Teach the patient to use guided imagery Probe and evaluate family's ability to provide emotional support Allow family to share their fears Assist the patient in identifying, sorting through, and verbalizing the various feelings generated by his/her medical condition Meet with family members  Ask patient list out limitations  Use stress inoculation training  Use Acceptance and Commitment Therapy to help client accept uncomfortable realities in order to accomplish value-consistent goals Reinforce the client's insight into the role of his/her past emotional pain and present anxiety  Discuss examples demonstrating that unrealistic worry overestimates the probability of threats and und3restmiates patient's ability  Assist the patient in analyzing his or her worries Help patient understand that avoidance is reinforcing  Behavioral activation help the client explore the relationship, nature of the dispute,  Help the client develop new interpersonal skills and relationships Conduct Problem so living therapy Teach conflict  resolution skills Use a process-experiential approach Conduct TLDP Conduct ACT   Problem: Anxiety Disorder CCP Problem  1 Alleviate distress from illness anxiety Goal: " Reduce fear, anxiety about the health condition  Outcome: Not Progressing Goal: " Stabilize anxiety level wile increasing ability to function Outcome:  Not Progressing Goal: " Learn and implement coping skills that result in a reduction of anxiety  Outcome: Not Progressing Goal: " Identify feelings associated with the illness Outcome: Not Progressing Goal: " Learn to accept limitations in life and commit to tolerating, rather than avoiding, unpleasant emotions while accomplishing meaningful goals Outcome: Not Progressing Goal: STG: Patient will participate in at least 80% of scheduled individual psychotherapy sessions Outcome: Not Progressing Goal: STG: Patient will complete at least 80% of assigned homework Outcome: Not Progressing

## 2021-06-06 ENCOUNTER — Telehealth: Payer: Self-pay

## 2021-06-06 NOTE — Telephone Encounter (Signed)
Pt sent mychart message concerning infusion appt. Pt understands she has virtual visit with MD 12/16. Pt's infusion 12/19 will be rescheduled for later in January per her request, as she wants to feel well for the holidays. OK per MD

## 2021-06-08 ENCOUNTER — Encounter: Payer: Self-pay | Admitting: Hematology and Oncology

## 2021-06-08 DIAGNOSIS — R922 Inconclusive mammogram: Secondary | ICD-10-CM | POA: Diagnosis not present

## 2021-06-08 DIAGNOSIS — Z853 Personal history of malignant neoplasm of breast: Secondary | ICD-10-CM | POA: Diagnosis not present

## 2021-06-08 NOTE — Assessment & Plan Note (Signed)
06/21/2020:Patient palpated a right breast mass x 1 wk. Mammogram showed multiple confluent masses in the right breast at the 7 o'clock position measuring 2.3cm and at the 9 o'clock position measuring 2.2cm with one right axillary lymph node with cortical thickening. Biopsy showed invasive mammary carcinoma at both positions and in the axilla, grade 2-3, HER-2 equivocal by IHC (2+), positive by FISH (ratio 3.34), ER+ >95%, PR+ 25%, Ki67 60%.  Treatment plan: 1. Neoadjuvant chemotherapy with [place] Perjeta 6 cyclescompleted 5/4/2022followed by HerceptinPerjeta versus Kadcylamaintenance for 1 year 2.Right mastectomy Teresa Sheppard): IDC, grade 3, spanning 4.0cm fibrotic area, clear margins, 1/4 right axillary lymph nodes positive for carcinoma, 0.5cm. Left lumpectomy: no evidence of malignancy 3. Followed by adjuvant radiation therapycompleted 02/17/2021 4.Followed by adjuvant antiestrogen therapystarted 12/29/2020 5.Followed by neratinib ---------------------------------------------------------------------------------------------------------------------------------- Current Treatment: Kadcyla Maintenancewith letrozole Kadcyla on hold until the holidays are done.  Letrozole toxicities: Tolerating it extremely well. We discussed the role of neratinib and she is willing to take it once a Kadcyla is completed. (Probably starting June/July 2023) Vertigo: Brain MRI: Negative, patient is seeing vestibular specialist tomorrow  Surveillance: Mammograms to be scheduled after December 15 at [facility], breast MRIs to be done in June July 2023 Bone density: November 2022: T score -2.4: Recommended Prolia along with calcium and vitamin D.

## 2021-06-08 NOTE — Progress Notes (Signed)
HEMATOLOGY-ONCOLOGY MYCHART VIDEO VISIT PROGRESS NOTE  I connected with Teresa Sheppard on 06/09/2021 at  3:45 PM EST by MyChart video conference and verified that I am speaking with the correct person using two identifiers.  I discussed the limitations, risks, security and privacy concerns of performing an evaluation and management service by MyChart and the availability of in person appointments.  I also discussed with the patient that there may be a patient responsible charge related to this service. The patient expressed understanding and agreed to proceed.  Patient's Location: Home Physician Location: Clinic  CHIEF COMPLIANT: Virtual visit to discuss her treatment plan  INTERVAL HISTORY: Teresa Sheppard is a 60 y.o. female with above-mentioned history of right breast cancer who had a right mastectomy and completed neoadjuvant chemotherapy and with [place], and is currently on [name]. She presents via MyChart today for follow-up.  We have decided to postpone Kadcyla until she enjoys her Christmas holiday and comes back on December 30.  She had multiple questions related to Prolia and wanted to have a virtual meeting to discuss these questions.  She is feeling significantly better in terms of her energy levels as well as peripheral neuropathy.  Oncology History  Malignant neoplasm of overlapping sites of right breast in female, estrogen receptor positive ([place])  06/21/2020 Initial Diagnosis   Patient palpated a right breast mass x 1 wk. Mammogram showed multiple confluent masses in the right breast at the 7 o'clock position measuring 2.3cm and at the 9 o'clock position measuring 2.2cm with one right axillary lymph node with cortical thickening. Biopsy showed invasive mammary carcinoma at both positions and in the axilla, grade 2-3, HER-2 equivocal by IHC (2+), positive by FISH (ratio 3.34), ER+ >95%, PR+ 25%, Ki67 60%.   06/29/2020 Cancer Staging   Staging form: Breast, AJCC 8th  Edition - Clinical stage from 06/29/2020: Stage IB (cT2, cN1(f), cM0, G3, ER+, PR+, HER2+) - Signed by Nicholas Lose, MD on 06/29/2020    07/13/2020 - 10/26/2020 Chemotherapy      Patient is on Antibody Plan: BREAST TRASTUZUMAB + PERTUZUMAB Q21D     10/28/2020 Breast MRI   Complete imaging response   11/14/2020 Genetic Testing   Negative hereditary cancer genetic testing: no pathogenic variants detected in [facility] BRCAPlus Panel and [facility] CancerNext-Expanded +RNAinsight Panel.  The report dates are Nov 14, 2020 and December 05, 2020, respectively.    The BRCAplus panel offered by [employer] and includes sequencing and deletion/duplication analysis for the following 8 genes: ATM, BRCA1, BRCA2, CDH1, CHEK2, PALB2, PTEN, and TP53.  The CancerNext-Expanded gene panel offered by [facility] and includes sequencing, rearrangement, and RNA analysis for the following 77 genes: AIP, ALK, APC, ATM, AXIN2, BAP1, BARD1, BLM, BMPR1A, BRCA1, BRCA2, BRIP1, CDC73, CDH1, CDK4, CDKN1B, CDKN2A, CHEK2, CTNNA1, DICER1, FANCC, FH, FLCN, GALNT12, KIF1B, LZTR1, MAX, MEN1, MET, MLH1, MSH2, MSH3, MSH6, MUTYH, NBN, NF1, NF2, NTHL1, PALB2, PHOX2B, PMS2, POT1, PRKAR1A, PTCH1, PTEN, RAD51C, RAD51D, RB1, RECQL, RET, SDHA, SDHAF2, SDHB, SDHC, SDHD, SMAD4, SMARCA4, SMARCB1, SMARCE1, STK11, SUFU, TMEM127, TP53, TSC1, TSC2, VHL and XRCC2 (sequencing and deletion/duplication); EGFR, EGLN1, HOXB13, KIT, MITF, PDGFRA, POLD1, and POLE (sequencing only); EPCAM and GREM1 (deletion/duplication only).    11/28/2020 Surgery   Right mastectomy [name]): IDC, grade 3, spanning 4.0cm fibrotic area, clear margins, 1/4 right axillary lymph nodes positive for carcinoma, 0.5cm.   Left lumpectomy: no evidence of malignancy   12/07/2020 -  Chemotherapy   Patient is on Treatment Plan : BREAST ADO-Trastuzumab  Emtansine (Kadcyla) q21d     01/04/2021 - 02/17/2021 Radiation Therapy   Adjuvant radiation     Observations/Objective:  There were no vitals  filed for this visit. There is no height or weight on file to calculate BMI.  I have reviewed the data as listed CMP Latest Ref Rng & Units 05/23/2021 05/02/2021 04/10/2021  Glucose 70 - 99 mg/dL 102(H) 103(H) 98  BUN 6 - 20 mg/dL 13 10 11   Creatinine 0.44 - 1.00 mg/dL 0.73 0.77 0.79  Sodium 135 - 145 mmol/L 137 141 139  Potassium 3.5 - 5.1 mmol/L 4.1 3.7 3.9  Chloride 98 - 111 mmol/L 105 107 106  CO2 22 - 32 mmol/L 26 24 23   Calcium 8.9 - 10.3 mg/dL 9.5 9.4 9.6  Total Protein 6.5 - 8.1 g/dL 7.8 7.6 7.4  Total Bilirubin 0.3 - 1.2 mg/dL 0.7 0.6 0.6  Alkaline Phos 38 - 126 U/L 136(H) 134(H) 148(H)  AST 15 - 41 U/L 56(H) 53(H) 59(H)  ALT 0 - 44 U/L 34 34 39    Lab Results  Component Value Date   WBC 2.9 (L) 05/23/2021   HGB 12.9 05/23/2021   HCT 37.9 05/23/2021   MCV 95.0 05/23/2021   PLT 100 (L) 05/23/2021   NEUTROABS 1.6 (L) 05/23/2021      Assessment Plan:  Malignant neoplasm of overlapping sites of right breast in female, estrogen receptor positive ([place]) 06/21/2020:Patient palpated a right breast mass x 1 wk. Mammogram showed multiple confluent masses in the right breast at the 7 o'clock position measuring 2.3cm and at the 9 o'clock position measuring 2.2cm with one right axillary lymph node with cortical thickening. Biopsy showed invasive mammary carcinoma at both positions and in the axilla, grade 2-3, HER-2 equivocal by IHC (2+), positive by FISH (ratio 3.34), ER+ >95%, PR+ 25%, Ki67 60%.   Treatment plan: 1. Neoadjuvant chemotherapy with TCH Perjeta 6 cycles completed 10/26/2020 followed by Herceptin Perjeta versus [name] for 1 year 2.  Right mastectomy [name]): IDC, grade 3, spanning 4.0cm fibrotic area, clear margins, 1/4 right axillary lymph nodes positive for carcinoma, 0.5cm. Left lumpectomy: no evidence of malignancy 3. Followed by adjuvant radiation therapy completed 02/17/2021 4.  Followed by adjuvant antiestrogen therapy started 12/29/2020 5.  Followed  by neratinib ---------------------------------------------------------------------------------------------------------------------------------- Current Treatment: [name] with letrozole Kadcyla on hold until the holidays are done.  Letrozole toxicities: Tolerating it extremely well.    Surveillance: Mamm[facility] June 08, 2021 at [facility]: Benign Bone density: November 2022: T score -2.4: Recommended Prolia along with calcium and vitamin D.  Prolia counseling: We discussed extensively about Prolia risks and benefits.  She understands the risks and benefits and will likely start Prolia after she completes her dental work-up in February or March 2023.  She plans to go to [place] in February.  She wonders if her immune system would be able to tolerate the travel.  I recommended that she go ahead and make arrangements to proceed with the travel plan.    I discussed the assessment and treatment plan with the patient. The patient was provided an opportunity to ask questions and all were answered. The patient agreed with the plan and demonstrated an understanding of the instructions. The patient was advised to call back or seek an in-person evaluation if the symptoms worsen or if the condition fails to improve as anticipated.   Total time spent: 30 minutes including face-to-face MyChart video visit time and time spent for planning, charting and coordination of care  Rulon Eisenmenger, MD 06/09/2021  I, Thana Ates am acting as scribe for Nicholas Lose, MD.  I have reviewed the above documentation for accuracy and completeness, and I agree with the above.

## 2021-06-09 ENCOUNTER — Telehealth: Payer: Self-pay | Admitting: Hematology and Oncology

## 2021-06-09 ENCOUNTER — Ambulatory Visit: Payer: BC Managed Care – PPO | Admitting: Rehabilitative and Restorative Service Providers"

## 2021-06-09 ENCOUNTER — Other Ambulatory Visit: Payer: Self-pay

## 2021-06-09 ENCOUNTER — Inpatient Hospital Stay: Payer: BC Managed Care – PPO | Attending: Hematology and Oncology | Admitting: Hematology and Oncology

## 2021-06-09 ENCOUNTER — Encounter: Payer: Self-pay | Admitting: Rehabilitative and Restorative Service Providers"

## 2021-06-09 DIAGNOSIS — R42 Dizziness and giddiness: Secondary | ICD-10-CM

## 2021-06-09 DIAGNOSIS — C50811 Malignant neoplasm of overlapping sites of right female breast: Secondary | ICD-10-CM

## 2021-06-09 DIAGNOSIS — R2689 Other abnormalities of gait and mobility: Secondary | ICD-10-CM | POA: Diagnosis not present

## 2021-06-09 DIAGNOSIS — R293 Abnormal posture: Secondary | ICD-10-CM | POA: Diagnosis not present

## 2021-06-09 DIAGNOSIS — Z170 Estrogen receptor positive status [ER+]: Secondary | ICD-10-CM | POA: Diagnosis not present

## 2021-06-09 DIAGNOSIS — Z79811 Long term (current) use of aromatase inhibitors: Secondary | ICD-10-CM | POA: Insufficient documentation

## 2021-06-09 NOTE — Therapy (Signed)
[facility] @ [address] [place], [place], [address] Phone: [phone]   Fax:  [phone]  Physical Therapy Treatment  Patient Details  Name: ASHELEY HELLBERG MRN: [id number] Date of Birth: 04-19-1961 Referring Provider (PT): Dr Lindi Adie   Encounter Date: 06/09/2021   PT End of Session - 06/09/21 1020     Visit Number 3    Number of Visits 6    Date for PT Re-Evaluation 06/30/21    Authorization Type BC/BS    PT Start Time 1015    PT Stop Time 1055    PT Time Calculation (min) 40 min    Activity Tolerance Patient tolerated treatment well    Behavior During Therapy [facility] for tasks assessed/performed             Past Medical History:  Diagnosis Date   Anxiety    Breast cancer in female [facility])    Right   Family history of breast cancer 11/07/2020   Family history of pancreatic cancer 11/07/2020   History of kidney stones 2014-2015   Hyperthyroidism    No longer an issue   PONV (postoperative nausea and vomiting)    Thyromegaly     Past Surgical History:  Procedure Laterality Date   ABDOMINAL WALL DEFECT REPAIR     following hernia repair in 2010   [place]     2008  while pregnant with last child. Scar tissue from appendectomy wrapped around bowel and was snipped to free bowel.    CESAREAN SECTION     DILATION AND CURETTAGE OF UTERUS     2000 and 2014   HERNIA REPAIR     MASTECTOMY W/ SENTINEL NODE BIOPSY Right 11/28/2020   Procedure: RIGHT MASTECTOMY WITH RIGHT AXILLARY SENTINEL LYMPH NODE BIOPSY;  Surgeon: [name], MD;  Location: [place];  Service: General;  Laterality: Right;   PORT-A-CATH REMOVAL Left 01/30/2021   Procedure: PORT REMOVAL;  Surgeon: [name], MD;  Location: [place];  Service: General;  Laterality: Left;  LOCAL   PORTACATH PLACEMENT Left 07/12/2020   Procedure: INSERTION PORT-A-CATH WITH ULTRASOUND GUIDANCE;  Surgeon: [name], MD;   Location: [facility];  Service: General;  Laterality: Left;   RADIOACTIVE SEED GUIDED AXILLARY SENTINEL LYMPH NODE Right 11/28/2020   Procedure: RADIOACTIVE SEED GUIDED RIGHT AXILLARY AXILLARY SENTINEL LYMPH NODE EXCISION;  Surgeon: [name], MD;  Location: [place];  Service: General;  Laterality: Right;   RADIOACTIVE SEED GUIDED EXCISIONAL BREAST BIOPSY Left 11/28/2020   Procedure: RADIOACTIVE SEED GUIDED LEFT EXCISIONAL BREAST BIOPSY;  Surgeon: [name], MD;  Location: [place];  Service: General;  Laterality: Left;   ROOT CANAL     2019   TONSILLECTOMY  1969   URETERAL EXPLORATION     dilataion for congenitally small ureter    There were no vitals filed for this visit.   Subjective Assessment - 06/09/21 1018     Subjective Pt reports that since approx Sunday, she has not had any dizziness and she was able to walk her dog and not feel unsteady.    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    Currently in Pain? No/denies                               [facility] Adult PT Treatment/Exercise - 06/09/21 0001       Exercises   Exercises Knee/Hip      Knee/Hip Exercises: Standing   Forward Step Up Both;1 set;10 reps    Forward Step Up Limitations onto bosu    Functional Squat Limitations Monster walks fwd and side stepping with yellow loop around ankles 10' x3 laps each    Other Standing Knee Exercises Modified dead lift 5# B x10      Knee/Hip Exercises: Seated   Sit to Sand 20 reps   2x10 holding 10# kettlebell            Vestibular  Treatment/Exercise - 06/09/21 0001       Vestibular Treatment/Exercise   Vestibular Treatment Provided Canalith Repositioning    Canalith Repositioning Epley Manuever Left    Habituation Exercises Brandt Daroff       EPLEY MANUEVER LEFT   Number of Reps  2    Overall Response  Symptoms Resolved                      PT Short Term Goals - 06/09/21 1213       PT SHORT TERM GOAL #1   Title Pt will be independent with initial HEP.    Status Achieved               PT Long Term Goals - 06/09/21 1213       PT LONG TERM GOAL #1   Title Pt will be independent with advanced HEP.    Status On-going      PT LONG TERM GOAL #2   Title Pt will be able to return to her floor/mat exercise program without dizziness.    Status On-going      PT LONG TERM GOAL #3   Title Pt will be able to walk her dog without increased dizziness.    Status Partially Met                   Plan - 06/09/21 1059     Clinical Impression Statement Ms Schlink continues with improvements with her vestibular.  Her [employer] on L side was only positive for nystagmus for approx 10 sec on first trial and 5 seconds on 2nd round of Epley maneuver. She was able to progress with more advanced balance and conditioning exercises today.  Updated HEP to include higher level tasks to progress vestibular system.    PT Treatment/Interventions ADLs/Self Care Home Management;Canalith Repostioning;Cryotherapy;Moist Heat;Gait training;Stair training;Functional mobility training;Therapeutic activities;Therapeutic exercise;Balance training;Neuromuscular re-education;Patient/family education;Manual techniques;Vestibular    PT Next Visit Plan Retest Marye Round, balance    PT Home Exercise Plan Access Code [email or web address]    Consulted and Agree with Plan of Care Patient             Patient will benefit from skilled therapeutic intervention in order to improve the following deficits and impairments:  Postural  dysfunction, Decreased knowledge of precautions, Decreased strength, Increased fascial restricitons, Decreased activity tolerance, Dizziness  Visit Diagnosis: Dizzy  Balance problem     Problem List Patient Active Problem List   Diagnosis  Date Noted   S/P mastectomy, left 11/28/2020   Genetic testing 11/15/2020   Family history of breast cancer 11/07/2020   Family history of pancreatic cancer 11/07/2020   Port-A-Cath in place 10/26/2020   Dysphagia 10/26/2020   Acute urinary tract infection 10/14/2020   Malignant tumor of breast ([place]) 06/22/2020   Malignant neoplasm of overlapping sites of right breast in female, estrogen receptor positive ([place]) 06/21/2020   Hyperthyroidism 03/28/2016    Juel Burrow, PT, DPT 06/09/2021, 12:16 PM  [facility] @ [address] [place], [place], [address] Phone: [phone]   Fax:  [phone]  Name: JUNEAU DOUGHMAN MRN: [id number] Date of Birth: 11-10-1960

## 2021-06-09 NOTE — Telephone Encounter (Signed)
Please see notes from the virtual visit

## 2021-06-12 ENCOUNTER — Ambulatory Visit: Payer: BC Managed Care – PPO | Admitting: Hematology and Oncology

## 2021-06-12 ENCOUNTER — Inpatient Hospital Stay: Payer: BC Managed Care – PPO

## 2021-06-19 DIAGNOSIS — W19XXXA Unspecified fall, initial encounter: Secondary | ICD-10-CM | POA: Diagnosis not present

## 2021-06-19 DIAGNOSIS — S52352A Displaced comminuted fracture of shaft of radius, left arm, initial encounter for closed fracture: Secondary | ICD-10-CM | POA: Diagnosis not present

## 2021-06-19 DIAGNOSIS — M25532 Pain in left wrist: Secondary | ICD-10-CM | POA: Diagnosis not present

## 2021-06-20 ENCOUNTER — Encounter: Payer: Self-pay | Admitting: Hematology and Oncology

## 2021-06-21 ENCOUNTER — Telehealth: Payer: Self-pay | Admitting: *Deleted

## 2021-06-21 DIAGNOSIS — E0590 Thyrotoxicosis, unspecified without thyrotoxic crisis or storm: Secondary | ICD-10-CM | POA: Diagnosis not present

## 2021-06-21 DIAGNOSIS — E042 Nontoxic multinodular goiter: Secondary | ICD-10-CM | POA: Diagnosis not present

## 2021-06-21 DIAGNOSIS — R1310 Dysphagia, unspecified: Secondary | ICD-10-CM | POA: Diagnosis not present

## 2021-06-21 DIAGNOSIS — M810 Age-related osteoporosis without current pathological fracture: Secondary | ICD-10-CM | POA: Diagnosis not present

## 2021-06-21 NOTE — Telephone Encounter (Addendum)
This RN spoke with the patient - who sent a My Chart message yesterday per new issue of a left broken wrist.  She states she was at an [employer] in [place]- and tripped over a door stop.  She states she is seeing Dr Burney Gauze tomorrow and likely (  per her brother in law who is an Doctor, general practice ) will need surgery.  She stated concern over her platelet level and current use of naprosyn and tramadol as well as if the surgeon will need labs.  Of note pt states she no longer has a port a cath.  She is scheduled for Kadcyla on 12/30 - but her cast on her usable arm goes from her hand to "right at the bend in my elbow " - making an IV attempt difficult.  Plan per call is pt will see orthopedic MD - and once surgery is recommended - will adjust schedule as well as if lab is needed for surgery can do in this office per fingerstick for CBC or foot stick if needed.  Teresa Sheppard is in agreement to plan

## 2021-06-22 ENCOUNTER — Telehealth: Payer: Self-pay | Admitting: *Deleted

## 2021-06-22 ENCOUNTER — Encounter: Payer: Self-pay | Admitting: Hematology and Oncology

## 2021-06-22 ENCOUNTER — Encounter: Payer: BC Managed Care – PPO | Admitting: Rehabilitative and Restorative Service Providers"

## 2021-06-22 DIAGNOSIS — S52572A Other intraarticular fracture of lower end of left radius, initial encounter for closed fracture: Secondary | ICD-10-CM | POA: Diagnosis not present

## 2021-06-22 NOTE — Telephone Encounter (Signed)
This RN spoke with pt today post her visit with orthopedic surgeon Dr Burney Gauze for broken wrist ad need for surgery.  Please see prior phone notes per above if needed-   Teresa Sheppard states she has been scheduled for surgery tomorrow 06/23/2021 on her L wrist.  She states Dr Burney Gauze does not need new labs for surgery.  Appt for maintenance kadcyla infusion scheduled 06/23/2021 will be canceled per above.  Pt is scheduled to be seen by Dr Lindi Adie on 07/04/2020.  Per discussion- Teresa Sheppard stated concern that her oncology md's are aware of above- with understanding that Dr Lindi Adie is out until tomorrow.  Per pt's request this note will be forwarded to Dr [name], Bary Castilla and per pt's request Dr Burr Medico in the absence of Dr Lindi Adie.

## 2021-06-23 ENCOUNTER — Inpatient Hospital Stay: Payer: BC Managed Care – PPO

## 2021-06-23 DIAGNOSIS — X58XXXA Exposure to other specified factors, initial encounter: Secondary | ICD-10-CM | POA: Diagnosis not present

## 2021-06-23 DIAGNOSIS — Y999 Unspecified external cause status: Secondary | ICD-10-CM | POA: Diagnosis not present

## 2021-06-23 DIAGNOSIS — S52572A Other intraarticular fracture of lower end of left radius, initial encounter for closed fracture: Secondary | ICD-10-CM | POA: Diagnosis not present

## 2021-06-23 DIAGNOSIS — G8918 Other acute postprocedural pain: Secondary | ICD-10-CM | POA: Diagnosis not present

## 2021-06-27 DIAGNOSIS — S52572A Other intraarticular fracture of lower end of left radius, initial encounter for closed fracture: Secondary | ICD-10-CM | POA: Diagnosis not present

## 2021-06-28 ENCOUNTER — Encounter: Payer: Self-pay | Admitting: Hematology and Oncology

## 2021-06-29 ENCOUNTER — Other Ambulatory Visit (HOSPITAL_COMMUNITY)
Admission: RE | Admit: 2021-06-29 | Discharge: 2021-06-29 | Disposition: A | Discharge status: Home / Self Care | Payer: BC Managed Care – PPO | Source: Ambulatory Visit | Attending: Surgery | Admitting: Surgery

## 2021-06-29 ENCOUNTER — Ambulatory Visit
Admission: RE | Admit: 2021-06-29 | Discharge: 2021-06-29 | Disposition: A | Discharge status: Home / Self Care | Payer: BC Managed Care – PPO | Source: Ambulatory Visit | Attending: Surgery | Admitting: Surgery

## 2021-06-29 DIAGNOSIS — E041 Nontoxic single thyroid nodule: Secondary | ICD-10-CM | POA: Insufficient documentation

## 2021-06-30 ENCOUNTER — Ambulatory Visit: Payer: BC Managed Care – PPO | Attending: General Surgery | Admitting: Rehabilitative and Restorative Service Providers"

## 2021-06-30 ENCOUNTER — Encounter: Payer: Self-pay | Admitting: Rehabilitative and Restorative Service Providers"

## 2021-06-30 ENCOUNTER — Other Ambulatory Visit: Payer: Self-pay

## 2021-06-30 DIAGNOSIS — R42 Dizziness and giddiness: Secondary | ICD-10-CM | POA: Diagnosis not present

## 2021-06-30 DIAGNOSIS — Z483 Aftercare following surgery for neoplasm: Secondary | ICD-10-CM | POA: Diagnosis not present

## 2021-06-30 DIAGNOSIS — R2689 Other abnormalities of gait and mobility: Secondary | ICD-10-CM | POA: Diagnosis not present

## 2021-06-30 NOTE — Therapy (Signed)
[facility] @ [address] [place], [place], [address] Phone: [phone]   Fax:  [phone]  Physical Therapy Treatment and Discharge Summary  Patient Details  Name: Teresa Sheppard MRN: [id number] Date of Birth: 11-09-1960 Referring Provider (PT): Dr Lindi Adie   Encounter Date: 06/30/2021   PT End of Session - 06/30/21 1027     Visit Number 4    Date for PT Re-Evaluation 06/30/21    Authorization Type BC/BS    PT Start Time 1017    PT Stop Time 1055    PT Time Calculation (min) 38 min    Activity Tolerance Patient tolerated treatment well    Behavior During Therapy [facility] for tasks assessed/performed             Past Medical History:  Diagnosis Date   Anxiety    Breast cancer in female [facility])    Right   Family history of breast cancer 11/07/2020   Family history of pancreatic cancer 11/07/2020   History of kidney stones 2014-2015   Hyperthyroidism    No longer an issue   PONV (postoperative nausea and vomiting)    Thyromegaly     Past Surgical History:  Procedure Laterality Date   ABDOMINAL WALL DEFECT REPAIR     following hernia repair in 2010   [place]     2008  while pregnant with last child. Scar tissue from appendectomy wrapped around bowel and was snipped to free bowel.    CESAREAN SECTION     DILATION AND CURETTAGE OF UTERUS     2000 and 2014   HERNIA REPAIR     MASTECTOMY W/ SENTINEL NODE BIOPSY Right 11/28/2020   Procedure: RIGHT MASTECTOMY WITH RIGHT AXILLARY SENTINEL LYMPH NODE BIOPSY;  Surgeon: [name], MD;  Location: [place];  Service: General;  Laterality: Right;   PORT-A-CATH REMOVAL Left 01/30/2021   Procedure: PORT REMOVAL;  Surgeon: [name], MD;  Location: [place];  Service: General;  Laterality: Left;  LOCAL   PORTACATH PLACEMENT Left 07/12/2020   Procedure: INSERTION PORT-A-CATH WITH ULTRASOUND GUIDANCE;  Surgeon: [name], MD;  Location:  [place];  Service: General;  Laterality: Left;   RADIOACTIVE SEED GUIDED AXILLARY SENTINEL LYMPH NODE Right 11/28/2020   Procedure: RADIOACTIVE SEED GUIDED RIGHT AXILLARY AXILLARY SENTINEL LYMPH NODE EXCISION;  Surgeon: [name], MD;  Location: [place];  Service: General;  Laterality: Right;   RADIOACTIVE SEED GUIDED EXCISIONAL BREAST BIOPSY Left 11/28/2020   Procedure: RADIOACTIVE SEED GUIDED LEFT EXCISIONAL BREAST BIOPSY;  Surgeon: [name], MD;  Location: [place];  Service: General;  Laterality: Left;   ROOT CANAL     2019   TONSILLECTOMY  1969   URETERAL EXPLORATION     dilataion for congenitally small ureter    There were no vitals filed for this visit.   Subjective Assessment - 06/30/21 1020     Subjective Pt reports falling on 06/20/21 by tripping over a footrest after passing through doorway and sustained a L wrist Fx.  Pt underwent ORIF to L wrist Fx on 06/23/21.  Pt returns to MD on 07/04/21 for suture removal.    Patient Stated Goals To not have to worry about walking by herself and be able to work out as she was previously without dizziness impeding her.    Currently in Pain? Yes    Pain Score 3    increases to 5-6/10 at night.   Pain  Location Wrist    Pain Orientation Left    Pain Descriptors / Indicators Constant;Throbbing;Sharp    Pain Type Acute pain;Surgical pain                     Vestibular Assessment - 06/30/21 0001       Positional Testing   Dix-Hallpike Dix-Hallpike Right;Dix-Hallpike Left      Dix-Hallpike Right   Dix-Hallpike Right Duration negative      Dix-Hallpike Left   Dix-Hallpike Left Duration negative                      [facility] Adult PT Treatment/Exercise - 06/30/21 0001       High Level Balance   High Level Balance Comments single leg stance      Knee/Hip Exercises: Aerobic   Nustep L6 x6 min.  PT present to discuss progress.      Knee/Hip Exercises: Standing   Functional Squat Limitations Monster  walks fwd and side stepping with yellow loop around ankles 10' x3 laps each             Vestibular Treatment/Exercise - 06/30/21 0001       Standing Horizontal Head Turns   Symptom Description  ambulation x 3 laps of 10'      Standing Vertical Head Turns   Symptom Description  Ambulation x3 laps of 10'                      PT Short Term Goals - 06/09/21 1213       PT SHORT TERM GOAL #1   Title Pt will be independent with initial HEP.    Status Achieved               PT Long Term Goals - 06/30/21 1043       PT LONG TERM GOAL #1   Title Pt will be independent with advanced HEP.    Status Achieved      PT LONG TERM GOAL #2   Title Pt will be able to return to her floor/mat exercise program without dizziness.    Status Achieved   denies dizziness with dancing exercise program.     PT LONG TERM GOAL #3   Title Pt will be able to walk her dog without increased dizziness.    Status Achieved                   Plan - 06/30/21 1154     Clinical Impression Statement Teresa Sheppard has made excellent improvements with her vestibular impairments.  While pt did experience a fall after Christmas, it was due to not seeing an obstacle in her path and tripping over it, not due to dizziness.  Pt denies any dizziness.  Pt without nystagmus noted with Marye Round on either side and without loss of balance with dynamic gait activities with head turns.  Reviewed HEP for patient, adding single leg stance.  Pt to follow up with surgeon regarding any needs for her L wrist s/p ORIF on 06/23/2021.  Pt discharged from outpatient PT at this time secondary to vestibular impairments.  PT episode to remain open, as pt continues to have SOZO checks to monitor risk of lymphedema.    Personal Factors and Comorbidities Comorbidity 2    Comorbidities mastectomy and radition/chemo    PT Treatment/Interventions ADLs/Self Care Home Management;Canalith Repostioning;Cryotherapy;Moist  Heat;Gait training;Stair training;Functional mobility training;Therapeutic activities;Therapeutic exercise;Balance training;Neuromuscular re-education;Patient/family education;Manual techniques;Vestibular  PT Next Visit Plan outpatient vestibular PT discharged on 06/30/2021    Consulted and Agree with Plan of Care Patient             Patient will benefit from skilled therapeutic intervention in order to improve the following deficits and impairments:  Postural dysfunction, Decreased knowledge of precautions, Decreased strength, Increased fascial restricitons, Decreased activity tolerance, Dizziness  Visit Diagnosis: Dizzy  Balance problem     Problem List Patient Active Problem List   Diagnosis Date Noted   S/P mastectomy, left 11/28/2020   Genetic testing 11/15/2020   Family history of breast cancer 11/07/2020   Family history of pancreatic cancer 11/07/2020   Port-A-Cath in place 10/26/2020   Dysphagia 10/26/2020   Acute urinary tract infection 10/14/2020   Malignant tumor of breast ([place]) 06/22/2020   Malignant neoplasm of overlapping sites of right breast in female, estrogen receptor positive ([place]) 06/21/2020   Hyperthyroidism 03/28/2016   PHYSICAL THERAPY DISCHARGE SUMMARY  Patient agrees to discharge. Patient goals were met. Patient is being discharged due to meeting the stated rehab goals.   Juel Burrow, PT, DPT 06/30/2021, 12:03 PM  [facility] @ [address] [place], [place], [address] Phone: [phone]   Fax:  [phone]  Name: Teresa Sheppard MRN: [id number] Date of Birth: 09-09-1960

## 2021-07-01 LAB — CYTOLOGY - NON PAP

## 2021-07-01 NOTE — Progress Notes (Signed)
FNA biopsy consistent with a benign follicular nodule.  tmg  Armandina Gemma, MD [facility] [place] practice Office: [phone]

## 2021-07-03 ENCOUNTER — Other Ambulatory Visit: Payer: Self-pay | Admitting: *Deleted

## 2021-07-03 ENCOUNTER — Ambulatory Visit: Payer: BC Managed Care – PPO

## 2021-07-03 ENCOUNTER — Other Ambulatory Visit: Payer: Self-pay

## 2021-07-03 VITALS — Wt 144.2 lb

## 2021-07-03 DIAGNOSIS — Z170 Estrogen receptor positive status [ER+]: Secondary | ICD-10-CM

## 2021-07-03 DIAGNOSIS — C50811 Malignant neoplasm of overlapping sites of right female breast: Secondary | ICD-10-CM

## 2021-07-03 DIAGNOSIS — Z483 Aftercare following surgery for neoplasm: Secondary | ICD-10-CM

## 2021-07-03 NOTE — Therapy (Signed)
[facility] @ [address] [place], [place], [address] Phone: [phone]   Fax:  [phone]  Physical Therapy Treatment  Patient Details  Name: Teresa Sheppard MRN: [id number] Date of Birth: 01/30/1961 Referring Provider (PT): Dr Lindi Adie   Encounter Date: 07/03/2021   PT End of Session - 07/03/21 1611     Visit Number 4   # unchanged due to screen only   PT Start Time 1608    PT Stop Time 4562    PT Time Calculation (min) 5 min    Activity Tolerance Patient tolerated treatment well    Behavior During Therapy [facility] for tasks assessed/performed             Past Medical History:  Diagnosis Date   Anxiety    Breast cancer in female [facility])    Right   Family history of breast cancer 11/07/2020   Family history of pancreatic cancer 11/07/2020   History of kidney stones 2014-2015   Hyperthyroidism    No longer an issue   PONV (postoperative nausea and vomiting)    Thyromegaly     Past Surgical History:  Procedure Laterality Date   ABDOMINAL WALL DEFECT REPAIR     following hernia repair in 2010   [place]     2008  while pregnant with last child. Scar tissue from appendectomy wrapped around bowel and was snipped to free bowel.    CESAREAN SECTION     DILATION AND CURETTAGE OF UTERUS     2000 and 2014   HERNIA REPAIR     MASTECTOMY W/ SENTINEL NODE BIOPSY Right 11/28/2020   Procedure: RIGHT MASTECTOMY WITH RIGHT AXILLARY SENTINEL LYMPH NODE BIOPSY;  Surgeon: [name], MD;  Location: [place];  Service: General;  Laterality: Right;   PORT-A-CATH REMOVAL Left 01/30/2021   Procedure: PORT REMOVAL;  Surgeon: [name], MD;  Location: [place];  Service: General;  Laterality: Left;  LOCAL   PORTACATH PLACEMENT Left 07/12/2020   Procedure: INSERTION PORT-A-CATH WITH ULTRASOUND GUIDANCE;  Surgeon: [name], MD;  Location: [place];  Service: General;  Laterality: Left;   RADIOACTIVE  SEED GUIDED AXILLARY SENTINEL LYMPH NODE Right 11/28/2020   Procedure: RADIOACTIVE SEED GUIDED RIGHT AXILLARY AXILLARY SENTINEL LYMPH NODE EXCISION;  Surgeon: [name], MD;  Location: [place];  Service: General;  Laterality: Right;   RADIOACTIVE SEED GUIDED EXCISIONAL BREAST BIOPSY Left 11/28/2020   Procedure: RADIOACTIVE SEED GUIDED LEFT EXCISIONAL BREAST BIOPSY;  Surgeon: [name], MD;  Location: [place];  Service: General;  Laterality: Left;   ROOT CANAL     2019   TONSILLECTOMY  1969   URETERAL EXPLORATION     dilataion for congenitally small ureter    Vitals:   07/03/21 1610  Weight: 144 lb 4 oz (65.4 kg)     Subjective Assessment - 07/03/21 1607     Subjective Pt returns for her 3 month L-Dex screen. I fell and broke my wrist and had surgery last week. I now have a plate in my wrist.    Pertinent History R breast cancer, left outer quadrant, triple positive, Ki 67 - 60%, grade 2 -3 IDC, neoadjuvant chemo and still is getting immuno therapy,  right mastectomy on 11/28/2020 with 4 nodes removed on the right and she had an excision of breast tissue on the left that is benign  and just started radiation  L-DEX FLOWSHEETS - 07/03/21 1600       L-DEX LYMPHEDEMA SCREENING   Measurement Type Unilateral    L-DEX MEASUREMENT EXTREMITY Upper Extremity    POSITION  Standing    DOMINANT SIDE Right    At Risk Side Right    BASELINE SCORE (UNILATERAL) -3.9    L-DEX SCORE (UNILATERAL) -27.2   pt now has plate in wrist so score is off chart   VALUE CHANGE (UNILAT) -23.3                                  PT Short Term Goals - 06/09/21 1213       PT SHORT TERM GOAL #1   Title Pt will be independent with initial HEP.    Status Achieved               PT Long Term Goals - 06/30/21 1043       PT LONG TERM GOAL #1   Title Pt will be independent with advanced HEP.    Status Achieved      PT LONG TERM GOAL #2   Title  Pt will be able to return to her floor/mat exercise program without dizziness.    Status Achieved   denies dizziness with dancing exercise program.     PT LONG TERM GOAL #3   Title Pt will be able to walk her dog without increased dizziness.    Status Achieved                   Plan - 07/03/21 1721     Clinical Impression Statement Pt returns for her 3 month L-Dex screen. Pt had a fall that resulted in a fractured wrist. Due to this she had surgery last week and now has aplate in her wrist. Her change from baseline of -23.3 reflects the new hardware which will now affect each of her SOZO screens putting her "off the chart". After discussing with Leone Payor, PT decided that pt will no longer be able to continue SOZO as the plate will affect each measurement. Explained this to pt and her husband who was also present and they verbalized understanding.    PT Next Visit Plan Will not cont L-Dex screen due to new hardware (plate) in her wrist affecting score from SOZO.    Consulted and Agree with Plan of Care Patient             Patient will benefit from skilled therapeutic intervention in order to improve the following deficits and impairments:     Visit Diagnosis: Aftercare following surgery for neoplasm     Problem List Patient Active Problem List   Diagnosis Date Noted   S/P mastectomy, left 11/28/2020   Genetic testing 11/15/2020   Family history of breast cancer 11/07/2020   Family history of pancreatic cancer 11/07/2020   Port-A-Cath in place 10/26/2020   Dysphagia 10/26/2020   Acute urinary tract infection 10/14/2020   Malignant tumor of breast ([place]) 06/22/2020   Malignant neoplasm of overlapping sites of right breast in female, estrogen receptor positive ([place]) 06/21/2020   Hyperthyroidism 03/28/2016    Otelia Sheppard, PTA 07/03/2021, 5:24 PM  [facility] @ [address] [place], [place],  [address] Phone: [phone]   Fax:  [phone]  Name: Teresa Sheppard MRN: [id number] Date of Birth: 06/18/1961

## 2021-07-04 ENCOUNTER — Encounter: Payer: Self-pay | Admitting: *Deleted

## 2021-07-04 ENCOUNTER — Other Ambulatory Visit: Payer: Self-pay | Admitting: *Deleted

## 2021-07-04 ENCOUNTER — Inpatient Hospital Stay: Payer: BC Managed Care – PPO | Attending: Hematology and Oncology | Admitting: Hematology and Oncology

## 2021-07-04 ENCOUNTER — Inpatient Hospital Stay: Payer: BC Managed Care – PPO

## 2021-07-04 DIAGNOSIS — Z170 Estrogen receptor positive status [ER+]: Secondary | ICD-10-CM

## 2021-07-04 DIAGNOSIS — C50811 Malignant neoplasm of overlapping sites of right female breast: Secondary | ICD-10-CM

## 2021-07-04 LAB — CBC WITH DIFFERENTIAL (CANCER CENTER ONLY)
Abs Immature Granulocytes: 0.02 10*3/uL (ref 0.00–0.07)
Basophils Absolute: 0.1 10*3/uL (ref 0.0–0.1)
Basophils Relative: 1 %
Eosinophils Absolute: 0.2 10*3/uL (ref 0.0–0.5)
Eosinophils Relative: 4 %
HCT: 39.9 % (ref 36.0–46.0)
Hemoglobin: 13.5 g/dL (ref 12.0–15.0)
Immature Granulocytes: 0 %
Lymphocytes Relative: 14 %
Lymphs Abs: 0.7 10*3/uL (ref 0.7–4.0)
MCH: 32 pg (ref 26.0–34.0)
MCHC: 33.8 g/dL (ref 30.0–36.0)
MCV: 94.5 fL (ref 80.0–100.0)
Monocytes Absolute: 0.6 10*3/uL (ref 0.1–1.0)
Monocytes Relative: 13 %
Neutro Abs: 3.3 10*3/uL (ref 1.7–7.7)
Neutrophils Relative %: 68 %
Platelet Count: 131 10*3/uL — ABNORMAL LOW (ref 150–400)
RBC: 4.22 MIL/uL (ref 3.87–5.11)
RDW: 12.6 % (ref 11.5–15.5)
WBC Count: 4.9 10*3/uL (ref 4.0–10.5)
nRBC: 0 % (ref 0.0–0.2)

## 2021-07-04 NOTE — Assessment & Plan Note (Signed)
06/21/2020:Patient palpated a right breast mass x 1 wk. Mammogram showed multiple confluent masses in the right breast at the 7 o'clock position measuring 2.3cm and at the 9 o'clock position measuring 2.2cm with one right axillary lymph node with cortical thickening. Biopsy showed invasive mammary carcinoma at both positions and in the axilla, grade 2-3, HER-2 equivocal by IHC (2+), positive by FISH (ratio 3.34), ER+ >95%, PR+ 25%, Ki67 60%.  Treatment plan: 1. Neoadjuvant chemotherapy with [place] Perjeta 6 cyclescompleted 5/4/2022followed by HerceptinPerjeta versus Kadcylamaintenance for 1 year 2.Right mastectomy [name]): IDC, grade 3, spanning 4.0cm fibrotic area, clear margins, 1/4 right axillary lymph nodes positive for carcinoma, 0.5cm. Left lumpectomy: no evidence of malignancy 3. Followed by adjuvant radiation therapycompleted 02/17/2021 4.Followed by adjuvant antiestrogen therapystarted 12/29/2020 5.Followed by neratinib ---------------------------------------------------------------------------------------------------------------------------------- Current Treatment: Kadcyla Maintenancewith letrozole Kadcyla on hold until the holidays are done.  Letrozole toxicities: Tolerating it extremely well.   Surveillance: Mammograms June 08, 2021 at [facility]: Benign Bone density: November 2022: T score -2.4: Recommended Prolia along with calcium and vitamin D.  Prolia counseling: We discussed extensively about Prolia risks and benefits.  She understands the risks and benefits and will likely start Prolia after she completes her dental work-up in February or March 2023.  She plans to go to [place] in February.  She wonders if her immune system would be able to tolerate the travel.  I recommended that she go ahead and make arrangements to proceed with the travel plan.

## 2021-07-04 NOTE — Progress Notes (Signed)
Per MD okay to proceed with tx with Echo from 02/24/21. Orders received and placed from MD to obtain echo within the next week.

## 2021-07-04 NOTE — Progress Notes (Signed)
Per MD request RN successfully faxed referral to Dr. Chucky May Phycologist [phone]).

## 2021-07-04 NOTE — Progress Notes (Signed)
Per MD due to pt bruise and recent left wrist surgery, okay to proceed with treatment today with finger stick CBC only.

## 2021-07-04 NOTE — Progress Notes (Signed)
Patient Care Team: Wenda Low, MD as PCP - General (Internal Medicine) Rockwell Germany, RN as Oncology Nurse Navigator Mauro Kaufmann, RN as Oncology Nurse Navigator [name], MD as Consulting Physician (General Surgery) Nicholas Lose, MD as Consulting Physician (Hematology and Oncology) Kyung Rudd, MD as Consulting Physician (Radiation Oncology)  DIAGNOSIS:  Encounter Diagnosis  Name Primary?   Malignant neoplasm of overlapping sites of right breast in female, estrogen receptor positive ([place])     SUMMARY OF ONCOLOGIC HISTORY: Oncology History  Malignant neoplasm of overlapping sites of right breast in female, estrogen receptor positive ([place])  06/21/2020 Initial Diagnosis   Patient palpated a right breast mass x 1 wk. Mammogram showed multiple confluent masses in the right breast at the 7 o'clock position measuring 2.3cm and at the 9 o'clock position measuring 2.2cm with one right axillary lymph node with cortical thickening. Biopsy showed invasive mammary carcinoma at both positions and in the axilla, grade 2-3, HER-2 equivocal by IHC (2+), positive by FISH (ratio 3.34), ER+ >95%, PR+ 25%, Ki67 60%.   06/29/2020 Cancer Staging   Staging form: Breast, AJCC 8th Edition - Clinical stage from 06/29/2020: Stage IB (cT2, cN1(f), cM0, G3, ER+, PR+, HER2+) - Signed by Nicholas Lose, MD on 06/29/2020    07/13/2020 - 10/26/2020 Chemotherapy      Patient is on Antibody Plan: BREAST TRASTUZUMAB + PERTUZUMAB Q21D     10/28/2020 Breast MRI   Complete imaging response   11/14/2020 Genetic Testing   Negative hereditary cancer genetic testing: no pathogenic variants detected in [facility] BRCAPlus Panel and [facility] CancerNext-Expanded +RNAinsight Panel.  The report dates are Nov 14, 2020 and December 05, 2020, respectively.    The BRCAplus panel offered by [employer] and includes sequencing and deletion/duplication analysis for the following 8 genes: ATM, BRCA1, BRCA2, CDH1, CHEK2, PALB2,  PTEN, and TP53.  The CancerNext-Expanded gene panel offered by [facility] and includes sequencing, rearrangement, and RNA analysis for the following 77 genes: AIP, ALK, APC, ATM, AXIN2, BAP1, BARD1, BLM, BMPR1A, BRCA1, BRCA2, BRIP1, CDC73, CDH1, CDK4, CDKN1B, CDKN2A, CHEK2, CTNNA1, DICER1, FANCC, FH, FLCN, GALNT12, KIF1B, LZTR1, MAX, MEN1, MET, MLH1, MSH2, MSH3, MSH6, MUTYH, NBN, NF1, NF2, NTHL1, PALB2, PHOX2B, PMS2, POT1, PRKAR1A, PTCH1, PTEN, RAD51C, RAD51D, RB1, RECQL, RET, SDHA, SDHAF2, SDHB, SDHC, SDHD, SMAD4, SMARCA4, SMARCB1, SMARCE1, STK11, SUFU, TMEM127, TP53, TSC1, TSC2, VHL and XRCC2 (sequencing and deletion/duplication); EGFR, EGLN1, HOXB13, KIT, MITF, PDGFRA, POLD1, and POLE (sequencing only); EPCAM and GREM1 (deletion/duplication only).    11/28/2020 Surgery   Right mastectomy [name]): IDC, grade 3, spanning 4.0cm fibrotic area, clear margins, 1/4 right axillary lymph nodes positive for carcinoma, 0.5cm.   Left lumpectomy: no evidence of malignancy   12/07/2020 - 05/23/2021 Chemotherapy   Patient is on Treatment Plan : BREAST ADO-Trastuzumab Emtansine (Kadcyla) q21d     01/04/2021 - 02/17/2021 Radiation Therapy   Adjuvant radiation     CHIEF COMPLIANT: Follow-up to discuss the Kadcyla treatment plan  INTERVAL HISTORY: Teresa Sheppard is a 61 year old above-mentioned history of HER2 positive breast cancer treated with neoadjuvant chemotherapy followed by mastectomy and adjuvant Kadcyla because she had residual disease after neoadjuvant chemo.  She received Kadcyla.  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  Unfortunately she fell and.  Her left hand had surgery.  She is recovering from the surgery.  She is accompanied today by her husband and her daughter. She  met with our psychologist and behavioral specialist Dr. Cari Caraway.  But she could not connect to her and therefore she is requesting another  provider for her.  She is also having some medical issues for which she would like to have some counseling.   ALLERGIES:  is allergic to shellfish-derived products and penicillins.  MEDICATIONS:  Current Outpatient Medications  Medication Sig Dispense Refill   acetaminophen (TYLENOL) 325 MG tablet Take 325 mg by mouth every 6 (six) hours as needed (headaches / pain).     Calcium-Vitamin D-Vitamin K ([employer] CALCIUM SOFT CHEWS) 650-12.5-40 MG-MCG-MCG CHEW Chew 1 tablet by mouth 2 (two) times daily.     cholecalciferol (VITAMIN D3) 25 MCG (1000 UNIT) tablet Take 1,000 Units by mouth daily.     Cranberry-Vitamin C 250-60 MG CAPS Take 1 tablet by mouth in the morning and at bedtime.     hydrocortisone cream 1 % Apply 1 application topically 4 (four) times daily as needed for itching.     letrozole (FEMARA) 2.5 MG tablet Take 1 tablet (2.5 mg total) by mouth daily. 90 tablet 3   loratadine (CLARITIN) 10 MG tablet Take 10 mg by mouth daily.     Multiple Vitamins-Minerals (HAIR/SKIN/NAILS/BIOTIN PO) Take 1 capsule by mouth daily.     Polyethyl Glycol-Propyl Glycol (SYSTANE OP) Place 1 drop into both eyes daily as needed (dry eyes).     Wound Cleansers (RADIAPLEX EX) Apply 1 application topically daily as needed (radiation).     No current facility-administered medications for this visit.   Facility-Administered Medications Ordered in Other Visits  Medication Dose Route Frequency Provider Last Rate Last Admin   sodium chloride flush (NS) 0.9 % injection 10 mL  10 mL Intracatheter Once Nicholas Lose, MD       sodium chloride flush (NS) 0.9 % injection 10 mL  10 mL Intracatheter Once Nicholas Lose, MD        PHYSICAL EXAMINATION: ECOG PERFORMANCE STATUS: 1 - Symptomatic but completely ambulatory  Vitals:   07/04/21 1055  BP: 133/68  Pulse: 73  Resp: 18  Temp: 98.1 F (36.7 C)  SpO2: 100%   Filed Weights   07/04/21 1055  Weight: 144 lb 8 oz (65.5 kg)      LABORATORY DATA:  I have  reviewed the data as listed CMP Latest Ref Rng & Units 05/23/2021 05/02/2021 04/10/2021  Glucose 70 - 99 mg/dL 102(H) 103(H) 98  BUN 6 - 20 mg/dL 13 10 11   Creatinine 0.44 - 1.00 mg/dL 0.73 0.77 0.79  Sodium 135 - 145 mmol/L 137 141 139  Potassium 3.5 - 5.1 mmol/L 4.1 3.7 3.9  Chloride 98 - 111 mmol/L 105 107 106  CO2 22 - 32 mmol/L 26 24 23   Calcium 8.9 - 10.3 mg/dL 9.5 9.4 9.6  Total Protein 6.5 - 8.1 g/dL 7.8 7.6 7.4  Total Bilirubin 0.3 - 1.2 mg/dL 0.7 0.6 0.6  Alkaline Phos 38 - 126 U/L 136(H) 134(H) 148(H)  AST 15 - 41 U/L 56(H) 53(H) 59(H)  ALT 0 - 44 U/L 34 34 39    Lab Results  Component Value Date   WBC 4.9 07/04/2021   HGB 13.5 07/04/2021   HCT 39.9 07/04/2021   MCV 94.5 07/04/2021   PLT 131 (L) 07/04/2021   NEUTROABS 3.3 07/04/2021    ASSESSMENT & PLAN:  Malignant neoplasm of overlapping sites of right breast in female, estrogen receptor positive ([place]) 06/21/2020:Patient palpated a right breast mass x 1 wk. Mammogram showed multiple confluent  masses in the right breast at the 7 o'clock position measuring 2.3cm and at the 9 o'clock position measuring 2.2cm with one right axillary lymph node with cortical thickening. Biopsy showed invasive mammary carcinoma at both positions and in the axilla, grade 2-3, HER-2 equivocal by IHC (2+), positive by FISH (ratio 3.34), ER+ >95%, PR+ 25%, Ki67 60%.   Treatment plan: 1. Neoadjuvant chemotherapy with TCH Perjeta 6 cycles completed 10/26/2020 followed by Herceptin Perjeta versus [name] for 1 year 2.  Right mastectomy [name]): IDC, grade 3, spanning 4.0cm fibrotic area, clear margins, 1/4 right axillary lymph nodes positive for carcinoma, 0.5cm. Left lumpectomy: no evidence of malignancy 3. Followed by adjuvant radiation therapy completed 02/17/2021 4.  Followed by adjuvant antiestrogen therapy started 12/29/2020 5.  Followed by  neratinib ---------------------------------------------------------------------------------------------------------------------------------- Current Treatment: [name] with letrozole (on Kadcyla discontinued: Last Kadcyla 05/23/2021)   Letrozole toxicities: Tolerating it extremely well.    Surveillance: Mammograms June 08, 2021 at [facility]: Benign Bone density: November 2022: T score -2.4: Recommended Prolia along with calcium and vitamin D. She is contemplating on going to start Prolia.  She might start Prolia in March but she will call us and schedule it.   Prolia counseling: We discussed extensively about Prolia risks and benefits.  She understands the risks and benefits and will likely start Prolia after she completes her dental work-up in February or March 2023.   She plans to go to [place] in February.    Anxiety/depression/medical issues: We will refer her to Dr. Toy Care. We also discussed the pros and cons of neratinib and she will consider neratinib after July (after her daughter graduates)  Breast cancer surveillance: Annual mammograms and breast MRIs. Return to clinic in 3 months for follow-up   No orders of the defined types were placed in this encounter.  The patient has a good understanding of the overall plan. she agrees with it. she will call with any problems that may develop before the next visit here. Total time spent: 30 mins including face to face time and time spent for planning, charting and co-ordination of care   Harriette Ohara, MD 07/04/21

## 2021-07-05 ENCOUNTER — Ambulatory Visit: Payer: BC Managed Care – PPO | Admitting: Psychologist

## 2021-07-05 ENCOUNTER — Encounter: Payer: Self-pay | Admitting: *Deleted

## 2021-07-07 ENCOUNTER — Encounter: Payer: Self-pay | Admitting: Hematology and Oncology

## 2021-07-12 ENCOUNTER — Encounter: Payer: Self-pay | Admitting: Hematology and Oncology

## 2021-07-12 ENCOUNTER — Encounter: Payer: BC Managed Care – PPO | Admitting: Rehabilitative and Restorative Service Providers"

## 2021-07-13 DIAGNOSIS — C50811 Malignant neoplasm of overlapping sites of right female breast: Secondary | ICD-10-CM | POA: Diagnosis not present

## 2021-07-13 DIAGNOSIS — Z170 Estrogen receptor positive status [ER+]: Secondary | ICD-10-CM | POA: Diagnosis not present

## 2021-07-20 ENCOUNTER — Encounter: Payer: BC Managed Care – PPO | Admitting: Rehabilitative and Restorative Service Providers"

## 2021-07-20 ENCOUNTER — Encounter: Payer: Self-pay | Admitting: Hematology and Oncology

## 2021-07-25 ENCOUNTER — Encounter: Payer: BC Managed Care – PPO | Admitting: Adult Health

## 2021-07-25 DIAGNOSIS — S52572D Other intraarticular fracture of lower end of left radius, subsequent encounter for closed fracture with routine healing: Secondary | ICD-10-CM | POA: Diagnosis not present

## 2021-07-26 ENCOUNTER — Inpatient Hospital Stay: Payer: BC Managed Care – PPO | Attending: Hematology and Oncology | Admitting: Adult Health

## 2021-07-26 ENCOUNTER — Encounter: Payer: Self-pay | Admitting: Adult Health

## 2021-07-26 ENCOUNTER — Other Ambulatory Visit: Payer: Self-pay

## 2021-07-26 VITALS — BP 121/66 | HR 61 | Temp 97.7°F | Resp 16 | Ht 70.0 in | Wt 144.7 lb

## 2021-07-26 DIAGNOSIS — E042 Nontoxic multinodular goiter: Secondary | ICD-10-CM | POA: Insufficient documentation

## 2021-07-26 DIAGNOSIS — Z170 Estrogen receptor positive status [ER+]: Secondary | ICD-10-CM | POA: Insufficient documentation

## 2021-07-26 DIAGNOSIS — Z79811 Long term (current) use of aromatase inhibitors: Secondary | ICD-10-CM | POA: Insufficient documentation

## 2021-07-26 DIAGNOSIS — C50811 Malignant neoplasm of overlapping sites of right female breast: Secondary | ICD-10-CM | POA: Diagnosis not present

## 2021-07-26 DIAGNOSIS — C773 Secondary and unspecified malignant neoplasm of axilla and upper limb lymph nodes: Secondary | ICD-10-CM | POA: Insufficient documentation

## 2021-07-26 MED ORDER — CICLOPIROX 8 % EX SOLN: Freq: Every day | CUTANEOUS | 0 refills | Status: DC

## 2021-07-26 MED ORDER — AZITHROMYCIN 250 MG PO TABS: ORAL_TABLET | ORAL | 0 refills | Status: DC

## 2021-07-26 NOTE — Assessment & Plan Note (Addendum)
06/21/2020:Patient palpated a right breast mass x 1 wk. Mammogram showed multiple confluent masses in the right breast at the 7 o'clock position measuring 2.3cm and at the 9 o'clock position measuring 2.2cm with one right axillary lymph node with cortical thickening. Biopsy showed invasive mammary carcinoma at both positions and in the axilla, grade 2-3, HER-2 equivocal by IHC (2+), positive by FISH (ratio 3.34), ER+ >95%, PR+ 25%, Ki67 60%.  Treatment plan: 1. Neoadjuvant chemotherapy with [place] Perjeta 6 cyclescompleted 5/4/2022followed by HerceptinPerjeta versus Kadcylamaintenance for 1 year 2.Right mastectomy [name]): IDC, grade 3, spanning 4.0cm fibrotic area, clear margins, 1/4 right axillary lymph nodes positive for carcinoma, 0.5cm. Left lumpectomy: no evidence of malignancy 3. Followed by adjuvant radiation therapycompleted 02/17/2021 4.Followed by adjuvant antiestrogen therapystarted 12/29/2020 5.Followed by neratinib ---------------------------------------------------------------------------------------------------------------------------------- Current Treatment: Kadcyla Maintenancewith letrozole  Kadcyla on hold until the holidays are done.  Letrozole toxicities: Tolerating it extremely well.   Surveillance: Mammograms June 08, 2021 at [facility]: Benign Bone density: November 2022: T score -2.4: Recommended Prolia along with calcium and vitamin D.  Celisse came in to discuss her nail concerns.  I sent in penlac for her to apply to her nails and nail beds.  I also sent in a Zpak for her to take only if she gets upper respiratory infection in [place] during her travel since she has experienced recent exposures.    We will see her back in 2 months for her survivorship visit.

## 2021-07-26 NOTE — Progress Notes (Signed)
[facility] up:    Wenda Low, MD [address] [place] [address]   DIAGNOSIS:  Cancer Staging  Malignant neoplasm of overlapping sites of right breast in female, estrogen receptor positive ([place]) Staging form: Breast, AJCC 8th Edition - Clinical stage from 06/29/2020: Stage IB (cT2, cN1(f), cM0, G3, ER+, PR+, HER2+) - Signed by Nicholas Lose, MD on 06/29/2020 Stage prefix: Initial diagnosis Method of lymph node assessment: Core biopsy - Pathologic stage from 11/28/2020: No Stage Recommended (ypT2, pN1a, cM0) - Signed by Gardenia Phlegm, NP on 07/26/2021 Stage prefix: Post-therapy   SUMMARY OF ONCOLOGIC HISTORY: Oncology History  Malignant neoplasm of overlapping sites of right breast in female, estrogen receptor positive ([place])  06/21/2020 Initial Diagnosis   Patient palpated a right breast mass x 1 wk. Mammogram showed multiple confluent masses in the right breast at the 7 o'clock position measuring 2.3cm and at the 9 o'clock position measuring 2.2cm with one right axillary lymph node with cortical thickening. Biopsy showed invasive mammary carcinoma at both positions and in the axilla, grade 2-3, HER-2 equivocal by IHC (2+), positive by FISH (ratio 3.34), ER+ >95%, PR+ 25%, Ki67 60%.   06/29/2020 Cancer Staging   Staging form: Breast, AJCC 8th Edition - Clinical stage from 06/29/2020: Stage IB (cT2, cN1(f), cM0, G3, ER+, PR+, HER2+) - Signed by Nicholas Lose, MD on 06/29/2020    07/13/2020 - 11/16/2020 Neo-Adjuvant Chemotherapy   Taxotere, Carbo, Herceptin, Perjeta x 6 followed by 1 cycle of herceptin/perjeta   10/28/2020 Breast MRI   Complete imaging response   11/14/2020 Genetic Testing   Negative hereditary cancer genetic testing: no pathogenic variants detected in [employer] BRCAPlus Panel and [facility] CancerNext-Expanded +RNAinsight Panel.  The report dates are Nov 14, 2020 and December 05, 2020, respectively.    The BRCAplus panel offered by  [employer] and includes sequencing and deletion/duplication analysis for the following 8 genes: ATM, BRCA1, BRCA2, CDH1, CHEK2, PALB2, PTEN, and TP53.  The CancerNext-Expanded gene panel offered by [facility] and includes sequencing, rearrangement, and RNA analysis for the following 77 genes: AIP, ALK, APC, ATM, AXIN2, BAP1, BARD1, BLM, BMPR1A, BRCA1, BRCA2, BRIP1, CDC73, CDH1, CDK4, CDKN1B, CDKN2A, CHEK2, CTNNA1, DICER1, FANCC, FH, FLCN, GALNT12, KIF1B, LZTR1, MAX, MEN1, MET, MLH1, MSH2, MSH3, MSH6, MUTYH, NBN, NF1, NF2, NTHL1, PALB2, PHOX2B, PMS2, POT1, PRKAR1A, PTCH1, PTEN, RAD51C, RAD51D, RB1, RECQL, RET, SDHA, SDHAF2, SDHB, SDHC, SDHD, SMAD4, SMARCA4, SMARCB1, SMARCE1, STK11, SUFU, TMEM127, TP53, TSC1, TSC2, VHL and XRCC2 (sequencing and deletion/duplication); EGFR, EGLN1, HOXB13, KIT, MITF, PDGFRA, POLD1, and POLE (sequencing only); EPCAM and GREM1 (deletion/duplication only).    11/28/2020 Surgery   Right mastectomy [name]): IDC, grade 3, spanning 4.0cm fibrotic area, clear margins, 1/4 right axillary lymph nodes positive for carcinoma, 0.5cm.   Left lumpectomy: no evidence of malignancy   11/28/2020 Cancer Staging   Staging form: Breast, AJCC 8th Edition - Pathologic stage from 11/28/2020: No Stage Recommended (ypT2, pN1a, cM0) - Signed by Gardenia Phlegm, NP on 07/26/2021 Stage prefix: Post-therapy    12/07/2020 - 05/23/2021 Chemotherapy   Patient is on Treatment Plan : BREAST ADO-Trastuzumab Emtansine (Kadcyla) q21d     01/04/2021 - 02/17/2021 Radiation Therapy   Adjuvant radiation   12/2020 -  Anti-estrogen oral therapy   Letrozole daily     CURRENT THERAPY: Letrozole daily  INTERVAL HISTORY: Teresa Sheppard 61 y.o. female returns for evaluation of nail dyscrasia that began during chemotherapy.  She is preparing to go  on a trip and wants some information on what to do about her nails.  She has noted the discoloration since receiving chemotherapy, however the thickness,  particularly in the great toe of each foot (left worse than right) is concerning to her.  The nails have started to grow out.  She has also noted folding int he right second digit fingernail bed cuticle.  There is no erythema, swelling, sign of abscess in any of her fingers/toes and nail beds.     Patient Active Problem List   Diagnosis Date Noted   S/P mastectomy, left 11/28/2020   Genetic testing 11/15/2020   Family history of breast cancer 11/07/2020   Family history of pancreatic cancer 11/07/2020   Port-A-Cath in place 10/26/2020   Dysphagia 10/26/2020   Acute urinary tract infection 10/14/2020   Malignant tumor of breast ([place]) 06/22/2020   Malignant neoplasm of overlapping sites of right breast in female, estrogen receptor positive ([place]) 06/21/2020   Hyperthyroidism 03/28/2016    is allergic to shellfish-derived products and penicillins.  MEDICAL HISTORY: Past Medical History:  Diagnosis Date   Anxiety    Breast cancer in female [facility])    Right   Family history of breast cancer 11/07/2020   Family history of pancreatic cancer 11/07/2020   History of kidney stones 2014-2015   Hyperthyroidism    No longer an issue   PONV (postoperative nausea and vomiting)    Thyromegaly     SURGICAL HISTORY: Past Surgical History:  Procedure Laterality Date   ABDOMINAL WALL DEFECT REPAIR     following hernia repair in 2010   [place]     2008  while pregnant with last child. Scar tissue from appendectomy wrapped around bowel and was snipped to free bowel.    CESAREAN SECTION     DILATION AND CURETTAGE OF UTERUS     2000 and 2014   HERNIA REPAIR     MASTECTOMY W/ SENTINEL NODE BIOPSY Right 11/28/2020   Procedure: RIGHT MASTECTOMY WITH RIGHT AXILLARY SENTINEL LYMPH NODE BIOPSY;  Surgeon: [name], MD;  Location: [facility];  Service: General;  Laterality: Right;   PORT-A-CATH REMOVAL Left 01/30/2021   Procedure: PORT REMOVAL;  Surgeon: [name],  MD;  Location: [facility];  Service: General;  Laterality: Left;  LOCAL   PORTACATH PLACEMENT Left 07/12/2020   Procedure: INSERTION PORT-A-CATH WITH ULTRASOUND GUIDANCE;  Surgeon: [name], MD;  Location: [facility];  Service: General;  Laterality: Left;   RADIOACTIVE SEED GUIDED AXILLARY SENTINEL LYMPH NODE Right 11/28/2020   Procedure: RADIOACTIVE SEED GUIDED RIGHT AXILLARY AXILLARY SENTINEL LYMPH NODE EXCISION;  Surgeon: [name], MD;  Location: [facility];  Service: General;  Laterality: Right;   RADIOACTIVE SEED GUIDED EXCISIONAL BREAST BIOPSY Left 11/28/2020   Procedure: RADIOACTIVE SEED GUIDED LEFT EXCISIONAL BREAST BIOPSY;  Surgeon: [name], MD;  Location: [facility];  Service: General;  Laterality: Left;   ROOT CANAL     2019   TONSILLECTOMY  1969   URETERAL EXPLORATION     dilataion for congenitally small ureter    SOCIAL HISTORY: Social History   Socioeconomic History   Marital status: Married    Spouse name: Not on file   Number of children: Not on file   Years of education: Not on file   Highest education level: Not on file  Occupational History   Not on file  Tobacco Use   Smoking status: Never   Smokeless tobacco: Never  Vaping  Use   Vaping Use: Never used  Substance and Sexual Activity   Alcohol use: Yes    Comment: occas   Drug use: Never   Sexual activity: Not Currently  Other Topics Concern   Not on file  Social History Narrative   Not on file   Social Determinants of Health   Financial Resource Strain: Not on file  Food Insecurity: Not on file  Transportation Needs: Not on file  Physical Activity: Not on file  Stress: Not on file  Social Connections: Not on file  Intimate Partner Violence: Not on file    FAMILY HISTORY: Family History  Problem Relation Age of Onset   Breast cancer Mother 43       metastatic   Breast cancer Sister 38   Breast cancer Maternal Aunt        dx 43s   Pancreatic cancer Father 44   Pancreatic cancer  Paternal Grandfather        dx 33s   Breast cancer Sister 74   Leukemia Nephew 32   Cancer Maternal Aunt        unknown type; ?bone; dx late 85s    Review of Systems  Constitutional:  Negative for appetite change, chills, fatigue, fever and unexpected weight change.  HENT:   Negative for hearing loss, lump/mass and trouble swallowing.   Eyes:  Negative for eye problems and icterus.  Respiratory:  Negative for chest tightness, cough and shortness of breath.   Cardiovascular:  Negative for chest pain, leg swelling and palpitations.  Gastrointestinal:  Negative for abdominal distention, abdominal pain, constipation, diarrhea, nausea and vomiting.  Endocrine: Negative for hot flashes.  Genitourinary:  Negative for difficulty urinating.   Musculoskeletal:  Negative for arthralgias.  Skin:  Negative for itching and rash.  Neurological:  Negative for dizziness, extremity weakness, headaches and numbness.  Hematological:  Negative for adenopathy. Does not bruise/bleed easily.  Psychiatric/Behavioral:  Negative for depression. The patient is not nervous/anxious.      PHYSICAL EXAMINATION  ECOG PERFORMANCE STATUS: 1 - Symptomatic but completely ambulatory  Vitals:   07/26/21 1237  BP: 121/66  Pulse: 61  Resp: 16  Temp: 97.7 F (36.5 C)  SpO2: 100%    Physical Exam Constitutional:      General: She is not in acute distress.    Appearance: Normal appearance. She is not toxic-appearing.  HENT:     Head: Normocephalic and atraumatic.  Eyes:     General: No scleral icterus. Cardiovascular:     Rate and Rhythm: Normal rate and regular rhythm.     Pulses: Normal pulses.     Heart sounds: Normal heart sounds.  Pulmonary:     Effort: Pulmonary effort is normal.     Breath sounds: Normal breath sounds.  Abdominal:     General: Abdomen is flat. Bowel sounds are normal. There is no distension.     Palpations: Abdomen is soft.     Tenderness: There is no abdominal tenderness.   Musculoskeletal:        General: No swelling.     Cervical back: Neck supple.  Lymphadenopathy:     Cervical: No cervical adenopathy.  Skin:    General: Skin is warm and dry.     Findings: No rash.     Comments: Nails are positive for discoloration and thicknessworse in bilateral great toenail.  Right second digit fingailbed with slight thickness, no warmth, erythema or sign of infection  Neurological:     General:  No focal deficit present.     Mental Status: She is alert.  Psychiatric:        Mood and Affect: Mood normal.        Behavior: Behavior normal.    LABORATORY DATA:  CBC    Component Value Date/Time   WBC 4.9 07/04/2021 1042   WBC 2.9 (L) 05/23/2021 1053   RBC 4.22 07/04/2021 1042   HGB 13.5 07/04/2021 1042   HCT 39.9 07/04/2021 1042   PLT 131 (L) 07/04/2021 1042   MCV 94.5 07/04/2021 1042   MCH 32.0 07/04/2021 1042   MCHC 33.8 07/04/2021 1042   RDW 12.6 07/04/2021 1042   LYMPHSABS 0.7 07/04/2021 1042   MONOABS 0.6 07/04/2021 1042   EOSABS 0.2 07/04/2021 1042   BASOSABS 0.1 07/04/2021 1042    CMP     Component Value Date/Time   NA 137 05/23/2021 1057   K 4.1 05/23/2021 1057   CL 105 05/23/2021 1057   CO2 26 05/23/2021 1057   GLUCOSE 102 (H) 05/23/2021 1057   BUN 13 05/23/2021 1057   CREATININE 0.73 05/23/2021 1057   CALCIUM 9.5 05/23/2021 1057   PROT 7.8 05/23/2021 1057   ALBUMIN 4.0 05/23/2021 1057   AST 56 (H) 05/23/2021 1057   ALT 34 05/23/2021 1057   ALKPHOS 136 (H) 05/23/2021 1057   BILITOT 0.7 05/23/2021 1057   GFRNONAA >60 05/23/2021 1057        ASSESSMENT and THERAPY PLAN:   Malignant neoplasm of overlapping sites of right breast in female, estrogen receptor positive ([place]) 06/21/2020:Patient palpated a right breast mass x 1 wk. Mammogram showed multiple confluent masses in the right breast at the 7 o'clock position measuring 2.3cm and at the 9 o'clock position measuring 2.2cm with one right axillary lymph node with cortical  thickening. Biopsy showed invasive mammary carcinoma at both positions and in the axilla, grade 2-3, HER-2 equivocal by IHC (2+), positive by FISH (ratio 3.34), ER+ >95%, PR+ 25%, Ki67 60%.   Treatment plan: 1. Neoadjuvant chemotherapy with TCH Perjeta 6 cycles completed 10/26/2020 followed by Herceptin Perjeta versus [name] for 1 year 2.  Right mastectomy [name]): IDC, grade 3, spanning 4.0cm fibrotic area, clear margins, 1/4 right axillary lymph nodes positive for carcinoma, 0.5cm. Left lumpectomy: no evidence of malignancy 3. Followed by adjuvant radiation therapy completed 02/17/2021 4.  Followed by adjuvant antiestrogen therapy started 12/29/2020 5.  Followed by neratinib ---------------------------------------------------------------------------------------------------------------------------------- Current Treatment: [name] with letrozole  Kadcyla on hold until the holidays are done.   Letrozole toxicities: Tolerating it extremely well.    Surveillance: Mammograms June 08, 2021 at [facility]: Benign Bone density: November 2022: T score -2.4: Recommended Prolia along with calcium and vitamin D.   Jessah came in to discuss her nail concerns.  I sent in penlac for her to apply to her nails and nail beds.  I also sent in a Zpak for her to take only if she gets upper respiratory infection in [place] during her travel since she has experienced recent exposures.    We will see her back in 2 months for her survivorship visit.       All questions were answered. The patient knows to call the clinic with any problems, questions or concerns. We can certainly see the patient much sooner if necessary.  Total encounter time: 30 minutes in face to face visit time, chart review, lab review, care coordination, order entry, and documentation of the encounter.   Wilber Bihari, NP 07/26/21 6:37 PM  Medical Oncology and Hematology [facility] [place], [place] [address] Tel. [phone]    Fax. [phone]  *Total Encounter Time as defined by the Centers for Medicare and Medicaid Services includes, in addition to the face-to-face time of a patient visit (documented in the note above) non-face-to-face time: obtaining and reviewing outside history, ordering and reviewing medications, tests or procedures, care coordination (communications with other health care professionals or caregivers) and documentation in the medical record.

## 2021-08-08 ENCOUNTER — Other Ambulatory Visit: Payer: Self-pay | Admitting: *Deleted

## 2021-08-08 ENCOUNTER — Encounter: Payer: Self-pay | Admitting: Hematology and Oncology

## 2021-08-08 DIAGNOSIS — M818 Other osteoporosis without current pathological fracture: Secondary | ICD-10-CM

## 2021-08-08 NOTE — Progress Notes (Signed)
Pt requesting referral be placed to Dr. Delane Ginger endocrinology for evaluation and treatment of osteoporosis.  Per MD okay for referral to be placed.  RN successfully faxed referral [phone].

## 2021-08-17 DIAGNOSIS — N952 Postmenopausal atrophic vaginitis: Secondary | ICD-10-CM | POA: Diagnosis not present

## 2021-08-17 DIAGNOSIS — N950 Postmenopausal bleeding: Secondary | ICD-10-CM | POA: Diagnosis not present

## 2021-08-22 DIAGNOSIS — S52572D Other intraarticular fracture of lower end of left radius, subsequent encounter for closed fracture with routine healing: Secondary | ICD-10-CM | POA: Diagnosis not present

## 2021-08-23 ENCOUNTER — Encounter: Payer: Self-pay | Admitting: Hematology and Oncology

## 2021-08-24 DIAGNOSIS — N950 Postmenopausal bleeding: Secondary | ICD-10-CM | POA: Diagnosis not present

## 2021-08-24 DIAGNOSIS — N952 Postmenopausal atrophic vaginitis: Secondary | ICD-10-CM | POA: Diagnosis not present

## 2021-08-30 DIAGNOSIS — M810 Age-related osteoporosis without current pathological fracture: Secondary | ICD-10-CM | POA: Diagnosis not present

## 2021-08-30 DIAGNOSIS — E559 Vitamin D deficiency, unspecified: Secondary | ICD-10-CM | POA: Insufficient documentation

## 2021-08-30 DIAGNOSIS — Z6820 Body mass index (BMI) 20.0-20.9, adult: Secondary | ICD-10-CM | POA: Diagnosis not present

## 2021-08-31 ENCOUNTER — Inpatient Hospital Stay: Payer: BC Managed Care – PPO | Admitting: Hematology and Oncology

## 2021-08-31 ENCOUNTER — Encounter: Payer: Self-pay | Admitting: Hematology and Oncology

## 2021-09-19 ENCOUNTER — Ambulatory Visit: Payer: BC Managed Care – PPO | Admitting: Hematology and Oncology

## 2021-09-20 ENCOUNTER — Telehealth: Payer: Self-pay | Admitting: Adult Health

## 2021-09-20 NOTE — Progress Notes (Signed)
? ?Patient Care Team: ?Wenda Low, MD as PCP - General (Internal Medicine) ?Rockwell Germany, RN as Oncology Nurse Navigator ?Mauro Kaufmann, RN as Oncology Nurse Navigator ?[name], MD as Consulting Physician (General Surgery) ?Nicholas Lose, MD as Consulting Physician (Hematology and Oncology) ?Kyung Rudd, MD as Consulting Physician (Radiation Oncology) ? ?DIAGNOSIS:  ?Encounter Diagnosis  ?Name Primary?  ? Malignant neoplasm of overlapping sites of right breast in female, estrogen receptor positive ([place])   ? ? ?SUMMARY OF ONCOLOGIC HISTORY: ?Oncology History  ?Malignant neoplasm of overlapping sites of right breast in female, estrogen receptor positive ([place])  ?06/21/2020 Initial Diagnosis  ? Patient palpated a right breast mass x 1 wk. Mammogram showed multiple confluent masses in the right breast at the 7 o'clock position measuring 2.3cm and at the 9 o'clock position measuring 2.2cm with one right axillary lymph node with cortical thickening. Biopsy showed invasive mammary carcinoma at both positions and in the axilla, grade 2-3, HER-2 equivocal by IHC (2+), positive by FISH (ratio 3.34), ER+ >95%, PR+ 25%, Ki67 60%. ?  ?06/29/2020 Cancer Staging  ? Staging form: Breast, AJCC 8th Edition ?- Clinical stage from 06/29/2020: Stage IB (cT2, cN1(f), cM0, G3, ER+, PR+, HER2+) - Signed by Nicholas Lose, MD on 06/29/2020 ? ?  ?07/13/2020 - 11/16/2020 Neo-Adjuvant Chemotherapy  ? Taxotere, Carbo, Herceptin, Perjeta x 6 followed by 1 cycle of herceptin/perjeta ?  ?10/28/2020 Breast MRI  ? Complete imaging response ?  ?11/14/2020 Genetic Testing  ? Negative hereditary cancer genetic testing: no pathogenic variants detected in [facility] BRCAPlus Panel and [facility] CancerNext-Expanded +RNAinsight Panel.  The report dates are Nov 14, 2020 and December 05, 2020, respectively.   ? ?The BRCAplus panel offered by [employer] and includes sequencing and deletion/duplication analysis for the following 8 genes: ATM, BRCA1, BRCA2,  CDH1, CHEK2, PALB2, PTEN, and TP53.  The CancerNext-Expanded gene panel offered by [facility] and includes sequencing, rearrangement, and RNA analysis for the following 77 genes: AIP, ALK, APC, ATM, AXIN2, BAP1, BARD1, BLM, BMPR1A, BRCA1, BRCA2, BRIP1, CDC73, CDH1, CDK4, CDKN1B, CDKN2A, CHEK2, CTNNA1, DICER1, FANCC, FH, FLCN, GALNT12, KIF1B, LZTR1, MAX, MEN1, MET, MLH1, MSH2, MSH3, MSH6, MUTYH, NBN, NF1, NF2, NTHL1, PALB2, PHOX2B, PMS2, POT1, PRKAR1A, PTCH1, PTEN, RAD51C, RAD51D, RB1, RECQL, RET, SDHA, SDHAF2, SDHB, SDHC, SDHD, SMAD4, SMARCA4, SMARCB1, SMARCE1, STK11, SUFU, TMEM127, TP53, TSC1, TSC2, VHL and XRCC2 (sequencing and deletion/duplication); EGFR, EGLN1, HOXB13, KIT, MITF, PDGFRA, POLD1, and POLE (sequencing only); EPCAM and GREM1 (deletion/duplication only).  ?  ?11/28/2020 Surgery  ? Right mastectomy [name]): IDC, grade 3, spanning 4.0cm fibrotic area, clear margins, 1/4 right axillary lymph nodes positive for carcinoma, 0.5cm.  ? ?Left lumpectomy: no evidence of malignancy ?  ?11/28/2020 Cancer Staging  ? Staging form: Breast, AJCC 8th Edition ?- Pathologic stage from 11/28/2020: No Stage Recommended (ypT2, pN1a, cM0) - Signed by Gardenia Phlegm, NP on 07/26/2021 ?Stage prefix: Post-therapy ? ?  ?12/07/2020 - 05/23/2021 Chemotherapy  ? Patient is on Treatment Plan : BREAST ADO-Trastuzumab Emtansine (Kadcyla) q21d  ?   ?01/04/2021 - 02/17/2021 Radiation Therapy  ? Adjuvant radiation ?  ?12/2020 -  Anti-estrogen oral therapy  ? Letrozole daily ?  ? ? ?CHIEF COMPLIANT: Breast cancer surveillance ? ?INTERVAL HISTORY: Teresa Sheppard is a  61 year old above-mentioned history of HER2 positive breast cancer treated with neoadjuvant chemotherapy followed by mastectomy and adjuvant Kadcyla. She presents to the clinic today for a follow-up. She tolerating the letrozole.  She is very concerned about her bone density  and the risk of osteoporosis.  She saw endocr[facility]ology at [facility] who recommended watching it until  the end of the year when she is supposed to have another bone density test.  She would like to start neratinib in July.  She will be due for a breast MRI in June. ? ? ?ALLERGIES:  is allergic to shellfish-derived products and penicillins. ? ?MEDICATIONS:  ?Current Outpatient Medications  ?Medication Sig Dispense Refill  ? acetaminophen (TYLENOL) 325 MG tablet Take 325 mg by mouth every 6 (six) hours as needed (headaches / pain).    ? azithromycin (ZITHROMAX) 250 MG tablet 2 tabs on day 1, then 1 tab daily until complete 6 each 0  ? Calcium-Vitamin D-Vitamin K ([employer] CALCIUM SOFT CHEWS) 650-12.5-40 MG-MCG-MCG CHEW Chew 1 tablet by mouth 2 (two) times daily.    ? cholecalciferol (VITAMIN D3) 25 MCG (1000 UNIT) tablet Take 1,000 Units by mouth daily.    ? ciclopirox (PENLAC) 8 % solution Apply topically at bedtime. Apply over nail and surrounding skin. Apply daily over previous coat. After seven (7) days, may remove with alcohol and continue cycle. 6.6 mL 0  ? Cranberry-Vitamin C 250-60 MG CAPS Take 1 tablet by mouth in the morning and at bedtime.    ? hydrocortisone cream 1 % Apply 1 application topically 4 (four) times daily as needed for itching.    ? letrozole (FEMARA) 2.5 MG tablet Take 1 tablet (2.5 mg total) by mouth daily. 90 tablet 3  ? loratadine (CLARITIN) 10 MG tablet Take 10 mg by mouth daily.    ? Multiple Vitamins-Minerals (HAIR/SKIN/NAILS/BIOTIN PO) Take 1 capsule by mouth daily.    ? Polyethyl Glycol-Propyl Glycol (SYSTANE OP) Place 1 drop into both eyes daily as needed (dry eyes).    ? Wound Cleansers (RADIAPLEX EX) Apply 1 application topically daily as needed (radiation).    ? ?No current facility-administered medications for this visit.  ? ?Facility-Administered Medications Ordered in Other Visits  ?Medication Dose Route Frequency Provider Last Rate Last Admin  ? sodium chloride flush (NS) 0.9 % injection 10 mL  10 mL Intracatheter Once Nicholas Lose, MD      ? sodium chloride flush (NS) 0.9 %  injection 10 mL  10 mL Intracatheter Once Nicholas Lose, MD      ? ? ?PHYSICAL EXAMINATION: ?ECOG PERFORMANCE STATUS: 1 - Symptomatic but completely ambulatory ? ?Vitals:  ? 09/21/21 1145  ?BP: 113/68  ?Pulse: (!) 53  ?Resp: 18  ?Temp: 97.7 ?F (36.5 ?C)  ?SpO2: 100%  ? ?Filed Weights  ? 09/21/21 1145  ?Weight: 145 lb (65.8 kg)  ? ? ?LABORATORY DATA:  ?I have reviewed the data as listed ? ?  Latest Ref Rng & Units 05/23/2021  ? 10:57 AM 05/02/2021  ?  9:36 AM 04/10/2021  ? 10:23 AM  ?CMP  ?Glucose 70 - 99 mg/dL 102   103   98    ?BUN 6 - 20 mg/dL _0 ?Creatinine 0.44 - 1.00 mg/dL 0.73   0.77   0.79    ?Sodium 135 - 145 mmol/L 137   141   139    ?Potassium 3.5 - 5.1 mmol/L 4.1   3.7   3.9    ?Chloride 98 - 111 mmol/L 105   107   106    ?CO2 22 - 32 mmol/L _1 ?Calcium 8.9 - 10.3 mg/dL 9.5   9.4  9.6    ?Total Protein 6.5 - 8.1 g/dL 7.8   7.6   7.4    ?Total Bilirubin 0.3 - 1.2 mg/dL 0.7   0.6   0.6    ?Alkaline Phos 38 - 126 U/L 136   134   148    ?AST 15 - 41 U/L 56   53   59    ?ALT 0 - 44 U/L 34   34   39    ? ? ?Lab Results  ?Component Value Date  ? WBC 4.9 07/04/2021  ? HGB 13.5 07/04/2021  ? HCT 39.9 07/04/2021  ? MCV 94.5 07/04/2021  ? PLT 131 (L) 07/04/2021  ? NEUTROABS 3.3 07/04/2021  ? ? ?ASSESSMENT & PLAN:  ?Malignant neoplasm of overlapping sites of right breast in female, estrogen receptor positive ([place]) ?06/21/2020:Patient palpated a right breast mass x 1 wk. Mammogram showed multiple confluent masses in the right breast at the 7 o'clock position measuring 2.3cm and at the 9 o'clock position measuring 2.2cm with one right axillary lymph node with cortical thickening. Biopsy showed invasive mammary carcinoma at both positions and in the axilla, grade 2-3, HER-2 equivocal by IHC (2+), positive by FISH (ratio 3.34), ER+ >95%, PR+ 25%, Ki67 60%. ?  ?Treatment plan: ?1. Neoadjuvant chemotherapy with [place] Perjeta ?6 cycles completed 10/26/2020 followed by Herceptin Perjeta versus Kadcyla  maintenance for 1 year completed 05/23/2021 ?2.  Right mastectomy [name]): IDC, grade 3, spanning 4.0cm fibrotic area, clear margins, 1/4 right axillary lymph nodes positive for carcinoma, 0.5cm. Left lump

## 2021-09-20 NOTE — Telephone Encounter (Signed)
Rescheduled appointment per provider PAL. Patient is aware of the changes made to her upcoming appointment. 

## 2021-09-21 ENCOUNTER — Inpatient Hospital Stay: Payer: BC Managed Care – PPO | Attending: Hematology and Oncology | Admitting: Hematology and Oncology

## 2021-09-21 ENCOUNTER — Other Ambulatory Visit: Payer: Self-pay

## 2021-09-21 DIAGNOSIS — Z170 Estrogen receptor positive status [ER+]: Secondary | ICD-10-CM | POA: Insufficient documentation

## 2021-09-21 DIAGNOSIS — Z923 Personal history of irradiation: Secondary | ICD-10-CM | POA: Insufficient documentation

## 2021-09-21 DIAGNOSIS — C50811 Malignant neoplasm of overlapping sites of right female breast: Secondary | ICD-10-CM | POA: Insufficient documentation

## 2021-09-21 DIAGNOSIS — Z79811 Long term (current) use of aromatase inhibitors: Secondary | ICD-10-CM | POA: Diagnosis not present

## 2021-09-21 DIAGNOSIS — Z9221 Personal history of antineoplastic chemotherapy: Secondary | ICD-10-CM | POA: Diagnosis not present

## 2021-09-21 NOTE — Assessment & Plan Note (Signed)
06/21/2020:Patient palpated a right breast mass x 1 wk. Mammogram showed multiple confluent masses in the right breast at the 7 o'clock position measuring 2.3cm and at the 9 o'clock position measuring 2.2cm with one right axillary lymph node with cortical thickening. Biopsy showed invasive mammary carcinoma at both positions and in the axilla, grade 2-3, HER-2 equivocal by IHC (2+), positive by FISH (ratio 3.34), ER+ >95%, PR+ 25%, Ki67 60%. ?? ?Treatment plan: ?1. Neoadjuvant chemotherapy with [place] Perjeta ?6 cycles?completed 10/26/2020?followed by Herceptin?Perjeta versus Kadcyla?maintenance for 1 year completed 05/23/2021 ?2.??Right mastectomy [name]): IDC, grade 3, spanning 4.0cm fibrotic area, clear margins, 1/4 right axillary lymph nodes positive for carcinoma, 0.5cm. Left lumpectomy: no evidence of malignancy ?3. Followed by adjuvant radiation therapy?completed 02/17/2021 ?4.??Followed by adjuvant antiestrogen therapy?started 12/29/2020 ?5.??Followed by neratinib ?---------------------------------------------------------------------------------------------------------------------------------- ?Current Treatment:letrozole  ?? ?Letrozole toxicities: Tolerating it extremely well. ??? ?Surveillance: Mammograms?June 08, 2021 at [facility]: Benign ?Bone density: November 2022: T score -2.4: Recommended Prolia along with calcium and vitamin D. ?? ?We once again discussed the role of neratinib.  She is not interested in taking it at this time ?

## 2021-09-22 ENCOUNTER — Encounter: Payer: Self-pay | Admitting: Hematology and Oncology

## 2021-09-22 ENCOUNTER — Telehealth: Payer: Self-pay | Admitting: Hematology and Oncology

## 2021-09-22 NOTE — Telephone Encounter (Signed)
Scheduled appointment per 3/30 los. Patient is aware. ?

## 2021-09-27 DIAGNOSIS — H9201 Otalgia, right ear: Secondary | ICD-10-CM | POA: Diagnosis not present

## 2021-09-28 DIAGNOSIS — N951 Menopausal and female climacteric states: Secondary | ICD-10-CM | POA: Diagnosis not present

## 2021-10-03 ENCOUNTER — Encounter (HOSPITAL_COMMUNITY): Payer: Self-pay

## 2021-10-03 ENCOUNTER — Inpatient Hospital Stay: Payer: BC Managed Care – PPO | Admitting: Adult Health

## 2021-10-03 ENCOUNTER — Encounter: Payer: Self-pay | Admitting: Hematology and Oncology

## 2021-10-19 DIAGNOSIS — D2239 Melanocytic nevi of other parts of face: Secondary | ICD-10-CM | POA: Diagnosis not present

## 2021-10-19 DIAGNOSIS — D1801 Hemangioma of skin and subcutaneous tissue: Secondary | ICD-10-CM | POA: Diagnosis not present

## 2021-10-19 DIAGNOSIS — L821 Other seborrheic keratosis: Secondary | ICD-10-CM | POA: Diagnosis not present

## 2021-10-19 DIAGNOSIS — L03011 Cellulitis of right finger: Secondary | ICD-10-CM | POA: Diagnosis not present

## 2021-10-24 ENCOUNTER — Encounter: Payer: Self-pay | Admitting: Adult Health

## 2021-10-24 ENCOUNTER — Inpatient Hospital Stay: Payer: BC Managed Care – PPO | Attending: Hematology and Oncology | Admitting: Adult Health

## 2021-10-24 DIAGNOSIS — Z806 Family history of leukemia: Secondary | ICD-10-CM | POA: Insufficient documentation

## 2021-10-24 DIAGNOSIS — D696 Thrombocytopenia, unspecified: Secondary | ICD-10-CM | POA: Insufficient documentation

## 2021-10-24 DIAGNOSIS — N390 Urinary tract infection, site not specified: Secondary | ICD-10-CM | POA: Insufficient documentation

## 2021-10-24 DIAGNOSIS — Z803 Family history of malignant neoplasm of breast: Secondary | ICD-10-CM | POA: Insufficient documentation

## 2021-10-24 DIAGNOSIS — Z79811 Long term (current) use of aromatase inhibitors: Secondary | ICD-10-CM | POA: Insufficient documentation

## 2021-10-24 DIAGNOSIS — R21 Rash and other nonspecific skin eruption: Secondary | ICD-10-CM | POA: Insufficient documentation

## 2021-10-24 DIAGNOSIS — Z923 Personal history of irradiation: Secondary | ICD-10-CM | POA: Insufficient documentation

## 2021-10-24 DIAGNOSIS — Z170 Estrogen receptor positive status [ER+]: Secondary | ICD-10-CM | POA: Diagnosis not present

## 2021-10-24 DIAGNOSIS — Z800 Family history of malignant neoplasm of digestive organs: Secondary | ICD-10-CM | POA: Insufficient documentation

## 2021-10-24 DIAGNOSIS — G620 Drug-induced polyneuropathy: Secondary | ICD-10-CM

## 2021-10-24 DIAGNOSIS — C50811 Malignant neoplasm of overlapping sites of right female breast: Secondary | ICD-10-CM | POA: Diagnosis not present

## 2021-10-24 DIAGNOSIS — T451X5A Adverse effect of antineoplastic and immunosuppressive drugs, initial encounter: Secondary | ICD-10-CM

## 2021-10-24 DIAGNOSIS — Z9221 Personal history of antineoplastic chemotherapy: Secondary | ICD-10-CM | POA: Insufficient documentation

## 2021-10-24 NOTE — Progress Notes (Signed)
SURVIVORSHIP VIRTUAL VISIT: ? ?I connected with Teresa Sheppard on 10/24/21 at  9:15 AM EDT by my chart video and verified that I am speaking with the correct person using two identifiers.  ?I discussed the limitations, risks, security and privacy concerns of performing an evaluation and management service virtually and the availability of in person appointments. I also discussed with the patient that there may be a patient responsible charge related to this service. The patient expressed understanding and agreed to proceed.  ? ?Patient location:  ?Provider location: At her home in [place], [place] ?Others participating in call: none ? ?BRIEF ONCOLOGIC HISTORY:  ?Oncology History  ?Malignant neoplasm of overlapping sites of right breast in female, estrogen receptor positive ([place])  ?06/21/2020 Initial Diagnosis  ? Patient palpated a right breast mass x 1 wk. Mammogram showed multiple confluent masses in the right breast at the 7 o'clock position measuring 2.3cm and at the 9 o'clock position measuring 2.2cm with one right axillary lymph node with cortical thickening. Biopsy showed invasive mammary carcinoma at both positions and in the axilla, grade 2-3, HER-2 equivocal by IHC (2+), positive by FISH (ratio 3.34), ER+ >95%, PR+ 25%, Ki67 60%. ?  ?06/29/2020 Cancer Staging  ? Staging form: Breast, AJCC 8th Edition ?- Clinical stage from 06/29/2020: Stage IB (cT2, cN1(f), cM0, G3, ER+, PR+, HER2+) - Signed by Nicholas Lose, MD on 06/29/2020 ? ?  ?07/13/2020 - 11/16/2020 Neo-Adjuvant Chemotherapy  ? Taxotere, Carbo, Herceptin, Perjeta x 6 followed by 1 cycle of herceptin/perjeta ?  ?10/28/2020 Breast MRI  ? Complete imaging response ?  ?11/14/2020 Genetic Testing  ? Negative hereditary cancer genetic testing: no pathogenic variants detected in [facility] BRCAPlus Panel and [facility] CancerNext-Expanded +RNAinsight Panel.  The report dates are Nov 14, 2020 and December 05, 2020, respectively.   ? ?The BRCAplus panel offered by [employer] and includes  sequencing and deletion/duplication analysis for the following 8 genes: ATM, BRCA1, BRCA2, CDH1, CHEK2, PALB2, PTEN, and TP53.  The CancerNext-Expanded gene panel offered by [facility] and includes sequencing, rearrangement, and RNA analysis for the following 77 genes: AIP, ALK, APC, ATM, AXIN2, BAP1, BARD1, BLM, BMPR1A, BRCA1, BRCA2, BRIP1, CDC73, CDH1, CDK4, CDKN1B, CDKN2A, CHEK2, CTNNA1, DICER1, FANCC, FH, FLCN, GALNT12, KIF1B, LZTR1, MAX, MEN1, MET, MLH1, MSH2, MSH3, MSH6, MUTYH, NBN, NF1, NF2, NTHL1, PALB2, PHOX2B, PMS2, POT1, PRKAR1A, PTCH1, PTEN, RAD51C, RAD51D, RB1, RECQL, RET, SDHA, SDHAF2, SDHB, SDHC, SDHD, SMAD4, SMARCA4, SMARCB1, SMARCE1, STK11, SUFU, TMEM127, TP53, TSC1, TSC2, VHL and XRCC2 (sequencing and deletion/duplication); EGFR, EGLN1, HOXB13, KIT, MITF, PDGFRA, POLD1, and POLE (sequencing only); EPCAM and GREM1 (deletion/duplication only).  ?  ?11/28/2020 Surgery  ? Right mastectomy [name]): IDC, grade 3, spanning 4.0cm fibrotic area, clear margins, 1/4 right axillary lymph nodes positive for carcinoma, 0.5cm.  ? ?Left lumpectomy: no evidence of malignancy ?  ?11/28/2020 Cancer Staging  ? Staging form: Breast, AJCC 8th Edition ?- Pathologic stage from 11/28/2020: No Stage Recommended (ypT2, pN1a, cM0) - Signed by Gardenia Phlegm, NP on 07/26/2021 ?Stage prefix: Post-therapy ? ?  ?12/07/2020 - 05/23/2021 Chemotherapy  ? Patient is on Treatment Plan : BREAST ADO-Trastuzumab Emtansine ([place]) q21d  ? ?  ?  ?01/04/2021 - 02/17/2021 Radiation Therapy  ? Site Technique Total Dose (Gy) Dose per Fx (Gy) Completed Fx Beam Energies  ?Chest Wall, Right: CW_Rt 3D 50.4/50.4 1.8 28/28 6X  ?Chest Wall, Right: CW_Rt_SCLV 3D 50.4/50.4 1.8 28/28 6X, 10X  ?Chest Wall, Right: CW_Rt_Bst Electron 10/10 2 5/5 6E  ? ?  ?12/2020 -  Anti-estrogen oral therapy  ? Letrozole daily ?  ? ? ?INTERVAL HISTORY:  ?Teresa Sheppard to review her survivorship care plan detailing her treatment course for breast cancer, as well as  monitoring long-term side effects of that treatment, education regarding health maintenance, screening, and overall wellness and health promotion.    ? ?Overall, Teresa Sheppard reports feeling quite well.  She is taking Letrozole daily and is tolerating it moderately well.  She occasionally misses a pill (max of 1 per month).  She is experiencing vaginal dryness and is undergoing Josph Macho Touch to help alleviate this. She has borderline osteopenia/osteoporosis.  She saw endocrinology at [facility].  She was given the clear to delay Prolia, and will undergo retest in December, and would reassess at that point.   ? ?She has MRI breast scheduled on 11/14/2021.  She informed me she has some anxiety leading up to this  ? ?REVIEW OF SYSTEMS:  ?Review of Systems  ?Constitutional:  Negative for appetite change, chills, fatigue, fever and unexpected weight change.  ?HENT:   Negative for hearing loss, lump/mass and trouble swallowing.   ?Eyes:  Negative for eye problems and icterus.  ?Respiratory:  Negative for chest tightness, cough and shortness of breath.   ?Cardiovascular:  Negative for chest pain, leg swelling and palpitations.  ?Gastrointestinal:  Negative for abdominal distention, abdominal pain, constipation, diarrhea, nausea and vomiting.  ?Endocrine: Negative for hot flashes.  ?Genitourinary:  Negative for difficulty urinating.   ?Musculoskeletal:  Negative for arthralgias.  ?Skin:  Negative for itching and rash.  ?Neurological:  Negative for dizziness, extremity weakness, headaches and numbness.  ?Hematological:  Negative for adenopathy. Does not bruise/bleed easily.  ?Psychiatric/Behavioral:  Negative for depression. The patient is not nervous/anxious.   ?Breast: Denies any new nodularity, masses, tenderness, nipple changes, or nipple discharge.  ? ? ? ? ?ONCOLOGY TREATMENT TEAM:  ?1. Surgeon:  Dr. [name] at [facility] ?2. Medical Oncologist: [name]  ?3. Radiation Oncologist: Dr. Lisbeth Renshaw ?  ? ?PAST  MEDICAL/SURGICAL HISTORY:  ?Past Medical History:  ?Diagnosis Date  ? Anxiety   ? Breast cancer in female [facility])   ? Right  ? Family history of breast cancer 11/07/2020  ? Family history of pancreatic cancer 11/07/2020  ? History of kidney stones 2014-2015  ? Hyperthyroidism   ? No longer an issue  ? PONV (postoperative nausea and vomiting)   ? Thyromegaly   ? ?Past Surgical History:  ?Procedure Laterality Date  ? ABDOMINAL WALL DEFECT REPAIR    ? following hernia repair in 2010  ? APPENDECTOMY    ? BOWEL RESECTION    ? 2008  while pregnant with last child. Scar tissue from appendectomy wrapped around bowel and was snipped to free bowel.   ? CESAREAN SECTION    ? DILATION AND CURETTAGE OF UTERUS    ? 2000 and 2014  ? HERNIA REPAIR    ? MASTECTOMY W/ SENTINEL NODE BIOPSY Right 11/28/2020  ? Procedure: RIGHT MASTECTOMY WITH RIGHT AXILLARY SENTINEL LYMPH NODE BIOPSY;  Surgeon: [name], MD;  Location: [place];  Service: General;  Laterality: Right;  ? PORT-A-CATH REMOVAL Left 01/30/2021  ? Procedure: PORT REMOVAL;  Surgeon: [name], MD;  Location: [place];  Service: General;  Laterality: Left;  LOCAL  ? PORTACATH PLACEMENT Left 07/12/2020  ? Procedure: INSERTION PORT-A-CATH WITH ULTRASOUND GUIDANCE;  Surgeon: [name], MD;  Location: [place];  Service: General;  Laterality: Left;  ? RADIOACTIVE SEED GUIDED AXILLARY SENTINEL LYMPH NODE  Right 11/28/2020  ? Procedure: RADIOACTIVE SEED GUIDED RIGHT AXILLARY AXILLARY SENTINEL LYMPH NODE EXCISION;  Surgeon: [name], MD;  Location: [place];  Service: General;  Laterality: Right;  ? RADIOACTIVE SEED GUIDED EXCISIONAL BREAST BIOPSY Left 11/28/2020  ? Procedure: RADIOACTIVE SEED GUIDED LEFT EXCISIONAL BREAST BIOPSY;  Surgeon: [name], MD;  Location: [place];  Service: General;  Laterality: Left;  ? ROOT CANAL    ? 2019  ? TONSILLECTOMY  1969  ? URETERAL EXPLORATION    ? dilataion for congenitally small ureter  ? ? ? ?ALLERGIES:  ?Allergies   ?Allergen Reactions  ? Shellfish-Derived Products Anaphylaxis  ?  Per patient whenever she consumes a lot gives tightness of throat  ? Penicillins Rash  ? ? ? ?CURRENT MEDICATIONS:  ?Outpatient Encounter Medicatio

## 2021-10-24 NOTE — Addendum Note (Signed)
Addended by: Rea College D on: 10/24/2021 02:12 PM ? ? Modules accepted: Orders ? ?

## 2021-10-25 ENCOUNTER — Encounter: Payer: Self-pay | Admitting: Hematology and Oncology

## 2021-10-25 ENCOUNTER — Other Ambulatory Visit: Payer: Self-pay | Admitting: Gastroenterology

## 2021-10-25 DIAGNOSIS — Z01818 Encounter for other preprocedural examination: Secondary | ICD-10-CM | POA: Diagnosis not present

## 2021-10-25 DIAGNOSIS — Z800 Family history of malignant neoplasm of digestive organs: Secondary | ICD-10-CM

## 2021-10-30 ENCOUNTER — Other Ambulatory Visit: Payer: Self-pay | Admitting: *Deleted

## 2021-10-30 DIAGNOSIS — Z170 Estrogen receptor positive status [ER+]: Secondary | ICD-10-CM

## 2021-10-30 NOTE — Therapy (Signed)
?OUTPATIENT PHYSICAL THERAPY FEMALE PELVIC EVALUATION ? ? ?Patient Name: Teresa Sheppard ?MRN: [id number] ?DOB:January 30, 1961, 61 y.o., female ?Today's Date: 10/31/2021 ? ? PT End of Session - 10/31/21 1229   ? ? Visit Number 1   ? Date for PT Re-Evaluation 01/23/22   ? Authorization Type BCBS   ? PT Start Time [phone]   ? PT Stop Time 1011   ? PT Time Calculation (min) 37 min   ? Activity Tolerance Patient tolerated treatment well   ? Behavior During Therapy [facility] for tasks assessed/performed   ? ?  ?  ? ?  ? ? ?Past Medical History:  ?Diagnosis Date  ? Anxiety   ? Breast cancer in female [facility])   ? Right  ? Family history of breast cancer 11/07/2020  ? Family history of pancreatic cancer 11/07/2020  ? History of kidney stones 2014-2015  ? Hyperthyroidism   ? No longer an issue  ? PONV (postoperative nausea and vomiting)   ? Thyromegaly   ? ?Past Surgical History:  ?Procedure Laterality Date  ? ABDOMINAL WALL DEFECT REPAIR    ? following hernia repair in 2010  ? APPENDECTOMY    ? BOWEL RESECTION    ? 2008  while pregnant with last child. Scar tissue from appendectomy wrapped around bowel and was snipped to free bowel.   ? CESAREAN SECTION    ? DILATION AND CURETTAGE OF UTERUS    ? 2000 and 2014  ? HERNIA REPAIR    ? MASTECTOMY W/ SENTINEL NODE BIOPSY Right 11/28/2020  ? Procedure: RIGHT MASTECTOMY WITH RIGHT AXILLARY SENTINEL LYMPH NODE BIOPSY;  Surgeon: [name], MD;  Location: [place];  Service: General;  Laterality: Right;  ? PORT-A-CATH REMOVAL Left 01/30/2021  ? Procedure: PORT REMOVAL;  Surgeon: [name], MD;  Location: [place];  Service: General;  Laterality: Left;  LOCAL  ? PORTACATH PLACEMENT Left 07/12/2020  ? Procedure: INSERTION PORT-A-CATH WITH ULTRASOUND GUIDANCE;  Surgeon: [name], MD;  Location: [place];  Service: General;  Laterality: Left;  ? RADIOACTIVE SEED GUIDED AXILLARY SENTINEL LYMPH NODE Right 11/28/2020  ? Procedure: RADIOACTIVE SEED GUIDED RIGHT AXILLARY AXILLARY SENTINEL LYMPH NODE  EXCISION;  Surgeon: [name], MD;  Location: [place];  Service: General;  Laterality: Right;  ? RADIOACTIVE SEED GUIDED EXCISIONAL BREAST BIOPSY Left 11/28/2020  ? Procedure: RADIOACTIVE SEED GUIDED LEFT EXCISIONAL BREAST BIOPSY;  Surgeon: [name], MD;  Location: [place];  Service: General;  Laterality: Left;  ? ROOT CANAL    ? 2019  ? TONSILLECTOMY  1969  ? URETERAL EXPLORATION    ? dilataion for congenitally small ureter  ? ?Patient Active Problem List  ? Diagnosis Date Noted  ? 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 Vitamin D deficiency 08/30/2021  ? Osteoporosis 08/30/2021  ? 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 Genetic testing 11/15/2020  ? Family history of breast cancer 11/07/2020  ? Family history of pancreatic cancer 11/07/2020  ? Dysphagia 10/26/2020  ? Malignant neoplasm of overlapping sites of right breast in female, estrogen receptor positive ([place]) 06/21/2020  ? Hyperthyroidism 03/28/2016  ? ? ?PCP: Wenda Low, MD ? ?REFERRING PROVIDER: Nicholas Lose, MD ? ?REFERRING DIAG: C50.811,Z17.0 (ICD-10-CM) - Malignant neoplasm of overlapping sites of right breast in female, estrogen receptor positive ([place]) ? ?THERAPY DIAG:  ?Muscle weakness (generalized) ? ?Other muscle spasm ? ?Malignant neoplasm of overlapping sites of right breast in female, estrogen receptor positive ([place]) ? ?ONSET DATE: some issues for 12 years ? ?SUBJECTIVE:                                                                                                                                                                                          ? ?  SUBJECTIVE STATEMENT: ?It feels different around the time of taking medicine for estrogen.  I feel some irritation right now.  Nocturia is 4-6x.  ?Fluid intake: a lot of water ? ?Patient confirms identification and approves PT to assess pelvic floor and treatment Yes ? ? ?PAIN:  ?Are you having pain? Yes ?NPRS scale: 2-3/10 ?Pain location: Vaginal  (feels like external) ? ?Pain type: irritation ?Pain description: intermittent  ? ?Aggravating factors: caffeine or dehydration ?Relieving factors: lubrication "replense" and hydration ? ?PRECAUTIONS: None ? ?WEIGHT BEARING RESTRICTIONS No ? ?FALLS:  ?Has patient fallen in last 6 months? Yes. Number of falls 1 - tripped over a foot rest that was blocking a doorway ? ?LIVING ENVIRONMENT: ?Lives with: lives with their spouse, lives with their son, and lives with their daughter ?Lives in: House/apartment ? ? 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 ?PLOF: Independent ? ?PATIENT GOALS get more sleep and less irritation ? ?PERTINENT HISTORY:  ?Hernia repair 2010, appendectomy, bowel resection, c-section, breast cancer, chronic UTI ?Sexual abuse: No ? ?BOWEL MOVEMENT ?Pain with bowel movement: No ?Type of bowel movement:Frequency regularly  and Strain No ? ? ?URINATION ?Pain with urination: Yes from dryness stinging ?Fully empty bladder: No ?Stream: Weak ?Urgency: Yes: more at night ?Frequency: 4-6 nocturia ?Leakage:  No ?Pads: No ? ?INTERCOURSE ?Pain with intercourse: Initial Penetration, During Penetration, and unable currently ?Ability to have vaginal penetration:  No ? ?Marinoff Scale: 3/3 ? ?PREGNANCY ?Vaginal deliveries 3 (46 at delivery of youngest) ?Tearing No ?C-section deliveries 1 ?Currently pregnant No ? ?PROLAPSE ?Uterine has been seen on [place] and maybe causing bladder retention ? ? ? ?OBJECTIVE:  ? ? ?PATIENT SURVEYS:  ? ? ?PFIQ-7 POPIQ = 19 ? ?COGNITION: ? Overall cognitive status: Within functional limits for tasks assessed   ?  ? ? ?MUSCLE LENGTH: ?Hamstrings: Right 80 deg; Left 70 deg ? ? ?LUMBAR SPECIAL TESTS:  ?ASLR - harder on left but a little easier with compression ? ?FUNCTIONAL TESTS:  ? ?SLS trendelenburg Rt side; unsteady bil ?GAIT: ? ?Comments: WFL ? ?POSTURE:  ?Rt hip elevated in standing ? ?LUMBARAROM/PROM ? ?A/PROM A/PROM  ?10/31/2021   ?Flexion 75%  ?Extension   ?Right lateral flexion   ?Left lateral flexion   ?Right rotation   ?Left rotation   ? (Blank rows = not tested) ? ?LE ROM: ? ?Passive ROM Right ?10/31/2021 Left ?10/31/2021  ?Hip flexion 80% 90%  ?Hip extension    ?Hip abduction    ?Hip adduction    ?Hip internal rotation 50% 75%  ?Hip external rotation 50% 75%  ?Knee flexion    ?Knee extension    ?Ankle dorsiflexion    ?Ankle plantarflexion    ?Ankle inversion    ?Ankle eversion    ? (Blank rows = not tested) ? ?LE MMT: ? ?MMT Right ?10/31/2021 Left ?10/31/2021  ?Hip flexion    ?Hip extension    ?Hip abduction    ?Hip adduction    ?Hip internal rotation    ?Hip external rotation    ?Knee flexion    ?Knee extension    ?Ankle dorsiflexion    ?Ankle plantarflexion    ?Ankle inversion    ?Ankle eversion    ? ?PELVIC MMT: ?  ?MMT  ?10/31/2021  ?Vaginal 2/5 (weak squeeze, definite lift)  ?Internal Anal Sphincter   ?External Anal Sphincter   ?Puborectalis   ?Diastasis Recti   ?(Blank rows = not tested) ? ?  PALPATION: ?  General  tension on the Rt flank and slight side bend in standing ? ?              External Perineal Exam descending perineal body ?              ?              Internal Pelvic Floor decreased closure and TTP Lt side more than Rt; difficulty holding contraction and fatigues quickly with reps; stiff muscles ? ?TONE: ?high ? ?PROLAPSE: ?None observed - did not do bearing down ? ?TODAY'S TREATMENT  ?EVAL info on vaginal moisturizer and sampel of v-magic ? ? ?PATIENT EDUCATION:  ?Education details: sample given ?Person educated: Patient ?Education method: Explanation ?Education comprehension: verbalized understanding ? ? ?HOME EXERCISE PROGRAM: ? ? ?ASSESSMENT: ? ?CLINICAL IMPRESSION: ?Patient is a 61 y.o. female who was seen today for physical therapy evaluation and treatment for pelvic pain and urinary frequency. Pt has weakness and tension of pelvic floor muscles and surrounding tissues.  Pt fatigues quickly with pelvic floor  contractions. Pt also has posture abnormalities as mentioned above.  She has decreased PROM in bil hips as mentioned above.  Pt will benefit from skilled PT to address poture and pelvic floor strength and soft tissu

## 2021-10-31 ENCOUNTER — Encounter: Payer: Self-pay | Admitting: Physical Therapy

## 2021-10-31 ENCOUNTER — Ambulatory Visit: Payer: BC Managed Care – PPO | Attending: Hematology and Oncology | Admitting: Physical Therapy

## 2021-10-31 DIAGNOSIS — M62838 Other muscle spasm: Secondary | ICD-10-CM | POA: Insufficient documentation

## 2021-10-31 DIAGNOSIS — C50811 Malignant neoplasm of overlapping sites of right female breast: Secondary | ICD-10-CM | POA: Insufficient documentation

## 2021-10-31 DIAGNOSIS — Z5189 Encounter for other specified aftercare: Secondary | ICD-10-CM | POA: Diagnosis not present

## 2021-10-31 DIAGNOSIS — M6281 Muscle weakness (generalized): Secondary | ICD-10-CM | POA: Insufficient documentation

## 2021-10-31 DIAGNOSIS — Z170 Estrogen receptor positive status [ER+]: Secondary | ICD-10-CM | POA: Insufficient documentation

## 2021-11-02 DIAGNOSIS — N3000 Acute cystitis without hematuria: Secondary | ICD-10-CM | POA: Diagnosis not present

## 2021-11-02 DIAGNOSIS — Z23 Encounter for immunization: Secondary | ICD-10-CM | POA: Diagnosis not present

## 2021-11-02 DIAGNOSIS — E049 Nontoxic goiter, unspecified: Secondary | ICD-10-CM | POA: Diagnosis not present

## 2021-11-02 DIAGNOSIS — R7309 Other abnormal glucose: Secondary | ICD-10-CM | POA: Diagnosis not present

## 2021-11-02 DIAGNOSIS — E559 Vitamin D deficiency, unspecified: Secondary | ICD-10-CM | POA: Diagnosis not present

## 2021-11-02 DIAGNOSIS — Z0000 Encounter for general adult medical examination without abnormal findings: Secondary | ICD-10-CM | POA: Diagnosis not present

## 2021-11-02 DIAGNOSIS — E785 Hyperlipidemia, unspecified: Secondary | ICD-10-CM | POA: Diagnosis not present

## 2021-11-03 ENCOUNTER — Inpatient Hospital Stay (HOSPITAL_BASED_OUTPATIENT_CLINIC_OR_DEPARTMENT_OTHER): Payer: BC Managed Care – PPO | Admitting: Adult Health

## 2021-11-03 DIAGNOSIS — C50811 Malignant neoplasm of overlapping sites of right female breast: Secondary | ICD-10-CM

## 2021-11-03 DIAGNOSIS — Z170 Estrogen receptor positive status [ER+]: Secondary | ICD-10-CM

## 2021-11-03 NOTE — Progress Notes (Signed)
Preslar I talked this morning she just wanted to follow-up with me on the MRI and who would be reading her MRI on Nov 15, 2021.  I let her know that I had sent an email to [facility] and had not yet heard back and in review I did send the email on May 2.  I resent another email to [place] at [place].  I let Nakaya know that I would reach out to her once I hear back. ? ?The extent of our discussion does not constitute as a telephone office visit it was less than 5 minutes in duration and brief.  This is no charge visit. ? ?Wilber Bihari, NP 11/03/21 8:36 AM ?Medical Oncology and Hematology ?[place] ?[address] ?[place], [place] [address] ?Tel. [phone]    Fax. [phone] ? ?

## 2021-11-06 ENCOUNTER — Telehealth: Payer: Self-pay

## 2021-11-06 NOTE — Telephone Encounter (Signed)
Return call to pt, advised pt that per [facility] they will do their best to accommodate requests for radiologist if she knows who she prefers.  Pt also reports some elevated lab levels (AST "10 or less over normal ranges"), patient states she would like to have [place] review the changes next time she sees [place].   ?

## 2021-11-08 ENCOUNTER — Inpatient Hospital Stay: Payer: BC Managed Care – PPO

## 2021-11-08 ENCOUNTER — Inpatient Hospital Stay (HOSPITAL_BASED_OUTPATIENT_CLINIC_OR_DEPARTMENT_OTHER): Payer: BC Managed Care – PPO | Admitting: Physician Assistant

## 2021-11-08 ENCOUNTER — Encounter: Payer: Self-pay | Admitting: Adult Health

## 2021-11-08 ENCOUNTER — Other Ambulatory Visit: Payer: Self-pay

## 2021-11-08 ENCOUNTER — Encounter: Payer: Self-pay | Admitting: Hematology and Oncology

## 2021-11-08 VITALS — BP 109/69 | HR 63 | Temp 98.1°F | Resp 16 | Wt 146.1 lb

## 2021-11-08 DIAGNOSIS — Z170 Estrogen receptor positive status [ER+]: Secondary | ICD-10-CM | POA: Diagnosis not present

## 2021-11-08 DIAGNOSIS — Z803 Family history of malignant neoplasm of breast: Secondary | ICD-10-CM | POA: Diagnosis not present

## 2021-11-08 DIAGNOSIS — D696 Thrombocytopenia, unspecified: Secondary | ICD-10-CM

## 2021-11-08 DIAGNOSIS — Z800 Family history of malignant neoplasm of digestive organs: Secondary | ICD-10-CM | POA: Diagnosis not present

## 2021-11-08 DIAGNOSIS — C50811 Malignant neoplasm of overlapping sites of right female breast: Secondary | ICD-10-CM

## 2021-11-08 DIAGNOSIS — Z9221 Personal history of antineoplastic chemotherapy: Secondary | ICD-10-CM | POA: Diagnosis not present

## 2021-11-08 DIAGNOSIS — Z806 Family history of leukemia: Secondary | ICD-10-CM | POA: Diagnosis not present

## 2021-11-08 DIAGNOSIS — N390 Urinary tract infection, site not specified: Secondary | ICD-10-CM | POA: Diagnosis not present

## 2021-11-08 DIAGNOSIS — R21 Rash and other nonspecific skin eruption: Secondary | ICD-10-CM | POA: Diagnosis not present

## 2021-11-08 DIAGNOSIS — Z79811 Long term (current) use of aromatase inhibitors: Secondary | ICD-10-CM | POA: Diagnosis not present

## 2021-11-08 DIAGNOSIS — Z923 Personal history of irradiation: Secondary | ICD-10-CM | POA: Diagnosis not present

## 2021-11-08 LAB — CBC WITH DIFFERENTIAL/PLATELET
Abs Immature Granulocytes: 0.01 10*3/uL (ref 0.00–0.07)
Basophils Absolute: 0 10*3/uL (ref 0.0–0.1)
Basophils Relative: 1 %
Eosinophils Absolute: 0.5 10*3/uL (ref 0.0–0.5)
Eosinophils Relative: 11 %
HCT: 40.9 % (ref 36.0–46.0)
Hemoglobin: 13.6 g/dL (ref 12.0–15.0)
Immature Granulocytes: 0 %
Lymphocytes Relative: 9 %
Lymphs Abs: 0.4 10*3/uL — ABNORMAL LOW (ref 0.7–4.0)
MCH: 31.6 pg (ref 26.0–34.0)
MCHC: 33.3 g/dL (ref 30.0–36.0)
MCV: 95.1 fL (ref 80.0–100.0)
Monocytes Absolute: 0.4 10*3/uL (ref 0.1–1.0)
Monocytes Relative: 10 %
Neutro Abs: 3 10*3/uL (ref 1.7–7.7)
Neutrophils Relative %: 69 %
Platelets: 132 10*3/uL — ABNORMAL LOW (ref 150–400)
RBC: 4.3 MIL/uL (ref 3.87–5.11)
RDW: 12.2 % (ref 11.5–15.5)
WBC: 4.3 10*3/uL (ref 4.0–10.5)
nRBC: 0 % (ref 0.0–0.2)

## 2021-11-08 LAB — URINALYSIS, COMPLETE (UACMP) WITH MICROSCOPIC
Bilirubin Urine: NEGATIVE
Glucose, UA: NEGATIVE mg/dL
Hgb urine dipstick: NEGATIVE
Ketones, ur: NEGATIVE mg/dL
Nitrite: NEGATIVE
Protein, ur: NEGATIVE mg/dL
Specific Gravity, Urine: 1.003 — ABNORMAL LOW (ref 1.005–1.030)
pH: 7 (ref 5.0–8.0)

## 2021-11-08 LAB — CMP (CANCER CENTER ONLY)
ALT: 55 U/L — ABNORMAL HIGH (ref 0–44)
AST: 63 U/L — ABNORMAL HIGH (ref 15–41)
Albumin: 4.2 g/dL (ref 3.5–5.0)
Alkaline Phosphatase: 187 U/L — ABNORMAL HIGH (ref 38–126)
Anion gap: 7 (ref 5–15)
BUN: 12 mg/dL (ref 6–20)
CO2: 28 mmol/L (ref 22–32)
Calcium: 9.7 mg/dL (ref 8.9–10.3)
Chloride: 105 mmol/L (ref 98–111)
Creatinine: 0.76 mg/dL (ref 0.44–1.00)
GFR, Estimated: >60 mL/min (ref >60)
Glucose, Bld: 87 mg/dL (ref 70–99)
Potassium: 3.9 mmol/L (ref 3.5–5.1)
Sodium: 140 mmol/L (ref 135–145)
Total Bilirubin: 0.6 mg/dL (ref 0.3–1.2)
Total Protein: 7.8 g/dL (ref 6.5–8.1)

## 2021-11-08 MED ORDER — FOSFOMYCIN TROMETHAMINE 3 G PO PACK
3.0000 g | PACK | Freq: Once | ORAL | 0 refills | Status: AC
Start: 2021-11-08 — End: 2021-11-08

## 2021-11-08 NOTE — Progress Notes (Signed)
? ? ? ?Symptom Management Consult note ?[place]   ? ?Patient Care Team: ?Wenda Low, MD as PCP - General (Internal Medicine) ?[name], MD as Consulting Physician (General Surgery) ?Nicholas Lose, MD as Consulting Physician (Hematology and Oncology) ?Kyung Rudd, MD as Consulting Physician (Radiation Oncology) ?Jacelyn Pi, MD as Referring Physician (Endocrinology) ?Lona Millard, MD as Referring Physician (Endocrinology) ?Arta Silence, MD as Consulting Physician (Gastroenterology)  ? ? ?Name of the patient: Teresa Sheppard  [id number]  Nov 17, 1960  ? ?Date of visit: 11/08/2021  ? ? ?Chief complaint/ Reason for visit- urinary frequency and dysuria ? ?Oncology History  ?Malignant neoplasm of overlapping sites of right breast in female, estrogen receptor positive ([place])  ?06/21/2020 Initial Diagnosis  ? Patient palpated a right breast mass x 1 wk. Mammogram showed multiple confluent masses in the right breast at the 7 o'clock position measuring 2.3cm and at the 9 o'clock position measuring 2.2cm with one right axillary lymph node with cortical thickening. Biopsy showed invasive mammary carcinoma at both positions and in the axilla, grade 2-3, HER-2 equivocal by IHC (2+), positive by FISH (ratio 3.34), ER+ >95%, PR+ 25%, Ki67 60%. ?  ?06/29/2020 Cancer Staging  ? Staging form: Breast, AJCC 8th Edition ?- Clinical stage from 06/29/2020: Stage IB (cT2, cN1(f), cM0, G3, ER+, PR+, HER2+) - Signed by Nicholas Lose, MD on 06/29/2020 ? ?  ?07/13/2020 - 11/16/2020 Neo-Adjuvant Chemotherapy  ? Taxotere, Carbo, Herceptin, Perjeta x 6 followed by 1 cycle of herceptin/perjeta ?  ?10/28/2020 Breast MRI  ? Complete imaging response ?  ?11/14/2020 Genetic Testing  ? Negative hereditary cancer genetic testing: no pathogenic variants detected in [facility] BRCAPlus Panel and [facility] CancerNext-Expanded +RNAinsight Panel.  The report dates are Nov 14, 2020 and December 05, 2020, respectively.   ? ?The BRCAplus panel  offered by [employer] and includes sequencing and deletion/duplication analysis for the following 8 genes: ATM, BRCA1, BRCA2, CDH1, CHEK2, PALB2, PTEN, and TP53.  The CancerNext-Expanded gene panel offered by [facility] and includes sequencing, rearrangement, and RNA analysis for the following 77 genes: AIP, ALK, APC, ATM, AXIN2, BAP1, BARD1, BLM, BMPR1A, BRCA1, BRCA2, BRIP1, CDC73, CDH1, CDK4, CDKN1B, CDKN2A, CHEK2, CTNNA1, DICER1, FANCC, FH, FLCN, GALNT12, KIF1B, LZTR1, MAX, MEN1, MET, MLH1, MSH2, MSH3, MSH6, MUTYH, NBN, NF1, NF2, NTHL1, PALB2, PHOX2B, PMS2, POT1, PRKAR1A, PTCH1, PTEN, RAD51C, RAD51D, RB1, RECQL, RET, SDHA, SDHAF2, SDHB, SDHC, SDHD, SMAD4, SMARCA4, SMARCB1, SMARCE1, STK11, SUFU, TMEM127, TP53, TSC1, TSC2, VHL and XRCC2 (sequencing and deletion/duplication); EGFR, EGLN1, HOXB13, KIT, MITF, PDGFRA, POLD1, and POLE (sequencing only); EPCAM and GREM1 (deletion/duplication only).  ?  ?11/28/2020 Surgery  ? Right mastectomy [name]): IDC, grade 3, spanning 4.0cm fibrotic area, clear margins, 1/4 right axillary lymph nodes positive for carcinoma, 0.5cm.  ? ?Left lumpectomy: no evidence of malignancy ?  ?11/28/2020 Cancer Staging  ? Staging form: Breast, AJCC 8th Edition ?- Pathologic stage from 11/28/2020: No Stage Recommended (ypT2, pN1a, cM0) - Signed by Gardenia Phlegm, NP on 07/26/2021 ?Stage prefix: Post-therapy ? ?  ?12/07/2020 - 05/23/2021 Chemotherapy  ? Patient is on Treatment Plan : BREAST ADO-Trastuzumab Emtansine (Kadcyla) q21d  ? ?   ?01/04/2021 - 02/17/2021 Radiation Therapy  ? Site Technique Total Dose (Gy) Dose per Fx (Gy) Completed Fx Beam Energies  ?Chest Wall, Right: CW_Rt 3D 50.4/50.4 1.8 28/28 6X  ?Chest Wall, Right: CW_Rt_SCLV 3D 50.4/50.4 1.8 28/28 6X, 10X  ?Chest Wall, Right: CW_Rt_Bst Electron 10/10 2 5/5 6E  ? ?  ?12/2020 -  Anti-estrogen  oral therapy  ? Letrozole daily ?  ? ? ?Current Therapy: Letrozole daily ? ?Interval history- Teresa Sheppard. Teresa Sheppard is a 61 yo female with  oncologic history as above presenting to [facility] today with chief complaint of dysuria and urinary frequency x6 days.  Patient had a physical last Thursday and was found to have UTI as well as thrombocytopenia with platelet count of 126.  She was prescribed Macrobid and started taking it x4 days ago.  She states initially her symptoms were improving however yesterday thy worsened. She is unsure if she has had any vaginal discharge as she has been using Replens vaginal gel and it "leaks out." She denies any thick white discharge or vaginal odor. Denies any associated abdominal pain, nausea, vomiting, back or flank pain, hematuria. Patient states last night she noticed a rash on her shins. She spoke with a friend who is a Marine scientist and they told her the rash was petechiae. She denies any leg pain. She does admit to increased working out over the last week and has been doing bodyweight squats as well as using 10 pounds weights. She denies any fall or injury. Denies any bleeding from gums or other spontaneous bleeding. She denies any fever, chills, chest pain, shortness of breath. ? ? ? ?ROS  ?All other systems are reviewed and are negative for acute change except as noted in the HPI. ? ? ? ?Allergies  ?Allergen Reactions  ?? Shellfish-Derived Products Anaphylaxis  ?  Per patient whenever she consumes a lot gives tightness of throat  ?? Oxycodone Hcl   ?? Shrimp Extract Allergy Skin Test   ?? Hydrocodone Anxiety and Nausea Only  ?? Penicillins Rash  ? ? ? ?Past Medical History:  ?Diagnosis Date  ?? Anxiety   ?? Breast cancer in female [facility])   ? Right  ?? Family history of breast cancer 11/07/2020  ?? Family history of pancreatic cancer 11/07/2020  ?? History of kidney stones 2014-2015  ?? Hyperthyroidism   ? No longer an issue  ?? PONV (postoperative nausea and vomiting)   ?? Thyromegaly   ? ? ? ?Past Surgical History:  ?Procedure Laterality Date  ?? ABDOMINAL WALL DEFECT REPAIR    ? following hernia repair in 2010  ??  APPENDECTOMY    ?? BOWEL RESECTION    ? 2008  while pregnant with last child. Scar tissue from appendectomy wrapped around bowel and was snipped to free bowel.   ?? CESAREAN SECTION    ?? DILATION AND CURETTAGE OF UTERUS    ? 2000 and 2014  ?? HERNIA REPAIR    ?? MASTECTOMY W/ SENTINEL NODE BIOPSY Right 11/28/2020  ? Procedure: RIGHT MASTECTOMY WITH RIGHT AXILLARY SENTINEL LYMPH NODE BIOPSY;  Surgeon: [name], MD;  Location: [place];  Service: General;  Laterality: Right;  ?? PORT-A-CATH REMOVAL Left 01/30/2021  ? Procedure: PORT REMOVAL;  Surgeon: [name], MD;  Location: [place];  Service: General;  Laterality: Left;  LOCAL  ?? PORTACATH PLACEMENT Left 07/12/2020  ? Procedure: INSERTION PORT-A-CATH WITH ULTRASOUND GUIDANCE;  Surgeon: [name], MD;  Location: [place];  Service: General;  Laterality: Left;  ?? RADIOACTIVE SEED GUIDED AXILLARY SENTINEL LYMPH NODE Right 11/28/2020  ? Procedure: RADIOACTIVE SEED GUIDED RIGHT AXILLARY AXILLARY SENTINEL LYMPH NODE EXCISION;  Surgeon: [name], MD;  Location: [place];  Service: General;  Laterality: Right;  ?? RADIOACTIVE SEED GUIDED EXCISIONAL BREAST BIOPSY Left 11/28/2020  ? Procedure: RADIOACTIVE SEED GUIDED LEFT EXCISIONAL BREAST BIOPSY;  Surgeon: [name],  Rodman Key, MD;  Location: [place];  Service: General;  Laterality: Left;  ?? ROOT CANAL    ? 2019  ?? TONSILLECTOMY  1969  ?? URETERAL EXPLORATION    ? dilataion for congenitally small ureter  ? ? ?Social History  ? ?Socioeconomic History  ?? Marital status: Married  ?  Spouse name: Not on file  ?? Number of children: Not on file  ?? Years of education: Not on file  ?? Highest education level: Not on file  ?Occupational History  ?? Not on file  ?Tobacco Use  ?? Smoking status: Never  ?? Smokeless tobacco: Never  ?Vaping Use  ?? Vaping Use: Never used  ?Substance and Sexual Activity  ?? Alcohol use: Yes  ?  Comment: occas  ?? Drug use: Never  ?? Sexual activity: Not Currently  ?Other Topics  Concern  ?? Not on file  ?Social History Narrative  ?? Not on file  ? ?Social Determinants of Health  ? ?Financial Resource Strain: Not on file  ?Food Insecurity: Not on file  ?Transportation Needs: Not on file  ?Physi

## 2021-11-09 ENCOUNTER — Encounter: Payer: Self-pay | Admitting: Physician Assistant

## 2021-11-09 IMAGING — MG MM PLC BREAST LOC DEV 1ST LESION INC MAMMO GUIDE*L*
8 series · 8 of 8 positions shown · non-contrast
Comparison: Previous exam(s).

CLINICAL DATA: 59-year-old with multicentric RIGHT breast cancer
metastatic to a RIGHT axillary lymph node for which the patient
underwent neoadjuvant chemotherapy with a complete imaging response
to treatment. She is planning on having RIGHT mastectomy and
targeted RIGHT axillary node removal and sentinel node biopsy. She
also has a biopsy proven possible papillary lesion involving the
LOWER subareolar LEFT breast for which she is having an excisional
biopsy. Radioactive seed localization is performed in anticipation
of her surgeries which are scheduled on 11/28/2020.

EXAM:
MAMMOGRAPHIC GUIDED RADIOACTIVE SEED LOCALIZATION OF THE LEFT BREAST

[L LM (1 of 4)]
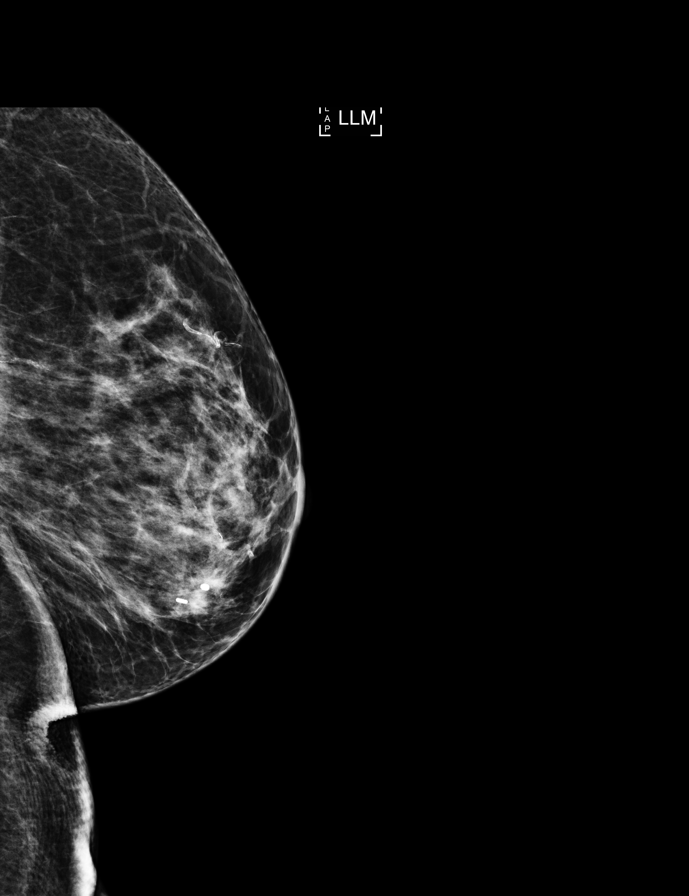

[L CC (1 of 4)]
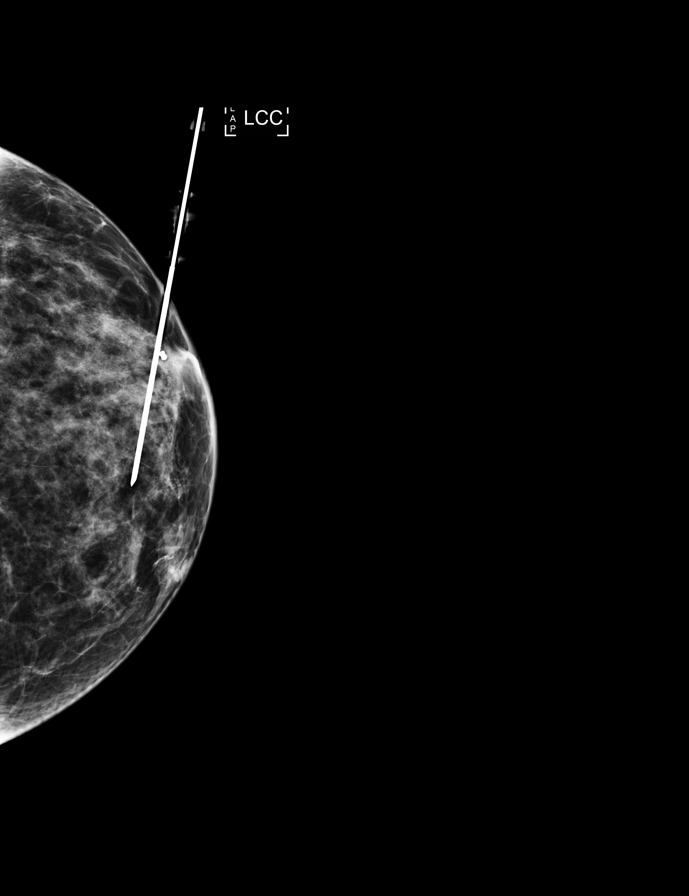

[L CC (2 of 4)]
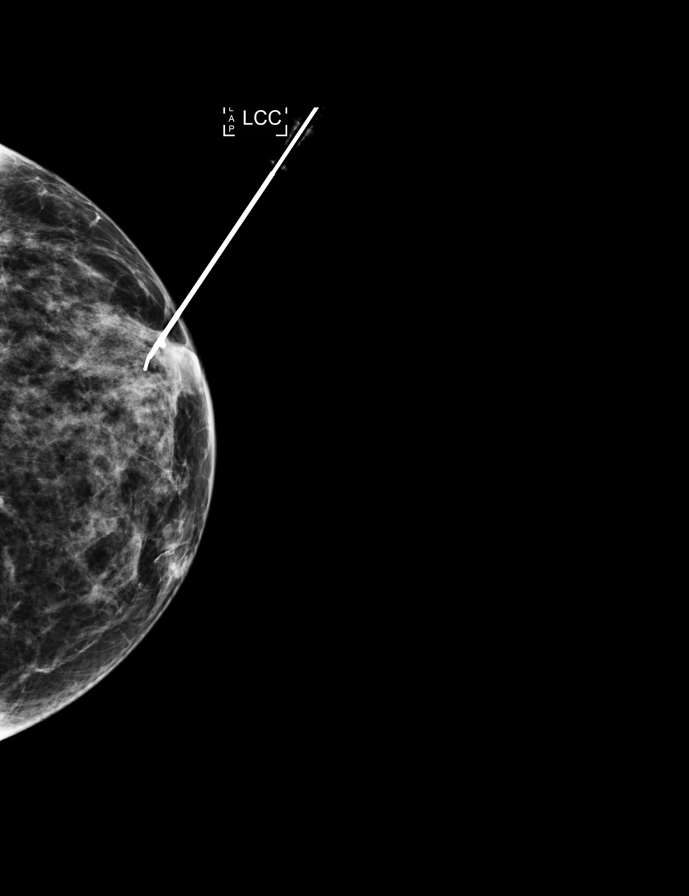

[L LM (2 of 4)]
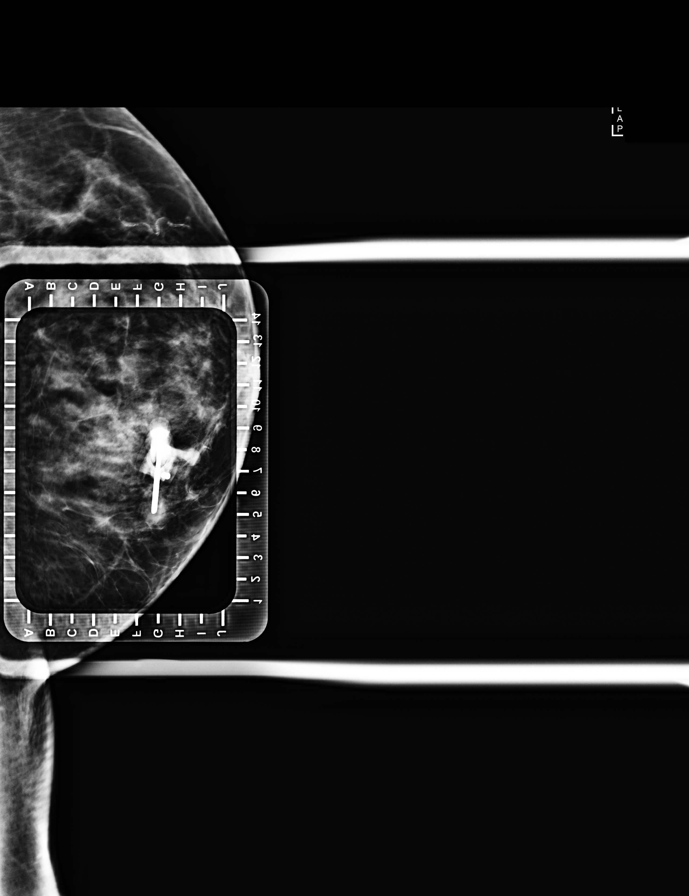

[L LM (3 of 4)]
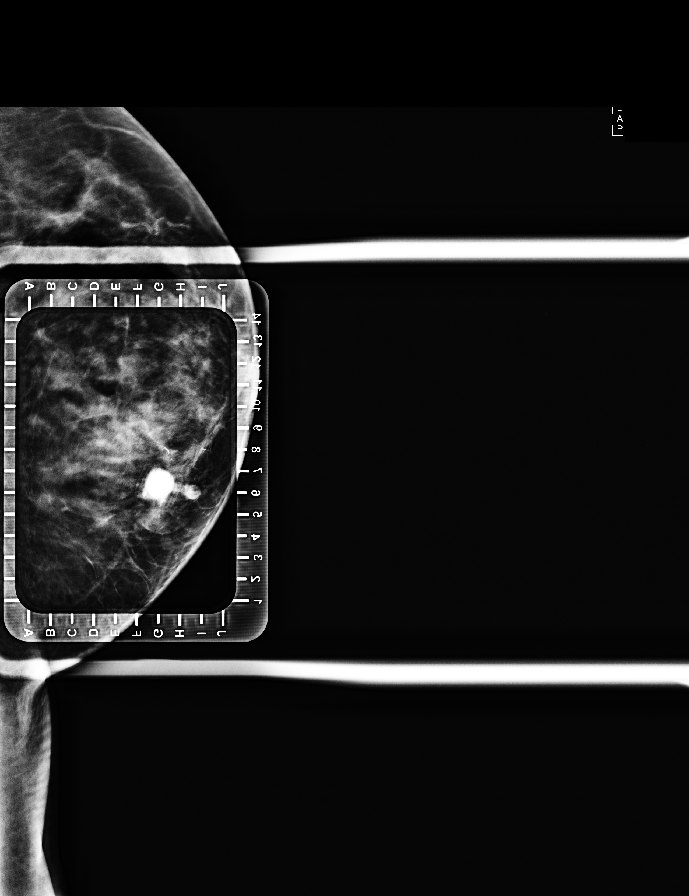

[L CC (3 of 4)]
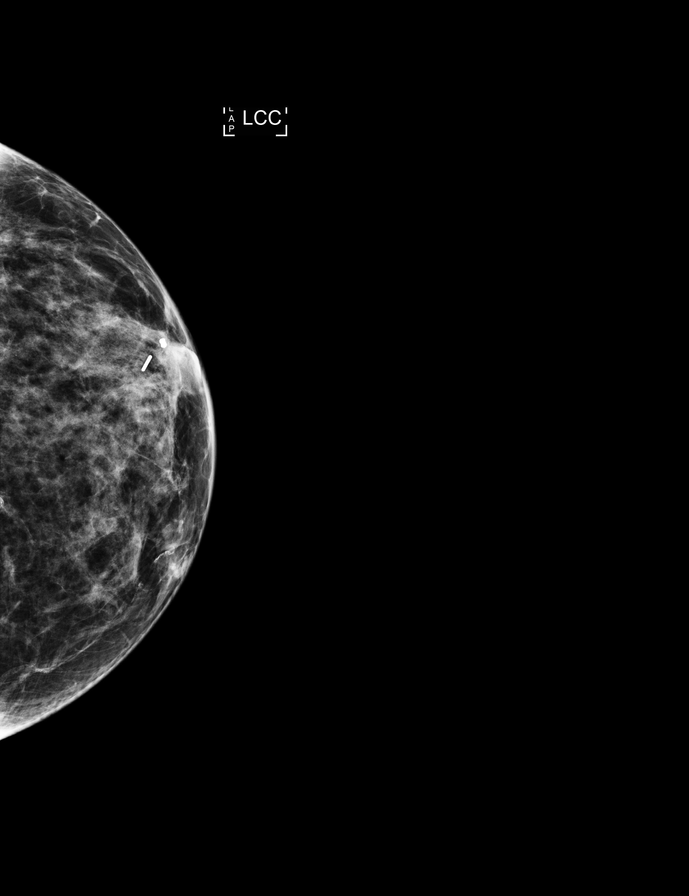

[L LM (4 of 4)]
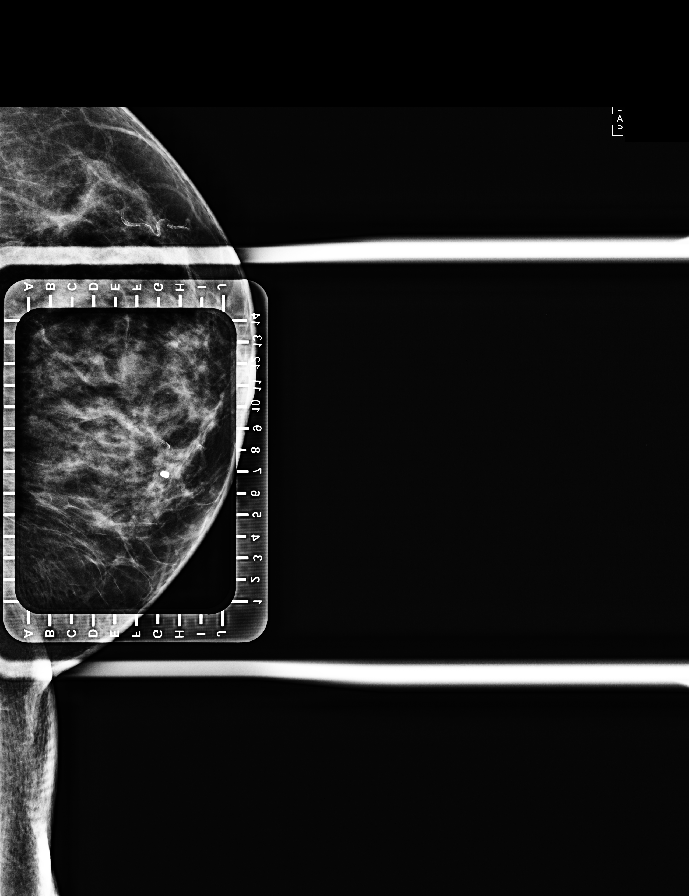

[L CC (4 of 4)]
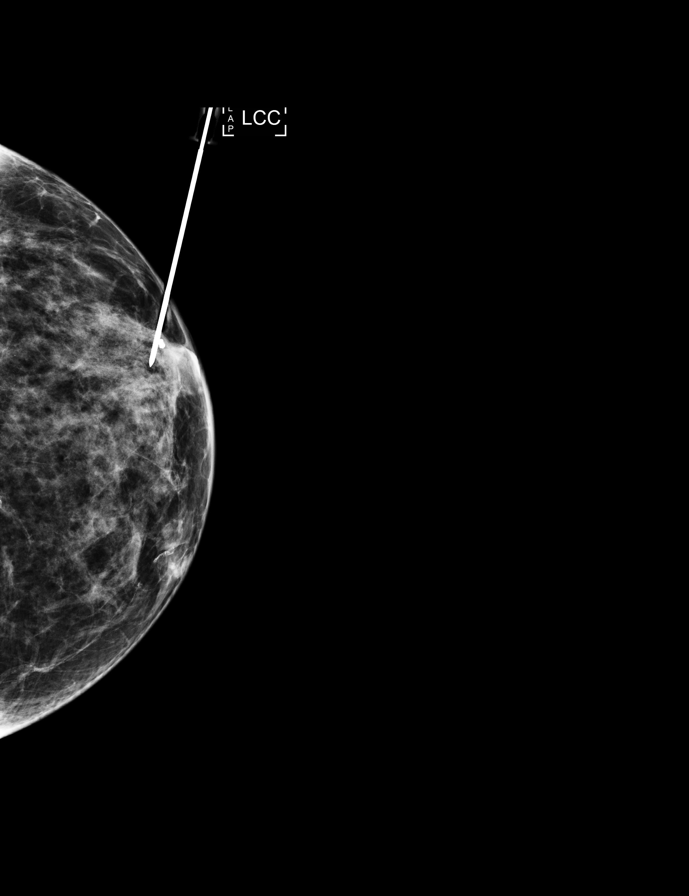

[8 of 8 positions shown; findings below may reference images not displayed]

FINDINGS: Patient presents for radioactive seed localization prior to LEFT
breast excisional biopsy. I met with the patient and we discussed
the procedure of seed localization including benefits and
alternatives. We discussed the high likelihood of a successful
procedure. We discussed the risks of the procedure including
infection, bleeding, tissue injury and further surgery. We discussed
the low dose of radioactivity involved in the procedure. Informed,
written consent was given.

The usual time-out protocol was performed immediately prior to the
procedure.

Using mammographic guidance, sterile technique with chlorhexidine as
skin antisepsis, 1% lidocaine as local anesthetic, an 0-I2Z
radioactive seed was used to localize the dumbbell shaped tissue
marker clip associated with the biopsy-proven possible papillary
lesion using a lateral approach. The follow-up mammogram images
confirm that the seed is positioned adjacent to the clip, located 4
mm inferomedial to the clip. The images are were marked for Dr.
Doll.

Follow-up survey of the patient confirms the presence of the
radioactive seed.

Order number of 0-[id number] seed: [id number]

Total activity: 0.257 mCi

Reference Date: 11/11/2020

The patient tolerated the procedure well and was released from the
[REDACTED]. She was given instructions regarding seed removal.
IMPRESSION: Radioactive seed localization of a possible papillary lesion
involving the LEFT breast breast. No apparent complications.

## 2021-11-09 IMAGING — MG MM BREAST LOCALIZATION CLIP
4 series · 4 of 12 positions shown · non-contrast
Comparison: Previous exam(s).

CLINICAL DATA: 59-year-old with multicentric RIGHT breast cancer
metastatic to a RIGHT axillary lymph node for which the patient
underwent neoadjuvant chemotherapy with a complete imaging response
to treatment. She is planning on having RIGHT mastectomy and
targeted RIGHT axillary node removal and sentinel node biopsy.
Radioactive seed localization of the biopsy-proven metastatic lymph
node is performed in anticipation of her surgeries which are
scheduled for 11/28/2020.

She also has a possible papillary lesion in the LOWER subareolar
LEFT breast for which radioactive seed localization was performed
earlier today, reported separately.
EXAM:
ULTRASOUND GUIDED RADIOACTIVE SEED LOCALIZATION OF THE RIGHT AXILLA
DIAGNOSTIC RIGHT MAMMOGRAM TO CONFIRM SEED PLACEMENT

[R MLO synth-2D]
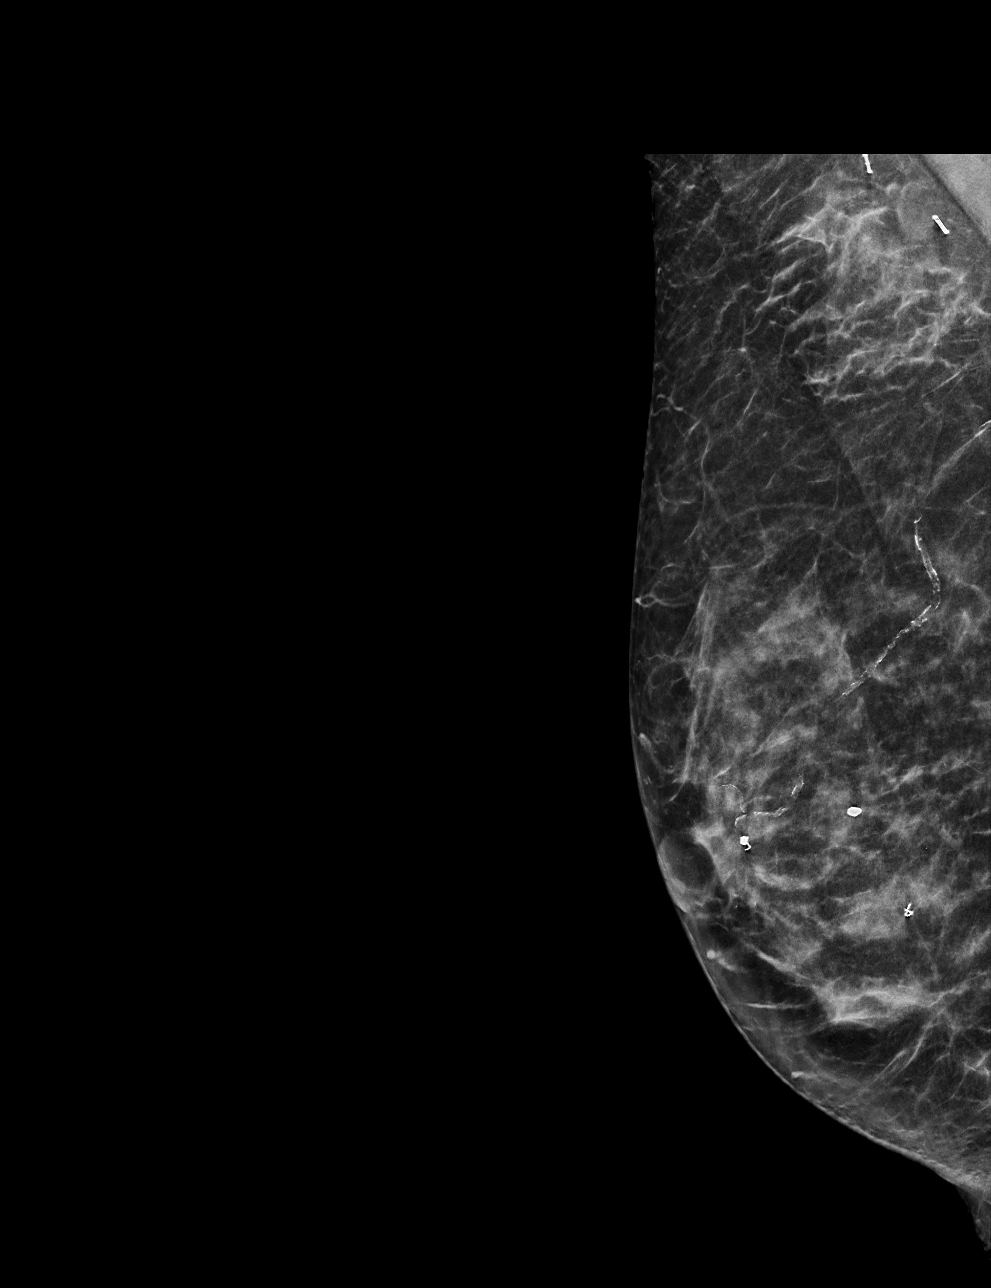

[R XCCL synth-2D]
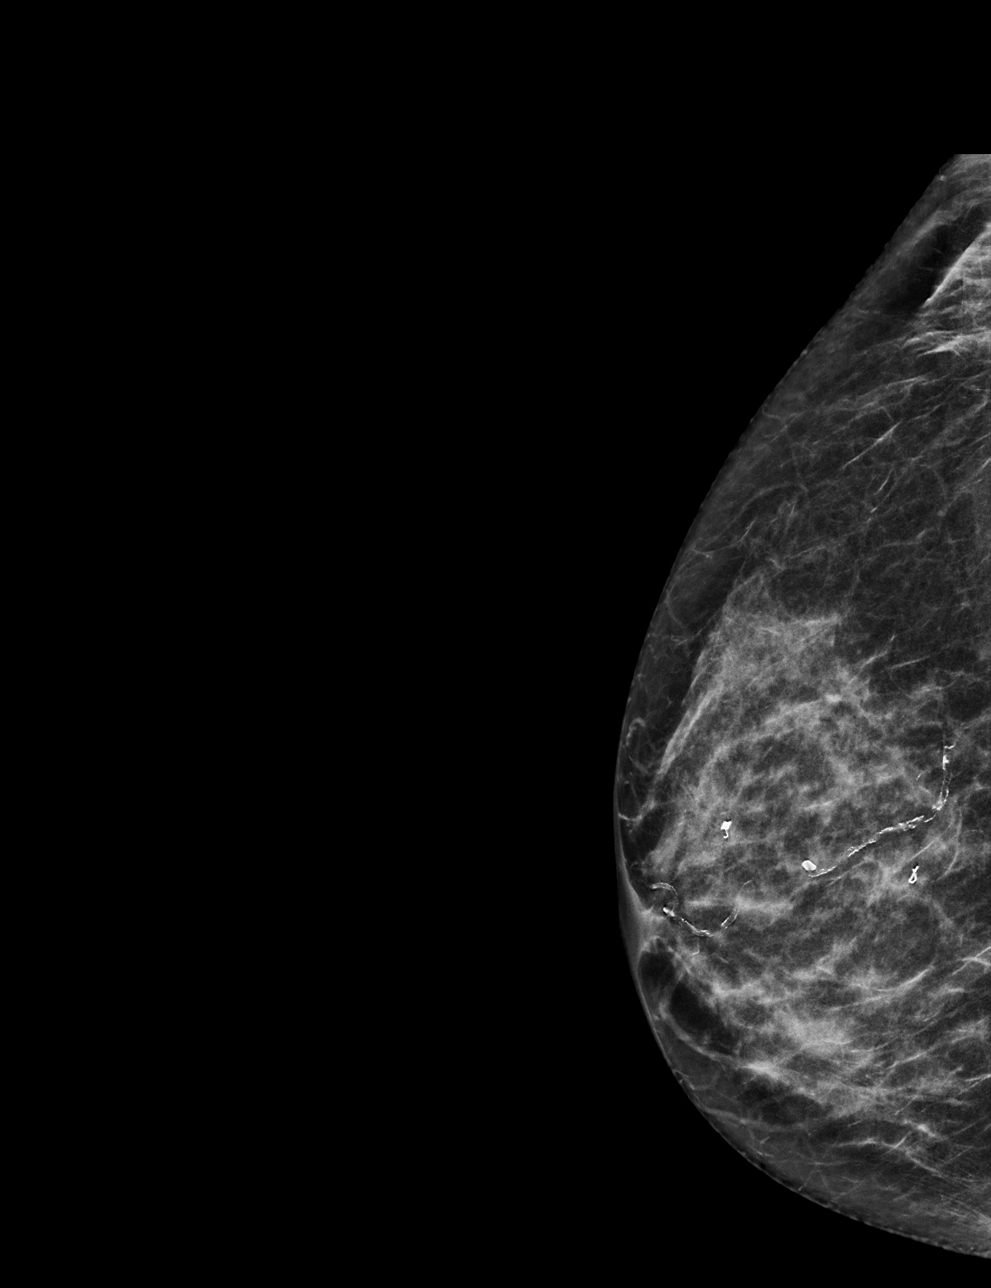

[R MLO tomo · tomo slice 27/54.0]
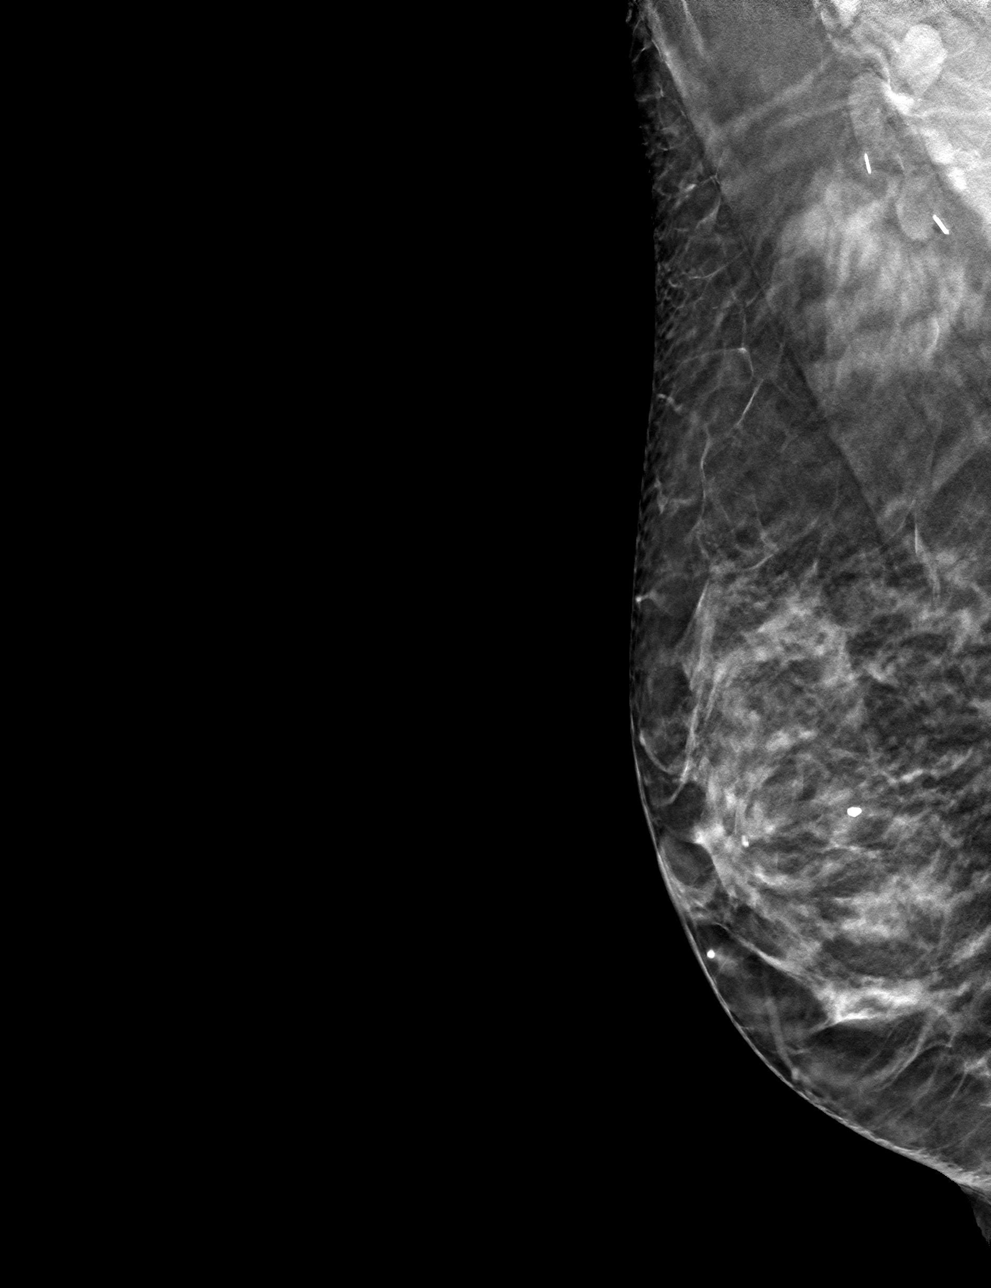

[R XCCL tomo · tomo slice 26/51.0]
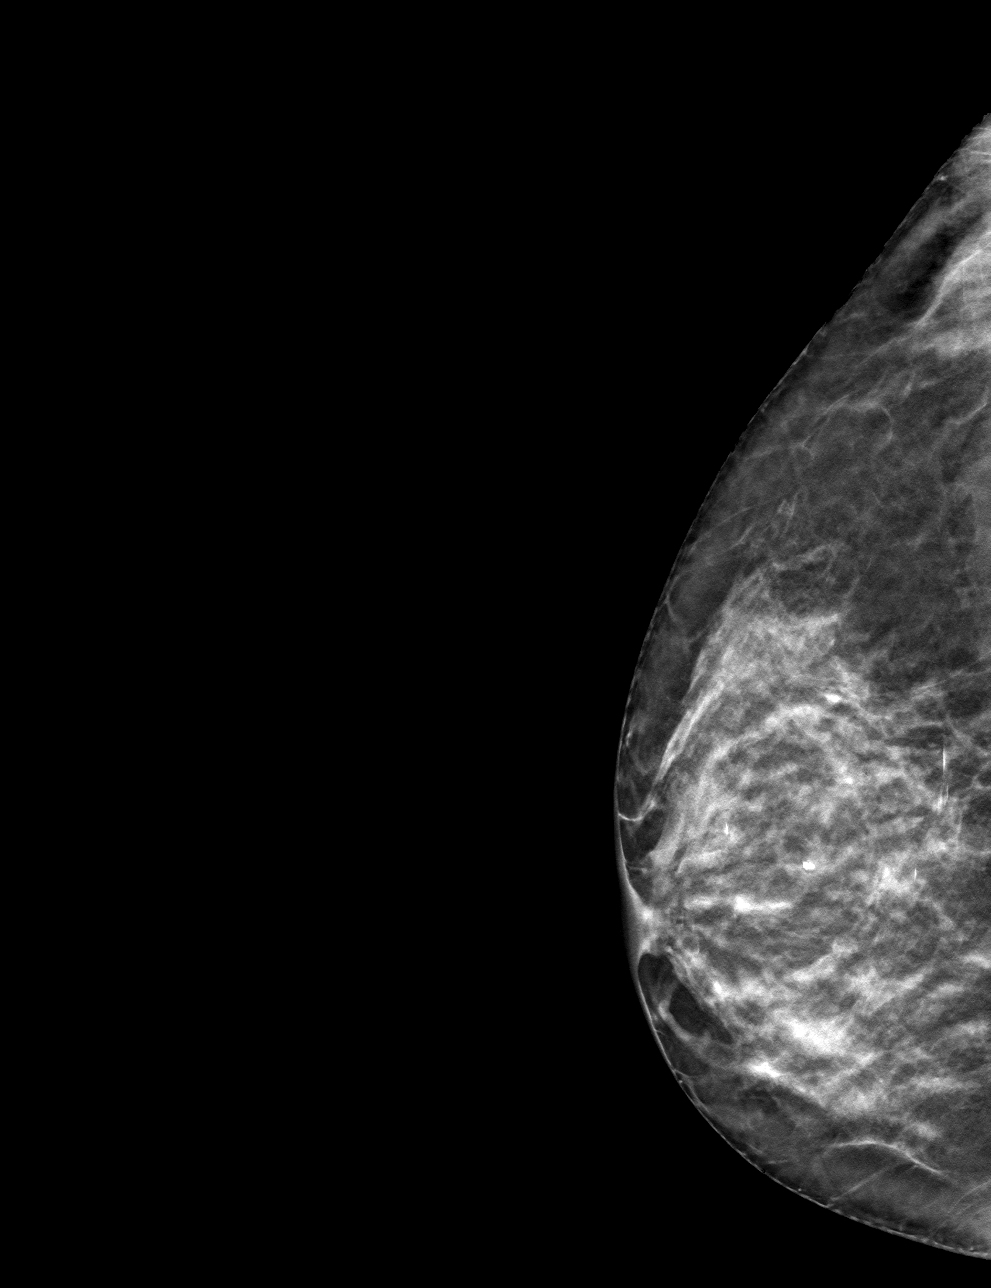

[4 of 12 positions shown; findings below may reference images not displayed]

FINDINGS: Patient presents for radioactive seed localization prior to targeted
RIGHT axillary resection. I met with the patient and we discussed
the procedure of seed localization including benefits and
alternatives. We discussed the high likelihood of a successful
procedure. We discussed the risks of the procedure including
infection, bleeding, tissue injury and further surgery. We discussed
the low dose of radioactivity involved in the procedure. Informed,
written consent was given.

The usual time-out protocol was performed immediately prior to the
procedure.

Using ultrasound guidance, sterile technique with chlorhexidine as
skin antisepsis, 1% lidocaine as local anesthetic, an U-[id number]
radioactive seed was used to localize the Venus clip associated with
the previous biopsy-proven RIGHT axillary lymph node using an
inferolateral approach. The follow-up diagnostic MLO mammogram image
shows that the seed is in in a lymph node immediately adjacent to
the biopsied lymph node, the seed being approximately 1.2 cm
inferior to the Venus clip placed at the time of biopsy. The image
is marked for Dr. Chinan.

Follow-up survey of the patient confirms presence of the radioactive
seed.

Order number of U-86B seed: [id number]

Total activity:  0.257 mCi

Reference Date: 11/11/2020

The patient tolerated the procedure well and was released from the
[REDACTED]. She was given instructions regarding seed removal.
IMPRESSION: Radioactive seed localization of a RIGHT axillary lymph node which
is immediately adjacent to the previously biopsied lymph node which
was not visible sonographically. The seed is approximately 1.2 cm
inferior to the Venus clip placed at the time of biopsy. No
apparent complications.

## 2021-11-09 NOTE — Progress Notes (Signed)
Patient Care Team: Wenda Low, MD as PCP - General (Internal Medicine) [name], MD as Consulting Physician (General Surgery) Nicholas Lose, MD as Consulting Physician (Hematology and Oncology) Kyung Rudd, MD as Consulting Physician (Radiation Oncology) Jacelyn Pi, MD as Referring Physician (Endocrinology) Lona Millard, MD as Referring Physician (Endocrinology) Arta Silence, MD as Consulting Physician (Gastroenterology)  DIAGNOSIS:  Encounter Diagnosis  Name Primary?   Malignant neoplasm of overlapping sites of right breast in female, estrogen receptor positive ([place])     SUMMARY OF ONCOLOGIC HISTORY: Oncology History  Malignant neoplasm of overlapping sites of right breast in female, estrogen receptor positive ([place])  06/21/2020 Initial Diagnosis   Patient palpated a right breast mass x 1 wk. Mammogram showed multiple confluent masses in the right breast at the 7 o'clock position measuring 2.3cm and at the 9 o'clock position measuring 2.2cm with one right axillary lymph node with cortical thickening. Biopsy showed invasive mammary carcinoma at both positions and in the axilla, grade 2-3, HER-2 equivocal by IHC (2+), positive by FISH (ratio 3.34), ER+ >95%, PR+ 25%, Ki67 60%.   06/29/2020 Cancer Staging   Staging form: Breast, AJCC 8th Edition - Clinical stage from 06/29/2020: Stage IB (cT2, cN1(f), cM0, G3, ER+, PR+, HER2+) - Signed by Nicholas Lose, MD on 06/29/2020    07/13/2020 - 11/16/2020 Neo-Adjuvant Chemotherapy   Taxotere, Carbo, Herceptin, Perjeta x 6 followed by 1 cycle of herceptin/perjeta   10/28/2020 Breast MRI   Complete imaging response   11/14/2020 Genetic Testing   Negative hereditary cancer genetic testing: no pathogenic variants detected in [facility] BRCAPlus Panel and [facility] CancerNext-Expanded +RNAinsight Panel.  The report dates are Nov 14, 2020 and December 05, 2020, respectively.    The BRCAplus panel offered by [employer] and includes sequencing  and deletion/duplication analysis for the following 8 genes: ATM, BRCA1, BRCA2, CDH1, CHEK2, PALB2, PTEN, and TP53.  The CancerNext-Expanded gene panel offered by [facility] and includes sequencing, rearrangement, and RNA analysis for the following 77 genes: AIP, ALK, APC, ATM, AXIN2, BAP1, BARD1, BLM, BMPR1A, BRCA1, BRCA2, BRIP1, CDC73, CDH1, CDK4, CDKN1B, CDKN2A, CHEK2, CTNNA1, DICER1, FANCC, FH, FLCN, GALNT12, KIF1B, LZTR1, MAX, MEN1, MET, MLH1, MSH2, MSH3, MSH6, MUTYH, NBN, NF1, NF2, NTHL1, PALB2, PHOX2B, PMS2, POT1, PRKAR1A, PTCH1, PTEN, RAD51C, RAD51D, RB1, RECQL, RET, SDHA, SDHAF2, SDHB, SDHC, SDHD, SMAD4, SMARCA4, SMARCB1, SMARCE1, STK11, SUFU, TMEM127, TP53, TSC1, TSC2, VHL and XRCC2 (sequencing and deletion/duplication); EGFR, EGLN1, HOXB13, KIT, MITF, PDGFRA, POLD1, and POLE (sequencing only); EPCAM and GREM1 (deletion/duplication only).    11/28/2020 Surgery   Right mastectomy [name]): IDC, grade 3, spanning 4.0cm fibrotic area, clear margins, 1/4 right axillary lymph nodes positive for carcinoma, 0.5cm.   Left lumpectomy: no evidence of malignancy   11/28/2020 Cancer Staging   Staging form: Breast, AJCC 8th Edition - Pathologic stage from 11/28/2020: No Stage Recommended (ypT2, pN1a, cM0) - Signed by Gardenia Phlegm, NP on 07/26/2021 Stage prefix: Post-therapy    12/07/2020 - 05/23/2021 Chemotherapy   Patient is on Treatment Plan : BREAST ADO-Trastuzumab Emtansine (Kadcyla) q21d      01/04/2021 - 02/17/2021 Radiation Therapy   Site Technique Total Dose (Gy) Dose per Fx (Gy) Completed Fx Beam Energies  Chest Wall, Right: CW_Rt 3D 50.4/50.4 1.8 28/28 6X  Chest Wall, Right: CW_Rt_SCLV 3D 50.4/50.4 1.8 28/28 6X, 10X  Chest Wall, Right: CW_Rt_Bst Electron 10/10 2 5/5 6E     12/2020 -  Anti-estrogen oral therapy   Letrozole daily     CHIEF COMPLIANT:  Breast cancer surveillance on Letrozole.    INTERVAL HISTORY: Teresa Sheppard is a 61 year old above-mentioned history of  HER2 positive breast cancer treated with neoadjuvant chemotherapy followed by mastectomy and adjuvant Kadcyla. She presents to the clinic today for a follow-up.  She is tolerating letrozole extremely well.  She is here today to discuss the pros and cons of starting on neratinib.  Physically emotionally she is doing much better.   ALLERGIES:  is allergic to shellfish-derived products, oxycodone hcl, shrimp extract allergy skin test, hydrocodone, and penicillins.  MEDICATIONS:  Current Outpatient Medications  Medication Sig Dispense Refill   estradiol (ESTRACE VAGINAL) 0.1 MG/GM vaginal cream Place 1 Applicatorful vaginally at bedtime. 42.5 g 12   Neratinib Maleate (NERLYNX) 40 MG tablet Take 4 tablets (160 mg total) by mouth daily. Take with food. 120 tablet 1   acetaminophen (TYLENOL) 325 MG tablet Take 325 mg by mouth every 6 (six) hours as needed (headaches / pain).     Calcium-Vitamin D-Vitamin K ([employer] CALCIUM SOFT CHEWS) 650-12.5-40 MG-MCG-MCG CHEW Chew 1 tablet by mouth 2 (two) times daily.     cholecalciferol (VITAMIN D3) 25 MCG (1000 UNIT) tablet Take 1,000 Units by mouth daily.     ciclopirox (PENLAC) 8 % solution Apply topically at bedtime. Apply over nail and surrounding skin. Apply daily over previous coat. After seven (7) days, may remove with alcohol and continue cycle. 6.6 mL 0   hydrocortisone cream 1 % Apply 1 application topically 4 (four) times daily as needed for itching.     letrozole (FEMARA) 2.5 MG tablet Take 1 tablet (2.5 mg total) by mouth daily. 90 tablet 3   Multiple Vitamins-Minerals (HAIR/SKIN/NAILS/BIOTIN PO) Take 1 capsule by mouth daily.     naproxen (NAPROSYN) 500 MG tablet Take 500 mg by mouth 2 (two) times daily as needed.     Polyethyl Glycol-Propyl Glycol (SYSTANE OP) Place 1 drop into both eyes daily as needed (dry eyes).     No current facility-administered medications for this visit.    PHYSICAL EXAMINATION: ECOG PERFORMANCE STATUS: 1 - Symptomatic but  completely ambulatory  Vitals:   11/22/21 1007  BP: 133/64  Pulse: 62  Resp: 18  Temp: (!) 97.5 F (36.4 C)  SpO2: 100%   Filed Weights   11/22/21 1007  Weight: 144 lb 12.8 oz (65.7 kg)      LABORATORY DATA:  I have reviewed the data as listed    Latest Ref Rng & Units 11/22/2021   10:01 AM 11/08/2021   10:45 AM 05/23/2021   10:57 AM  CMP  Glucose 70 - 99 mg/dL 85   87   102    BUN 6 - 20 mg/dL 13   12   13     Creatinine 0.44 - 1.00 mg/dL 0.76   0.76   0.73    Sodium 135 - 145 mmol/L 139   140   137    Potassium 3.5 - 5.1 mmol/L 3.5   3.9   4.1    Chloride 98 - 111 mmol/L 103   105   105    CO2 22 - 32 mmol/L 30   28   26     Calcium 8.9 - 10.3 mg/dL 9.9   9.7   9.5    Total Protein 6.5 - 8.1 g/dL 7.7   7.8   7.8    Total Bilirubin 0.3 - 1.2 mg/dL 0.6   0.6   0.7    Alkaline Phos 38 - 126  U/L 139   187   136    AST 15 - 41 U/L 41   63   56    ALT 0 - 44 U/L 33   55   34      Lab Results  Component Value Date   WBC 3.0 (L) 11/22/2021   HGB 12.8 11/22/2021   HCT 37.9 11/22/2021   MCV 94.5 11/22/2021   PLT 107 (L) 11/22/2021   NEUTROABS 1.7 11/22/2021    ASSESSMENT & PLAN:  Malignant neoplasm of overlapping sites of right breast in female, estrogen receptor positive ([place]) 06/21/2020:Patient palpated a right breast mass x 1 wk. Mammogram showed multiple confluent masses in the right breast at the 7 o'clock position measuring 2.3cm and at the 9 o'clock position measuring 2.2cm with one right axillary lymph node with cortical thickening. Biopsy showed invasive mammary carcinoma at both positions and in the axilla, grade 2-3, HER-2 equivocal by IHC (2+), positive by FISH (ratio 3.34), ER+ >95%, PR+ 25%, Ki67 60%.   Treatment plan: 1. Neoadjuvant chemotherapy with TCH Perjeta 6 cycles completed 10/26/2020 followed by Herceptin Perjeta versus [name] for 1 year completed 05/23/2021 2.  Right mastectomy [name]): IDC, grade 3, spanning 4.0cm fibrotic area,  clear margins, 1/4 right axillary lymph nodes positive for carcinoma, 0.5cm. Left lumpectomy: no evidence of malignancy 3. Followed by adjuvant radiation therapy completed 02/17/2021 4.  Followed by adjuvant antiestrogen therapy started 12/29/2020 5.  Followed by neratinib (to be started July 2023) ---------------------------------------------------------------------------------------------------------------------------------- Current Treatment:letrozole    Letrozole toxicities: Tolerating it extremely well.    Surveillance:  1. Mammograms June 08, 2021 at [facility]: Benign Bone density: November 2022: T score -2.4: Recommended Prolia along with calcium and vitamin D. 2. Breast MRI: 11/17/2021: To my evaluation there does not appear to be any abnormalities.  We will await for the official radiology reading.   We once again discussed the role of neratinib.  She would like to start this in end of June 2023. I sent a prescription for neratinib.  Once it gets approved she will start the medication at the end of June. After that we can set her up for follow-ups with Jenny Reichmann 2 weeks after starting neratinib. She will start at 2 tablets a day and then slowly titrate upwards to 4 tablets a day by the time she meets with Jenny Reichmann.   No orders of the defined types were placed in this encounter.  The patient has a good understanding of the overall plan. she agrees with it. she will call with any problems that may develop before the next visit here. Total time spent: 30 mins including face to face time and time spent for planning, charting and co-ordination of care   Harriette Ohara, MD 11/22/21    I Gardiner Coins am scribing for [name]  I have reviewed the above documentation for accuracy and completeness, and I agree with the above.

## 2021-11-10 ENCOUNTER — Telehealth: Payer: Self-pay

## 2021-11-10 ENCOUNTER — Encounter: Payer: Self-pay | Admitting: Adult Health

## 2021-11-10 ENCOUNTER — Encounter: Payer: Self-pay | Admitting: Hematology and Oncology

## 2021-11-10 LAB — URINE CULTURE: Culture: >=100000 — AB

## 2021-11-10 NOTE — Telephone Encounter (Signed)
Pt called asking about her UTI and her antibiotics. Per Anda Kraft, [place] pt can take her probiotics, pt verbalized understanding. Pt also reported that she was still having symptoms of her UTI (urgency and swollen lymphnodes) but denied any pain and reported feeling "ok overall". Pt was assured that her antibiotic was appropriate for her urine culture. Pt was also instructed that if she was still having symptoms she needed to go to her PCP, however if she was feeling worse or began to feel worse she would need to go to the ED. Pt verbalized agreement and stated that she would make an appointment with her PCP after we hung up. Pt had no further questions at this time.

## 2021-11-13 ENCOUNTER — Ambulatory Visit: Payer: BC Managed Care – PPO | Admitting: Physical Therapy

## 2021-11-13 DIAGNOSIS — R945 Abnormal results of liver function studies: Secondary | ICD-10-CM | POA: Diagnosis not present

## 2021-11-13 DIAGNOSIS — N390 Urinary tract infection, site not specified: Secondary | ICD-10-CM | POA: Diagnosis not present

## 2021-11-14 ENCOUNTER — Encounter: Payer: Self-pay | Admitting: Adult Health

## 2021-11-14 ENCOUNTER — Other Ambulatory Visit: Payer: BC Managed Care – PPO

## 2021-11-14 DIAGNOSIS — K635 Polyp of colon: Secondary | ICD-10-CM | POA: Diagnosis not present

## 2021-11-14 DIAGNOSIS — Z1211 Encounter for screening for malignant neoplasm of colon: Secondary | ICD-10-CM | POA: Diagnosis not present

## 2021-11-15 ENCOUNTER — Ambulatory Visit
Admission: RE | Admit: 2021-11-15 | Discharge: 2021-11-15 | Disposition: A | Discharge status: Home / Self Care | Payer: BC Managed Care – PPO | Source: Ambulatory Visit | Attending: Hematology and Oncology | Admitting: Hematology and Oncology

## 2021-11-15 DIAGNOSIS — C50811 Malignant neoplasm of overlapping sites of right female breast: Secondary | ICD-10-CM

## 2021-11-16 ENCOUNTER — Telehealth: Payer: Self-pay

## 2021-11-16 DIAGNOSIS — N390 Urinary tract infection, site not specified: Secondary | ICD-10-CM | POA: Diagnosis not present

## 2021-11-16 NOTE — Telephone Encounter (Signed)
Return call to pt to further discuss her concerns with recent UTI.  Pt states she was seen by PCP for recheck where they obtained another urine sample for culture.  I informed pt that, per her request, I faxed previous culture results and sensitivity to PCP for comparison.  I advised pt to also consult her OBGYN who has been doing her [place] lisa touch procedures as maybe they are more aware of this issue with such procedures and could help as well.  I advised pt that should she develop any worsening symptoms to seek care.  We reviewed symptoms of fever, pain, n/v, inability to urinate.  Pt verbalized understanding and thanks and plans to check in with our office after her PCP finalizes most recent urine culture.

## 2021-11-17 ENCOUNTER — Ambulatory Visit
Admission: RE | Admit: 2021-11-17 | Discharge: 2021-11-17 | Disposition: A | Discharge status: Home / Self Care | Payer: BC Managed Care – PPO | Source: Ambulatory Visit | Attending: Hematology and Oncology | Admitting: Hematology and Oncology

## 2021-11-17 DIAGNOSIS — Z853 Personal history of malignant neoplasm of breast: Secondary | ICD-10-CM | POA: Diagnosis not present

## 2021-11-17 DIAGNOSIS — Z1239 Encounter for other screening for malignant neoplasm of breast: Secondary | ICD-10-CM | POA: Diagnosis not present

## 2021-11-17 MED ORDER — GADOBUTROL 1 MMOL/ML IV SOLN
6.0000 mL | Freq: Once | INTRAVENOUS | Status: AC | PRN
  Administered 2021-11-17: 6 mL via INTRAVENOUS

## 2021-11-22 ENCOUNTER — Telehealth: Payer: Self-pay | Admitting: Pharmacy Technician

## 2021-11-22 ENCOUNTER — Other Ambulatory Visit (HOSPITAL_COMMUNITY): Payer: Self-pay

## 2021-11-22 ENCOUNTER — Other Ambulatory Visit: Payer: Self-pay

## 2021-11-22 ENCOUNTER — Telehealth: Payer: Self-pay

## 2021-11-22 ENCOUNTER — Inpatient Hospital Stay: Payer: BC Managed Care – PPO | Admitting: Hematology and Oncology

## 2021-11-22 ENCOUNTER — Inpatient Hospital Stay: Payer: BC Managed Care – PPO

## 2021-11-22 DIAGNOSIS — Z170 Estrogen receptor positive status [ER+]: Secondary | ICD-10-CM | POA: Diagnosis not present

## 2021-11-22 DIAGNOSIS — R21 Rash and other nonspecific skin eruption: Secondary | ICD-10-CM | POA: Diagnosis not present

## 2021-11-22 DIAGNOSIS — Z9221 Personal history of antineoplastic chemotherapy: Secondary | ICD-10-CM | POA: Diagnosis not present

## 2021-11-22 DIAGNOSIS — Z806 Family history of leukemia: Secondary | ICD-10-CM | POA: Diagnosis not present

## 2021-11-22 DIAGNOSIS — Z800 Family history of malignant neoplasm of digestive organs: Secondary | ICD-10-CM | POA: Diagnosis not present

## 2021-11-22 DIAGNOSIS — C50811 Malignant neoplasm of overlapping sites of right female breast: Secondary | ICD-10-CM | POA: Diagnosis not present

## 2021-11-22 DIAGNOSIS — Z923 Personal history of irradiation: Secondary | ICD-10-CM | POA: Diagnosis not present

## 2021-11-22 DIAGNOSIS — D696 Thrombocytopenia, unspecified: Secondary | ICD-10-CM | POA: Diagnosis not present

## 2021-11-22 DIAGNOSIS — N390 Urinary tract infection, site not specified: Secondary | ICD-10-CM | POA: Diagnosis not present

## 2021-11-22 DIAGNOSIS — Z803 Family history of malignant neoplasm of breast: Secondary | ICD-10-CM | POA: Diagnosis not present

## 2021-11-22 DIAGNOSIS — Z79811 Long term (current) use of aromatase inhibitors: Secondary | ICD-10-CM | POA: Diagnosis not present

## 2021-11-22 LAB — CBC WITH DIFFERENTIAL/PLATELET
Abs Immature Granulocytes: 0.01 10*3/uL (ref 0.00–0.07)
Basophils Absolute: 0 10*3/uL (ref 0.0–0.1)
Basophils Relative: 1 %
Eosinophils Absolute: 0.2 10*3/uL (ref 0.0–0.5)
Eosinophils Relative: 5 %
HCT: 37.9 % (ref 36.0–46.0)
Hemoglobin: 12.8 g/dL (ref 12.0–15.0)
Immature Granulocytes: 0 %
Lymphocytes Relative: 28 %
Lymphs Abs: 0.9 10*3/uL (ref 0.7–4.0)
MCH: 31.9 pg (ref 26.0–34.0)
MCHC: 33.8 g/dL (ref 30.0–36.0)
MCV: 94.5 fL (ref 80.0–100.0)
Monocytes Absolute: 0.3 10*3/uL (ref 0.1–1.0)
Monocytes Relative: 10 %
Neutro Abs: 1.7 10*3/uL (ref 1.7–7.7)
Neutrophils Relative %: 56 %
Platelets: 107 10*3/uL — ABNORMAL LOW (ref 150–400)
RBC: 4.01 MIL/uL (ref 3.87–5.11)
RDW: 12 % (ref 11.5–15.5)
WBC: 3 10*3/uL — ABNORMAL LOW (ref 4.0–10.5)
nRBC: 0 % (ref 0.0–0.2)

## 2021-11-22 LAB — CMP (CANCER CENTER ONLY)
ALT: 33 U/L (ref 0–44)
AST: 41 U/L (ref 15–41)
Albumin: 4.2 g/dL (ref 3.5–5.0)
Alkaline Phosphatase: 139 U/L — ABNORMAL HIGH (ref 38–126)
Anion gap: 6 (ref 5–15)
BUN: 13 mg/dL (ref 6–20)
CO2: 30 mmol/L (ref 22–32)
Calcium: 9.9 mg/dL (ref 8.9–10.3)
Chloride: 103 mmol/L (ref 98–111)
Creatinine: 0.76 mg/dL (ref 0.44–1.00)
GFR, Estimated: >60 mL/min (ref >60)
Glucose, Bld: 85 mg/dL (ref 70–99)
Potassium: 3.5 mmol/L (ref 3.5–5.1)
Sodium: 139 mmol/L (ref 135–145)
Total Bilirubin: 0.6 mg/dL (ref 0.3–1.2)
Total Protein: 7.7 g/dL (ref 6.5–8.1)

## 2021-11-22 MED ORDER — ESTRADIOL 0.1 MG/GM VA CREA: 1.0000 | TOPICAL_CREAM | Freq: Every day | VAGINAL | 12 refills | Status: DC

## 2021-11-22 MED ORDER — NERATINIB MALEATE 40 MG PO TABS
160.0000 mg | ORAL_TABLET | Freq: Every day | ORAL | 1 refills | Status: DC
  Filled 2021-11-22: qty 120, 30d supply, fill #0
  Filled 2021-12-14: qty 60, 15d supply, fill #0
  Filled 2022-01-11: qty 60, 15d supply, fill #1
  Filled 2022-01-24: qty 60, 15d supply, fill #2

## 2021-11-22 NOTE — Telephone Encounter (Signed)
Oral Oncology Patient Advocate Encounter   Was successful in obtaining a copay card for Nerlynx.  This copay card will make the patients copay $10.    The billing information is as follows and has been shared with [facility].   RxBin: [id number] Member ID: [id number] Group ID: [id number]   Lady Deutscher, CPhT-Adv Pharmacy Patient Advocate Specialist [place] Patient Advocate Team Direct Number: [phone]  Fax: [phone]

## 2021-11-22 NOTE — Assessment & Plan Note (Signed)
06/21/2020:Patient palpated a right breast mass x 1 wk. Mammogram showed multiple confluent masses in the right breast at the 7 o'clock position measuring 2.3cm and at the 9 o'clock position measuring 2.2cm with one right axillary lymph node with cortical thickening. Biopsy showed invasive mammary carcinoma at both positions and in the axilla, grade 2-3, HER-2 equivocal by IHC (2+), positive by FISH (ratio 3.34), ER+ >95%, PR+ 25%, Ki67 60%.  Treatment plan: 1. Neoadjuvant chemotherapy with Stryker Perjeta 6 cyclescompleted 5/4/2022followed by HerceptinPerjeta versus Kadcylamaintenance for 1 year completed 05/23/2021 2.Right mastectomy [name]): IDC, grade 3, spanning 4.0cm fibrotic area, clear margins, 1/4 right axillary lymph nodes positive for carcinoma, 0.5cm. Left lumpectomy: no evidence of malignancy 3. Followed by adjuvant radiation therapycompleted 02/17/2021 4.Followed by adjuvant antiestrogen therapystarted 12/29/2020 5.Followed by neratinib (to be started July 2023) ---------------------------------------------------------------------------------------------------------------------------------- Current Treatment:letrozole   Letrozole toxicities: Tolerating it extremely well.  Surveillance:  1[facility] Benign Bone density: November 2022: T score -2.4: Recommended Prolia along with calcium and vitamin D. 2. Breast MRI: 11/17/2021:  We once again discussed the role of neratinib.  She would like to start this in July 2023. Return to clinic in June after the breast MRI to discuss results and then to initiate the prescription for neratinib.

## 2021-11-22 NOTE — Telephone Encounter (Signed)
Oral Oncology Patient Advocate Encounter   Received notification that prior authorization for Nerlynx is required.   PA submitted on 11/22/2021 Key [id number] Status is pending     Lady Deutscher, CPhT-Adv Pharmacy Patient Advocate Specialist [place] Patient Advocate Team Direct Number: [phone]  Fax: [phone]

## 2021-11-22 NOTE — Telephone Encounter (Signed)
Oral Oncology Patient Advocate Encounter  Prior Authorization for Nerlynx has been approved.     Effective dates: 11/22/2021 through 11/21/2022  Patients co-pay is $1,999.62. Patient must fill 15 day supply for starting fills.   Lady Deutscher, CPhT-Adv Pharmacy Patient Advocate Specialist [place] Patient Advocate Team Direct Number: [phone]  Fax: [phone]

## 2021-11-25 DIAGNOSIS — R300 Dysuria: Secondary | ICD-10-CM | POA: Diagnosis not present

## 2021-11-25 DIAGNOSIS — N760 Acute vaginitis: Secondary | ICD-10-CM | POA: Diagnosis not present

## 2021-11-27 ENCOUNTER — Ambulatory Visit: Payer: BC Managed Care – PPO | Attending: Hematology and Oncology | Admitting: Physical Therapy

## 2021-11-27 ENCOUNTER — Encounter: Payer: Self-pay | Admitting: Physical Therapy

## 2021-11-27 DIAGNOSIS — M6281 Muscle weakness (generalized): Secondary | ICD-10-CM | POA: Diagnosis not present

## 2021-11-27 DIAGNOSIS — M62838 Other muscle spasm: Secondary | ICD-10-CM | POA: Insufficient documentation

## 2021-11-27 NOTE — Therapy (Signed)
OUTPATIENT PHYSICAL THERAPY TREATMENT NOTE   Patient Name: Teresa Sheppard MRN: [id number] DOB:Sep 06, 1960, 61 y.o., female Today's Date: 11/27/2021  PCP: Wenda Low, MD REFERRING PROVIDER:  Nicholas Lose, MD    END OF SESSION:   PT End of Session - 11/27/21 1237     Visit Number 2    Date for PT Re-Evaluation 01/23/22    Authorization Type BCBS    PT Start Time 6270    PT Stop Time 3500    PT Time Calculation (min) 38 min    Activity Tolerance Patient tolerated treatment well    Behavior During Therapy WFL for tasks assessed/performed             Past Medical History:  Diagnosis Date   Anxiety    Breast cancer in female [facility])    Right   Family history of breast cancer 11/07/2020   Family history of pancreatic cancer 11/07/2020   History of kidney stones 2014-2015   Hyperthyroidism    No longer an issue   PONV (postoperative nausea and vomiting)    Thyromegaly    Past Surgical History:  Procedure Laterality Date   ABDOMINAL WALL DEFECT REPAIR     following hernia repair in 2010   [place]     2008  while pregnant with last child. Scar tissue from appendectomy wrapped around bowel and was snipped to free bowel.    CESAREAN SECTION     DILATION AND CURETTAGE OF UTERUS     2000 and 2014   HERNIA REPAIR     MASTECTOMY W/ SENTINEL NODE BIOPSY Right 11/28/2020   Procedure: RIGHT MASTECTOMY WITH RIGHT AXILLARY SENTINEL LYMPH NODE BIOPSY;  Surgeon: [name], MD;  Location: [place];  Service: General;  Laterality: Right;   PORT-A-CATH REMOVAL Left 01/30/2021   Procedure: PORT REMOVAL;  Surgeon: [name], MD;  Location: [place];  Service: General;  Laterality: Left;  LOCAL   PORTACATH PLACEMENT Left 07/12/2020   Procedure: INSERTION PORT-A-CATH WITH ULTRASOUND GUIDANCE;  Surgeon: [name], MD;  Location: [place];  Service: General;  Laterality: Left;   RADIOACTIVE SEED GUIDED AXILLARY SENTINEL LYMPH NODE Right 11/28/2020    Procedure: RADIOACTIVE SEED GUIDED RIGHT AXILLARY AXILLARY SENTINEL LYMPH NODE EXCISION;  Surgeon: [name], MD;  Location: [place];  Service: General;  Laterality: Right;   RADIOACTIVE SEED GUIDED EXCISIONAL BREAST BIOPSY Left 11/28/2020   Procedure: RADIOACTIVE SEED GUIDED LEFT EXCISIONAL BREAST BIOPSY;  Surgeon: [name], MD;  Location: [place];  Service: General;  Laterality: Left;   ROOT CANAL     2019   TONSILLECTOMY  1969   URETERAL EXPLORATION     dilataion for congenitally small ureter   Patient Active Problem List   Diagnosis Date Noted   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   Vitamin D deficiency 08/30/2021   Osteoporosis 08/30/2021   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   Genetic testing 11/15/2020   Family history of breast cancer 11/07/2020   Family history of pancreatic cancer 11/07/2020   Dysphagia 10/26/2020   Malignant neoplasm of overlapping sites of right breast in female, estrogen receptor positive ([place]) 06/21/2020   Hyperthyroidism 03/28/2016    REFERRING DIAG: C50.811,Z17.0 (ICD-10-CM) - Malignant neoplasm of overlapping sites of right breast in female, estrogen receptor positive ([place])  THERAPY DIAG:  Muscle weakness (generalized)  Other muscle spasm  Rationale for Evaluation and Treatment Rehabilitation  PERTINENT HISTORY: Hernia repair 2010, appendectomy, bowel resection, c-section, breast cancer, chronic  UTI  PRECAUTIONS: None  SUBJECTIVE: I had a UTI for the past month on different medicines, now that is resolved but I am having pain and bacterial vaginosis and am using cream for that.   PAIN:  Are you having pain? No   OBJECTIVE: (objective measures completed at initial evaluation unless otherwise dated)  PATIENT SURVEYS:      PFIQ-7 POPIQ = 19   COGNITION:            Overall cognitive status: Within functional limits for tasks assessed                              MUSCLE LENGTH: Hamstrings:  Right 80 deg; Left 70 deg     LUMBAR SPECIAL TESTS:  ASLR - harder on left but a little easier with compression   FUNCTIONAL TESTS:    SLS trendelenburg Rt side; unsteady bil GAIT:   Comments: WFL   POSTURE:  Rt hip elevated in standing   LUMBARAROM/PROM   A/PROM A/PROM  10/31/2021  Flexion 75%  Extension    Right lateral flexion    Left lateral flexion    Right rotation    Left rotation     (Blank rows = not tested)   LE ROM:   Passive ROM Right 10/31/2021 Left 10/31/2021  Hip flexion 80% 90%  Hip extension      Hip abduction      Hip adduction      Hip internal rotation 50% 75%  Hip external rotation 50% 75%  Knee flexion      Knee extension      Ankle dorsiflexion      Ankle plantarflexion      Ankle inversion      Ankle eversion       (Blank rows = not tested)   LE MMT:   MMT Right 10/31/2021 Left 10/31/2021  Hip flexion      Hip extension      Hip abduction      Hip adduction      Hip internal rotation      Hip external rotation      Knee flexion      Knee extension      Ankle dorsiflexion      Ankle plantarflexion      Ankle inversion      Ankle eversion        PELVIC MMT:   MMT   10/31/2021  Vaginal 2/5 (weak squeeze, definite lift)  Internal Anal Sphincter    External Anal Sphincter    Puborectalis    Diastasis Recti    (Blank rows = not tested)         PALPATION:   General  tension on the Rt flank and slight side bend in standing                 External Perineal Exam descending perineal body                             Internal Pelvic Floor decreased closure and TTP Lt side more than Rt; difficulty holding contraction and fatigues quickly with reps; stiff muscles   TONE: high   PROLAPSE: None observed - did not do bearing down   TODAY'S TREATMENT  Date: 11/27/21 HEP established-see below  Manual - lumbar and thoracic paraspinals Trigger Point Dry-Needling  Treatment instructions: Expect mild to moderate  muscle soreness. S/S of  pneumothorax if dry needled over a lung field, and to seek immediate medical attention should they occur. Patient verbalized understanding of these instructions and education.  Patient Consent Given: Yes Education handout provided: Yes Muscles treated: T10-12 Electrical stimulation performed: No Parameters: N/A Treatment response/outcome: increased soft tissue lenght Exercise - child pose, threading, qped rotation Self care - f/u and educated on moisturizers; gave handout    EVAL-  info on vaginal moisturizer and sampel of v-magic     PATIENT EDUCATION:  Education details: sample given Person educated: Patient Education method: Explanation Education comprehension: verbalized understanding     HOME EXERCISE PROGRAM: Access Code: [email or web address] URL: [email or web address] Date: 11/27/2021 Prepared by: Jari Favre  Exercises - Child's Pose with Thread the Needle  - 1 x daily - 7 x weekly - 3 sets - 10 reps - Quadruped Full Range Thoracic Rotation with Reach  - 1 x daily - 7 x weekly - 3 sets - 10 reps  Patient Education - Trigger Point Dry Needling    ASSESSMENT:   CLINICAL IMPRESSION: Patient has had more pain due to infections as mentioned above.  Pt was given more info on moisturizers to use and to manually apply them so she is also stretching the pelvic floor muscles.  Pt was given info on dry needling and responded well to treatment.  Stretches were given to continue to address pelvic floor and spinal mobility.  Pt will benefit from skilled PT to address poture and pelvic floor strength and soft tissue health for pain management and reduced risk of complications from estrogen blocking medicine.     OBJECTIVE IMPAIRMENTS decreased coordination, decreased endurance, decreased ROM, decreased strength, postural dysfunction, and pain.    ACTIVITY LIMITATIONS community activity and interpersonal relationships .    PERSONAL FACTORS 1-2 comorbidities: history of  breast cancer, radiation, chemo, anti-estrogen medicine  are also affecting patient's functional outcome.      REHAB POTENTIAL: Excellent   CLINICAL DECISION MAKING: Evolving/moderate complexity   EVALUATION COMPLEXITY: Moderate     GOALS: Goals reviewed with patient? Yes   SHORT TERM GOALS: Target date: 11/28/2021   Ind with initial HEP Baseline: 11/27/21  Goal status: ongoing  2.  Ind with moisturizer for vaginal canal and has found one that feels good for reducing chaffing. Baseline: 11/27/21  Goal status: ongoing       LONG TERM GOALS: Target date: 01/23/2022   Pt will be independent with advanced HEP to maintain improvements made throughout therapy   Baseline:  Goal status: INITIAL   2.  Pt will report 75% reduction of pain due to improvements in posture, strength, and muscle length   Baseline:  Goal status: INITIAL   3.  Pt will have POPIQ score improved by least 5 points. Baseline:  Goal status: INITIAL   4.  Pt will have nocturia decreased to 1-2/night at most   Baseline:  Goal status: INITIAL   5.  Pt will not have pain with voiding due to improved skin integrity and muscle tone Baseline:  Goal status: INITIAL       PLAN: PT FREQUENCY: 1x/week   PT DURATION: 12 weeks   PLANNED INTERVENTIONS: Therapeutic exercises, Therapeutic activity, Neuromuscular re-education, Balance training, Gait training, Patient/Family education, Joint mobilization, Dry Needling, Electrical stimulation, Spinal mobilization, Cryotherapy, Moist heat, Taping, Biofeedback, and Manual therapy   PLAN FOR NEXT SESSION: add to breathing and stretching; table down dog with kegel; internal STM if still having pain and bladder/kidney  releases    [employer], PT 11/27/2021, 12:38 PM

## 2021-11-27 NOTE — Patient Instructions (Signed)
Moisturizers They are used in the vagina to hydrate the mucous membrane that make up the vaginal canal. Designed to keep a more normal acid balance (ph) Once placed in the vagina, it will last between two to three days.  Use 2-3 times per week at bedtime  Ingredients to avoid is glycerin and fragrance, can increase chance of infection Should not be used just before sex due to causing irritation Most are gels administered either in a tampon-shaped applicator or as a vaginal suppository. They are non-hormonal.   Types of Moisturizers(internal use)  Vitamin E vaginal suppositories- Whole foods, Amazon Moist Again Coconut oil- can break down condoms Julva- (Do no use if on Tamoxifen) amazon Yes moisturizer- amazon NeuEve Silk , NeuEve Silver for menopausal or over 65 (if have severe vaginal atrophy or cancer treatments use NeuEve Silk for  1 month than move to [employer])- [employer], Bow.com Olive and Bee intimate cream- www.oliveandbee.com.au Mae vaginal moisturizer- Amazon Aloe    Creams to use externally on the Vulva area [employer] (good for for cancer patients that had radiation to the area)- [place] or [employer].FlyingBasics.com.br V-magic cream - amazon Julva-amazon Vital "V [place] salve ( help moisturize and help with thinning vulvar area, does have Glenn by Irwin Brakeman labial moisturizer (Amazon,  Coconut or olive oil aloe   Things to avoid in the vaginal area Do not use things to irritate the vulvar area No lotions just specialized creams for the vulva area- Neogyn, V-magic, No soaps; can use Aveeno or Calendula cleanser if needed. Must be gentle No deodorants No douches Good to sleep without underwear to let the vaginal area to air out No scrubbing: spread the lips to let warm water rinse over labias and pat dry [facility] [address] [place], [place] [address] Phone # [phone] Fax [phone]

## 2021-11-27 NOTE — Telephone Encounter (Signed)
Oral Oncology Pharmacist Encounter  Received new prescription for neratinib (Nerlynx) for the treatment of HER2 positive breast cancer in conjunction with letrozole, planned duration for up to 1 year.  Labs from 11/22/21 (CBC, CMP) assessed, no interventions needed. Prescription dose and frequency assessed.  Patient will not start until the end of June and per MD note will take 2 tablets and slowly titrate up to 4 tablets.   Current medication list in Epic reviewed, DDIs with Nerlynx identified: none  Evaluated chart and no patient barriers to medication adherence noted.   Patient agreement for treatment documented in MD note on 11/22/2021.  Prescription has been e-scribed to the [facility] for benefits analysis and approval.  Oral Oncology Clinic will continue to follow for insurance authorization, copayment issues, initial counseling and start date.  Drema Halon, PharmD Hematology/Oncology Clinical Pharmacist [facility] [phone] 11/27/2021 8:23 AM

## 2021-12-01 DIAGNOSIS — N949 Unspecified condition associated with female genital organs and menstrual cycle: Secondary | ICD-10-CM | POA: Diagnosis not present

## 2021-12-01 DIAGNOSIS — R945 Abnormal results of liver function studies: Secondary | ICD-10-CM | POA: Diagnosis not present

## 2021-12-01 NOTE — Progress Notes (Signed)
Received VM from pt complaining of vaginal burning, states she was seen in urgent care for same.  Since this has been ongoing and UTI has been noted and treated too I followed up with Dr. Boyd Kerbs office.  I spoke with Tiffany, provided background and current workup including pt previous cultures and reports of inability to sleep at night due to vaginal pain.  Tiffany stated she would call the patient and follow up for further needs

## 2021-12-04 ENCOUNTER — Other Ambulatory Visit: Payer: BC Managed Care – PPO

## 2021-12-04 ENCOUNTER — Encounter: Payer: BC Managed Care – PPO | Admitting: Physical Therapy

## 2021-12-04 DIAGNOSIS — D696 Thrombocytopenia, unspecified: Secondary | ICD-10-CM | POA: Diagnosis not present

## 2021-12-04 DIAGNOSIS — R748 Abnormal levels of other serum enzymes: Secondary | ICD-10-CM | POA: Diagnosis not present

## 2021-12-04 DIAGNOSIS — N390 Urinary tract infection, site not specified: Secondary | ICD-10-CM | POA: Diagnosis not present

## 2021-12-04 NOTE — Therapy (Signed)
OUTPATIENT PHYSICAL THERAPY TREATMENT NOTE   Patient Name: Teresa Sheppard MRN: [id number] DOB:1961/04/21, 61 y.o., female Today's Date: 12/05/2021  PCP: Wenda Low, MD REFERRING PROVIDER:  Nicholas Lose, MD    END OF SESSION:   PT End of Session - 12/05/21 0851     Visit Number 3    Date for PT Re-Evaluation 01/23/22    Authorization Type BCBS    PT Start Time 0848    PT Stop Time 0928    PT Time Calculation (min) 40 min    Activity Tolerance Patient tolerated treatment well    Behavior During Therapy [facility] for tasks assessed/performed              Past Medical History:  Diagnosis Date   Anxiety    Breast cancer in female [facility])    Right   Family history of breast cancer 11/07/2020   Family history of pancreatic cancer 11/07/2020   History of kidney stones 2014-2015   Hyperthyroidism    No longer an issue   PONV (postoperative nausea and vomiting)    Thyromegaly    Past Surgical History:  Procedure Laterality Date   ABDOMINAL WALL DEFECT REPAIR     following hernia repair in 2010   [place]     2008  while pregnant with last child. Scar tissue from appendectomy wrapped around bowel and was snipped to free bowel.    CESAREAN SECTION     DILATION AND CURETTAGE OF UTERUS     2000 and 2014   HERNIA REPAIR     MASTECTOMY W/ SENTINEL NODE BIOPSY Right 11/28/2020   Procedure: RIGHT MASTECTOMY WITH RIGHT AXILLARY SENTINEL LYMPH NODE BIOPSY;  Surgeon: [name], MD;  Location: [place];  Service: General;  Laterality: Right;   PORT-A-CATH REMOVAL Left 01/30/2021   Procedure: PORT REMOVAL;  Surgeon: [name], MD;  Location: [place];  Service: General;  Laterality: Left;  LOCAL   PORTACATH PLACEMENT Left 07/12/2020   Procedure: INSERTION PORT-A-CATH WITH ULTRASOUND GUIDANCE;  Surgeon: [name], MD;  Location: [place];  Service: General;  Laterality: Left;   RADIOACTIVE SEED GUIDED AXILLARY SENTINEL LYMPH NODE Right 11/28/2020    Procedure: RADIOACTIVE SEED GUIDED RIGHT AXILLARY AXILLARY SENTINEL LYMPH NODE EXCISION;  Surgeon: [name], MD;  Location: [place];  Service: General;  Laterality: Right;   RADIOACTIVE SEED GUIDED EXCISIONAL BREAST BIOPSY Left 11/28/2020   Procedure: RADIOACTIVE SEED GUIDED LEFT EXCISIONAL BREAST BIOPSY;  Surgeon: [name], MD;  Location: [place];  Service: General;  Laterality: Left;   ROOT CANAL     2019   TONSILLECTOMY  1969   URETERAL EXPLORATION     dilataion for congenitally small ureter   Patient Active Problem List   Diagnosis Date Noted   Chemotherapy-induced peripheral neuropathy ([place]) 10/24/2021   Vitamin D deficiency 08/30/2021   Osteoporosis 08/30/2021   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   Genetic testing 11/15/2020   Family history of breast cancer 11/07/2020   Family history of pancreatic cancer 11/07/2020   Dysphagia 10/26/2020   Malignant neoplasm of overlapping sites of right breast in female, estrogen receptor positive ([place]) 06/21/2020   Hyperthyroidism 03/28/2016    REFERRING DIAG: C50.811,Z17.0 (ICD-10-CM) - Malignant neoplasm of overlapping sites of right breast in female, estrogen receptor positive ([place])  THERAPY DIAG:  Muscle weakness (generalized)  Other muscle spasm  Rationale for Evaluation and Treatment Rehabilitation  PERTINENT HISTORY: Hernia repair 2010, appendectomy, bowel resection, c-section, breast cancer,  chronic UTI  PRECAUTIONS: None  SUBJECTIVE: I used the cream for bacterial vaginosis and I think that was irritating. I was taking azo and that was feeling better. Now I have a UTI and I am burning.  I was using vitamin e and tried a couple other things but not sure.  PAIN:  Are you having pain? Yes NPRS scale: 3/10 Pain location: urethra Pain orientation: Medial  PAIN TYPE: burning Pain description: intermittent  Aggravating factors: not sure Relieving factors: not sure    OBJECTIVE: (objective measures  completed at initial evaluation unless otherwise dated)  PATIENT SURVEYS:      PFIQ-7 POPIQ = 19   COGNITION:            Overall cognitive status: Within functional limits for tasks assessed                              MUSCLE LENGTH: Hamstrings: Right 80 deg; Left 70 deg     LUMBAR SPECIAL TESTS:  ASLR - harder on left but a little easier with compression   FUNCTIONAL TESTS:    SLS trendelenburg Rt side; unsteady bil GAIT:   Comments: WFL   POSTURE:  Rt hip elevated in standing   LUMBARAROM/PROM   A/PROM A/PROM  10/31/2021  Flexion 75%  Extension    Right lateral flexion    Left lateral flexion    Right rotation    Left rotation     (Blank rows = not tested)   LE ROM:   Passive ROM Right 10/31/2021 Left 10/31/2021  Hip flexion 80% 90%  Hip extension      Hip abduction      Hip adduction      Hip internal rotation 50% 75%  Hip external rotation 50% 75%  Knee flexion      Knee extension      Ankle dorsiflexion      Ankle plantarflexion      Ankle inversion      Ankle eversion       (Blank rows = not tested)   LE MMT:   MMT Right 10/31/2021 Left 10/31/2021  Hip flexion      Hip extension      Hip abduction      Hip adduction      Hip internal rotation      Hip external rotation      Knee flexion      Knee extension      Ankle dorsiflexion      Ankle plantarflexion      Ankle inversion      Ankle eversion        PELVIC MMT:   MMT   10/31/2021  Vaginal 2/5 (weak squeeze, definite lift)  Internal Anal Sphincter    External Anal Sphincter    Puborectalis    Diastasis Recti    (Blank rows = not tested)         PALPATION:   General  tension on the Rt flank and slight side bend in standing                 External Perineal Exam descending perineal body                             Internal Pelvic Floor decreased closure and TTP Lt side more than Rt; difficulty holding contraction and fatigues quickly with reps; stiff  muscles   TONE: high    PROLAPSE: None observed - did not do bearing down   TODAY'S TREATMENT  Date: 12/05/21  Patient confirms identification and approves physical therapist to perform internal soft tissue work  Manual - fascial release to urethra and bladder internally and externally; externally around incision on right lower abdomen and central abdomen  Self care - f/u and educated on moisturizers; gave samples of good clean love  Date: 11/27/21 HEP established-see below  Manual - lumbar and thoracic paraspinals Trigger Point Dry-Needling  Treatment instructions: Expect mild to moderate muscle soreness. S/S of pneumothorax if dry needled over a lung field, and to seek immediate medical attention should they occur. Patient verbalized understanding of these instructions and education.  Patient Consent Given: Yes Education handout provided: Yes Muscles treated: T10-12 Electrical stimulation performed: No Parameters: N/A Treatment response/outcome: increased soft tissue lenght Exercise - child pose, threading, qped rotation Self care - f/u and educated on moisturizers; gave handout    EVAL-  info on vaginal moisturizer and sampel of v-magic     PATIENT EDUCATION:  Education details: sample given Person educated: Patient Education method: Explanation Education comprehension: verbalized understanding     HOME EXERCISE PROGRAM: Access Code: [email or web address] URL: [email or web address] Date: 11/27/2021 Prepared by: Jari Favre  Exercises - Child's Pose with Thread the Needle  - 1 x daily - 7 x weekly - 3 sets - 10 reps - Quadruped Full Range Thoracic Rotation with Reach  - 1 x daily - 7 x weekly - 3 sets - 10 reps  Patient Education - Trigger Point Dry Needling    ASSESSMENT:   CLINICAL IMPRESSION: Patient did well with MFR techniques and got good releases internally around bladder. External releases throughout lower abdomen as details above.  Pt will benefit from skilled PT to  continue to address improved soft tissue length for pain management.     OBJECTIVE IMPAIRMENTS decreased coordination, decreased endurance, decreased ROM, decreased strength, postural dysfunction, and pain.    ACTIVITY LIMITATIONS community activity and interpersonal relationships .    PERSONAL FACTORS 1-2 comorbidities: history of breast cancer, radiation, chemo, anti-estrogen medicine  are also affecting patient's functional outcome.      REHAB POTENTIAL: Excellent   CLINICAL DECISION MAKING: Evolving/moderate complexity   EVALUATION COMPLEXITY: Moderate     GOALS: Goals reviewed with patient? Yes   SHORT TERM GOALS: Target date: 11/28/2021   Ind with initial HEP Baseline: 11/27/21  Goal status: met 12/05/21  2.  Ind with moisturizer for vaginal canal and has found one that feels good for reducing chaffing. Baseline: 11/27/21  Goal status: ongoing       LONG TERM GOALS: Target date: 01/23/2022   Pt will be independent with advanced HEP to maintain improvements made throughout therapy   Baseline:  Goal status: INITIAL   2.  Pt will report 75% reduction of pain due to improvements in posture, strength, and muscle length   Baseline:  Goal status: INITIAL   3.  Pt will have POPIQ score improved by least 5 points. Baseline:  Goal status: INITIAL   4.  Pt will have nocturia decreased to 1-2/night at most   Baseline:  Goal status: INITIAL   5.  Pt will not have pain with voiding due to improved skin integrity and muscle tone Baseline:  Goal status: INITIAL       PLAN: PT FREQUENCY: 1x/week   PT DURATION: 12 weeks   PLANNED INTERVENTIONS: Therapeutic exercises, Therapeutic activity, Neuromuscular re-education,  Balance training, Gait training, Patient/Family education, Joint mobilization, Dry Needling, Electrical stimulation, Spinal mobilization, Cryotherapy, Moist heat, Taping, Biofeedback, and Manual therapy   PLAN FOR NEXT SESSION: add to breathing and  stretching; table down dog with kegel; f/u on internal STM if still having pain and bladder/kidney releases; DN #2 to low thoracic and maybe lumbar    Camillo Flaming Hannah Strader, PT 12/05/2021, 8:52 AM

## 2021-12-05 ENCOUNTER — Encounter: Payer: Self-pay | Admitting: Physical Therapy

## 2021-12-05 ENCOUNTER — Ambulatory Visit: Payer: BC Managed Care – PPO | Admitting: Physical Therapy

## 2021-12-05 DIAGNOSIS — M6281 Muscle weakness (generalized): Secondary | ICD-10-CM | POA: Diagnosis not present

## 2021-12-05 DIAGNOSIS — M62838 Other muscle spasm: Secondary | ICD-10-CM | POA: Diagnosis not present

## 2021-12-06 ENCOUNTER — Telehealth: Payer: Self-pay | Admitting: Hematology and Oncology

## 2021-12-06 ENCOUNTER — Inpatient Hospital Stay: Payer: BC Managed Care – PPO | Attending: Hematology and Oncology | Admitting: Hematology and Oncology

## 2021-12-06 DIAGNOSIS — C50811 Malignant neoplasm of overlapping sites of right female breast: Secondary | ICD-10-CM | POA: Diagnosis not present

## 2021-12-06 DIAGNOSIS — Z170 Estrogen receptor positive status [ER+]: Secondary | ICD-10-CM | POA: Diagnosis not present

## 2021-12-06 DIAGNOSIS — D696 Thrombocytopenia, unspecified: Secondary | ICD-10-CM | POA: Insufficient documentation

## 2021-12-06 DIAGNOSIS — Z923 Personal history of irradiation: Secondary | ICD-10-CM | POA: Insufficient documentation

## 2021-12-06 NOTE — Assessment & Plan Note (Signed)
06/21/2020:Patient palpated a right breast mass x 1 wk. Mammogram showed multiple confluent masses in the right breast at the 7 o'clock position measuring 2.3cm and at the 9 o'clock position measuring 2.2cm with one right axillary lymph node with cortical thickening. Biopsy showed invasive mammary carcinoma at both positions and in the axilla, grade 2-3, HER-2 equivocal by IHC (2+), positive by FISH (ratio 3.34), ER+ >95%, PR+ 25%, Ki67 60%.  Treatment plan: 1. Neoadjuvant chemotherapy with [place] Perjeta 6 cyclescompleted 5/4/2022followed by HerceptinPerjeta versus Kadcylamaintenance for 1 yearcompleted 05/23/2021 2.Right mastectomy [name]): IDC, grade 3, spanning 4.0cm fibrotic area, clear margins, 1/4 right axillary lymph nodes positive for carcinoma, 0.5cm. Left lumpectomy: no evidence of malignancy 3. Followed by adjuvant radiation therapycompleted 02/17/2021 4.Followed by adjuvant antiestrogen therapystarted 12/29/2020 5.Followed by neratinib(to be started July 2023) ---------------------------------------------------------------------------------------------------------------------------------- Current Treatment:letrozole   Letrozole toxicities: Tolerating it extremely well.  Surveillance:  1. [facility]: Benign Bone density: November 2022: T score -2.4: Recommended Prolia along with calcium and vitamin D. 2. Breast MRI: 11/17/2021: To my evaluation there does not appear to be any abnormalities.  We will await for the official radiology reading.  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

## 2021-12-06 NOTE — Telephone Encounter (Signed)
Scheduled appointment per 6/14 los. Patient is aware.

## 2021-12-06 NOTE — Progress Notes (Signed)
HEMATOLOGY-ONCOLOGY TELEPHONE VISIT PROGRESS NOTE  I connected with Arbie Cookey on 12/06/21 at 10:15 AM EDT by telephone and verified that I am speaking with the correct person using two identifiers.  I discussed the limitations, risks, security and privacy concerns of performing an evaluation and management service by telephone and the availability of in person appointments.  I also discussed with the patient that there may be a patient responsible charge related to this service. The patient expressed understanding and agreed to proceed.   History of Present Illness: Frequent UTIs.   Oncology History  Malignant neoplasm of overlapping sites of right breast in female, estrogen receptor positive ([place])  06/21/2020 Initial Diagnosis   Patient palpated a right breast mass x 1 wk. Mammogram showed multiple confluent masses in the right breast at the 7 o'clock position measuring 2.3cm and at the 9 o'clock position measuring 2.2cm with one right axillary lymph node with cortical thickening. Biopsy showed invasive mammary carcinoma at both positions and in the axilla, grade 2-3, HER-2 equivocal by IHC (2+), positive by FISH (ratio 3.34), ER+ >95%, PR+ 25%, Ki67 60%.   06/29/2020 Cancer Staging   Staging form: Breast, AJCC 8th Edition - Clinical stage from 06/29/2020: Stage IB (cT2, cN1(f), cM0, G3, ER+, PR+, HER2+) - Signed by Nicholas Lose, MD on 06/29/2020   07/13/2020 - 11/16/2020 Neo-Adjuvant Chemotherapy   Taxotere, Carbo, Herceptin, Perjeta x 6 followed by 1 cycle of herceptin/perjeta   10/28/2020 Breast MRI   Complete imaging response   11/14/2020 Genetic Testing   Negative hereditary cancer genetic testing: no pathogenic variants detected in [facility] BRCAPlus Panel and [facility] CancerNext-Expanded +RNAinsight Panel.  The report dates are Nov 14, 2020 and December 05, 2020, respectively.    The BRCAplus panel offered by [employer] and includes sequencing and deletion/duplication analysis for the following 8  genes: ATM, BRCA1, BRCA2, CDH1, CHEK2, PALB2, PTEN, and TP53.  The CancerNext-Expanded gene panel offered by [facility] and includes sequencing, rearrangement, and RNA analysis for the following 77 genes: AIP, ALK, APC, ATM, AXIN2, BAP1, BARD1, BLM, BMPR1A, BRCA1, BRCA2, BRIP1, CDC73, CDH1, CDK4, CDKN1B, CDKN2A, CHEK2, CTNNA1, DICER1, FANCC, FH, FLCN, GALNT12, KIF1B, LZTR1, MAX, MEN1, MET, MLH1, MSH2, MSH3, MSH6, MUTYH, NBN, NF1, NF2, NTHL1, PALB2, PHOX2B, PMS2, POT1, PRKAR1A, PTCH1, PTEN, RAD51C, RAD51D, RB1, RECQL, RET, SDHA, SDHAF2, SDHB, SDHC, SDHD, SMAD4, SMARCA4, SMARCB1, SMARCE1, STK11, SUFU, TMEM127, TP53, TSC1, TSC2, VHL and XRCC2 (sequencing and deletion/duplication); EGFR, EGLN1, HOXB13, KIT, MITF, PDGFRA, POLD1, and POLE (sequencing only); EPCAM and GREM1 (deletion/duplication only).    11/28/2020 Surgery   Right mastectomy [name]): IDC, grade 3, spanning 4.0cm fibrotic area, clear margins, 1/4 right axillary lymph nodes positive for carcinoma, 0.5cm.   Left lumpectomy: no evidence of malignancy   11/28/2020 Cancer Staging   Staging form: Breast, AJCC 8th Edition - Pathologic stage from 11/28/2020: No Stage Recommended (ypT2, pN1a, cM0) - Signed by Gardenia Phlegm, NP on 07/26/2021 Stage prefix: Post-therapy   12/07/2020 - 05/23/2021 Chemotherapy   Patient is on Treatment Plan : BREAST ADO-Trastuzumab Emtansine (Kadcyla) q21d     01/04/2021 - 02/17/2021 Radiation Therapy   Site Technique Total Dose (Gy) Dose per Fx (Gy) Completed Fx Beam Energies  Chest Wall, Right: CW_Rt 3D 50.4/50.4 1.8 28/28 6X  Chest Wall, Right: CW_Rt_SCLV 3D 50.4/50.4 1.8 28/28 6X, 10X  Chest Wall, Right: CW_Rt_Bst Electron 10/10 2 5/5 6E     12/2020 -  Anti-estrogen oral therapy   Letrozole daily     REVIEW OF SYSTEMS:  Constitutional: Denies fevers, chills or abnormal weight loss  All other systems were reviewed with the patient and are negative. Observations/Objective:     Assessment Plan:   Malignant neoplasm of overlapping sites of right breast in female, estrogen receptor positive ([place]) 06/21/2020:Patient palpated a right breast mass x 1 wk. Mammogram showed multiple confluent masses in the right breast at the 7 o'clock position measuring 2.3cm and at the 9 o'clock position measuring 2.2cm with one right axillary lymph node with cortical thickening. Biopsy showed invasive mammary carcinoma at both positions and in the axilla, grade 2-3, HER-2 equivocal by IHC (2+), positive by FISH (ratio 3.34), ER+ >95%, PR+ 25%, Ki67 60%.   Treatment plan: 1. Neoadjuvant chemotherapy with [place] Perjeta 6 cycles completed 10/26/2020 followed by Herceptin Perjeta versus [name] for 1 year completed 05/23/2021 2.  Right mastectomy [name]): IDC, grade 3, spanning 4.0cm fibrotic area, clear margins, 1/4 right axillary lymph nodes positive for carcinoma, 0.5cm. Left lumpectomy: no evidence of malignancy 3. Followed by adjuvant radiation therapy completed 02/17/2021 4.  Followed by adjuvant antiestrogen therapy started 12/29/2020 5.  Followed by neratinib (to be started July 2023) ---------------------------------------------------------------------------------------------------------------------------------- Current Treatment:letrozole    Letrozole toxicities: Tolerating it extremely well.    Surveillance:  1. Mammograms June 08, 2021 at [facility]: Benign Bone density: November 2022: T score -2.4: Recommended Prolia along with calcium and vitamin D. 2. Breast MRI: 11/17/2021: To my evaluation there does not appear to be any abnormalities.  We will await for the official radiology reading.  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 We considered a bone scan but decided not to do it.   I discussed  the assessment and treatment plan with the patient. The patient was provided an opportunity to ask questions and all were answered. The patient agreed with the plan and demonstrated an understanding of the instructions. The patient was advised to call back or seek an in-person evaluation if the symptoms worsen or if the condition fails to improve as anticipated.   I provided 12 minutes of non-face-to-face time during this encounter. Harriette Ohara, MD

## 2021-12-08 DIAGNOSIS — R3914 Feeling of incomplete bladder emptying: Secondary | ICD-10-CM | POA: Diagnosis not present

## 2021-12-08 DIAGNOSIS — R350 Frequency of micturition: Secondary | ICD-10-CM | POA: Diagnosis not present

## 2021-12-08 DIAGNOSIS — N390 Urinary tract infection, site not specified: Secondary | ICD-10-CM | POA: Diagnosis not present

## 2021-12-08 DIAGNOSIS — B952 Enterococcus as the cause of diseases classified elsewhere: Secondary | ICD-10-CM | POA: Diagnosis not present

## 2021-12-08 DIAGNOSIS — R351 Nocturia: Secondary | ICD-10-CM | POA: Diagnosis not present

## 2021-12-12 ENCOUNTER — Encounter: Payer: Self-pay | Admitting: Hematology and Oncology

## 2021-12-13 ENCOUNTER — Telehealth: Payer: Self-pay | Admitting: Hematology and Oncology

## 2021-12-13 NOTE — Telephone Encounter (Signed)
Contacted Gilford Rile in regards to following up with this patient due to meds she has been taking and having several questions. John was contacted via staff message (12/13/21)

## 2021-12-14 ENCOUNTER — Other Ambulatory Visit (HOSPITAL_COMMUNITY): Payer: Self-pay

## 2021-12-14 NOTE — Telephone Encounter (Signed)
Oral Chemotherapy Pharmacist Encounter  I spoke with patient for overview of: Nerlynx (neratinib) for the maintenance adjuvant treatment of Her-2 receptor positive, hormone receptor-positive breast cancer, after completion of Herceptin, planned duration one year.  Patient has recurrent UTIs and is currently on antibiotics for one week. Notified MD and MD okay with patient starting nerlynx 1 week after stopping antibiotics. Patient stated taht urology may be starting her on a prophylactic antibiotic, trimethoprim. Notified patient   Counseled patient on administration, dosing, side effects, monitoring, drug-food interactions, safe handling, storage, and disposal.  Prescribing information for Nerlynx recommends dose initiation at full dose, 6 tablets (240 mg) once daily, with the use of scheduled loperamide antidiarrheal prophylaxis for the first 8 weeks of therapy.  Nerlynx will be initiated on a dose titration schedule. Antidiarrheal prophylaxis will not be used.   Patient will use loperamide as needed if symptoms of diarrhea occur.   Days 1-3: Patient will take Nerlynx 56m tablets, 1 tablet (435m by mouth once daily with food.  Then she will continue into a weekly titration as long as patient tolerates.  Week 1: Patient will take Nerlynx 4061mablets, 2 tablets (1m58my mouth once daily with food Week 2: Patient will take Nerlynx 40mg46mlets, 3 tablets (120mg)37mmouth once daily with food Week 3: Patient will take Nerlynx 40mg t29mts, 4 tablets (200mg) b63muth once daily with food.   Patient will be following up with John, phJenny Reichmanncist, on 7/10 and beyond for further titration.   240 mg once daily is the target dose of Nerlynx.  Patient knows to avoid grapefruit and grapefruit juice while on treatment with Nerlynx.  Patient instructed to avoid use of PPIs while on treatment with Nerlynx, can separate antacids from Nerlynx by 3 hours if acid suppression is needed and H2RAs to be  separated to take the neratinib at least 2 hours before H2RAs or 10 hours after the last dose of H2RAs.  Nerlynx start date: 12/28/2021  Adverse effects include but are not limited to: diarrhea, nausea, vomiting, mouth sores, fatigue, rash, and abdominal pain.    Patient instructed to increase or decrease loperamide dose to maintain 1-2 bowel movements per day.  Reviewed with patient importance of keeping a medication schedule and plan for any missed doses. No barriers to medication adherence identified.  Medication reconciliation performed and medication/allergy list updated.  Insurance authorization for Nerlynx has been obtained. Test claim at the pharmacy revealed copayment $10 for 1st fill of 15 days. This will ship from the [place] [facility] [place] to deliver to patient's home on 12/19/21.  Patient informed the pharmacy will reach out 5-7 days prior to needing next fill of Nerlynx to coordinate continued medication acquisition to prevent break in therapy.  All questions answered.  Mrs. Livingstone [name] and appreciation.   Medication education handout placed in mail for patient. Patient knows to call the office with questions or concerns. Oral Chemotherapy Clinic phone number provided to patient.   Teresa Sheppard Drema Halon Hematology/Oncology Clinical Pharmacist [facility]-[phone]   9:04 AM

## 2021-12-15 ENCOUNTER — Other Ambulatory Visit (HOSPITAL_COMMUNITY): Payer: Self-pay

## 2021-12-15 ENCOUNTER — Encounter: Payer: Self-pay | Admitting: Adult Health

## 2021-12-15 ENCOUNTER — Encounter: Payer: Self-pay | Admitting: Hematology and Oncology

## 2021-12-15 DIAGNOSIS — N3020 Other chronic cystitis without hematuria: Secondary | ICD-10-CM | POA: Diagnosis not present

## 2021-12-17 ENCOUNTER — Encounter: Payer: Self-pay | Admitting: Hematology and Oncology

## 2021-12-17 ENCOUNTER — Encounter: Payer: Self-pay | Admitting: Physician Assistant

## 2021-12-18 ENCOUNTER — Inpatient Hospital Stay (HOSPITAL_BASED_OUTPATIENT_CLINIC_OR_DEPARTMENT_OTHER): Payer: BC Managed Care – PPO | Admitting: Physician Assistant

## 2021-12-18 ENCOUNTER — Other Ambulatory Visit: Payer: Self-pay

## 2021-12-18 ENCOUNTER — Other Ambulatory Visit (HOSPITAL_COMMUNITY): Payer: Self-pay

## 2021-12-18 ENCOUNTER — Inpatient Hospital Stay: Payer: BC Managed Care – PPO

## 2021-12-18 VITALS — BP 117/77 | HR 60 | Temp 97.5°F | Resp 15 | Wt 146.3 lb

## 2021-12-18 DIAGNOSIS — Z923 Personal history of irradiation: Secondary | ICD-10-CM | POA: Diagnosis not present

## 2021-12-18 DIAGNOSIS — Z170 Estrogen receptor positive status [ER+]: Secondary | ICD-10-CM

## 2021-12-18 DIAGNOSIS — D696 Thrombocytopenia, unspecified: Secondary | ICD-10-CM | POA: Diagnosis not present

## 2021-12-18 DIAGNOSIS — C50811 Malignant neoplasm of overlapping sites of right female breast: Secondary | ICD-10-CM | POA: Diagnosis not present

## 2021-12-18 LAB — CBC WITH DIFFERENTIAL (CANCER CENTER ONLY)
Abs Immature Granulocytes: 0.01 10*3/uL (ref 0.00–0.07)
Basophils Absolute: 0 10*3/uL (ref 0.0–0.1)
Basophils Relative: 1 %
Eosinophils Absolute: 0.4 10*3/uL (ref 0.0–0.5)
Eosinophils Relative: 9 %
HCT: 38.7 % (ref 36.0–46.0)
Hemoglobin: 12.9 g/dL (ref 12.0–15.0)
Immature Granulocytes: 0 %
Lymphocytes Relative: 9 %
Lymphs Abs: 0.4 10*3/uL — ABNORMAL LOW (ref 0.7–4.0)
MCH: 31.7 pg (ref 26.0–34.0)
MCHC: 33.3 g/dL (ref 30.0–36.0)
MCV: 95.1 fL (ref 80.0–100.0)
Monocytes Absolute: 0.3 10*3/uL (ref 0.1–1.0)
Monocytes Relative: 8 %
Neutro Abs: 3 10*3/uL (ref 1.7–7.7)
Neutrophils Relative %: 73 %
Platelet Count: 131 10*3/uL — ABNORMAL LOW (ref 150–400)
RBC: 4.07 MIL/uL (ref 3.87–5.11)
RDW: 12.3 % (ref 11.5–15.5)
WBC Count: 4.1 10*3/uL (ref 4.0–10.5)
nRBC: 0 % (ref 0.0–0.2)

## 2021-12-18 LAB — CMP (CANCER CENTER ONLY)
ALT: 106 U/L — ABNORMAL HIGH (ref 0–44)
AST: 88 U/L — ABNORMAL HIGH (ref 15–41)
Albumin: 4.1 g/dL (ref 3.5–5.0)
Alkaline Phosphatase: 210 U/L — ABNORMAL HIGH (ref 38–126)
Anion gap: 6 (ref 5–15)
BUN: 13 mg/dL (ref 6–20)
CO2: 28 mmol/L (ref 22–32)
Calcium: 9.5 mg/dL (ref 8.9–10.3)
Chloride: 105 mmol/L (ref 98–111)
Creatinine: 0.75 mg/dL (ref 0.44–1.00)
GFR, Estimated: >60 mL/min (ref >60)
Glucose, Bld: 125 mg/dL — ABNORMAL HIGH (ref 70–99)
Potassium: 4 mmol/L (ref 3.5–5.1)
Sodium: 139 mmol/L (ref 135–145)
Total Bilirubin: 0.7 mg/dL (ref 0.3–1.2)
Total Protein: 7.5 g/dL (ref 6.5–8.1)

## 2021-12-19 ENCOUNTER — Other Ambulatory Visit (HOSPITAL_COMMUNITY): Payer: Self-pay

## 2021-12-20 ENCOUNTER — Encounter: Payer: Self-pay | Admitting: Physician Assistant

## 2021-12-20 ENCOUNTER — Other Ambulatory Visit (HOSPITAL_COMMUNITY): Payer: Self-pay

## 2021-12-24 ENCOUNTER — Encounter (HOSPITAL_BASED_OUTPATIENT_CLINIC_OR_DEPARTMENT_OTHER): Payer: Self-pay | Admitting: Obstetrics and Gynecology

## 2021-12-24 ENCOUNTER — Emergency Department (HOSPITAL_BASED_OUTPATIENT_CLINIC_OR_DEPARTMENT_OTHER)
Admission: EM | Admit: 2021-12-24 | Discharge: 2021-12-25 | Disposition: A | Discharge status: Home / Self Care | Payer: BC Managed Care – PPO | Attending: Emergency Medicine | Admitting: Emergency Medicine

## 2021-12-24 ENCOUNTER — Other Ambulatory Visit: Payer: Self-pay

## 2021-12-24 DIAGNOSIS — R1310 Dysphagia, unspecified: Secondary | ICD-10-CM | POA: Diagnosis not present

## 2021-12-24 MED ORDER — DIAZEPAM 2 MG PO TABS: 2.0000 mg | ORAL_TABLET | Freq: Three times a day (TID) | ORAL | 0 refills | Status: DC | PRN

## 2021-12-24 MED ORDER — LORAZEPAM 2 MG/ML IJ SOLN
0.5000 mg | Freq: Once | INTRAMUSCULAR | Status: AC
  Administered 2021-12-24: 0.5 mg via INTRAVENOUS
  Filled 2021-12-24: qty 1

## 2021-12-24 MED ORDER — GLUCAGON HCL RDNA (DIAGNOSTIC) 1 MG IJ SOLR
1.0000 mg | Freq: Once | INTRAMUSCULAR | Status: AC
  Administered 2021-12-24: 1 mg via INTRAVENOUS
  Filled 2021-12-24: qty 1

## 2021-12-24 NOTE — ED Notes (Signed)
Difficulty swallowing since 12 n today Able to tolerate her secretions and swallow fluids but nothing solid

## 2021-12-24 NOTE — ED Provider Notes (Signed)
[facility] Provider Note   CSN: [id number] Arrival date & time: 12/24/21  2013     History  Chief Complaint  Patient presents with   Dysphagia    Teresa Sheppard is a 61 y.o. female presenting to ED with complaint of dysphagia.  The patient reports that she tried to swallow a pill today which was an antibiotic, felt that it was briefly stuck, and that it is gone down but since then she has been having difficulty swallowing.  She says she feels some spasms in her throat when she swallows.  She has been able to drink water but anything solid, including applesauce, feels that she is choking and needs to bring her back up.  She has had this issue in the past but never needed to come to the ER.  She has not seen gastroenterology before.  HPI     Home Medications Prior to Admission medications   Medication Sig Start Date End Date Taking? Authorizing Provider  diazepam (VALIUM) 2 MG tablet Take 1 tablet (2 mg total) by mouth every 8 (eight) hours as needed for up to 10 doses for muscle spasms (Esophageal spasm). 12/24/21  Yes Lillion Elbert, Carola Rhine, MD  acetaminophen (TYLENOL) 325 MG tablet Take 325 mg by mouth every 6 (six) hours as needed (headaches / pain).    [provider]  Calcium-Vitamin D-Vitamin K ([employer] CALCIUM SOFT CHEWS) 650-12.5-40 MG-MCG-MCG CHEW Chew 1 tablet by mouth 2 (two) times daily.    [provider]  cholecalciferol (VITAMIN D3) 25 MCG (1000 UNIT) tablet Take 1,000 Units by mouth daily.    [provider]  ciclopirox (PENLAC) 8 % solution Apply topically at bedtime. Apply over nail and surrounding skin. Apply daily over previous coat. After seven (7) days, may remove with alcohol and continue cycle. 07/26/21   Gardenia Phlegm, NP  estradiol (ESTRACE VAGINAL) 0.1 MG/GM vaginal cream Place 1 Applicatorful vaginally at bedtime. 11/22/21   Nicholas Lose, MD  hydrocortisone cream 1 % Apply 1 application topically 4 (four)  times daily as needed for itching.    [provider]  letrozole (FEMARA) 2.5 MG tablet Take 1 tablet (2.5 mg total) by mouth daily. 03/06/21   Nicholas Lose, MD  Multiple Vitamins-Minerals (HAIR/SKIN/NAILS/BIOTIN PO) Take 1 capsule by mouth daily.    [provider]  naproxen (NAPROSYN) 500 MG tablet Take 500 mg by mouth 2 (two) times daily as needed. 06/19/21   [provider]  Neratinib Maleate (NERLYNX) 40 MG tablet Take 4 tablets (160 mg total) by mouth daily. Take with food. 11/22/21   Nicholas Lose, MD  Polyethyl Glycol-Propyl Glycol (SYSTANE OP) Place 1 drop into both eyes daily as needed (dry eyes).    [provider]      Allergies    Shellfish-derived products, Ciprofibrate, Oxycodone hcl, Shrimp extract allergy skin test, Hydrocodone, and Penicillins    Review of Systems   Review of Systems  Physical Exam Updated Vital Signs BP 112/66 (BP Location: Right Arm)   Pulse 64   Temp 98.3 F (36.8 C) (Oral)   Resp 16   Ht '5\' 10"'$  (1.778 m)   Wt 64.4 kg   SpO2 99%   BMI 20.37 kg/m  Physical Exam Constitutional:      General: She is not in acute distress. HENT:     Head: Normocephalic and atraumatic.  Eyes:     Conjunctiva/sclera: Conjunctivae normal.     Pupils: Pupils are equal, round, and  reactive to light.  Cardiovascular:     Rate and Rhythm: Normal rate and regular rhythm.  Pulmonary:     Effort: Pulmonary effort is normal. No respiratory distress.  Abdominal:     General: There is no distension.     Tenderness: There is no abdominal tenderness.  Skin:    General: Skin is warm and dry.  Neurological:     General: No focal deficit present.     Mental Status: She is alert. Mental status is at baseline.  Psychiatric:        Mood and Affect: Mood normal.        Behavior: Behavior normal.     ED Results / Procedures / Treatments   Labs (all labs ordered are listed, but only abnormal results are displayed) Labs Reviewed - No  data to display  EKG None  Radiology No results found.  Procedures Procedures    Medications Ordered in ED Medications  LORazepam (ATIVAN) injection 0.5 mg (0.5 mg Intravenous Given 12/24/21 2227)  glucagon (human recombinant) (GLUCAGEN) injection 1 mg (1 mg Intravenous Given 12/24/21 2226)  LORazepam (ATIVAN) injection 0.5 mg (0.5 mg Intravenous Given 12/24/21 2339)    ED Course/ Medical Decision Making/ A&P Clinical Course as of 12/25/21 1002  Sun Dec 24, 2021  2329 Reassessment patient again was able to tolerate and swallow water, but felt afterwards that she needed to belch or had an upset stomach.  We will give her an additional 0.5 mg of Ativan, continue to monitor.  I do suspect esophageal spasm, I did prescribe Valium which may be helpful as a smooth muscle relaxer at home, and referred to GI [MT]    Clinical Course User Index [MT] Byron Peacock, Carola Rhine, MD                           Medical Decision Making Risk Prescription drug management.   Patient is here with dysphagia after swallowing pill.  She is tolerating her secretions easily in the room.  Airway is patent.  She was able to take several sips of water in front of me, keep them down, but does feel some discomfort.  I suspect she is having some esophageal spasms.  We can try Ativan and glucagon and will reassess afterwards.  I have a lower suspicion for retained or impacted foreign body of the esophagus.  Pill likely would have gone down on its own.  She will need a referral to gastroenterology as this is a recurring issue for her.  She verbalized understanding.  Pt signed out to Dr Tyrone Nine EDP pending reassessment of swallowing after 2nd round of ativan.        Final Clinical Impression(s) / ED Diagnoses Final diagnoses:  Dysphagia, unspecified type    Rx / DC Orders ED Discharge Orders          Ordered    Ambulatory referral to Gastroenterology       Comments: Dysphagia   12/24/21 2318    diazepam (VALIUM)  2 MG tablet  Every 8 hours PRN        12/24/21 2328              Wyvonnia Dusky, MD 12/25/21 1003

## 2021-12-24 NOTE — ED Triage Notes (Signed)
Patient reports to the ER for painful and difficulty swallowing after taking Primetherin today. Patient reports a hx of difficulty swallowing but never to this extent. Patient reports this happened at noon and she is still having pain with swallowing. Patient reports she is able to currently swallow water and her own secretions. Patient has not been able to comfortably eat solid foods since noon.

## 2021-12-25 DIAGNOSIS — N390 Urinary tract infection, site not specified: Secondary | ICD-10-CM | POA: Diagnosis not present

## 2021-12-25 NOTE — ED Notes (Signed)
Pt was able to drink water without any difficulty or complaints. Pt stated she felt ready to go home. Provider was made aware of pt's request.

## 2021-12-25 NOTE — ED Provider Notes (Signed)
Received patient in signout from Dr. Langston Masker, briefly the patient is a 61 year old female with a chief complaint of difficulty swallowing after not fully swallowing an antibiotic.  Plan to reassess after dose of Valium here.  Patient feeling a bit better and would like to go home.   Deno Etienne, DO 12/25/21 [phone]

## 2021-12-27 ENCOUNTER — Encounter: Payer: Self-pay | Admitting: Hematology and Oncology

## 2021-12-27 DIAGNOSIS — Z01419 Encounter for gynecological examination (general) (routine) without abnormal findings: Secondary | ICD-10-CM | POA: Diagnosis not present

## 2021-12-27 DIAGNOSIS — M8580 Other specified disorders of bone density and structure, unspecified site: Secondary | ICD-10-CM | POA: Diagnosis not present

## 2021-12-27 DIAGNOSIS — N390 Urinary tract infection, site not specified: Secondary | ICD-10-CM | POA: Diagnosis not present

## 2021-12-27 DIAGNOSIS — N952 Postmenopausal atrophic vaginitis: Secondary | ICD-10-CM | POA: Diagnosis not present

## 2021-12-27 DIAGNOSIS — E559 Vitamin D deficiency, unspecified: Secondary | ICD-10-CM | POA: Diagnosis not present

## 2021-12-27 DIAGNOSIS — C50919 Malignant neoplasm of unspecified site of unspecified female breast: Secondary | ICD-10-CM | POA: Diagnosis not present

## 2021-12-29 ENCOUNTER — Ambulatory Visit: Payer: BC Managed Care – PPO | Admitting: Physical Therapy

## 2021-12-29 DIAGNOSIS — Z170 Estrogen receptor positive status [ER+]: Secondary | ICD-10-CM | POA: Diagnosis not present

## 2021-12-29 DIAGNOSIS — C50811 Malignant neoplasm of overlapping sites of right female breast: Secondary | ICD-10-CM | POA: Diagnosis not present

## 2022-01-01 ENCOUNTER — Inpatient Hospital Stay: Payer: BC Managed Care – PPO | Attending: Hematology and Oncology

## 2022-01-01 ENCOUNTER — Inpatient Hospital Stay: Payer: BC Managed Care – PPO | Admitting: Pharmacist

## 2022-01-01 ENCOUNTER — Other Ambulatory Visit: Payer: Self-pay

## 2022-01-01 VITALS — BP 110/68 | HR 52 | Temp 97.7°F | Resp 18 | Ht 70.0 in | Wt 143.3 lb

## 2022-01-01 DIAGNOSIS — C50919 Malignant neoplasm of unspecified site of unspecified female breast: Secondary | ICD-10-CM | POA: Diagnosis not present

## 2022-01-01 DIAGNOSIS — Z170 Estrogen receptor positive status [ER+]: Secondary | ICD-10-CM

## 2022-01-01 LAB — CBC WITH DIFFERENTIAL/PLATELET
Abs Immature Granulocytes: 0 10*3/uL (ref 0.00–0.07)
Basophils Absolute: 0.1 10*3/uL (ref 0.0–0.1)
Basophils Relative: 1 %
Eosinophils Absolute: 0.1 10*3/uL (ref 0.0–0.5)
Eosinophils Relative: 3 %
HCT: 37.2 % (ref 36.0–46.0)
Hemoglobin: 12.7 g/dL (ref 12.0–15.0)
Immature Granulocytes: 0 %
Lymphocytes Relative: 25 %
Lymphs Abs: 0.9 10*3/uL (ref 0.7–4.0)
MCH: 32.3 pg (ref 26.0–34.0)
MCHC: 34.1 g/dL (ref 30.0–36.0)
MCV: 94.7 fL (ref 80.0–100.0)
Monocytes Absolute: 0.4 10*3/uL (ref 0.1–1.0)
Monocytes Relative: 10 %
Neutro Abs: 2.2 10*3/uL (ref 1.7–7.7)
Neutrophils Relative %: 61 %
Platelets: 144 10*3/uL — ABNORMAL LOW (ref 150–400)
RBC: 3.93 MIL/uL (ref 3.87–5.11)
RDW: 12.1 % (ref 11.5–15.5)
WBC: 3.7 10*3/uL — ABNORMAL LOW (ref 4.0–10.5)
nRBC: 0 % (ref 0.0–0.2)

## 2022-01-01 LAB — CMP (CANCER CENTER ONLY)
ALT: 36 U/L (ref 0–44)
AST: 44 U/L — ABNORMAL HIGH (ref 15–41)
Albumin: 4.3 g/dL (ref 3.5–5.0)
Alkaline Phosphatase: 146 U/L — ABNORMAL HIGH (ref 38–126)
Anion gap: 5 (ref 5–15)
BUN: 17 mg/dL (ref 6–20)
CO2: 30 mmol/L (ref 22–32)
Calcium: 9.9 mg/dL (ref 8.9–10.3)
Chloride: 104 mmol/L (ref 98–111)
Creatinine: 0.98 mg/dL (ref 0.44–1.00)
GFR, Estimated: >60 mL/min (ref >60)
Glucose, Bld: 81 mg/dL (ref 70–99)
Potassium: 4 mmol/L (ref 3.5–5.1)
Sodium: 139 mmol/L (ref 135–145)
Total Bilirubin: 0.6 mg/dL (ref 0.3–1.2)
Total Protein: 7.8 g/dL (ref 6.5–8.1)

## 2022-01-01 NOTE — Progress Notes (Signed)
[facility]       Telephone: [phone]?Fax: [phone]   Oncology Clinical Pharmacist Practitioner Initial Assessment  Teresa Sheppard is a 61 y.o. female with a diagnosis of breast cancer. They were contacted today via in-person visit.  Indication/Regimen Neratinib (Nerlynx) is being used appropriately for treatment of breast cancer by Dr. Nicholas Lose.      Wt Readings from Last 1 Encounters:  12/24/21 142 lb (64.4 kg)    Estimated body surface area is 1.78 meters squared as calculated from the following:   Height as of 12/24/21: '5\' 10"'$  (1.778 m).   Weight as of 12/24/21: 142 lb (64.4 kg).  The dosing regimen is 6 tablets (240 mg) by mouth daily on days 1 to 28 of a 28-day cycle. Per [name], the dose of neratinib will be titrated up to the max tolerable dose.  Patient will be starting 2 tablets this evening and increasing every 2 weeks per her preference.  She is also starting an antibiotic trimethoprim per her urologist and she will start this in 1 week. It is planned to continue until 1 year of therapy in the extended adjuvant setting.  Teresa Sheppard was seen today by clinical pharmacy to establish care after being referred by [name] for her neratinib management.  She is accompanied by her husband.  She started taking neratinib on 12/28/21 but at that time was only taking 1 tablet/day.  She prefers to titrate the dose slowly and will be starting 2 tablets this evening and increasing the dose to 3 tablets in  2 weeks per her preference.  This is because she is also starting an antibiotic trimethoprim per her urologist and she will start this in 1 week. If tolerated, she continue to increase the dose of neratinib by 1 tablet/week and see [name] again in 4 weeks from today.  He knows that she can come in sooner if needed with any questions or concerns.  We also gave her the 24/7 triage line at [phone].  Today we went over possible side effects of neratinib  which include but are not limited to nausea, vomiting, fatigue, headache, diarrhea, and LFT elevations.  We also went over storage and handling of the medication and what to do should she miss a dose.  We also discussed that she should space out her calcium supplement by at least 3 hours from her neratinib prescription.  Dose Modifications Per [name], he prefers to do a dose titration.  Patient will be starting 2 tablets this evening and increase by 1 tablet in 2 weeks.  This is the patient's preference since she will also be starting trimethoprim per her urologist in 1 week.  She will then increase the dose of neratinib by 1 tablet/week and see [name] again in 4 weeks from today.  Access Assessment Teresa Sheppard will be receiving neratinib through [facility] Insurance Concerns: None Start date if known: 12/28/21  Allergies Allergies  Allergen Reactions   Shellfish-Derived Products Anaphylaxis    Per patient whenever she consumes a lot gives tightness of throat   Ciprofibrate Other (See Comments)    Joint pain    Oxycodone Hcl    Shrimp Extract Allergy Skin Test    Hydrocodone Anxiety and Nausea Only   Penicillins Rash    Vitals    12/25/2021   12:40 AM 12/24/2021   11:00 PM 12/24/2021   10:30 PM  Vitals with BMI  Systolic  833 825 053  Diastolic 66 67 37  Pulse 64 66 60     Laboratory Data    Latest Ref Rng & Units 01/01/2022    1:18 PM 12/18/2021   10:43 AM 11/22/2021   10:01 AM  CBC EXTENDED  WBC 4.0 - 10.5 K/uL 3.7  4.1  3.0   RBC 3.87 - 5.11 MIL/uL 3.93  4.07  4.01   Hemoglobin 12.0 - 15.0 g/dL 12.7  12.9  12.8   HCT 36.0 - 46.0 % 37.2  38.7  37.9   Platelets 150 - 400 K/uL 144  131  107   NEUT# 1.7 - 7.7 K/uL 2.2  3.0  1.7   Lymph# 0.7 - 4.0 K/uL 0.9  0.4  0.9        Latest Ref Rng & Units 12/18/2021   10:43 AM 11/22/2021   10:01 AM 11/08/2021   10:45 AM  CMP  Glucose 70 - 99 mg/dL 125  85  87   BUN 6 - 20 mg/dL '13  13  12   '$ Creatinine  0.44 - 1.00 mg/dL 0.75  0.76  0.76   Sodium 135 - 145 mmol/L 139  139  140   Potassium 3.5 - 5.1 mmol/L 4.0  3.5  3.9   Chloride 98 - 111 mmol/L 105  103  105   CO2 22 - 32 mmol/L '28  30  28   '$ Calcium 8.9 - 10.3 mg/dL 9.5  9.9  9.7   Total Protein 6.5 - 8.1 g/dL 7.5  7.7  7.8   Total Bilirubin 0.3 - 1.2 mg/dL 0.7  0.6  0.6   Alkaline Phos 38 - 126 U/L 210  139  187   AST 15 - 41 U/L 88  41  63   ALT 0 - 44 U/L 106  33  55    Lab Results  Component Value Date   MG 2.1 03/07/2021     Contraindications Contraindications were reviewed?  Yes Contraindications to therapy were identified?  No  Safety Precautions The following safety precautions for the use of neratinib were reviewed:  Diarrhea: has loperamide on hand and reviewed as needed instructions Liver toxicity: reviewed possible side effects such as RUQ pain, yellowing of skin/eyes Drug interactions: PPIs (avoid), H2RAs, antacids discussed how to space these agents out from neratinib Nausea / Vomiting: has ondansetron and prochlorperazine on hand  Medication Reconciliation Current Outpatient Medications  Medication Sig Dispense Refill   acetaminophen (TYLENOL) 325 MG tablet Take 325 mg by mouth every 6 (six) hours as needed (headaches / pain).     Calcium-Vitamin D-Vitamin K ([employer] CALCIUM SOFT CHEWS) 650-12.5-40 MG-MCG-MCG CHEW Chew 1 tablet by mouth 2 (two) times daily.     cholecalciferol (VITAMIN D3) 25 MCG (1000 UNIT) tablet Take 1,000 Units by mouth daily.     ciclopirox (PENLAC) 8 % solution Apply topically at bedtime. Apply over nail and surrounding skin. Apply daily over previous coat. After seven (7) days, may remove with alcohol and continue cycle. 6.6 mL 0   diazepam (VALIUM) 2 MG tablet Take 1 tablet (2 mg total) by mouth every 8 (eight) hours as needed for up to 10 doses for muscle spasms (Esophageal spasm). 10 tablet 0   estradiol (ESTRACE VAGINAL) 0.1 MG/GM vaginal cream Place 1 Applicatorful vaginally at bedtime.  42.5 g 12   hydrocortisone cream 1 % Apply 1 application topically 4 (four) times daily as needed for itching.     letrozole (FEMARA) 2.5 MG  tablet Take 1 tablet (2.5 mg total) by mouth daily. 90 tablet 3   Multiple Vitamins-Minerals (HAIR/SKIN/NAILS/BIOTIN PO) Take 1 capsule by mouth daily.     naproxen (NAPROSYN) 500 MG tablet Take 500 mg by mouth 2 (two) times daily as needed.     Neratinib Maleate (NERLYNX) 40 MG tablet Take 4 tablets (160 mg total) by mouth daily. Take with food. 120 tablet 1   Polyethyl Glycol-Propyl Glycol (SYSTANE OP) Place 1 drop into both eyes daily as needed (dry eyes).     No current facility-administered medications for this visit.    Medication reconciliation is based on the patient's most recent medication list in the electronic medical record (EMR) including herbal products and OTC medications.   The patient's medication list was reviewed today with the patient?  Yes  Drug-drug interactions (DDIs) DDIs were evaluated?  Yes Significant DDIs identified?  Yes, as above, recommended to space out calcium supplement and neratinib by at least 3 hours per manufacturing guidelines.  Drug-Food Interactions Drug-food interactions were evaluated?  Yes Significant Drug-food interactions identified?  Yes, patient should avoid grapefruit juice.  Follow-up Plan  Continue to dose titrate neratinib as discussed above Continue letrozole 2.5 mg by mouth daily Use loperamide as needed Labs, [name] visit, in 4 weeks Labs, pharmacy clinic visit, in 8 weeks Manufacturing guidelines recommend labs monthly for 3 months then every 3 months thereafter or as clinically indicated  Teresa Sheppard participated in the discussion, expressed understanding, and voiced agreement with the above plan. All questions were answered to her satisfaction. The patient was advised to contact the clinic at [phone] with any questions or concerns prior to her return visit.   I spent 60  minutes assessing the patient.  Raina Mina, RPH-CPP, 01/01/2022 1:36 PM  **Disclaimer: This note was dictated with voice recognition software. Similar sounding words can inadvertently be transcribed and this note may contain transcription errors which may not have been corrected upon publication of note.**

## 2022-01-02 ENCOUNTER — Other Ambulatory Visit (HOSPITAL_COMMUNITY): Payer: Self-pay

## 2022-01-03 ENCOUNTER — Encounter: Payer: Self-pay | Admitting: Physical Therapy

## 2022-01-03 ENCOUNTER — Ambulatory Visit: Payer: BC Managed Care – PPO | Attending: Hematology and Oncology | Admitting: Physical Therapy

## 2022-01-03 ENCOUNTER — Telehealth: Payer: Self-pay | Admitting: Hematology and Oncology

## 2022-01-03 DIAGNOSIS — M6281 Muscle weakness (generalized): Secondary | ICD-10-CM | POA: Diagnosis not present

## 2022-01-03 DIAGNOSIS — M62838 Other muscle spasm: Secondary | ICD-10-CM | POA: Insufficient documentation

## 2022-01-03 NOTE — Telephone Encounter (Signed)
Spoke with patient about scheduling future appointments per [employer]. Patient aware and requested a visit with Gudena to discuss going up on medication. Scheduled appointment with MD with approval. Patient is aware of all appointments and then requested more information about being on antibiotics. Told patient I would let somrone know, so they will follow up with her. Let RN Merleen Nicely know about situation.

## 2022-01-03 NOTE — Therapy (Signed)
OUTPATIENT PHYSICAL THERAPY TREATMENT NOTE   Patient Name: Teresa Sheppard MRN: [id number] DOB:1960-11-11, 61 y.o., female Today's Date: 01/03/2022  PCP: Wenda Low, MD REFERRING PROVIDER:  Nicholas Lose, MD    END OF SESSION:   PT End of Session - 01/03/22 1147     Visit Number 4    Date for PT Re-Evaluation 01/23/22    Authorization Type BCBS    PT Start Time 1147    PT Stop Time 1227    PT Time Calculation (min) 40 min    Activity Tolerance Patient tolerated treatment well    Behavior During Therapy [facility] for tasks assessed/performed               Past Medical History:  Diagnosis Date   Anxiety    Breast cancer in female [facility])    Right   Family history of breast cancer 11/07/2020   Family history of pancreatic cancer 11/07/2020   History of kidney stones 2014-2015   Hyperthyroidism    No longer an issue   PONV (postoperative nausea and vomiting)    Thyromegaly    Past Surgical History:  Procedure Laterality Date   ABDOMINAL WALL DEFECT REPAIR     following hernia repair in 2010   [place]     2008  while pregnant with last child. Scar tissue from appendectomy wrapped around bowel and was snipped to free bowel.    CESAREAN SECTION     DILATION AND CURETTAGE OF UTERUS     2000 and 2014   HERNIA REPAIR     MASTECTOMY W/ SENTINEL NODE BIOPSY Right 11/28/2020   Procedure: RIGHT MASTECTOMY WITH RIGHT AXILLARY SENTINEL LYMPH NODE BIOPSY;  Surgeon: [name], MD;  Location: [place];  Service: General;  Laterality: Right;   PORT-A-CATH REMOVAL Left 01/30/2021   Procedure: PORT REMOVAL;  Surgeon: [name], MD;  Location: [place];  Service: General;  Laterality: Left;  LOCAL   PORTACATH PLACEMENT Left 07/12/2020   Procedure: INSERTION PORT-A-CATH WITH ULTRASOUND GUIDANCE;  Surgeon: [name], MD;  Location: [place];  Service: General;  Laterality: Left;   RADIOACTIVE SEED GUIDED AXILLARY SENTINEL LYMPH NODE Right  11/28/2020   Procedure: RADIOACTIVE SEED GUIDED RIGHT AXILLARY AXILLARY SENTINEL LYMPH NODE EXCISION;  Surgeon: [name], MD;  Location: [place];  Service: General;  Laterality: Right;   RADIOACTIVE SEED GUIDED EXCISIONAL BREAST BIOPSY Left 11/28/2020   Procedure: RADIOACTIVE SEED GUIDED LEFT EXCISIONAL BREAST BIOPSY;  Surgeon: [name], MD;  Location: [place];  Service: General;  Laterality: Left;   ROOT CANAL     2019   TONSILLECTOMY  1969   URETERAL EXPLORATION     dilataion for congenitally small ureter   Patient Active Problem List   Diagnosis Date Noted   Chemotherapy-induced peripheral neuropathy ([place]) 10/24/2021   Vitamin D deficiency 08/30/2021   Osteoporosis 08/30/2021   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   Genetic testing 11/15/2020   Family history of breast cancer 11/07/2020   Family history of pancreatic cancer 11/07/2020   Dysphagia 10/26/2020   Malignant neoplasm of overlapping sites of right breast in female, estrogen receptor positive ([place]) 06/21/2020   Hyperthyroidism 03/28/2016    REFERRING DIAG: C50.811,Z17.0 (ICD-10-CM) - Malignant neoplasm of overlapping sites of right breast in female, estrogen receptor positive ([place])  THERAPY DIAG:  Muscle weakness (generalized)  Other muscle spasm  Rationale for Evaluation and Treatment Rehabilitation  PERTINENT HISTORY: Hernia repair 2010, appendectomy, bowel resection, c-section, breast  cancer, chronic UTI  PRECAUTIONS: None  SUBJECTIVE: I am doing the [place] lisa touch tomorrow so I can't have any lotions or creams in the vagina.  Now since the UTI cleared up there is no burning and stream is better.  I am peeing a lot because drinking a lot of water.  Lately I have been leaking a small amount when run/walking on the treadmill and when getting up at night to use the bathroom.  It is very small amounts and a few times per week.  PAIN:  Are you having pain? No   OBJECTIVE: (objective measures  completed at initial evaluation unless otherwise dated)  PATIENT SURVEYS:      PFIQ-7 POPIQ = 19   COGNITION:            Overall cognitive status: Within functional limits for tasks assessed                              MUSCLE LENGTH: Hamstrings: Right 80 deg; Left 70 deg     LUMBAR SPECIAL TESTS:  ASLR - harder on left but a little easier with compression   FUNCTIONAL TESTS:    SLS trendelenburg Rt side; unsteady bil GAIT:   Comments: WFL   POSTURE:  Rt hip elevated in standing   LUMBARAROM/PROM   A/PROM A/PROM  10/31/2021  Flexion 75%  Extension    Right lateral flexion    Left lateral flexion    Right rotation    Left rotation     (Blank rows = not tested)   LE ROM:   Passive ROM Right 10/31/2021 Left 10/31/2021  Hip flexion 80% 90%  Hip extension      Hip abduction      Hip adduction      Hip internal rotation 50% 75%  Hip external rotation 50% 75%  Knee flexion      Knee extension      Ankle dorsiflexion      Ankle plantarflexion      Ankle inversion      Ankle eversion       (Blank rows = not tested)   LE MMT:   MMT Right 10/31/2021 Left 10/31/2021  Hip flexion      Hip extension      Hip abduction      Hip adduction      Hip internal rotation      Hip external rotation      Knee flexion      Knee extension      Ankle dorsiflexion      Ankle plantarflexion      Ankle inversion      Ankle eversion        PELVIC MMT:   MMT   10/31/2021  Vaginal 2/5 (weak squeeze, definite lift)  Internal Anal Sphincter    External Anal Sphincter    Puborectalis    Diastasis Recti    (Blank rows = not tested)         PALPATION:   General  tension on the Rt flank and slight side bend in standing                 External Perineal Exam descending perineal body                             Internal Pelvic Floor decreased closure and TTP Lt side more  than Rt; difficulty holding contraction and fatigues quickly with reps; stiff muscles   TONE: high    PROLAPSE: None observed - did not do bearing down   TODAY'S TREATMENT  Date: 01/03/22  Patient confirms identification and approves physical therapist to perform biofeedback with external sensor placement.   Pt had to use bathroom and then sensors fell off so did tactile cues instead external to clothing Kegel in hook lying with and without ball squeeze Straight leg raises cues to decreased breath holding Review of HEP and frequency of stretches  Date: 12/05/21  Patient confirms identification and approves physical therapist to perform internal soft tissue work  Manual - fascial release to urethra and bladder internally and externally; externally around incision on right lower abdomen and central abdomen  Self care - f/u and educated on moisturizers; gave samples of good clean love  Date: 11/27/21 HEP established-see below  Manual - lumbar and thoracic paraspinals Trigger Point Dry-Needling  Treatment instructions: Expect mild to moderate muscle soreness. S/S of pneumothorax if dry needled over a lung field, and to seek immediate medical attention should they occur. Patient verbalized understanding of these instructions and education.  Patient Consent Given: Yes Education handout provided: Yes Muscles treated: T10-12 Electrical stimulation performed: No Parameters: N/A Treatment response/outcome: increased soft tissue lenght Exercise - child pose, threading, qped rotation Self care - f/u and educated on moisturizers; gave handout    EVAL-  info on vaginal moisturizer and sampel of v-magic     PATIENT EDUCATION:  Education details: sample given; Access Code: [email or web address] Person educated: Patient Education method: Explanation Education comprehension: verbalized understanding     HOME EXERCISE PROGRAM: Access Code: [email or web address] URL: [email or web address] Date: 01/03/2022 Prepared by: Jari Favre  Exercises - Child's Pose with Thread the Needle  - 1 x daily -  7 x weekly - 1 sets - 2 reps - Quadruped Full Range Thoracic Rotation with Reach  - 1 x daily - 7 x weekly - 1 sets - 5 reps - Ball squeeze with Kegel  - 3 x daily - 7 x weekly - 1 sets - 10 reps - 3 sec hold  Patient Education - Trigger Point Dry Needling   ASSESSMENT:   CLINICAL IMPRESSION: Pt was able to understand how to correctly perform a kegel in supine.  Pt did have some difficutly feeling the muscle contraction.  Unable to use biofeedback today due to timing and pt needed to use the bathroom, but she will likely benefit from this at next follow up visit.  Pt was given exercise to work on strength and coordination as seen in chart.     OBJECTIVE IMPAIRMENTS decreased coordination, decreased endurance, decreased ROM, decreased strength, postural dysfunction, and pain.    ACTIVITY LIMITATIONS community activity and interpersonal relationships .    PERSONAL FACTORS 1-2 comorbidities: history of breast cancer, radiation, chemo, anti-estrogen medicine  are also affecting patient's functional outcome.      REHAB POTENTIAL: Excellent   CLINICAL DECISION MAKING: Evolving/moderate complexity   EVALUATION COMPLEXITY: Moderate     GOALS: Goals reviewed with patient? Yes   SHORT TERM GOALS: Target date: 11/28/2021   Ind with initial HEP Baseline: 11/27/21  Goal status: met 12/05/21  2.  Ind with moisturizer for vaginal canal and has found one that feels good for reducing chaffing. Baseline: 01/03/22  Goal status: met       LONG TERM GOALS: Target date: 01/23/2022  updated 01/03/22  Pt will be independent with advanced HEP to  maintain improvements made throughout therapy   Baseline:  Goal status: Ongoing   2.  Pt will report 75% reduction of pain due to improvements in posture, strength, and muscle length   Baseline:  Goal status: Ongoing   3.  Pt will have POPIQ score improved by least 5 points. Baseline:  Goal status: Ongoing   4.  Pt will have nocturia decreased  to 1-2/night at most   Baseline: 2-4/night Goal status: Partially met   5.  Pt will not have pain with voiding due to improved skin integrity and muscle tone Baseline:  Goal status: met       PLAN: PT FREQUENCY: 1x/week   PT DURATION: 12 weeks   PLANNED INTERVENTIONS: Therapeutic exercises, Therapeutic activity, Neuromuscular re-education, Balance training, Gait training, Patient/Family education, Joint mobilization, Dry Needling, Electrical stimulation, Spinal mobilization, Cryotherapy, Moist heat, Taping, Biofeedback, and Manual therapy   PLAN FOR NEXT SESSION: continue strengthening and try biofeedback; f/u with how the kegel with ball squeeze went    [employer], PT 01/03/2022, 11:47 AM

## 2022-01-05 ENCOUNTER — Encounter: Payer: Self-pay | Admitting: Hematology and Oncology

## 2022-01-07 DIAGNOSIS — M545 Low back pain, unspecified: Secondary | ICD-10-CM | POA: Diagnosis not present

## 2022-01-07 DIAGNOSIS — R309 Painful micturition, unspecified: Secondary | ICD-10-CM | POA: Diagnosis not present

## 2022-01-08 ENCOUNTER — Inpatient Hospital Stay (HOSPITAL_BASED_OUTPATIENT_CLINIC_OR_DEPARTMENT_OTHER): Payer: BC Managed Care – PPO | Admitting: Pharmacist

## 2022-01-08 DIAGNOSIS — Z170 Estrogen receptor positive status [ER+]: Secondary | ICD-10-CM

## 2022-01-08 DIAGNOSIS — C50811 Malignant neoplasm of overlapping sites of right female breast: Secondary | ICD-10-CM

## 2022-01-08 NOTE — Progress Notes (Unsigned)
[facility]       Telephone: [phone]?Fax: [phone]   Oncology Clinical Pharmacist Practitioner Progress Note  Teresa Sheppard was contacted via video visit to discuss her chemotherapy regimen for neratinib which they receive under the care of Dr. Nicholas Lose.   I connected with Teresa Sheppard on 01/08/2022 at  2:00 PM EDT by MyChart video conference and verified that I am speaking with the correct person using two identifiers. I discussed the limitations, risks, security and privacy concerns of performing an evaluation and management service by Webex and the availability of in person appointments. I also discussed with the patient that there may be a patient responsible charge related to this service. The patient expressed understanding and agreed to proceed.   Patient's Location: Home Provider's Location: Clinic   Current treatment regimen and start date Neratinib (12/28/21) Letrozole (12/29/21)  Interval History She continues on neratinib 2 tablets (80 mg) by mouth daily on days 1 to 28 of a 28-day cycle. This is being given  in combination with letrozole . Therapy is planned to continue until  1 years in the extended adjuvant setting .   Response to Therapy Teresa Sheppard is doing well.  She wanted to have a visit with clinical pharmacy today to discuss starting magnesium powder.  We told her that in most cases that would located but would prefer to check her magnesium levels during her visits before adding another supplement which may cause diarrhea.  She verbalized understanding of the plan.  We will add a standing order for magnesium to be drawn with her scheduled labs during her visits with clinical pharmacy and [name].  She plans to go up on the neratinib dose to 3 tabs starting this evening and will continue to increase by 1 tablet every 7 days as tolerated.  She does report having some burning sensations on Sunday that she attributed to a potential UTI and  she went to urgent care to be evaluated.  She states that the symptoms have resolved but she does have a urinary culture that will be back on Wednesday per her report.  The urgent care physician is managing this and will prescribe an appropriate antibiotic if applicable.  We did recommend to Teresa Sheppard that she keep urology informed since they have been managing her past UTI symptoms.  She reports having solid stools, today she had 4 bowel movements in the morning.  We again reviewed how to take over-the-counter loperamide as needed and she knows that she can always contact the clinic with any questions or concerns before she sees [name] again with labs on 01/29/22.  She reports that she does have a follow-up urology appointment on 01/24/22 with Dr Job Founds from [facility]. Labs/Vitals were not reviewed today since this was a video visit. She will continue neratinib at this time.  Allergies Allergies  Allergen Reactions   Shellfish-Derived Products Anaphylaxis    Per patient whenever she consumes a lot gives tightness of throat   Ciprofibrate Other (See Comments)    Joint pain    Oxycodone Hcl    Shrimp Extract Allergy Skin Test    Hydrocodone Anxiety and Nausea Only   Penicillins Rash   Adverse Effects Assessment Reporting no side effects that would be attributed to neratinib at this time.  Adherence Assessment Teresa Sheppard reports missing 0 doses over the past 1 weeks.   Reason for missed dose: N/A Patient was re-educated on importance of  adherence.   Access Assessment Teresa Sheppard is currently receiving her neratinib through [facility] concerns: None  Medication Reconciliation The patient's medication list was reviewed today with the patient?  Yes New medications or herbal supplements have recently been started?  No Any medications have been discontinued?  No, she continues to not take trimethoprim per her request.  She was on this from  12/21/21-01/01/22. The medication list was updated and reconciled based on the patient's most recent medication list in the electronic medical record (EMR) including herbal products and OTC medications.   Medications Current Outpatient Medications  Medication Sig Dispense Refill   acetaminophen (TYLENOL) 325 MG tablet Take 325 mg by mouth every 6 (six) hours as needed (headaches / pain). (Patient not taking: Reported on 01/01/2022)     Calcium-Vitamin D-Vitamin K ([employer] CALCIUM SOFT CHEWS) 650-12.5-40 MG-MCG-MCG CHEW Chew 1 tablet by mouth 2 (two) times daily.     cholecalciferol (VITAMIN D3) 25 MCG (1000 UNIT) tablet Take 2,000 Units by mouth daily.     diazepam (VALIUM) 2 MG tablet Take 1 tablet (2 mg total) by mouth every 8 (eight) hours as needed for up to 10 doses for muscle spasms (Esophageal spasm). (Patient not taking: Reported on 01/01/2022) 10 tablet 0   hydrocortisone cream 1 % Apply 1 application topically 4 (four) times daily as needed for itching. (Patient not taking: Reported on 01/01/2022)     Lactobacillus-Inulin (CULTURELLE ADULT ULT BALANCE PO) Take by mouth daily.     letrozole (FEMARA) 2.5 MG tablet Take 1 tablet (2.5 mg total) by mouth daily. 90 tablet 3   Multiple Vitamins-Minerals (HAIR/SKIN/NAILS/BIOTIN PO) Take 1 capsule by mouth daily.     Neratinib Maleate (NERLYNX) 40 MG tablet Take 4 tablets (160 mg total) by mouth daily. Take with food. 120 tablet 1   Polyethyl Glycol-Propyl Glycol (SYSTANE OP) Place 1 drop into both eyes daily as needed (dry eyes). (Patient not taking: Reported on 01/01/2022)     trimethoprim (TRIMPEX) 100 MG tablet Take 100 mg by mouth daily. (Patient not taking: Reported on 01/01/2022)     No current facility-administered medications for this visit.    Drug-Drug Interactions (DDIs) DDIs were evaluated?  Yes Significant DDIs?  No, she continues to space out calcium with neratinib. The patient was instructed to speak with their health care provider  and/or the oral chemotherapy pharmacist before starting any new drug, including prescription or over the counter, natural / herbal products, or vitamins.  Supportive Care We again reviewed how to properly take loperamide and that she should watch her GI symptoms closely especially if she starts an antibiotic With regard to vitamins, minerals, and herbal medications, we discussed that it is important to realize that these are not tested or regulated by the FDA. Therefore, they have not been studied as extensively as prescription medications, and their safety profile is not as well known. In addition, their purity cannot be guaranteed. Many studies have shown that the ingredients listed on a bottle of an herbal supplement may not actually be contained inside. The [employer] website: TheyParty.dk may provide some useful information.    Dosing Assessment Hepatic adjustments needed?  No labs today Renal adjustments needed?  No labs today Toxicity adjustments needed?  No The current dosing regimen is appropriate to continue at this time.  Follow-Up Plan Teresa Sheppard will increase neratinib to 3 tablets (120 mg) by mouth starting tonight.  She will closely monitor for side effects and continue  to increase by 1 tablet every 7 days until she sees [name] next on 01/29/22 Per her request, she will continue to hold the trimethoprim which was prescribed by her neurologist.  She does have urine cultures that are pending per her urgent care visit yesterday.  Based on these results, she may start an antibiotic if her urgent care physician deems it is appropriate.  We did recommend that Teresa Sheppard notify her urologist of any changes with her UTI treatment plan.  They have been managing it. Continue letrozole 2.5 mg by mouth daily Continue to monitor for loose stools and side effects that could be attributed to neratinib.   Use loperamide as needed and contact the clinic should she have any nausea or vomiting. Magnesium level has been added as a standing order per patient request. Labs, [name] visit, on 01/29/22 Labs, pharmacy clinic visit, on 02/27/22 Manufacturing guidelines for neratinib recommend labs monthly for 3 months and then every 3 months thereafter or as clinically indicated  Teresa Sheppard participated in the discussion, expressed understanding, and voiced agreement with the above plan. All questions were answered to her satisfaction. The patient was advised to contact the clinic at [phone] with any questions or concerns prior to her return visit.   I spent 30 minutes assessing and educating the patient.  Teresa Sheppard, RPH-CPP, 01/08/2022  2:45 PM   **Disclaimer: This note was dictated with voice recognition software. Similar sounding words can inadvertently be transcribed and this note may contain transcription errors which may not have been corrected upon publication of note.**

## 2022-01-11 ENCOUNTER — Other Ambulatory Visit (HOSPITAL_COMMUNITY): Payer: Self-pay

## 2022-01-11 DIAGNOSIS — N390 Urinary tract infection, site not specified: Secondary | ICD-10-CM | POA: Diagnosis not present

## 2022-01-12 ENCOUNTER — Other Ambulatory Visit (HOSPITAL_COMMUNITY): Payer: Self-pay

## 2022-01-15 ENCOUNTER — Encounter: Payer: Self-pay | Admitting: Pharmacist

## 2022-01-17 ENCOUNTER — Ambulatory Visit: Payer: BC Managed Care – PPO | Admitting: Physical Therapy

## 2022-01-17 ENCOUNTER — Encounter: Payer: Self-pay | Admitting: Physical Therapy

## 2022-01-17 ENCOUNTER — Inpatient Hospital Stay: Payer: BC Managed Care – PPO | Admitting: Pharmacist

## 2022-01-17 DIAGNOSIS — M6281 Muscle weakness (generalized): Secondary | ICD-10-CM

## 2022-01-17 DIAGNOSIS — L03011 Cellulitis of right finger: Secondary | ICD-10-CM | POA: Diagnosis not present

## 2022-01-17 DIAGNOSIS — C50919 Malignant neoplasm of unspecified site of unspecified female breast: Secondary | ICD-10-CM | POA: Diagnosis not present

## 2022-01-17 DIAGNOSIS — M62838 Other muscle spasm: Secondary | ICD-10-CM | POA: Diagnosis not present

## 2022-01-17 DIAGNOSIS — Z170 Estrogen receptor positive status [ER+]: Secondary | ICD-10-CM

## 2022-01-17 NOTE — Therapy (Signed)
OUTPATIENT PHYSICAL THERAPY TREATMENT NOTE   Patient Name: Teresa Sheppard MRN: [id number] DOB:Sep 09, 1960, 61 y.o., female Today's Date: 01/17/2022  PCP: Wenda Low, MD REFERRING PROVIDER:  Nicholas Lose, MD    END OF SESSION:   PT End of Session - 01/17/22 1458     Visit Number 5    Date for PT Re-Evaluation 01/23/22    Authorization Type BC[employer]    PT Start Time 1452    PT Stop Time 9379    PT Time Calculation (min) 38 min    Activity Tolerance Patient tolerated treatment well    Behavior During Therapy WFL for tasks assessed/performed                Past Medical History:  Diagnosis Date   Anxiety    Breast cancer in female [facility])    Right   Family history of breast cancer 11/07/2020   Family history of pancreatic cancer 11/07/2020   History of kidney stones 2014-2015   Hyperthyroidism    No longer an issue   PONV (postoperative nausea and vomiting)    Thyromegaly    Past Surgical History:  Procedure Laterality Date   ABDOMINAL WALL DEFECT REPAIR     following hernia repair in 2010   [place]     2008  while pregnant with last child. Scar tissue from appendectomy wrapped around bowel and was snipped to free bowel.    CESAREAN SECTION     DILATION AND CURETTAGE OF UTERUS     2000 and 2014   HERNIA REPAIR     MASTECTOMY W/ SENTINEL NODE BIOPSY Right 11/28/2020   Procedure: RIGHT MASTECTOMY WITH RIGHT AXILLARY SENTINEL LYMPH NODE BIOPSY;  Surgeon: [name], MD;  Location: [place];  Service: General;  Laterality: Right;   PORT-A-CATH REMOVAL Left 01/30/2021   Procedure: PORT REMOVAL;  Surgeon: [name], MD;  Location: [place];  Service: General;  Laterality: Left;  LOCAL   PORTACATH PLACEMENT Left 07/12/2020   Procedure: INSERTION PORT-A-CATH WITH ULTRASOUND GUIDANCE;  Surgeon: [name], MD;  Location: [place];  Service: General;  Laterality: Left;   RADIOACTIVE SEED GUIDED AXILLARY SENTINEL LYMPH NODE Right  11/28/2020   Procedure: RADIOACTIVE SEED GUIDED RIGHT AXILLARY AXILLARY SENTINEL LYMPH NODE EXCISION;  Surgeon: [name], MD;  Location: [place];  Service: General;  Laterality: Right;   RADIOACTIVE SEED GUIDED EXCISIONAL BREAST BIOPSY Left 11/28/2020   Procedure: RADIOACTIVE SEED GUIDED LEFT EXCISIONAL BREAST BIOPSY;  Surgeon: [name], MD;  Location: [place];  Service: General;  Laterality: Left;   ROOT CANAL     2019   TONSILLECTOMY  1969   URETERAL EXPLORATION     dilataion for congenitally small ureter   Patient Active Problem List   Diagnosis Date Noted   Chemotherapy-induced peripheral neuropathy ([place]) 10/24/2021   Vitamin D deficiency 08/30/2021   Osteoporosis 08/30/2021   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   Genetic testing 11/15/2020   Family history of breast cancer 11/07/2020   Family history of pancreatic cancer 11/07/2020   Dysphagia 10/26/2020   Malignant neoplasm of overlapping sites of right breast in female, estrogen receptor positive ([place]) 06/21/2020   Hyperthyroidism 03/28/2016    REFERRING DIAG: C50.811,Z17.0 (ICD-10-CM) - Malignant neoplasm of overlapping sites of right breast in female, estrogen receptor positive ([place])  THERAPY DIAG:  Muscle weakness (generalized)  Other muscle spasm  Rationale for Evaluation and Treatment Rehabilitation  PERTINENT HISTORY: Hernia repair 2010, appendectomy, bowel resection, c-section,  breast cancer, chronic UTI  PRECAUTIONS: None  SUBJECTIVE: I am having trouble relaxing the pelvic floor and noticed a small amount of leakage.  Now the leakage went away and I can feel the muscle engage when I do the kegel.  PAIN:  Are you having pain? No   OBJECTIVE: (objective measures completed at initial evaluation unless otherwise dated)  PATIENT SURVEYS:      PFIQ-7 POPIQ = 19   COGNITION:            Overall cognitive status: Within functional limits for tasks assessed                               MUSCLE LENGTH: Hamstrings: Right 80 deg; Left 70 deg     LUMBAR SPECIAL TESTS:  ASLR - harder on left but a little easier with compression   FUNCTIONAL TESTS:    SLS trendelenburg Rt side; unsteady bil GAIT:   Comments: WFL   POSTURE:  Rt hip elevated in standing   LUMBARAROM/PROM   A/PROM A/PROM  10/31/2021  Flexion 75%  Extension    Right lateral flexion    Left lateral flexion    Right rotation    Left rotation     (Blank rows = not tested)   LE ROM:   Passive ROM Right 10/31/2021 Left 10/31/2021  Hip flexion 80% 90%  Hip extension      Hip abduction      Hip adduction      Hip internal rotation 50% 75%  Hip external rotation 50% 75%  Knee flexion      Knee extension      Ankle dorsiflexion      Ankle plantarflexion      Ankle inversion      Ankle eversion       (Blank rows = not tested)   LE MMT:   MMT Right 10/31/2021 Left 10/31/2021  Hip flexion      Hip extension      Hip abduction      Hip adduction      Hip internal rotation      Hip external rotation      Knee flexion      Knee extension      Ankle dorsiflexion      Ankle plantarflexion      Ankle inversion      Ankle eversion        PELVIC MMT:   MMT   10/31/2021  Vaginal 2/5 (weak squeeze, definite lift)  Internal Anal Sphincter    External Anal Sphincter    Puborectalis    Diastasis Recti    (Blank rows = not tested)         PALPATION:   General  tension on the Rt flank and slight side bend in standing                 External Perineal Exam descending perineal body                             Internal Pelvic Floor decreased closure and TTP Lt side more than Rt; difficulty holding contraction and fatigues quickly with reps; stiff muscles   TONE: high   PROLAPSE: None observed - did not do bearing down   TODAY'S TREATMENT  Date: 01/17/22  Qped UE reach, kegel and transversus abdominus activation - 20x Shoulder ext, row -  red 20x staggered stance Dead lift 10lb  20x   Date: 01-04-22  Patient confirms identification and approves physical therapist to perform biofeedback with external sensor placement.   Pt had to use bathroom and then sensors fell off so did tactile cues instead external to clothing Kegel in hook lying with and without ball squeeze Straight leg raises cues to decreased breath holding Review of HEP and frequency of stretches  Date: 12/05/21  Patient confirms identification and approves physical therapist to perform internal soft tissue work  Manual - fascial release to urethra and bladder internally and externally; externally around incision on right lower abdomen and central abdomen  Self care - f/u and educated on moisturizers; gave samples of good clean love  Date: 11/27/21 HEP established-see below  Manual - lumbar and thoracic paraspinals Trigger Point Dry-Needling  Treatment instructions: Expect mild to moderate muscle soreness. S/S of pneumothorax if dry needled over a lung field, and to seek immediate medical attention should they occur. Patient verbalized understanding of these instructions and education.  Patient Consent Given: Yes Education handout provided: Yes Muscles treated: T10-12 Electrical stimulation performed: No Parameters: N/A Treatment response/outcome: increased soft tissue lenght Exercise - child pose, threading, qped rotation Self care - f/u and educated on moisturizers; gave handout    EVAL-  info on vaginal moisturizer and sampel of v-magic     PATIENT EDUCATION:  Education details: sample given; Access Code: [email or web address] Person educated: Patient Education method: Explanation Education comprehension: verbalized understanding     HOME EXERCISE PROGRAM: Access Code: [email or web address] URL: [email or web address] Date: 01/17/2022 Prepared by: Jari Favre  Exercises - Child's Pose with Thread the Needle  - 1 x daily - 7 x weekly - 1 sets - 2 reps - Ball squeeze with Kegel  - 3 x  daily - 7 x weekly - 1 sets - 10 reps - 3 sec hold - Shoulder extension with resistance - Neutral  - 1 x daily - 7 x weekly - 3 sets - 10 reps - Quadruped Exhale with Pelvic Floor Contraction  - 3 x daily - 7 x weekly - 1 sets - 10 reps - 3-5 hold - Half Deadlift with Kettlebell  - 1 x daily - 7 x weekly - 3 sets - 10 reps  Patient Education - Trigger Point Dry Needling   ASSESSMENT:   CLINICAL IMPRESSION: Pt is doing well and was able to progress strengthening of core and pelvic floor in different positions.  Pt has spaced out her appointments so she has been making progress slowly towards the more advanced exercises.  She has also been working on other therapies such as the [employer] treatment.  Now that she is through with that, she will benefit from skilled PT to continue working on functional strength.     OBJECTIVE IMPAIRMENTS decreased coordination, decreased endurance, decreased ROM, decreased strength, postural dysfunction, and pain.    ACTIVITY LIMITATIONS community activity and interpersonal relationships .    PERSONAL FACTORS 1-2 comorbidities: history of breast cancer, radiation, chemo, anti-estrogen medicine  are also affecting patient's functional outcome.      REHAB POTENTIAL: Excellent   CLINICAL DECISION MAKING: Evolving/moderate complexity   EVALUATION COMPLEXITY: Moderate     GOALS: Goals reviewed with patient? Yes   SHORT TERM GOALS: Target date: 11/28/2021   Ind with initial HEP Baseline: 11/27/21  Goal status: met 12/05/21  2.  Ind with moisturizer for vaginal canal and has found one that feels good for reducing chaffing. Baseline:  01/03/22  Goal status: met       LONG TERM GOALS: Target date: 04/11/22  updated 01/17/22  Pt will be independent with advanced HEP to maintain improvements made throughout therapy   Baseline:  Goal status: Ongoing   2.  Pt will report 75% reduction of pain due to improvements in posture, strength, and muscle  length   Baseline: not having pain Goal status: Met   3.  Pt will have POPIQ score improved by least 5 points. Baseline:  Goal status: Ongoing   4.  Pt will have nocturia decreased to 1-2/night at most   Baseline: 2-4/night Goal status: Partially met   5.  Pt will not have pain with voiding due to improved skin integrity and muscle tone Baseline:  Goal status: met 6.  Pt will be able to do all functional exercises without leakage Baseline:  Goal status: INITIAL        PLAN: PT FREQUENCY: 1x/week   PT DURATION: 12 weeks   PLANNED INTERVENTIONS: Therapeutic exercises, Therapeutic activity, Neuromuscular re-education, Balance training, Gait training, Patient/Family education, Joint mobilization, Dry Needling, Electrical stimulation, Spinal mobilization, Cryotherapy, Moist heat, Taping, Biofeedback, and Manual therapy   PLAN FOR NEXT SESSION: continue strengthening of core and pelvic floor    Camillo Flaming Stan Cantave, PT 01/17/2022, 3:36 PM

## 2022-01-17 NOTE — Progress Notes (Signed)
[place]       Telephone: [phone]?Fax: [phone]   Oncology Clinical Pharmacist Practitioner Progress Note  Teresa Sheppard was contacted via video visit to discuss her chemotherapy regimen for neratinib which they receive under the care of Dr. Nicholas Lose.   I connected with Teresa Sheppard on 01/17/22 at 10:00 AM EDT by video and verified that I am speaking with the correct person using two identifiers.   I discussed the limitations, risks, security and privacy concerns of performing an evaluation and management service by telemedicine and the availability of in-person appointments. I also discussed with the patient that there may be a patient responsible charge related to this service. The patient expressed understanding and agreed to proceed.   Other persons participating in the visit and their role in the encounter: none   Patient's location: home  Provider's location: clinic  Current treatment regimen and start date Neratinib (12/28/21) Letrozole (12/29/20)  Interval History She continues on neratinib 3 tablets (120 mg) by mouth daily on days 1 to 28 of a 28-day cycle. This is being given in combination with letrozole. Therapy is planned to continue until 1 year of therapy in the extended adjuvant setting.  The tentative date is 12/28/2022.   Response to Therapy Teresa Sheppard Is seen today by clinical pharmacy via video visit as a follow-up to her neratinib management.  She continues to tolerate neratinib well at the 3 tablet daily dose.  She did have a bout of loose stool that she took a half dose of liquid loperamide for last Saturday with relief.  She believes it may be the [place] that she had on Friday evening because it was very rich.  Her loose stools have not continued.  We discussed about increasing her dose of neratinib to 4 tablets today.  She plans on doing this on Sunday (01/21/22).  We have discussed in the past that if she does increase the dose of  neratinib, she should only increase every 7 days and not sooner.  She is agreeable to this plan.  If she continues to tolerate the neratinib, she will then increase her dose to 5 tablets tentatively on 01/28/22 and then see [name] with labs the following day.  She states that the urinary symptoms that she was having have resolved and that the cultures that were done by urgent care came back negative.  She will continue to be followed by her urologist for the management of this problem and she says that she next will see them sometime around 01/24/22.  Teresa Sheppard is very active and likes walking in the evening.  She discussed that as it starts to get colder in the sun goes down sooner, she may want to take her neratinib earlier in the evening around 5 PM.  She currently takes it at 7:30 PM.  We discussed that if she does decide to do this, the proper steps to take with adjusting her administration time.  No labs were done today due to the video visit note of communication.  As discussed above, she will next have labs and see [name] tentatively on 01/29/22. Patient is in agreement to continue neratinib therapy at this time.  Allergies Allergies  Allergen Reactions   Shellfish-Derived Products Anaphylaxis    Per patient whenever she consumes a lot gives tightness of throat   Ciprofibrate Other (See Comments)    Joint pain    Oxycodone Hcl    Shrimp Extract  Allergy Skin Test    Hydrocodone Anxiety and Nausea Only   Penicillins Rash   Adverse Effects Assessment Diarrhea: one episode. Per her report, as above, likely due to something she ate on 01/12/22. She took liquid loperamide with relief.  Adherence Assessment Teresa Sheppard reports missing 0 doses over the past 1 weeks.   Reason for missed dose: n/a Patient was re-educated on importance of adherence.   Access Assessment Teresa Sheppard is currently receiving her neratinib through [facility] concerns:   no  Medication Reconciliation The patient's medication list was reviewed today with the patient? No New medications or herbal supplements have recently been started? No  Any medications have been discontinued? No , she has not started the trimethoprim but has it on hand The medication list was updated and reconciled based on the patient's most recent medication list in the electronic medical record (EMR) including herbal products and OTC medications.   Medications Current Outpatient Medications  Medication Sig Dispense Refill   acetaminophen (TYLENOL) 325 MG tablet Take 325 mg by mouth every 6 (six) hours as needed (headaches / pain). (Patient not taking: Reported on 01/01/2022)     Calcium-Vitamin D-Vitamin K ([employer] CALCIUM SOFT CHEWS) 650-12.5-40 MG-MCG-MCG CHEW Chew 1 tablet by mouth 2 (two) times daily.     cholecalciferol (VITAMIN D3) 25 MCG (1000 UNIT) tablet Take 2,000 Units by mouth daily.     diazepam (VALIUM) 2 MG tablet Take 1 tablet (2 mg total) by mouth every 8 (eight) hours as needed for up to 10 doses for muscle spasms (Esophageal spasm). (Patient not taking: Reported on 01/01/2022) 10 tablet 0   hydrocortisone cream 1 % Apply 1 application topically 4 (four) times daily as needed for itching. (Patient not taking: Reported on 01/01/2022)     Lactobacillus-Inulin (CULTURELLE ADULT ULT BALANCE PO) Take by mouth daily.     letrozole (FEMARA) 2.5 MG tablet Take 1 tablet (2.5 mg total) by mouth daily. 90 tablet 3   Multiple Vitamins-Minerals (HAIR/SKIN/NAILS/BIOTIN PO) Take 1 capsule by mouth daily.     Neratinib Maleate (NERLYNX) 40 MG tablet Take 4 tablets (160 mg total) by mouth daily. Take with food. 120 tablet 1   Polyethyl Glycol-Propyl Glycol (SYSTANE OP) Place 1 drop into both eyes daily as needed (dry eyes). (Patient not taking: Reported on 01/01/2022)     trimethoprim (TRIMPEX) 100 MG tablet Take 100 mg by mouth daily. (Patient not taking: Reported on 01/01/2022)     No current  facility-administered medications for this visit.    Drug-Drug Interactions (DDIs) DDIs were evaluated? Yes Significant DDIs?  She knows to separate her calcium supplement from neratinib The patient was instructed to speak with their health care provider and/or the oral chemotherapy pharmacist before starting any new drug, including prescription or over the counter, natural / herbal products, or vitamins.  Supportive Care Discussed how to change administration times of neratinib Reviewed what to do if she misses a dose of neratinib Reviewed neratinib dose titration frequency  Dosing Assessment Hepatic adjustments needed?  N/A Renal adjustments needed?  N/A Toxicity adjustments needed? No  The current dosing regimen is appropriate to continue at this time.  Follow-Up Plan Continue neratinib 3 tablets by mouth daily with food.  She will likely increase to 4 tablets on 01/21/22 and if tolerated to 5 tablets on 01/28/22 Continue to monitor side effects and take antidiarrheals and antinausea meds as needed Labs, [name] visit, on 01/29/22 Labs, pharmacy  clinic visit, on 02/27/22 Continue to follow with urology for urinary symptom management  Eupha Lobb Struthers participated in the discussion, expressed understanding, and voiced agreement with the above plan. All questions were answered to her satisfaction. The patient was advised to contact the clinic at [phone] with any questions or concerns prior to her return visit.   I spent 30 minutes assessing and educating the patient.  Raina Mina, RPH-CPP, 01/17/2022  10:29 AM   **Disclaimer: This note was dictated with voice recognition software. Similar sounding words can inadvertently be transcribed and this note may contain transcription errors which may not have been corrected upon publication of note.**

## 2022-01-19 ENCOUNTER — Other Ambulatory Visit (HOSPITAL_COMMUNITY): Payer: Self-pay

## 2022-01-21 NOTE — Progress Notes (Signed)
HEMATOLOGY-ONCOLOGY TELEPHONE VISIT PROGRESS NOTE  I connected with our patient on 01/29/22 at  2:30 PM EDT by telephone and verified that I am speaking with the correct person using two identifiers.  I discussed the limitations, risks, security and privacy concerns of performing an evaluation and management service by telephone and the availability of in person appointments.  I also discussed with the patient that there may be a patient responsible charge related to this service. The patient expressed understanding and agreed to proceed.    CHIEF COMPLIANT: Follow-up right breast cancer on Neratinib and letrozole  INTERVAL HISTORY: Teresa Sheppard is a 18 y.yo female with the above mention right breast cancer on Neratinib and letrozole. She presents to the clinic today for a follow-up.  She was diagnosed with COVID-19 infection and went on antiviral medication.  She is doing significantly better.  The fevers have subsided and the body aches and pains have also resolved.  She has been feeling better for the last 48 hours.  She is anxious to get started back on neratinib.  Oncology History  Malignant neoplasm of overlapping sites of right breast in female, estrogen receptor positive ([place])  06/21/2020 Initial Diagnosis   Patient palpated a right breast mass x 1 wk. Mammogram showed multiple confluent masses in the right breast at the 7 o'clock position measuring 2.3cm and at the 9 o'clock position measuring 2.2cm with one right axillary lymph node with cortical thickening. Biopsy showed invasive mammary carcinoma at both positions and in the axilla, grade 2-3, HER-2 equivocal by IHC (2+), positive by FISH (ratio 3.34), ER+ >95%, PR+ 25%, Ki67 60%.   06/29/2020 Cancer Staging   Staging form: Breast, AJCC 8th Edition - Clinical stage from 06/29/2020: Stage IB (cT2, cN1(f), cM0, G3, ER+, PR+, HER2+) - Signed by Nicholas Lose, MD on 06/29/2020   07/13/2020 - 11/16/2020 Neo-Adjuvant Chemotherapy   Taxotere,  Carbo, Herceptin, Perjeta x 6 followed by 1 cycle of herceptin/perjeta   10/28/2020 Breast MRI   Complete imaging response   11/14/2020 Genetic Testing   Negative hereditary cancer genetic testing: no pathogenic variants detected in [facility] BRCAPlus Panel and [facility] CancerNext-Expanded +RNAinsight Panel.  The report dates are Nov 14, 2020 and December 05, 2020, respectively.    The BRCAplus panel offered by [employer] and includes sequencing and deletion/duplication analysis for the following 8 genes: ATM, BRCA1, BRCA2, CDH1, CHEK2, PALB2, PTEN, and TP53.  The CancerNext-Expanded gene panel offered by [facility] and includes sequencing, rearrangement, and RNA analysis for the following 77 genes: AIP, ALK, APC, ATM, AXIN2, BAP1, BARD1, BLM, BMPR1A, BRCA1, BRCA2, BRIP1, CDC73, CDH1, CDK4, CDKN1B, CDKN2A, CHEK2, CTNNA1, DICER1, FANCC, FH, FLCN, GALNT12, KIF1B, LZTR1, MAX, MEN1, MET, MLH1, MSH2, MSH3, MSH6, MUTYH, NBN, NF1, NF2, NTHL1, PALB2, PHOX2B, PMS2, POT1, PRKAR1A, PTCH1, PTEN, RAD51C, RAD51D, RB1, RECQL, RET, SDHA, SDHAF2, SDHB, SDHC, SDHD, SMAD4, SMARCA4, SMARCB1, SMARCE1, STK11, SUFU, TMEM127, TP53, TSC1, TSC2, VHL and XRCC2 (sequencing and deletion/duplication); EGFR, EGLN1, HOXB13, KIT, MITF, PDGFRA, POLD1, and POLE (sequencing only); EPCAM and GREM1 (deletion/duplication only).    11/28/2020 Surgery   Right mastectomy [name]): IDC, grade 3, spanning 4.0cm fibrotic area, clear margins, 1/4 right axillary lymph nodes positive for carcinoma, 0.5cm.   Left lumpectomy: no evidence of malignancy   11/28/2020 Cancer Staging   Staging form: Breast, AJCC 8th Edition - Pathologic stage from 11/28/2020: No Stage Recommended (ypT2, pN1a, cM0) - Signed by Gardenia Phlegm, NP on 07/26/2021 Stage prefix: Post-therapy   12/07/2020 - 05/23/2021 Chemotherapy  Patient is on Treatment Plan : BREAST ADO-Trastuzumab Emtansine (Kadcyla) q21d     01/04/2021 - 02/17/2021 Radiation Therapy   Site Technique  Total Dose (Gy) Dose per Fx (Gy) Completed Fx Beam Energies  Chest Wall, Right: CW_Rt 3D 50.4/50.4 1.8 28/28 6X  Chest Wall, Right: CW_Rt_SCLV 3D 50.4/50.4 1.8 28/28 6X, 10X  Chest Wall, Right: CW_Rt_Bst Electron 10/10 2 5/5 6E     12/2020 -  Anti-estrogen oral therapy   Letrozole daily     REVIEW OF SYSTEMS:   Constitutional: Denies fevers, chills or abnormal weight loss All other systems were reviewed with the patient and are negative. Observations/Objective:     Assessment Plan:  Malignant neoplasm of overlapping sites of right breast in female, estrogen receptor positive ([place]) 06/21/2020:Patient palpated a right breast mass x 1 wk. Mammogram showed multiple confluent masses in the right breast at the 7 o'clock position measuring 2.3cm and at the 9 o'clock position measuring 2.2cm with one right axillary lymph node with cortical thickening. Biopsy showed invasive mammary carcinoma at both positions and in the axilla, grade 2-3, HER-2 equivocal by IHC (2+), positive by FISH (ratio 3.34), ER+ >95%, PR+ 25%, Ki67 60%.   Treatment plan: 1. Neoadjuvant chemotherapy with TCH Perjeta 6 cycles completed 10/26/2020 followed by Herceptin Perjeta versus [name] for 1 year completed 05/23/2021 2.  Right mastectomy [name]): IDC, grade 3, spanning 4.0cm fibrotic area, clear margins, 1/4 right axillary lymph nodes positive for carcinoma, 0.5cm. Left lumpectomy: no evidence of malignancy 3. Followed by adjuvant radiation therapy completed 02/17/2021 4.  Followed by adjuvant antiestrogen therapy started 12/29/2020 5.  Followed by neratinib (to be started July 2023) ---------------------------------------------------------------------------------------------------------------------------------- Current Treatment:letrozole  COVID infection: August 2023: Treated with antiviral medication. (Molnupiravir) Letrozole toxicities: Tolerating it extremely well.    Surveillance:  1. Mammograms  June 08, 2021 at [facility]: Benign Bone density: November 2022: T score -2.4:  2. Breast MRI: 11/17/2021: Benign breast density category C   Current Treatment: Neratinib held for COVID-19 infection: Encouraged her to restart neratinib on 01/30/2022.  She was started 4 tablets a day and in 2 weeks we will increase it to 5 tablets a day. She will see Jenny Reichmann in 1 month to discuss further titration up to 6 tablets a day.   I discussed the assessment and treatment plan with the patient. The patient was provided an opportunity to ask questions and all were answered. The patient agreed with the plan and demonstrated an understanding of the instructions. The patient was advised to call back or seek an in-person evaluation if the symptoms worsen or if the condition fails to improve as anticipated.   I provided 12 minutes of non-face-to-face time during this encounter.  This includes time for charting and coordination of care   Harriette Ohara, MD

## 2022-01-24 ENCOUNTER — Other Ambulatory Visit: Payer: Self-pay

## 2022-01-24 ENCOUNTER — Telehealth: Payer: Self-pay

## 2022-01-24 ENCOUNTER — Encounter: Payer: Self-pay | Admitting: Hematology and Oncology

## 2022-01-24 ENCOUNTER — Encounter: Payer: BC Managed Care – PPO | Admitting: Physical Therapy

## 2022-01-24 ENCOUNTER — Other Ambulatory Visit (HOSPITAL_COMMUNITY): Payer: Self-pay

## 2022-01-24 MED ORDER — MOLNUPIRAVIR EUA 200MG CAPSULE
4.0000 | ORAL_CAPSULE | Freq: Two times a day (BID) | ORAL | 0 refills | Status: DC
  Filled 2022-01-24: qty 40, 5d supply, fill #0

## 2022-01-24 NOTE — Telephone Encounter (Signed)
Pt called and LVM stating she has tested positive for COVID after being exposed to her 60 yr old son who is positive. Pt states in VM she is not feeling well, sx worsening today since sx onset 8/1. She reports aches/pains, headache. Mrs [place] did not specify in her VM if she is febrile.   Per Gilford Rile, RPH, she should stop neratinib at this time, we should send in rx for molnupiravir and pt should not start neratinib back until she completes molnupiravir and is no longer symptomatic.   LVM for pt to return call. Molnupiravir is not entered at this time, as pt needs to be educated by nursing before prescribed.

## 2022-01-24 NOTE — Telephone Encounter (Signed)
S/W pt who reports sx onset is 01/23/22. She reports a fever of 101.2 F axillary as she drank something cold and she was concerned it would be inaccurate. Pt states she has chills and "unbearable body aches" which have worsened today. Per MD, Molnupiravir sent in to [place]. Pt was educated per previous note, Gilford Rile, and she was educated on side effects of molnupiravir. Encouraged pt to finish medication in entirety. She understands her visit with MD has been changed to a telephone visit 01/27/22. Pt knows to call for any further questions or concerns.

## 2022-01-25 ENCOUNTER — Encounter: Payer: Self-pay | Admitting: Hematology and Oncology

## 2022-01-25 ENCOUNTER — Other Ambulatory Visit (HOSPITAL_COMMUNITY): Payer: Self-pay

## 2022-01-25 ENCOUNTER — Telehealth: Payer: Self-pay | Admitting: Pharmacist

## 2022-01-25 ENCOUNTER — Telehealth: Payer: Self-pay | Admitting: *Deleted

## 2022-01-25 NOTE — Telephone Encounter (Signed)
[place]       Telephone: [phone]?Fax: [phone]   Oncology Clinical Pharmacist Practitioner Progress Note  Teresa Sheppard is a 61 y.o. female with a diagnosis of breast cancer currently on neratinib under the care of Dr. Nicholas Lose.   [name] was contacted by clinical pharmacy via telephone and confirmed we were speaking to the correct patient using 2 patient identifiers. She had questions regarding her recent prescription that [name] had prescribed for her symptomatic COVID-19.  Molnupiravir was sent to the [facility] outpatient pharmacy by [name] yesterday and she was to start as soon as receiving the medication.  She states she has not picked up the medication yet but will be contacting them today.  She will start this medication as soon as she picks it up. We also discussed that it is reasonable to hold the neratinib until she is finished with the molnupiravir and is asymptomatic.  She verbalized understanding of the plan.  She has a telephone visit scheduled with [name] on 01/29/22.  She has a follow-up visit with clinical pharmacy on 02/27/22 and knows to contact Dr. Geralyn Flash clinic sooner if she has any additional questions or her symptoms worsen.    Clinical pharmacy will continue to [place] and Dr. Nicholas Lose as needed.  Raina Mina, RPH-CPP,  01/25/2022  11:49 AM   **Disclaimer: This note was dictated with voice recognition software. Similar sounding words can inadvertently be transcribed and this note may contain transcription errors which may not have been corrected upon publication of note.**

## 2022-01-25 NOTE — Telephone Encounter (Signed)
Received call from pt stating she has been afebrile x 12 hours with the assistance of motrin.  Pt requesting advice from MD if okay to proceed with Molnupiravir.  Per MD pt still needing to take medication.  Pt educated to pick up prescription from pharmacy today.  Pt verbalized understanding.

## 2022-01-26 ENCOUNTER — Other Ambulatory Visit (HOSPITAL_COMMUNITY): Payer: Self-pay

## 2022-01-27 ENCOUNTER — Other Ambulatory Visit (HOSPITAL_COMMUNITY): Payer: Self-pay

## 2022-01-29 ENCOUNTER — Encounter: Payer: Self-pay | Admitting: Hematology and Oncology

## 2022-01-29 ENCOUNTER — Inpatient Hospital Stay: Payer: BC Managed Care – PPO | Attending: Hematology and Oncology | Admitting: Hematology and Oncology

## 2022-01-29 ENCOUNTER — Inpatient Hospital Stay: Payer: BC Managed Care – PPO

## 2022-01-29 DIAGNOSIS — C50811 Malignant neoplasm of overlapping sites of right female breast: Secondary | ICD-10-CM

## 2022-01-29 DIAGNOSIS — Z170 Estrogen receptor positive status [ER+]: Secondary | ICD-10-CM | POA: Diagnosis not present

## 2022-01-29 NOTE — Assessment & Plan Note (Signed)
06/21/2020:Patient palpated a right breast mass x 1 wk. Mammogram showed multiple confluent masses in the right breast at the 7 o'clock position measuring 2.3cm and at the 9 o'clock position measuring 2.2cm with one right axillary lymph node with cortical thickening. Biopsy showed invasive mammary carcinoma at both positions and in the axilla, grade 2-3, HER-2 equivocal by IHC (2+), positive by FISH (ratio 3.34), ER+ >95%, PR+ 25%, Ki67 60%.  Treatment plan: 1. Neoadjuvant chemotherapy with Tw[place] Perjeta 6 cyclescompleted 5/4/2022followed by HerceptinPerjeta versus Kadcylamaintenance for 1 yearcompleted 05/23/2021 2.Right mastectomy Teresa Sheppard): IDC, grade 3, spanning 4.0cm fibrotic area, clear margins, 1/4 right axillary lymph nodes positive for carcinoma, 0.5cm. Left lumpectomy: no evidence of malignancy 3. Followed by adjuvant radiation therapycompleted 02/17/2021 4.Followed by adjuvant antiestrogen therapystarted 12/29/2020 5.Followed by neratinib(to be started July 2023) ---------------------------------------------------------------------------------------------------------------------------------- Current Treatment:letrozole   Letrozole toxicities: Tolerating it extremely well.  Surveillance: 1.[facility]: Benign Bone density: November 2022: T score -2.4:  2.Breast MRI: 11/17/2021: Benign breast density category C  Current Treatment: Neratinib (to start last week of June), held for COVID-19 infection

## 2022-02-06 DIAGNOSIS — N390 Urinary tract infection, site not specified: Secondary | ICD-10-CM | POA: Diagnosis not present

## 2022-02-06 DIAGNOSIS — R3914 Feeling of incomplete bladder emptying: Secondary | ICD-10-CM | POA: Diagnosis not present

## 2022-02-06 DIAGNOSIS — R350 Frequency of micturition: Secondary | ICD-10-CM | POA: Diagnosis not present

## 2022-02-14 ENCOUNTER — Other Ambulatory Visit: Payer: Self-pay | Admitting: Pharmacist

## 2022-02-14 ENCOUNTER — Other Ambulatory Visit (HOSPITAL_COMMUNITY): Payer: Self-pay

## 2022-02-14 ENCOUNTER — Other Ambulatory Visit: Payer: Self-pay | Admitting: Hematology and Oncology

## 2022-02-14 MED ORDER — NERATINIB MALEATE 40 MG PO TABS
200.0000 mg | ORAL_TABLET | Freq: Every day | ORAL | 1 refills | Status: DC
  Filled 2022-02-14: qty 70, 14d supply, fill #0
  Filled 2022-02-27: qty 70, 14d supply, fill #1

## 2022-02-14 MED ORDER — NERATINIB MALEATE 40 MG PO TABS
200.0000 mg | ORAL_TABLET | Freq: Every day | ORAL | 1 refills | Status: DC
  Filled 2022-02-14: qty 120, 24d supply, fill #0

## 2022-02-14 NOTE — Telephone Encounter (Signed)
Please review and refill, thanks!

## 2022-02-14 NOTE — Therapy (Unsigned)
OUTPATIENT PHYSICAL THERAPY TREATMENT NOTE   Patient Name: Teresa Sheppard MRN: [id number] DOB:1961/01/21, 61 y.o., female Today's Date: 02/15/2022  PCP: Wenda Low, MD REFERRING PROVIDER:  Nicholas Lose, MD    END OF SESSION:   PT End of Session - 02/15/22 1240     Visit Number 6    Date for PT Re-Evaluation 04/11/22    Authorization Type BC[employer]    PT Start Time 0932    PT Stop Time 1320    PT Time Calculation (min) 45 min    Activity Tolerance Patient tolerated treatment well    Behavior During Therapy WFL for tasks assessed/performed                 Past Medical History:  Diagnosis Date   Anxiety    Breast cancer in female [facility])    Right   Family history of breast cancer 11/07/2020   Family history of pancreatic cancer 11/07/2020   History of kidney stones 2014-2015   Hyperthyroidism    No longer an issue   PONV (postoperative nausea and vomiting)    Thyromegaly    Past Surgical History:  Procedure Laterality Date   ABDOMINAL WALL DEFECT REPAIR     following hernia repair in 2010   [place]     2008  while pregnant with last child. Scar tissue from appendectomy wrapped around bowel and was snipped to free bowel.    CESAREAN SECTION     DILATION AND CURETTAGE OF UTERUS     2000 and 2014   HERNIA REPAIR     MASTECTOMY W/ SENTINEL NODE BIOPSY Right 11/28/2020   Procedure: RIGHT MASTECTOMY WITH RIGHT AXILLARY SENTINEL LYMPH NODE BIOPSY;  Surgeon: [name], MD;  Location: [place];  Service: General;  Laterality: Right;   PORT-A-CATH REMOVAL Left 01/30/2021   Procedure: PORT REMOVAL;  Surgeon: [name], MD;  Location: [place];  Service: General;  Laterality: Left;  LOCAL   PORTACATH PLACEMENT Left 07/12/2020   Procedure: INSERTION PORT-A-CATH WITH ULTRASOUND GUIDANCE;  Surgeon: [name], MD;  Location: [place];  Service: General;  Laterality: Left;   RADIOACTIVE SEED GUIDED AXILLARY SENTINEL LYMPH NODE Right  11/28/2020   Procedure: RADIOACTIVE SEED GUIDED RIGHT AXILLARY AXILLARY SENTINEL LYMPH NODE EXCISION;  Surgeon: [name], MD;  Location: [place];  Service: General;  Laterality: Right;   RADIOACTIVE SEED GUIDED EXCISIONAL BREAST BIOPSY Left 11/28/2020   Procedure: RADIOACTIVE SEED GUIDED LEFT EXCISIONAL BREAST BIOPSY;  Surgeon: [name], MD;  Location: [place];  Service: General;  Laterality: Left;   ROOT CANAL     2019   TONSILLECTOMY  1969   URETERAL EXPLORATION     dilataion for congenitally small ureter   Patient Active Problem List   Diagnosis Date Noted   Chemotherapy-induced peripheral neuropathy ([place]) 10/24/2021   Vitamin D deficiency 08/30/2021   Osteoporosis 08/30/2021   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   Genetic testing 11/15/2020   Family history of breast cancer 11/07/2020   Family history of pancreatic cancer 11/07/2020   Dysphagia 10/26/2020   Malignant neoplasm of overlapping sites of right breast in female, estrogen receptor positive ([place]) 06/21/2020   Hyperthyroidism 03/28/2016    REFERRING DIAG: C50.811,Z17.0 (ICD-10-CM) - Malignant neoplasm of overlapping sites of right breast in female, estrogen receptor positive ([place])  THERAPY DIAG:  Muscle weakness (generalized)  Other muscle spasm  Rationale for Evaluation and Treatment Rehabilitation  PERTINENT HISTORY: Hernia repair 2010, appendectomy, bowel resection,  c-section, breast cancer, chronic UTI  PRECAUTIONS: None  SUBJECTIVE: I have been fine with pelvic floor issues other than nocturnal voiding.  I have had back pain for the last two days  PAIN:  Are you having pain? Yes NPRS scale: (none given)/10 Pain location: low back Pain orientation: Medial  PAIN TYPE: aching Pain description: aching  Aggravating factors: not sure Relieving factors: it is getting better, not sure    OBJECTIVE: (objective measures completed at initial evaluation unless otherwise dated)  PATIENT  SURVEYS:      PFIQ-7 POPIQ = 19 (eval); POPIQ = 10 (02/15/22)   COGNITION:            Overall cognitive status: Within functional limits for tasks assessed                              MUSCLE LENGTH: Hamstrings: Right 80 deg; Left 70 deg     LUMBAR SPECIAL TESTS:  ASLR - harder on left but a little easier with compression   FUNCTIONAL TESTS:    SLS trendelenburg Rt side; unsteady bil GAIT:   Comments: WFL   POSTURE:  Rt hip elevated in standing   LUMBARAROM/PROM   A/PROM A/PROM  10/31/2021  Flexion 75%  Extension    Right lateral flexion    Left lateral flexion    Right rotation    Left rotation     (Blank rows = not tested)   LE ROM:   Passive ROM Right 10/31/2021 Left 10/31/2021  Hip flexion 80% 90%  Hip extension      Hip abduction      Hip adduction      Hip internal rotation 50% 75%  Hip external rotation 50% 75%  Knee flexion      Knee extension      Ankle dorsiflexion      Ankle plantarflexion      Ankle inversion      Ankle eversion       (Blank rows = not tested)   LE MMT:   MMT Right 10/31/2021 Left 10/31/2021  Hip flexion      Hip extension      Hip abduction      Hip adduction      Hip internal rotation      Hip external rotation      Knee flexion      Knee extension      Ankle dorsiflexion      Ankle plantarflexion      Ankle inversion      Ankle eversion        PELVIC MMT:   MMT   10/31/2021  Vaginal 2/5 (weak squeeze, definite lift)  Internal Anal Sphincter    External Anal Sphincter    Puborectalis    Diastasis Recti    (Blank rows = not tested)         PALPATION:   General  tension on the Rt flank and slight side bend in standing                 External Perineal Exam descending perineal body                             Internal Pelvic Floor decreased closure and TTP Lt side more than Rt; difficulty holding contraction and fatigues quickly with reps; stiff muscles   TONE: high   PROLAPSE: None  observed - did not do  bearing down   TODAY'S TREATMENT  Date: 01/15/22 Self care:  educated on more hydration in the morning and throughout the day, then no drinking after dinner Educated on release to vaginal canal with vitamin e oil at night Manual: Patient confirms identification and approves physical therapist to perform internal soft tissue work  Development worker, community - fascial release bil vaginal walls Thoracic paraspinals and rhomboids Rt; lumbar paraspinals bil Trigger Point Dry-Needling  Treatment instructions: Expect mild to moderate muscle soreness. S/S of pneumothorax if dry needled over a lung field, and to seek immediate medical attention should they occur. Patient verbalized understanding of these instructions and education.  Patient Consent Given: Yes Education handout provided: Yes Muscles treated: lumbar and thoracic multifidi Electrical stimulation performed: No Parameters: N/A Treatment response/outcome: increased soft tissue length palpated, twitch response in left lumbar   Date: 01/17/22  Qped UE reach, kegel and transversus abdominus activation - 20x Shoulder ext, row - red 20x staggered stance Dead lift 10lb 20x   Date: 2022-01-22  Patient confirms identification and approves physical therapist to perform biofeedback with external sensor placement.   Pt had to use bathroom and then sensors fell off so did tactile cues instead external to clothing Kegel in hook lying with and without ball squeeze Straight leg raises cues to decreased breath holding Review of HEP and frequency of stretches      PATIENT EDUCATION:  Education details: sample given; Access Code: [email or web address] Person educated: Patient Education method: Explanation Education comprehension: verbalized understanding     HOME EXERCISE PROGRAM: Access Code: [email or web address] URL: [email or web address] Date: 01/17/2022 Prepared by: Jari Favre  Exercises - Child's Pose with Thread the Needle  - 1 x daily - 7 x  weekly - 1 sets - 2 reps - Ball squeeze with Kegel  - 3 x daily - 7 x weekly - 1 sets - 10 reps - 3 sec hold - Shoulder extension with resistance - Neutral  - 1 x daily - 7 x weekly - 3 sets - 10 reps - Quadruped Exhale with Pelvic Floor Contraction  - 3 x daily - 7 x weekly - 1 sets - 10 reps - 3-5 hold - Half Deadlift with Kettlebell  - 1 x daily - 7 x weekly - 3 sets - 10 reps  Patient Education - Trigger Point Dry Needling   ASSESSMENT:   CLINICAL IMPRESSION: Pt is doing well in general and not had any issues with pelvic floor pain, but now having some low back pain.  She is also still having the same amount of nocturia.  Soft tissue releases done today with improved soft tissue length throughout the right side.  Pt was educated on how to do techniques at home to maintain improved soft tissue length that was obtained in today's treatment session. She will benefit from skilled PT to continue working on functional strength and improved soft tissue length for pain management.     OBJECTIVE IMPAIRMENTS decreased coordination, decreased endurance, decreased ROM, decreased strength, postural dysfunction, and pain.    ACTIVITY LIMITATIONS community activity and interpersonal relationships .    PERSONAL FACTORS 1-2 comorbidities: history of breast cancer, radiation, chemo, anti-estrogen medicine  are also affecting patient's functional outcome.      REHAB POTENTIAL: Excellent   CLINICAL DECISION MAKING: Evolving/moderate complexity   EVALUATION COMPLEXITY: Moderate     GOALS: Goals reviewed with patient? Yes   SHORT TERM GOALS: Target date: 11/28/2021   Ind with initial HEP  Baseline: 11/27/21  Goal status: met 12/05/21  2.  Ind with moisturizer for vaginal canal and has found one that feels good for reducing chaffing. Baseline: 01/03/22  Goal status: met       LONG TERM GOALS: Target date: 04/11/22  updated 02/15/22   Pt will be independent with advanced HEP to maintain  improvements made throughout therapy   Baseline:  Goal status: Ongoing   2.  Pt will report 75% reduction of pain due to improvements in posture, strength, and muscle length   Baseline: not having pain Goal status: Met   3.  Pt will have POPIQ score improved by least 5 points. Baseline: 10 - improved by 9 Goal status: MET   4.  Pt will have nocturia decreased to 1-2/night at most   Baseline: average 4/night, but it doesn't interrupt my sleep Goal status: IN PROGRESS   5.  Pt will not have pain with voiding due to improved skin integrity and muscle tone Baseline:  Goal status: met 6.  Pt will be able to do all functional exercises without leakage Baseline:  Goal status: IN PROGRESS        PLAN: PT FREQUENCY: 1x/week   PT DURATION: 12 weeks   PLANNED INTERVENTIONS: Therapeutic exercises, Therapeutic activity, Neuromuscular re-education, Balance training, Gait training, Patient/Family education, Joint mobilization, Dry Needling, Electrical stimulation, Spinal mobilization, Cryotherapy, Moist heat, Taping, Biofeedback, and Manual therapy   PLAN FOR NEXT SESSION: continue fascial releases around bladder and urethra; f/u on not drinking as much at night and f/u on vit e oil    Camillo Flaming Duey Liller, PT 02/15/2022, 2:32 PM

## 2022-02-15 ENCOUNTER — Encounter: Payer: Self-pay | Admitting: Physical Therapy

## 2022-02-15 ENCOUNTER — Ambulatory Visit: Payer: BC Managed Care – PPO | Attending: Hematology and Oncology | Admitting: Physical Therapy

## 2022-02-15 DIAGNOSIS — M62838 Other muscle spasm: Secondary | ICD-10-CM | POA: Diagnosis not present

## 2022-02-15 DIAGNOSIS — M6281 Muscle weakness (generalized): Secondary | ICD-10-CM | POA: Insufficient documentation

## 2022-02-16 ENCOUNTER — Other Ambulatory Visit (HOSPITAL_COMMUNITY): Payer: Self-pay

## 2022-02-19 ENCOUNTER — Encounter: Payer: Self-pay | Admitting: Hematology and Oncology

## 2022-02-27 ENCOUNTER — Inpatient Hospital Stay: Payer: BC Managed Care – PPO

## 2022-02-27 ENCOUNTER — Inpatient Hospital Stay: Payer: BC Managed Care – PPO | Attending: Hematology and Oncology | Admitting: Pharmacist

## 2022-02-27 VITALS — BP 118/70 | HR 50 | Temp 97.7°F | Resp 16 | Ht 70.0 in | Wt 147.6 lb

## 2022-02-27 DIAGNOSIS — C50919 Malignant neoplasm of unspecified site of unspecified female breast: Secondary | ICD-10-CM | POA: Insufficient documentation

## 2022-02-27 DIAGNOSIS — C50811 Malignant neoplasm of overlapping sites of right female breast: Secondary | ICD-10-CM

## 2022-02-27 DIAGNOSIS — Z170 Estrogen receptor positive status [ER+]: Secondary | ICD-10-CM

## 2022-02-27 LAB — CMP (CANCER CENTER ONLY)
ALT: 29 U/L (ref 0–44)
AST: 39 U/L (ref 15–41)
Albumin: 4.2 g/dL (ref 3.5–5.0)
Alkaline Phosphatase: 111 U/L (ref 38–126)
Anion gap: 4 — ABNORMAL LOW (ref 5–15)
BUN: 19 mg/dL (ref 6–20)
CO2: 30 mmol/L (ref 22–32)
Calcium: 9.6 mg/dL (ref 8.9–10.3)
Chloride: 106 mmol/L (ref 98–111)
Creatinine: 0.95 mg/dL (ref 0.44–1.00)
GFR, Estimated: >60 mL/min (ref >60)
Glucose, Bld: 95 mg/dL (ref 70–99)
Potassium: 4.1 mmol/L (ref 3.5–5.1)
Sodium: 140 mmol/L (ref 135–145)
Total Bilirubin: 0.4 mg/dL (ref 0.3–1.2)
Total Protein: 7 g/dL (ref 6.5–8.1)

## 2022-02-27 LAB — CBC WITH DIFFERENTIAL/PLATELET
Abs Immature Granulocytes: 0 10*3/uL (ref 0.00–0.07)
Basophils Absolute: 0 10*3/uL (ref 0.0–0.1)
Basophils Relative: 1 %
Eosinophils Absolute: 0.2 10*3/uL (ref 0.0–0.5)
Eosinophils Relative: 4 %
HCT: 34.4 % — ABNORMAL LOW (ref 36.0–46.0)
Hemoglobin: 11.7 g/dL — ABNORMAL LOW (ref 12.0–15.0)
Immature Granulocytes: 0 %
Lymphocytes Relative: 27 %
Lymphs Abs: 1 10*3/uL (ref 0.7–4.0)
MCH: 31.9 pg (ref 26.0–34.0)
MCHC: 34 g/dL (ref 30.0–36.0)
MCV: 93.7 fL (ref 80.0–100.0)
Monocytes Absolute: 0.4 10*3/uL (ref 0.1–1.0)
Monocytes Relative: 10 %
Neutro Abs: 2 10*3/uL (ref 1.7–7.7)
Neutrophils Relative %: 58 %
Platelets: 110 10*3/uL — ABNORMAL LOW (ref 150–400)
RBC: 3.67 MIL/uL — ABNORMAL LOW (ref 3.87–5.11)
RDW: 12.4 % (ref 11.5–15.5)
WBC: 3.5 10*3/uL — ABNORMAL LOW (ref 4.0–10.5)
nRBC: 0 % (ref 0.0–0.2)

## 2022-02-27 LAB — MAGNESIUM: Magnesium: 2 mg/dL (ref 1.7–2.4)

## 2022-02-27 NOTE — Progress Notes (Signed)
[place]       Telephone: [phone]?Fax: [phone]   Oncology Clinical Pharmacist Practitioner Progress Note  Teresa Sheppard was contacted via in person visit to discuss her chemotherapy regimen for neratinib which they receive under the care of Dr. Nicholas Lose.   Current treatment regimen and start date Neratinib (12/28/21) Letrozole (12/29/20)  Interval History She continues on neratinib 5 tablets (200 mg) by mouth daily on days 1 to 28 of a 28-day cycle. This is being given in combination with letrozole. Therapy is planned to continue until 1 year in the extended adjuvant setting.  Teresa Sheppard was seen today by clinical pharmacy as a follow-up to her neratinib management.  She last saw [name] on 01/29/22 and had a telephone encounter with clinical pharmacy on 01/25/22.  Response to Therapy Teresa Sheppard is doing well.  She is tolerating neratinib at 5 tablets daily and plans to increase to 6 tablets daily starting this evening.  She knows that if she is unable to tolerate 6 tablets that she can go back down to the 5 tablet daily dose.  She continues to have some loose stool which is controlled with loperamide.  Her liver elevations that were present at her last visit have now normalized.  She continues to separate out her calcium and neratinib by at least 3 hours.  She also continues to exercise, eat a healthy diet, and take her medications appropriately.  She seems to have recovered well from having COVID.  She will next see [name] in October and if tolerating everything well, she will then be seen every 3 months per neratinib manufacturing guideline recommendations.  She knows that she can contact the clinic or be seen sooner if any questions or concerns arise in the interim. Labs, vitals, treatment parameters, and manufacturer guidelines assessing toxicity were reviewed with Teresa Sheppard today. Based on these values, patient is in agreement to continue neratinib  therapy at this time.  Allergies Allergies  Allergen Reactions   Shellfish-Derived Products Anaphylaxis    Per patient whenever she consumes a lot gives tightness of throat   Ciprofibrate Other (See Comments)    Joint pain    Oxycodone Hcl    Shrimp Extract Allergy Skin Test    Hydrocodone Anxiety and Nausea Only   Penicillins Rash    Vitals    02/27/2022    2:07 PM 01/01/2022    1:59 PM 12/25/2021   12:40 AM  Vitals with BMI  Height '5\' 10"'$  '5\' 10"'$    Weight 147 lbs 10 oz 143 lbs 5 oz   BMI 61.60 73.71   Systolic 062 694 854  Diastolic 70 68 66  Pulse 50 52 64   Temp Readings from Last 3 Encounters:  02/27/22 97.7 F (36.5 C) (Temporal)  01/01/22 97.7 F (36.5 C) (Tympanic)  12/25/21 98.3 F (36.8 C) (Oral)     Laboratory Data    Latest Ref Rng & Units 02/27/2022    1:52 PM 01/01/2022    1:18 PM 12/18/2021   10:43 AM  CBC EXTENDED  WBC 4.0 - 10.5 K/uL 3.5  3.7  4.1   RBC 3.87 - 5.11 MIL/uL 3.67  3.93  4.07   Hemoglobin 12.0 - 15.0 g/dL 11.7  12.7  12.9   HCT 36.0 - 46.0 % 34.4  37.2  38.7   Platelets 150 - 400 K/uL 110  144  131   NEUT# 1.7 - 7.7 K/uL 2.0  2.2  3.0   Lymph# 0.7 - 4.0 K/uL 1.0  0.9  0.4        Latest Ref Rng & Units 02/27/2022    1:52 PM 01/01/2022    1:18 PM 12/18/2021   10:43 AM  CMP  Glucose 70 - 99 mg/dL 95  81  125   BUN 6 - 20 mg/dL '19  17  13   '$ Creatinine 0.44 - 1.00 mg/dL 0.95  0.98  0.75   Sodium 135 - 145 mmol/L 140  139  139   Potassium 3.5 - 5.1 mmol/L 4.1  4.0  4.0   Chloride 98 - 111 mmol/L 106  104  105   CO2 22 - 32 mmol/L '30  30  28   '$ Calcium 8.9 - 10.3 mg/dL 9.6  9.9  9.5   Total Protein 6.5 - 8.1 g/dL 7.0  7.8  7.5   Total Bilirubin 0.3 - 1.2 mg/dL 0.4  0.6  0.7   Alkaline Phos 38 - 126 U/L 111  146  210   AST 15 - 41 U/L 39  44  88   ALT 0 - 44 U/L 29  36  106     Lab Results  Component Value Date   MG 2.0 02/27/2022   MG 2.1 03/07/2021     Adverse Effects Assessment Diarrhea: Controlled with loperamide  Adherence  Assessment Teresa Sheppard reports missing 0 doses over the past 4 weeks due to adherence.  She did miss some days due to Teresa Sheppard.   Reason for missed dose: Had COVID and out of abundance of caution was told to hold neratinib until symptoms dissipated Patient was re-educated on importance of adherence.   Access Assessment Teresa Sheppard is currently receiving her neratinib through [facility] concerns: None  Medication Reconciliation The patient's medication list was reviewed today with the patient?  Yes New medications or herbal supplements have recently been started?  Yes, added topical thymol and topical mometasone at patient's request. Any medications have been discontinued?  Yes, removed hydrocortisone cream and trimethoprim per patient's request The medication list was updated and reconciled based on the patient's most recent medication list in the electronic medical record (EMR) including herbal products and OTC medications.   Medications Current Outpatient Medications  Medication Sig Dispense Refill   acetaminophen (TYLENOL) 325 MG tablet Take 325 mg by mouth every 6 (six) hours as needed (headaches / pain).     Calcium-Vitamin D-Vitamin K ([employer] CALCIUM SOFT CHEWS) 650-12.5-40 MG-MCG-MCG CHEW Chew 1 tablet by mouth 2 (two) times daily.     cholecalciferol (VITAMIN D3) 25 MCG (1000 UNIT) tablet Take 2,000 Units by mouth daily.     Lactobacillus-Inulin (CULTURELLE ADULT ULT BALANCE PO) Take by mouth daily.     letrozole (FEMARA) 2.5 MG tablet Take 1 tablet (2.5 mg total) by mouth daily. 90 tablet 3   loratadine (CLARITIN) 10 MG tablet Take 10 mg by mouth daily.     mometasone (ELOCON) 0.1 % cream Apply 1 Application topically at bedtime.     Multiple Vitamins-Minerals (HAIR/SKIN/NAILS/BIOTIN PO) Take 1 capsule by mouth daily.     Neratinib Maleate (NERLYNX) 40 MG tablet Take 5 tablets (200 mg total) by mouth daily. Take with food. 140 tablet 1   Polyethyl  Glycol-Propyl Glycol (SYSTANE OP) Place 1 drop into both eyes daily as needed (dry eyes).     Thymol CRYS by Does not apply route. Apply 1-2 drops to nailbed 2 times a  day     No current facility-administered medications for this visit.    Drug-Drug Interactions (DDIs) DDIs were evaluated?  Yes Significant DDIs?  Yes, Teresa Sheppard knows to separate out calcium carbonate from neratinib by at least 3 hours The patient was instructed to speak with their health care provider and/or the oral chemotherapy pharmacist before starting any new drug, including prescription or over the counter, natural / herbal products, or vitamins.  Supportive Care Continue loperamide as needed for diarrhea  Dosing Assessment Hepatic adjustments needed?  No Renal adjustments needed?  No Toxicity adjustments needed?  No The current dosing regimen is appropriate to continue at this time.  Follow-Up Plan Continue neratinib, will increase to 6 tablets (240 mg) daily with food starting this evening.  She knows that she can lower her dose of neratinib if she does not tolerate the 240 mg dose. Continue letrozole 2.5 mg by mouth daily Continue loperamide as needed for diarrhea Labs, [name] visit, on 03/27/22.  If tolerating neratinib at this time, manufacturing guidelines recommend every 29-monthlabs and monitoring. Labs, pharmacy clinic visit, on 06/26/21  CErick BlinksBlacutt participated in the discussion, expressed understanding, and voiced agreement with the above plan. All questions were answered to her satisfaction. The patient was advised to contact the clinic at [phone] with any questions or concerns prior to her return visit.   I spent 30 minutes assessing and educating the patient.  JRaina Mina RPH-CPP, 02/27/2022  2:41 PM   **Disclaimer: This note was dictated with voice recognition software. Similar sounding words can inadvertently be transcribed and this note may contain transcription errors which  may not have been corrected upon publication of note.**

## 2022-02-28 ENCOUNTER — Other Ambulatory Visit (HOSPITAL_COMMUNITY): Payer: Self-pay

## 2022-02-28 ENCOUNTER — Telehealth: Payer: Self-pay | Admitting: Hematology and Oncology

## 2022-02-28 ENCOUNTER — Other Ambulatory Visit: Payer: Self-pay | Admitting: Pharmacist

## 2022-02-28 MED ORDER — NERATINIB MALEATE 40 MG PO TABS
240.0000 mg | ORAL_TABLET | Freq: Every day | ORAL | 10 refills | Status: DC
  Filled 2022-02-28: qty 90, 15d supply, fill #0
  Filled 2022-03-09: qty 90, 15d supply, fill #1
  Filled 2022-03-20: qty 90, 15d supply, fill #2
  Filled 2022-04-17: qty 90, 15d supply, fill #3
  Filled 2022-05-03: qty 90, 15d supply, fill #4
  Filled 2022-05-18 – 2022-05-31 (×3): qty 90, 15d supply, fill #5
  Filled 2022-06-11: qty 90, 15d supply, fill #6
  Filled 2022-07-20 – 2022-07-23 (×2): qty 90, 15d supply, fill #7
  Filled 2022-08-07: qty 168, 28d supply, fill #8

## 2022-02-28 NOTE — Telephone Encounter (Signed)
Contacted patient to scheduled appointments. Patient is aware of appointments that are scheduled.   

## 2022-03-01 ENCOUNTER — Other Ambulatory Visit (HOSPITAL_COMMUNITY): Payer: Self-pay

## 2022-03-02 ENCOUNTER — Other Ambulatory Visit: Payer: Self-pay | Admitting: Hematology and Oncology

## 2022-03-05 DIAGNOSIS — C50911 Malignant neoplasm of unspecified site of right female breast: Secondary | ICD-10-CM | POA: Diagnosis not present

## 2022-03-06 DIAGNOSIS — C50911 Malignant neoplasm of unspecified site of right female breast: Secondary | ICD-10-CM | POA: Diagnosis not present

## 2022-03-07 DIAGNOSIS — D696 Thrombocytopenia, unspecified: Secondary | ICD-10-CM | POA: Diagnosis not present

## 2022-03-07 DIAGNOSIS — E785 Hyperlipidemia, unspecified: Secondary | ICD-10-CM | POA: Diagnosis not present

## 2022-03-07 DIAGNOSIS — E559 Vitamin D deficiency, unspecified: Secondary | ICD-10-CM | POA: Diagnosis not present

## 2022-03-07 DIAGNOSIS — C50919 Malignant neoplasm of unspecified site of unspecified female breast: Secondary | ICD-10-CM | POA: Diagnosis not present

## 2022-03-09 ENCOUNTER — Other Ambulatory Visit (HOSPITAL_COMMUNITY): Payer: Self-pay

## 2022-03-13 ENCOUNTER — Other Ambulatory Visit (HOSPITAL_COMMUNITY): Payer: Self-pay

## 2022-03-14 ENCOUNTER — Other Ambulatory Visit: Payer: Self-pay | Admitting: Surgery

## 2022-03-14 DIAGNOSIS — E042 Nontoxic multinodular goiter: Secondary | ICD-10-CM

## 2022-03-14 DIAGNOSIS — E049 Nontoxic goiter, unspecified: Secondary | ICD-10-CM

## 2022-03-15 ENCOUNTER — Ambulatory Visit: Payer: BC Managed Care – PPO | Attending: Hematology and Oncology | Admitting: Physical Therapy

## 2022-03-15 DIAGNOSIS — M6281 Muscle weakness (generalized): Secondary | ICD-10-CM | POA: Diagnosis not present

## 2022-03-15 DIAGNOSIS — M62838 Other muscle spasm: Secondary | ICD-10-CM | POA: Insufficient documentation

## 2022-03-15 NOTE — Therapy (Addendum)
OUTPATIENT PHYSICAL THERAPY TREATMENT NOTE   Patient Name: Teresa Sheppard MRN: [id number] DOB:06/04/1961, 61 y.o., female Today's Date: 03/15/2022  PCP: Wenda Low, MD REFERRING PROVIDER:  Nicholas Lose, MD    END OF SESSION:   PT End of Session - 03/15/22 1411     Visit Number 7    Date for PT Re-Evaluation 04/11/22    Authorization Type BCBS    PT Start Time 1404    PT Stop Time 7902    PT Time Calculation (min) 39 min    Activity Tolerance Patient tolerated treatment well    Behavior During Therapy WFL for tasks assessed/performed                  Past Medical History:  Diagnosis Date   Anxiety    Breast cancer in female [facility])    Right   Family history of breast cancer 11/07/2020   Family history of pancreatic cancer 11/07/2020   History of kidney stones 2014-2015   Hyperthyroidism    No longer an issue   PONV (postoperative nausea and vomiting)    Thyromegaly    Past Surgical History:  Procedure Laterality Date   ABDOMINAL WALL DEFECT REPAIR     following hernia repair in 2010   [place]     2008  while pregnant with last child. Scar tissue from appendectomy wrapped around bowel and was snipped to free bowel.    CESAREAN SECTION     DILATION AND CURETTAGE OF UTERUS     2000 and 2014   HERNIA REPAIR     MASTECTOMY W/ SENTINEL NODE BIOPSY Right 11/28/2020   Procedure: RIGHT MASTECTOMY WITH RIGHT AXILLARY SENTINEL LYMPH NODE BIOPSY;  Surgeon: [name], MD;  Location: [place];  Service: General;  Laterality: Right;   PORT-A-CATH REMOVAL Left 01/30/2021   Procedure: PORT REMOVAL;  Surgeon: [name], MD;  Location: [place];  Service: General;  Laterality: Left;  LOCAL   PORTACATH PLACEMENT Left 07/12/2020   Procedure: INSERTION PORT-A-CATH WITH ULTRASOUND GUIDANCE;  Surgeon: [name], MD;  Location: [place];  Service: General;  Laterality: Left;   RADIOACTIVE SEED GUIDED AXILLARY SENTINEL LYMPH NODE Right  11/28/2020   Procedure: RADIOACTIVE SEED GUIDED RIGHT AXILLARY AXILLARY SENTINEL LYMPH NODE EXCISION;  Surgeon: [name], MD;  Location: [place];  Service: General;  Laterality: Right;   RADIOACTIVE SEED GUIDED EXCISIONAL BREAST BIOPSY Left 11/28/2020   Procedure: RADIOACTIVE SEED GUIDED LEFT EXCISIONAL BREAST BIOPSY;  Surgeon: [name], MD;  Location: [place];  Service: General;  Laterality: Left;   ROOT CANAL     2019   TONSILLECTOMY  1969   URETERAL EXPLORATION     dilataion for congenitally small ureter   Patient Active Problem List   Diagnosis Date Noted   Chemotherapy-induced peripheral neuropathy ([place]) 10/24/2021   Vitamin D deficiency 08/30/2021   Osteoporosis 08/30/2021   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  Rationale for Evaluation and Treatment Rehabilitation  PERTINENT HISTORY: Hernia repair 2010, appendectomy, bowel resection, c-section, breast cancer,  chronic UTI  PRECAUTIONS: None  SUBJECTIVE: I have been using more moisturizing  PAIN:  Are you having pain? No     OBJECTIVE: (objective measures completed at initial evaluation unless otherwise dated)  PATIENT SURVEYS:      PFIQ-7 POPIQ = 19 (eval); POPIQ = 10 (02/15/22)   COGNITION:            Overall cognitive status: Within functional limits for tasks assessed                              MUSCLE LENGTH: Hamstrings: Right 80 deg; Left 70 deg     LUMBAR SPECIAL TESTS:  ASLR - harder on left but a little easier with  compression   FUNCTIONAL TESTS:    SLS trendelenburg Rt side; unsteady bil GAIT:   Comments: WFL   POSTURE:  Rt hip elevated in standing   LUMBARAROM/PROM   A/PROM A/PROM  10/31/2021  Flexion 75%  Extension    Right lateral flexion    Left lateral flexion    Right rotation    Left rotation     (Blank rows = not tested)   LE ROM:   Passive ROM Right 10/31/2021 Left 10/31/2021  Hip flexion 80% 90%  Hip extension      Hip abduction      Hip adduction      Hip internal rotation 50% 75%  Hip external rotation 50% 75%  Knee flexion      Knee extension      Ankle dorsiflexion      Ankle plantarflexion      Ankle inversion      Ankle eversion       (Blank rows = not tested)   LE MMT:   MMT Right 10/31/2021 Left 10/31/2021  Hip flexion      Hip extension      Hip abduction      Hip adduction      Hip internal rotation      Hip external rotation      Knee flexion      Knee extension      Ankle dorsiflexion      Ankle plantarflexion      Ankle inversion      Ankle eversion        PELVIC MMT:   MMT   10/31/2021  Vaginal 2/5 (weak squeeze, definite lift)  Internal Anal Sphincter    External Anal Sphincter    Puborectalis    Diastasis Recti    (Blank rows = not tested)         PALPATION:   General  tension on the Rt flank and slight side bend in standing                 External Perineal Exam descending perineal body                             Internal Pelvic Floor decreased closure and TTP Lt side more than Rt; difficulty holding contraction and fatigues quickly with reps; stiff muscles   TONE: high   PROLAPSE: None observed - did not do bearing down   03/15/22 - observed bearing down with minimal anterior wall prolapse; observed in standing and no prolapse noted  TODAY'S TREATMENT  Date: 03/15/22 Self care:   Educated on prolapse and pt wants to check due to feeling a bulge anterior  vaginal wall when applying moisturizer - see above assessment of  prolapse Reviewed moisturizing and fascial release to vaginal canal nightly Exercise: Pallof press red band 10x   Date: 01/15/22 Self care:  educated on more hydration in the morning and throughout the day, then no drinking after dinner Educated on release to vaginal canal with vitamin e oil at night Manual: Patient confirms identification and approves physical therapist to perform internal soft tissue work  Development worker, community - fascial release bil vaginal walls Thoracic paraspinals and rhomboids Rt; lumbar paraspinals bil Trigger Point Dry-Needling  Treatment instructions: Expect mild to moderate muscle soreness. S/S of pneumothorax if dry needled over a lung field, and to seek immediate medical attention should they occur. Patient verbalized understanding of these instructions and education.  Patient Consent Given: Yes Education handout provided: Yes Muscles treated: lumbar and thoracic multifidi Electrical stimulation performed: No Parameters: N/A Treatment response/outcome: increased soft tissue length palpated, twitch response in left lumbar   Date: 01/17/22  Qped UE reach, kegel and transversus abdominus activation - 20x Shoulder ext, row - red 20x staggered stance Dead lift 10lb 20x   Date: 2022-01-27  Patient confirms identification and approves physical therapist to perform biofeedback with external sensor placement.   Pt had to use bathroom and then sensors fell off so did tactile cues instead external to clothing Kegel in hook lying with and without ball squeeze Straight leg raises cues to decreased breath holding Review of HEP and frequency of stretches      PATIENT EDUCATION:  Education details: sample given; Access Code: [email or web address] Person educated: Patient Education method: Explanation Education comprehension: verbalized understanding     HOME EXERCISE PROGRAM: Access Code: [email or web address] URL: [email or web address] Date: 01/17/2022 Prepared by:  Jari Favre  Exercises - Child's Pose with Thread the Needle  - 1 x daily - 7 x weekly - 1 sets - 2 reps - Ball squeeze with Kegel  - 3 x daily - 7 x weekly - 1 sets - 10 reps - 3 sec hold - Shoulder extension with resistance - Neutral  - 1 x daily - 7 x weekly - 3 sets - 10 reps - Quadruped Exhale with Pelvic Floor Contraction  - 3 x daily - 7 x weekly - 1 sets - 10 reps - 3-5 hold - Half Deadlift with Kettlebell  - 1 x daily - 7 x weekly - 3 sets - 10 reps  Patient Education - Trigger Point Dry Needling   ASSESSMENT:   CLINICAL IMPRESSION: Pt is improving overall and reports less nocturia. Today's session focused more on core strengthening for core and posture support.  She needed cues during session to maintain posture and activate the muscles correctly.  Pt will benefit from skilled PT to continue to progress strength for improved core stability.     OBJECTIVE IMPAIRMENTS decreased coordination, decreased endurance, decreased ROM, decreased strength, postural dysfunction, and pain.    ACTIVITY LIMITATIONS community activity and interpersonal relationships .    PERSONAL FACTORS 1-2 comorbidities: history of breast cancer, radiation, chemo, anti-estrogen medicine  are also affecting patient's functional outcome.      REHAB POTENTIAL: Excellent   CLINICAL DECISION MAKING: Evolving/moderate complexity   EVALUATION COMPLEXITY: Moderate     GOALS: Goals reviewed with patient? Yes   SHORT TERM GOALS: Target date: 11/28/2021   Ind with initial HEP Baseline: 11/27/21  Goal status: met 12/05/21  2.  Ind with moisturizer for vaginal canal and has found one that feels good for reducing chaffing.  Baseline: 01/03/22  Goal status: met       LONG TERM GOALS: Target date: 04/11/22  updated 02/15/22   Pt will be independent with advanced HEP to maintain improvements made throughout therapy   Baseline:  Goal status: Ongoing   2.  Pt will report 75% reduction of pain  due to improvements in posture, strength, and muscle length   Baseline: not having pain Goal status: Met   3.  Pt will have POPIQ score improved by least 5 points. Baseline: 10 - improved by 9 Goal status: MET   4.  Pt will have nocturia decreased to 1-2/night at most   Baseline: average 4/night, but it doesn't interrupt my sleep Goal status: IN PROGRESS   5.  Pt will not have pain with voiding due to improved skin integrity and muscle tone Baseline:  Goal status: met 6.  Pt will be able to do all functional exercises without leakage Baseline:  Goal status: IN PROGRESS        PLAN: PT FREQUENCY: 1x/week   PT DURATION: 12 weeks   PLANNED INTERVENTIONS: Therapeutic exercises, Therapeutic activity, Neuromuscular re-education, Balance training, Gait training, Patient/Family education, Joint mobilization, Dry Needling, Electrical stimulation, Spinal mobilization, Cryotherapy, Moist heat, Taping, Biofeedback, and Manual therapy   PLAN FOR NEXT SESSION: continue fascial releases around bladder and urethra; f/u on not drinking as much at night and f/u on vit e oil    Camillo Flaming Dalonte Hardage, PT 03/15/2022, 2:11 PM

## 2022-03-20 ENCOUNTER — Other Ambulatory Visit (HOSPITAL_COMMUNITY): Payer: Self-pay

## 2022-03-22 ENCOUNTER — Inpatient Hospital Stay: Payer: BC Managed Care – PPO | Admitting: Pharmacist

## 2022-03-22 DIAGNOSIS — Z170 Estrogen receptor positive status [ER+]: Secondary | ICD-10-CM

## 2022-03-22 MED ORDER — PROCHLORPERAZINE MALEATE 10 MG PO TABS: 10.0000 mg | ORAL_TABLET | Freq: Four times a day (QID) | ORAL | 2 refills | Status: DC | PRN

## 2022-03-22 MED ORDER — ONDANSETRON HCL 8 MG PO TABS
8.0000 mg | ORAL_TABLET | Freq: Three times a day (TID) | ORAL | 2 refills | Status: DC | PRN
Start: 2022-03-22 — End: 2022-06-28

## 2022-03-22 NOTE — Progress Notes (Signed)
[place]       Telephone: [phone]?Fax: [phone]   Oncology Clinical Pharmacist Practitioner Progress Note  Teresa Sheppard is a 61 y.o. female with a diagnosis of breast cancer currently on extended adjuvant neratinib in combination with letrozole under the care of Dr. Nicholas Lose. They were contacted today via telephone.  I connected with Nazifa Trinka Schiltz on 03/22/22 at  1:30 PM EDT by telephone and verified that I am speaking with the correct person using two identifiers.   Patient's location: home  Provider's location: clinic   Interval History Ms. Vukelich contacted Dr. Geralyn Flash nurse today and clinical pharmacy contacted Ms. Crisanto back via telephone.  She states that she has been tolerating her current neratinib dose of 6 tablets daily with food fairly well.  She has been on her current dose since 02/27/22.   She is experiencing only minor diarrhea and has not needed to take loperamide since increasing her neratinib dose.  She is using non-pharmacological resources such as eating applesauce, bananas, and using a probiotic.  She does feel that her fatigue has increased slightly but she remains very active.  She has noticed that her nausea has increased slightly, she is not having any vomiting at this time.  She is also not been taking any antinausea medications.  We did discuss as needed ondansetron and prochlorperazine and she is in agreement to try these should her nausea come back.  These prescriptions will be sent to her pharmacy of choice.  Ms. Nanez also reports some dizziness while seeing physical therapy and doing exercises with them.  She states that she was practicing certain yoga poses where she was feeling dizzy and her physical therapist has told her to stop doing these exercises at this time.  She also notices having some dizziness at night.  She feels that she is drinking plenty of fluids, she is not noticing dizziness when she goes from a laying  position to a sitting position or a sitting position to a standing position.  She is not reporting blurred vision and reports only occasional headaches that are "fleeting" and respond to acetaminophen.  She does have a follow-up appointment with [name] next week with labs.  We have encouraged her at this point to increase her fluid intake and she will also reduce her neratinib to 5 tablets starting this evening and may continue at this dose until she sees [name] next week.  She states that if she does start feeling better she may want to increase her dose back up to 6 tablets before she sees him.  We felt this to be reasonable if she tolerates it.  We did discuss with her that should her symptoms worsen, that she should contact Dr. Geralyn Flash clinic and likely be seen prior to her visit next week.  She verbalized understanding of the plan and is in agreement.  Follow-Up Plan Prescriptions for ondansetron and prochlorperazine sent to her pharmacy of choice.  She can take these as needed for nausea She will reduce her neratinib dose to 5 tablets daily with food starting this evening.  She states that she may want to increase back up to 6 tablets prior to her visit with Dr. Ladona Mow next week. She will continue letrozole 2.5 mg daily She will continue to monitor her dizziness and nausea closely and contact Dr. Geralyn Flash clinic should her symptoms worsen or have any questions in the interim. Labs, [name] visit, on 03/29/22 Labs, pharmacy  clinic visit, on 04/26/22  Erick Blinks Sattler participated in the discussion, expressed understanding, and voiced agreement with the above plan. All questions were answered to her satisfaction. The patient was advised to contact the clinic at [phone] with any questions or concerns prior to her return visit.  Clinical pharmacy will continue to [place] and [name] as needed.  I spent 30 minutes assessing the patient.  Raina Mina, RPH-CPP,   03/22/2022  1:50 PM   **Disclaimer: This note was dictated with voice recognition software. Similar sounding words can inadvertently be transcribed and this note may contain transcription errors which may not have been corrected upon publication of note.**

## 2022-03-27 ENCOUNTER — Encounter: Payer: Self-pay | Admitting: Physical Therapy

## 2022-03-27 ENCOUNTER — Ambulatory Visit: Payer: BC Managed Care – PPO | Attending: Hematology and Oncology | Admitting: Physical Therapy

## 2022-03-27 DIAGNOSIS — M62838 Other muscle spasm: Secondary | ICD-10-CM | POA: Insufficient documentation

## 2022-03-27 DIAGNOSIS — M6281 Muscle weakness (generalized): Secondary | ICD-10-CM | POA: Insufficient documentation

## 2022-03-27 DIAGNOSIS — N952 Postmenopausal atrophic vaginitis: Secondary | ICD-10-CM | POA: Diagnosis not present

## 2022-03-27 DIAGNOSIS — N390 Urinary tract infection, site not specified: Secondary | ICD-10-CM | POA: Diagnosis not present

## 2022-03-27 DIAGNOSIS — N94810 Vulvar vestibulitis: Secondary | ICD-10-CM | POA: Diagnosis not present

## 2022-03-27 NOTE — Therapy (Signed)
OUTPATIENT PHYSICAL THERAPY TREATMENT NOTE   Patient Name: Teresa Sheppard MRN: [id number] DOB:1961-04-29, 61 y.o., female Today's Date: 03/27/2022  PCP: Wenda Low, MD REFERRING PROVIDER:  Nicholas Lose, MD    END OF SESSION:   PT End of Session - 03/27/22 1547     Visit Number 8    Date for PT Re-Evaluation 04/11/22    Authorization Type BCBS    PT Start Time 1543   late   PT Stop Time 1614    PT Time Calculation (min) 31 min    Activity Tolerance Patient tolerated treatment well    Behavior During Therapy WFL for tasks assessed/performed                   Past Medical History:  Diagnosis Date   Anxiety    Breast cancer in female [facility])    Right   Family history of breast cancer 11/07/2020   Family history of pancreatic cancer 11/07/2020   History of kidney stones 2014-2015   Hyperthyroidism    No longer an issue   PONV (postoperative nausea and vomiting)    Thyromegaly    Past Surgical History:  Procedure Laterality Date   ABDOMINAL WALL DEFECT REPAIR     following hernia repair in 2010   [place]     2008  while pregnant with last child. Scar tissue from appendectomy wrapped around bowel and was snipped to free bowel.    CESAREAN SECTION     DILATION AND CURETTAGE OF UTERUS     2000 and 2014   HERNIA REPAIR     MASTECTOMY W/ SENTINEL NODE BIOPSY Right 11/28/2020   Procedure: RIGHT MASTECTOMY WITH RIGHT AXILLARY SENTINEL LYMPH NODE BIOPSY;  Surgeon: [name], MD;  Location: [place];  Service: General;  Laterality: Right;   PORT-A-CATH REMOVAL Left 01/30/2021   Procedure: PORT REMOVAL;  Surgeon: [name], MD;  Location: [place];  Service: General;  Laterality: Left;  LOCAL   PORTACATH PLACEMENT Left 07/12/2020   Procedure: INSERTION PORT-A-CATH WITH ULTRASOUND GUIDANCE;  Surgeon: [name], MD;  Location: [place];  Service: General;  Laterality: Left;   RADIOACTIVE SEED GUIDED AXILLARY SENTINEL LYMPH NODE  Right 11/28/2020   Procedure: RADIOACTIVE SEED GUIDED RIGHT AXILLARY AXILLARY SENTINEL LYMPH NODE EXCISION;  Surgeon: [name], MD;  Location: [place];  Service: General;  Laterality: Right;   RADIOACTIVE SEED GUIDED EXCISIONAL BREAST BIOPSY Left 11/28/2020   Procedure: RADIOACTIVE SEED GUIDED LEFT EXCISIONAL BREAST BIOPSY;  Surgeon: [name], MD;  Location: [place];  Service: General;  Laterality: Left;   ROOT CANAL     2019   TONSILLECTOMY  1969   URETERAL EXPLORATION     dilataion for congenitally small ureter   Patient Active Problem List   Diagnosis Date Noted   Chemotherapy-induced peripheral neuropathy ([place]) 10/24/2021   Vitamin D deficiency 08/30/2021   Osteoporosis 08/30/2021   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   Genetic testing 11/15/2020   Family history of breast cancer 11/07/2020   Family history of pancreatic cancer 11/07/2020   Dysphagia 10/26/2020   Malignant neoplasm of overlapping sites of right breast in female, estrogen receptor positive ([place]) 06/21/2020   Hyperthyroidism 03/28/2016    REFERRING DIAG: C50.811,Z17.0 (ICD-10-CM) - Malignant neoplasm of overlapping sites of right breast in female, estrogen receptor positive ([place])  THERAPY DIAG:  Muscle weakness (generalized)  Other muscle spasm  Rationale for Evaluation and Treatment Rehabilitation  PERTINENT HISTORY: Hernia repair  2010, appendectomy, bowel resection, c-section, breast cancer, chronic UTI  PRECAUTIONS: None  SUBJECTIVE: I have been using more moisturizing.  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 I just had that appointment.  PAIN:  Are you having pain? No     OBJECTIVE: (objective measures completed at initial evaluation unless otherwise dated)  PATIENT SURVEYS:      PFIQ-7 POPIQ = 19 (eval); POPIQ = 10 (02/15/22)   COGNITION:            Overall cognitive status: Within functional limits for tasks  assessed                              MUSCLE LENGTH: Hamstrings: Right 80 deg; Left 70 deg     LUMBAR SPECIAL TESTS:  ASLR - harder on left but a little easier with compression   FUNCTIONAL TESTS:    SLS trendelenburg Rt side; unsteady bil GAIT:   Comments: WFL   POSTURE:  Rt hip elevated in standing   LUMBARAROM/PROM   A/PROM A/PROM  10/31/2021  Flexion 75%  Extension    Right lateral flexion    Left lateral flexion    Right rotation    Left rotation     (Blank rows = not tested)   LE ROM:   Passive ROM Right 10/31/2021 Left 10/31/2021  Hip flexion 80% 90%  Hip extension      Hip abduction      Hip adduction      Hip internal rotation 50% 75%  Hip external rotation 50% 75%  Knee flexion      Knee extension      Ankle dorsiflexion      Ankle plantarflexion      Ankle inversion      Ankle eversion       (Blank rows = not tested)   LE MMT:   MMT Right 10/31/2021 Left 10/31/2021  Hip flexion      Hip extension      Hip abduction      Hip adduction      Hip internal rotation      Hip external rotation      Knee flexion      Knee extension      Ankle dorsiflexion      Ankle plantarflexion      Ankle inversion      Ankle eversion        PELVIC MMT:   MMT   10/31/2021  Vaginal 2/5 (weak squeeze, definite lift)  Internal Anal Sphincter    External Anal Sphincter    Puborectalis    Diastasis Recti    (Blank rows = not tested)         PALPATION:   General  tension on the Rt flank and slight side bend in standing                 External Perineal Exam descending perineal body                             Internal Pelvic Floor decreased closure and TTP Lt side more than Rt; difficulty holding contraction and fatigues quickly with reps; stiff muscles   TONE: high   PROLAPSE: None observed - did not do bearing down   03/15/22 - observed bearing down with minimal anterior wall  prolapse; observed in standing and no prolapse noted  TODAY'S TREATMENT   Date: 03/27/22  Self care Prolapse management with stretches on pillows and LE propped up - educated on doing this 2-3x/day to see if it helps bladder release  Exercise: Pallof press red band 10x Rotation in half kneel  Date: 03/15/22 Self care:   Educated on prolapse and pt wants to check due to feeling a bulge anterior vaginal wall when applying moisturizer - see above assessment of prolapse Reviewed moisturizing and fascial release to vaginal canal nightly Exercise: Pallof press red band 10x   Date: 01/15/22 Self care:  educated on more hydration in the morning and throughout the day, then no drinking after dinner Educated on release to vaginal canal with vitamin e oil at night Manual: Patient confirms identification and approves physical therapist to perform internal soft tissue work  Development worker, community - fascial release bil vaginal walls Thoracic paraspinals and rhomboids Rt; lumbar paraspinals bil Trigger Point Dry-Needling  Treatment instructions: Expect mild to moderate muscle soreness. S/S of pneumothorax if dry needled over a lung field, and to seek immediate medical attention should they occur. Patient verbalized understanding of these instructions and education.  Patient Consent Given: Yes Education handout provided: Yes Muscles treated: lumbar and thoracic multifidi Electrical stimulation performed: No Parameters: N/A Treatment response/outcome: increased soft tissue length palpated, twitch response in left lumbar        PATIENT EDUCATION:  Education details: sample given; Access Code: [email or web address] Person educated: Patient Education method: Explanation Education comprehension: verbalized understanding     HOME EXERCISE PROGRAM: Access Code: [email or web address] Added exercises for progression   ASSESSMENT:   CLINICAL IMPRESSION: Pt is still feeling more flared up since she did the moisturizing in supine.  Pt is still having a lot of nocturia 3-4/night.  Pt was  educated more on prolapse and stretching on pillows to improved bladder emptying during the day as this may be part of the issue for her nocturia.  Pt was given core exercises with review of recent exercise and adding to the difficulty level. Pt will benefit from skilled PT to progress strength and reduce nocturia.     OBJECTIVE IMPAIRMENTS decreased coordination, decreased endurance, decreased ROM, decreased strength, postural dysfunction, and pain.    ACTIVITY LIMITATIONS community activity and interpersonal relationships .    PERSONAL FACTORS 1-2 comorbidities: history of breast cancer, radiation, chemo, anti-estrogen medicine  are also affecting patient's functional outcome.      REHAB POTENTIAL: Excellent   CLINICAL DECISION MAKING: Evolving/moderate complexity   EVALUATION COMPLEXITY: Moderate     GOALS: Goals reviewed with patient? Yes   SHORT TERM GOALS: Target date: 11/28/2021   Ind with initial HEP Baseline: 11/27/21  Goal status: met 12/05/21  2.  Ind with moisturizer for vaginal canal and has found one that feels good for reducing chaffing. Baseline: 01/03/22  Goal status: met       LONG TERM GOALS: Target date: 04/11/22  updated 03/27/22    Pt will be independent with advanced HEP to maintain improvements made throughout therapy   Baseline:  Goal status: Ongoing   2.  Pt will report 75% reduction of pain due to improvements in posture, strength, and muscle length   Baseline: not having pain Goal status: Met   3.  Pt will have POPIQ score improved by least 5 points. Baseline: 10 - improved by 9 Goal status: MET   4.  Pt will have nocturia decreased to 1-2/night at most  Baseline: 3-4 every night; 2 on a very good night; occasionally leak a very small amount at night Goal status: IN PROGRESS   5.  Pt will not have pain with voiding due to improved skin integrity and muscle tone Baseline:  Goal status: met 6.  Pt will be able to do all functional  exercises without leakage Baseline: not leaking with exercises Goal status: IN PROGRESS        PLAN: PT FREQUENCY: 1x/week   PT DURATION: 12 weeks   PLANNED INTERVENTIONS: Therapeutic exercises, Therapeutic activity, Neuromuscular re-education, Balance training, Gait training, Patient/Family education, Joint mobilization, Dry Needling, Electrical stimulation, Spinal mobilization, Cryotherapy, Moist heat, Taping, Biofeedback, and Manual therapy   PLAN FOR NEXT SESSION: f/u on elevated LE and stretches throughout the day to increased voiding and bladder emptying during the day; progress core and pelvic floor as able.    Camillo Flaming Lokelani Lutes, PT 03/27/2022, 5:32 PM

## 2022-03-27 NOTE — Progress Notes (Signed)
Patient Care Team: Wenda Low, MD as PCP - General (Internal Medicine) [name], MD as Consulting Physician (General Surgery) Nicholas Lose, MD as Medical Oncologist (Hematology and Oncology) Kyung Rudd, MD as Consulting Physician (Radiation Oncology) Jacelyn Pi, MD as Referring Physician (Endocrinology) Lona Millard, MD as Referring Physician (Endocrinology) Arta Silence, MD as Consulting Physician (Gastroenterology) Raina Mina, RPH-CPP as Pharmacist (Hematology and Oncology)  DIAGNOSIS: No diagnosis found.  SUMMARY OF ONCOLOGIC HISTORY: Oncology History  Malignant neoplasm of overlapping sites of right breast in female, estrogen receptor positive ([place])  06/21/2020 Initial Diagnosis   Patient palpated a right breast mass x 1 wk. Mammogram showed multiple confluent masses in the right breast at the 7 o'clock position measuring 2.3cm and at the 9 o'clock position measuring 2.2cm with one right axillary lymph node with cortical thickening. Biopsy showed invasive mammary carcinoma at both positions and in the axilla, grade 2-3, HER-2 equivocal by IHC (2+), positive by FISH (ratio 3.34), ER+ >95%, PR+ 25%, Ki67 60%.   06/29/2020 Cancer Staging   Staging form: Breast, AJCC 8th Edition - Clinical stage from 06/29/2020: Stage IB (cT2, cN1(f), cM0, G3, ER+, PR+, HER2+) - Signed by Nicholas Lose, MD on 06/29/2020   07/13/2020 - 11/16/2020 Neo-Adjuvant Chemotherapy   Taxotere, Carbo, Herceptin, Perjeta x 6 followed by 1 cycle of herceptin/perjeta   10/28/2020 Breast MRI   Complete imaging response   11/14/2020 Genetic Testing   Negative hereditary cancer genetic testing: no pathogenic variants detected in [facility] BRCAPlus Panel and [facility] CancerNext-Expanded +RNAinsight Panel.  The report dates are Nov 14, 2020 and December 05, 2020, respectively.    The BRCAplus panel offered by [employer] and includes sequencing and deletion/duplication analysis for the following 8 genes:  ATM, BRCA1, BRCA2, CDH1, CHEK2, PALB2, PTEN, and TP53.  The CancerNext-Expanded gene panel offered by [facility] and includes sequencing, rearrangement, and RNA analysis for the following 77 genes: AIP, ALK, APC, ATM, AXIN2, BAP1, BARD1, BLM, BMPR1A, BRCA1, BRCA2, BRIP1, CDC73, CDH1, CDK4, CDKN1B, CDKN2A, CHEK2, CTNNA1, DICER1, FANCC, FH, FLCN, GALNT12, KIF1B, LZTR1, MAX, MEN1, MET, MLH1, MSH2, MSH3, MSH6, MUTYH, NBN, NF1, NF2, NTHL1, PALB2, PHOX2B, PMS2, POT1, PRKAR1A, PTCH1, PTEN, RAD51C, RAD51D, RB1, RECQL, RET, SDHA, SDHAF2, SDHB, SDHC, SDHD, SMAD4, SMARCA4, SMARCB1, SMARCE1, STK11, SUFU, TMEM127, TP53, TSC1, TSC2, VHL and XRCC2 (sequencing and deletion/duplication); EGFR, EGLN1, HOXB13, KIT, MITF, PDGFRA, POLD1, and POLE (sequencing only); EPCAM and GREM1 (deletion/duplication only).    11/28/2020 Surgery   Right mastectomy [name]): IDC, grade 3, spanning 4.0cm fibrotic area, clear margins, 1/4 right axillary lymph nodes positive for carcinoma, 0.5cm.   Left lumpectomy: no evidence of malignancy   11/28/2020 Cancer Staging   Staging form: Breast, AJCC 8th Edition - Pathologic stage from 11/28/2020: No Stage Recommended (ypT2, pN1a, cM0) - Signed by Gardenia Phlegm, NP on 07/26/2021 Stage prefix: Post-therapy   12/07/2020 - 05/23/2021 Chemotherapy   Patient is on Treatment Plan : BREAST ADO-Trastuzumab Emtansine (Kadcyla) q21d     01/04/2021 - 02/17/2021 Radiation Therapy   Site Technique Total Dose (Gy) Dose per Fx (Gy) Completed Fx Beam Energies  Chest Wall, Right: CW_Rt 3D 50.4/50.4 1.8 28/28 6X  Chest Wall, Right: CW_Rt_SCLV 3D 50.4/50.4 1.8 28/28 6X, 10X  Chest Wall, Right: CW_Rt_Bst Electron 10/10 2 5/5 6E     12/2020 -  Anti-estrogen oral therapy   Letrozole daily     CHIEF COMPLIANT: Follow-up breast cancer on Nertinib  INTERVAL HISTORY: Teresa Sheppard is a 61 y.o with the  above-mentioned. She presents to the clinic for a follow-up.    ALLERGIES:  is allergic to  shellfish-derived products, ciprofibrate, oxycodone hcl, shrimp extract allergy skin test, hydrocodone, and penicillins.  MEDICATIONS:  Current Outpatient Medications  Medication Sig Dispense Refill   acetaminophen (TYLENOL) 325 MG tablet Take 325 mg by mouth every 6 (six) hours as needed (headaches / pain).     Calcium-Vitamin D-Vitamin K ([employer] CALCIUM SOFT CHEWS) 650-12.5-40 MG-MCG-MCG CHEW Chew 1 tablet by mouth 2 (two) times daily.     cholecalciferol (VITAMIN D3) 25 MCG (1000 UNIT) tablet Take 2,000 Units by mouth daily.     Lactobacillus-Inulin (CULTURELLE ADULT ULT BALANCE PO) Take by mouth daily.     letrozole (FEMARA) 2.5 MG tablet TAKE 1 TABLET(2.5 MG) BY MOUTH DAILY 90 tablet 3   loratadine (CLARITIN) 10 MG tablet Take 10 mg by mouth daily.     mometasone (ELOCON) 0.1 % cream Apply 1 Application topically at bedtime.     Multiple Vitamins-Minerals (HAIR/SKIN/NAILS/BIOTIN PO) Take 1 capsule by mouth daily.     Neratinib Maleate (NERLYNX) 40 MG tablet Take 6 tablets (240 mg total) by mouth daily. Take with food. 168 tablet 10   ondansetron (ZOFRAN) 8 MG tablet Take 1 tablet (8 mg total) by mouth every 8 (eight) hours as needed for nausea or vomiting. 30 tablet 2   Polyethyl Glycol-Propyl Glycol (SYSTANE OP) Place 1 drop into both eyes daily as needed (dry eyes).     prochlorperazine (COMPAZINE) 10 MG tablet Take 1 tablet (10 mg total) by mouth every 6 (six) hours as needed for nausea or vomiting. 30 tablet 2   Thymol CRYS by Does not apply route. Apply 1-2 drops to nailbed 2 times a day     No current facility-administered medications for this visit.    PHYSICAL EXAMINATION: ECOG PERFORMANCE STATUS: {CHL ONC ECOG PS:[phone]}  There were no vitals filed for this visit. There were no vitals filed for this visit.  BREAST:*** No palpable masses or nodules in either right or left breasts. No palpable axillary supraclavicular or infraclavicular adenopathy no breast tenderness or  nipple discharge. (exam performed in the presence of a chaperone)  LABORATORY DATA:  I have reviewed the data as listed    Latest Ref Rng & Units 02/27/2022    1:52 PM 01/01/2022    1:18 PM 12/18/2021   10:43 AM  CMP  Glucose 70 - 99 mg/dL 95  81  125   BUN 6 - 20 mg/dL 19  17  13    Creatinine 0.44 - 1.00 mg/dL 0.95  0.98  0.75   Sodium 135 - 145 mmol/L 140  139  139   Potassium 3.5 - 5.1 mmol/L 4.1  4.0  4.0   Chloride 98 - 111 mmol/L 106  104  105   CO2 22 - 32 mmol/L 30  30  28    Calcium 8.9 - 10.3 mg/dL 9.6  9.9  9.5   Total Protein 6.5 - 8.1 g/dL 7.0  7.8  7.5   Total Bilirubin 0.3 - 1.2 mg/dL 0.4  0.6  0.7   Alkaline Phos 38 - 126 U/L 111  146  210   AST 15 - 41 U/L 39  44  88   ALT 0 - 44 U/L 29  36  106     Lab Results  Component Value Date   WBC 3.5 (L) 02/27/2022   HGB 11.7 (L) 02/27/2022   HCT 34.4 (L) 02/27/2022   MCV  93.7 02/27/2022   PLT 110 (L) 02/27/2022   NEUTROABS 2.0 02/27/2022    ASSESSMENT & PLAN:  No problem-specific Assessment & Plan notes found for this encounter.    No orders of the defined types were placed in this encounter.  The patient has a good understanding of the overall plan. she agrees with it. she will call with any problems that may develop before the next visit here. Total time spent: 30 mins including face to face time and time spent for planning, charting and co-ordination of care   Suzzette Righter, [place] 03/27/22    I Gardiner Coins am scribing for [name]  ***

## 2022-03-29 ENCOUNTER — Other Ambulatory Visit: Payer: Self-pay | Admitting: *Deleted

## 2022-03-29 ENCOUNTER — Inpatient Hospital Stay: Payer: BC Managed Care – PPO | Admitting: Hematology and Oncology

## 2022-03-29 ENCOUNTER — Inpatient Hospital Stay: Payer: BC Managed Care – PPO | Attending: Hematology and Oncology

## 2022-03-29 DIAGNOSIS — R399 Unspecified symptoms and signs involving the genitourinary system: Secondary | ICD-10-CM | POA: Diagnosis not present

## 2022-03-29 DIAGNOSIS — N390 Urinary tract infection, site not specified: Secondary | ICD-10-CM | POA: Diagnosis not present

## 2022-03-29 DIAGNOSIS — C773 Secondary and unspecified malignant neoplasm of axilla and upper limb lymph nodes: Secondary | ICD-10-CM | POA: Diagnosis not present

## 2022-03-29 DIAGNOSIS — Z79811 Long term (current) use of aromatase inhibitors: Secondary | ICD-10-CM | POA: Insufficient documentation

## 2022-03-29 DIAGNOSIS — Z170 Estrogen receptor positive status [ER+]: Secondary | ICD-10-CM

## 2022-03-29 DIAGNOSIS — R197 Diarrhea, unspecified: Secondary | ICD-10-CM | POA: Insufficient documentation

## 2022-03-29 DIAGNOSIS — C50811 Malignant neoplasm of overlapping sites of right female breast: Secondary | ICD-10-CM | POA: Insufficient documentation

## 2022-03-29 DIAGNOSIS — L03011 Cellulitis of right finger: Secondary | ICD-10-CM | POA: Diagnosis not present

## 2022-03-29 DIAGNOSIS — R5383 Other fatigue: Secondary | ICD-10-CM | POA: Diagnosis not present

## 2022-03-29 LAB — CBC WITH DIFFERENTIAL/PLATELET
Abs Immature Granulocytes: 0.02 10*3/uL (ref 0.00–0.07)
Basophils Absolute: 0 10*3/uL (ref 0.0–0.1)
Basophils Relative: 1 %
Eosinophils Absolute: 0.2 10*3/uL (ref 0.0–0.5)
Eosinophils Relative: 3 %
HCT: 34.2 % — ABNORMAL LOW (ref 36.0–46.0)
Hemoglobin: 11.7 g/dL — ABNORMAL LOW (ref 12.0–15.0)
Immature Granulocytes: 0 %
Lymphocytes Relative: 20 %
Lymphs Abs: 1 10*3/uL (ref 0.7–4.0)
MCH: 32.1 pg (ref 26.0–34.0)
MCHC: 34.2 g/dL (ref 30.0–36.0)
MCV: 93.7 fL (ref 80.0–100.0)
Monocytes Absolute: 0.5 10*3/uL (ref 0.1–1.0)
Monocytes Relative: 10 %
Neutro Abs: 3.2 10*3/uL (ref 1.7–7.7)
Neutrophils Relative %: 66 %
Platelets: 153 10*3/uL (ref 150–400)
RBC: 3.65 MIL/uL — ABNORMAL LOW (ref 3.87–5.11)
RDW: 12.4 % (ref 11.5–15.5)
WBC: 5 10*3/uL (ref 4.0–10.5)
nRBC: 0 % (ref 0.0–0.2)

## 2022-03-29 LAB — CMP (CANCER CENTER ONLY)
ALT: 26 U/L (ref 0–44)
AST: 37 U/L (ref 15–41)
Albumin: 4.2 g/dL (ref 3.5–5.0)
Alkaline Phosphatase: 111 U/L (ref 38–126)
Anion gap: 4 — ABNORMAL LOW (ref 5–15)
BUN: 16 mg/dL (ref 6–20)
CO2: 30 mmol/L (ref 22–32)
Calcium: 9.2 mg/dL (ref 8.9–10.3)
Chloride: 102 mmol/L (ref 98–111)
Creatinine: 0.93 mg/dL (ref 0.44–1.00)
GFR, Estimated: >60 mL/min (ref >60)
Glucose, Bld: 94 mg/dL (ref 70–99)
Potassium: 4.1 mmol/L (ref 3.5–5.1)
Sodium: 136 mmol/L (ref 135–145)
Total Bilirubin: 0.4 mg/dL (ref 0.3–1.2)
Total Protein: 7.4 g/dL (ref 6.5–8.1)

## 2022-03-29 LAB — MAGNESIUM: Magnesium: 2 mg/dL (ref 1.7–2.4)

## 2022-03-29 NOTE — Progress Notes (Signed)
Per MD request RN placed referral to Social Work for emotional support and to offer pt cancer groups to become apart of.

## 2022-03-29 NOTE — Assessment & Plan Note (Addendum)
06/21/2020:Patient palpated a right breast mass x 1 wk. Mammogram showed multiple confluent masses in the right breast at the 7 o'clock position measuring 2.3cm and at the 9 o'clock position measuring 2.2cm with one right axillary lymph node with cortical thickening. Biopsy showed invasive mammary carcinoma at both positions and in the axilla, grade 2-3, HER-2 equivocal by IHC (2+), positive by FISH (ratio 3.34), ER+ >95%, PR+ 25%, Ki67 60%.  Treatment plan: 1. Neoadjuvant chemotherapy with [place] Perjeta 6 cyclescompleted 5/4/2022followed by HerceptinPerjeta versus Kadcylamaintenance for 1 yearcompleted 05/23/2021 2.Right mastectomy [name]): IDC, grade 3, spanning 4.0cm fibrotic area, clear margins, 1/4 right axillary lymph nodes positive for carcinoma, 0.5cm. Left lumpectomy: no evidence of malignancy 3. Followed by adjuvant radiation therapycompleted 02/17/2021 4.Followed by adjuvant antiestrogen therapystarted 12/29/2020 5.Followed by neratinib(to be started July 2023) ---------------------------------------------------------------------------------------------------------------------------------- Current Treatment:letrozole  COVID infection: August 2023: Treated with antiviral medication. (Molnupiravir) Letrozole toxicities: Tolerating it extremely well.  Surveillance: [facility]: Benign Bone density: November 2022: T score -2.4:  2.Breast MRI: 11/17/2021: Benign breast density category C  Current Treatment: Neratinibheld for COVID-19 infection: Restarted neratinib on 01/30/2022. Neratinib toxicities: 1.  Diarrhea is manageable when she takes 5 tablets a day.  I instructed her to go down to 5 tablets a day.  This will be her maintenance dose. 2. mild dizziness: Probably orthostatic in nature. 3.  Mild fatigue  She has an appointment in November to see John.  After that I will see her in January and subsequently she can be seen every couple  of months.

## 2022-03-30 ENCOUNTER — Telehealth: Payer: Self-pay | Admitting: Hematology and Oncology

## 2022-03-30 DIAGNOSIS — Z23 Encounter for immunization: Secondary | ICD-10-CM | POA: Diagnosis not present

## 2022-03-30 NOTE — Telephone Encounter (Signed)
Scheduled appointment per 10/5 los. Patient is aware.

## 2022-04-03 ENCOUNTER — Encounter: Payer: Self-pay | Admitting: Hematology and Oncology

## 2022-04-04 ENCOUNTER — Other Ambulatory Visit (HOSPITAL_COMMUNITY): Payer: Self-pay

## 2022-04-06 ENCOUNTER — Other Ambulatory Visit (HOSPITAL_COMMUNITY): Payer: Self-pay

## 2022-04-06 NOTE — Progress Notes (Signed)
Upon referral, Lysle Morales, counseling intern, called patient to schedule counseling session.   The patient has scheduled their first session for Monday, October 16th at 3:30.   Lysle Morales,  Counseling Intern [phone]

## 2022-04-09 ENCOUNTER — Ambulatory Visit: Payer: BC Managed Care – PPO | Admitting: Physical Therapy

## 2022-04-09 ENCOUNTER — Telehealth: Payer: Self-pay | Admitting: Physical Therapy

## 2022-04-09 NOTE — Therapy (Deleted)
OUTPATIENT PHYSICAL THERAPY TREATMENT NOTE   Patient Name: Teresa Sheppard MRN: [id number] DOB:Jul 16, 1960, 61 y.o., female Today's Date: 04/09/2022  PCP: Wenda Low, MD REFERRING PROVIDER:  Nicholas Lose, MD    END OF SESSION:           Past Medical History:  Diagnosis Date   Anxiety    Breast cancer in female [facility])    Right   Family history of breast cancer 11/07/2020   Family history of pancreatic cancer 11/07/2020   History of kidney stones 2014-2015   Hyperthyroidism    No longer an issue   PONV (postoperative nausea and vomiting)    Thyromegaly    Past Surgical History:  Procedure Laterality Date   ABDOMINAL WALL DEFECT REPAIR     following hernia repair in 2010   [place]     2008  while pregnant with last child. Scar tissue from appendectomy wrapped around bowel and was snipped to free bowel.    CESAREAN SECTION     DILATION AND CURETTAGE OF UTERUS     2000 and 2014   HERNIA REPAIR     MASTECTOMY W/ SENTINEL NODE BIOPSY Right 11/28/2020   Procedure: RIGHT MASTECTOMY WITH RIGHT AXILLARY SENTINEL LYMPH NODE BIOPSY;  Surgeon: [name], MD;  Location: [place];  Service: General;  Laterality: Right;   PORT-A-CATH REMOVAL Left 01/30/2021   Procedure: PORT REMOVAL;  Surgeon: [name], MD;  Location: [place];  Service: General;  Laterality: Left;  LOCAL   PORTACATH PLACEMENT Left 07/12/2020   Procedure: INSERTION PORT-A-CATH WITH ULTRASOUND GUIDANCE;  Surgeon: [name], MD;  Location: [place];  Service: General;  Laterality: Left;   RADIOACTIVE SEED GUIDED AXILLARY SENTINEL LYMPH NODE Right 11/28/2020   Procedure: RADIOACTIVE SEED GUIDED RIGHT AXILLARY AXILLARY SENTINEL LYMPH NODE EXCISION;  Surgeon: [name], MD;  Location: [place];  Service: General;  Laterality: Right;   RADIOACTIVE SEED GUIDED EXCISIONAL BREAST BIOPSY Left 11/28/2020   Procedure: RADIOACTIVE SEED GUIDED LEFT EXCISIONAL BREAST BIOPSY;   Surgeon: [name], MD;  Location: [place];  Service: General;  Laterality: Left;   ROOT CANAL     2019   TONSILLECTOMY  1969   URETERAL EXPLORATION     dilataion for congenitally small ureter   Patient Active Problem List   Diagnosis Date Noted   Chemotherapy-induced peripheral neuropathy ([place]) 10/24/2021   Vitamin D deficiency 08/30/2021   Osteoporosis 08/30/2021   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   Genetic testing 11/15/2020   Family history of breast cancer 11/07/2020   Family history of pancreatic cancer 11/07/2020   Dysphagia 10/26/2020   Malignant neoplasm of overlapping sites of right breast in female, estrogen receptor positive ([place]) 06/21/2020   Hyperthyroidism 03/28/2016    REFERRING DIAG: C50.811,Z17.0 (ICD-10-CM) - Malignant neoplasm of overlapping sites of right breast in female, estrogen receptor positive ([place])  THERAPY DIAG:  No diagnosis found.  Rationale for Evaluation and Treatment Rehabilitation  PERTINENT HISTORY: Hernia repair 2010, appendectomy, bowel resection, c-section, breast cancer, chronic UTI  PRECAUTIONS: None  SUBJECTIVE: I have been using more moisturizing.  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 I just had that appointment.  PAIN:  Are you having pain? No     OBJECTIVE: (objective measures completed at initial evaluation unless otherwise dated)  PATIENT SURVEYS:      PFIQ-7 POPIQ = 19 (eval); POPIQ = 10 (02/15/22)  COGNITION:            Overall cognitive status: Within functional limits for tasks assessed                              MUSCLE LENGTH: Hamstrings: Right 80 deg; Left 70 deg     LUMBAR SPECIAL TESTS:  ASLR - harder on left but a little easier with compression   FUNCTIONAL TESTS:    SLS trendelenburg Rt side; unsteady bil GAIT:   Comments: WFL   POSTURE:  Rt hip elevated in standing   LUMBARAROM/PROM   A/PROM A/PROM   10/31/2021  Flexion 75%  Extension    Right lateral flexion    Left lateral flexion    Right rotation    Left rotation     (Blank rows = not tested)   LE ROM:   Passive ROM Right 10/31/2021 Left 10/31/2021  Hip flexion 80% 90%  Hip extension      Hip abduction      Hip adduction      Hip internal rotation 50% 75%  Hip external rotation 50% 75%  Knee flexion      Knee extension      Ankle dorsiflexion      Ankle plantarflexion      Ankle inversion      Ankle eversion       (Blank rows = not tested)   LE MMT:   MMT Right 10/31/2021 Left 10/31/2021  Hip flexion      Hip extension      Hip abduction      Hip adduction      Hip internal rotation      Hip external rotation      Knee flexion      Knee extension      Ankle dorsiflexion      Ankle plantarflexion      Ankle inversion      Ankle eversion        PELVIC MMT:   MMT   10/31/2021  Vaginal 2/5 (weak squeeze, definite lift)  Internal Anal Sphincter    External Anal Sphincter    Puborectalis    Diastasis Recti    (Blank rows = not tested)         PALPATION:   General  tension on the Rt flank and slight side bend in standing                 External Perineal Exam descending perineal body                             Internal Pelvic Floor decreased closure and TTP Lt side more than Rt; difficulty holding contraction and fatigues quickly with reps; stiff muscles   TONE: high   PROLAPSE: None observed - did not do bearing down   03/15/22 - observed bearing down with minimal anterior wall prolapse; observed in standing and no prolapse noted  TODAY'S TREATMENT  Date: 03/27/22  Self care Prolapse management with stretches on pillows and LE propped up - educated on doing this 2-3x/day to see if it helps bladder release  Exercise: Pallof press red band 10x Rotation in half kneel  Date: 03/15/22 Self care:   Educated on prolapse and pt wants to check due to feeling a bulge anterior vaginal wall when  applying moisturizer - see above assessment of prolapse Reviewed moisturizing  and fascial release to vaginal canal nightly Exercise: Pallof press red band 10x   Date: 01/15/22 Self care:  educated on more hydration in the morning and throughout the day, then no drinking after dinner Educated on release to vaginal canal with vitamin e oil at night Manual: Patient confirms identification and approves physical therapist to perform internal soft tissue work  Development worker, community - fascial release bil vaginal walls Thoracic paraspinals and rhomboids Rt; lumbar paraspinals bil Trigger Point Dry-Needling  Treatment instructions: Expect mild to moderate muscle soreness. S/S of pneumothorax if dry needled over a lung field, and to seek immediate medical attention should they occur. Patient verbalized understanding of these instructions and education.  Patient Consent Given: Yes Education handout provided: Yes Muscles treated: lumbar and thoracic multifidi Electrical stimulation performed: No Parameters: N/A Treatment response/outcome: increased soft tissue length palpated, twitch response in left lumbar        PATIENT EDUCATION:  Education details: sample given; Access Code: [email or web address] Person educated: Patient Education method: Explanation Education comprehension: verbalized understanding     HOME EXERCISE PROGRAM: Access Code: [email or web address] Added exercises for progression   ASSESSMENT:   CLINICAL IMPRESSION: Pt is still feeling more flared up since she did the moisturizing in supine.  Pt is still having a lot of nocturia 3-4/night.  Pt was educated more on prolapse and stretching on pillows to improved bladder emptying during the day as this may be part of the issue for her nocturia.  Pt was given core exercises with review of recent exercise and adding to the difficulty level. Pt will benefit from skilled PT to progress strength and reduce nocturia.     OBJECTIVE IMPAIRMENTS decreased  coordination, decreased endurance, decreased ROM, decreased strength, postural dysfunction, and pain.    ACTIVITY LIMITATIONS community activity and interpersonal relationships .    PERSONAL FACTORS 1-2 comorbidities: history of breast cancer, radiation, chemo, anti-estrogen medicine  are also affecting patient's functional outcome.      REHAB POTENTIAL: Excellent   CLINICAL DECISION MAKING: Evolving/moderate complexity   EVALUATION COMPLEXITY: Moderate     GOALS: Goals reviewed with patient? Yes   SHORT TERM GOALS: Target date: 11/28/2021   Ind with initial HEP Baseline: 11/27/21  Goal status: met 12/05/21  2.  Ind with moisturizer for vaginal canal and has found one that feels good for reducing chaffing. Baseline: 01/03/22  Goal status: met       LONG TERM GOALS: Target date: 04/11/22  updated 03/27/22    Pt will be independent with advanced HEP to maintain improvements made throughout therapy   Baseline:  Goal status: Ongoing   2.  Pt will report 75% reduction of pain due to improvements in posture, strength, and muscle length   Baseline: not having pain Goal status: Met   3.  Pt will have POPIQ score improved by least 5 points. Baseline: 10 - improved by 9 Goal status: MET   4.  Pt will have nocturia decreased to 1-2/night at most   Baseline: 3-4 every night; 2 on a very good night; occasionally leak a very small amount at night Goal status: IN PROGRESS   5.  Pt will not have pain with voiding due to improved skin integrity and muscle tone Baseline:  Goal status: met 6.  Pt will be able to do all functional exercises without leakage Baseline: not leaking with exercises Goal status: IN PROGRESS        PLAN: PT FREQUENCY: 1x/week   PT DURATION: 12 weeks  PLANNED INTERVENTIONS: Therapeutic exercises, Therapeutic activity, Neuromuscular re-education, Balance training, Gait training, Patient/Family education, Joint mobilization, Dry Needling,  Electrical stimulation, Spinal mobilization, Cryotherapy, Moist heat, Taping, Biofeedback, and Manual therapy   PLAN FOR NEXT SESSION: f/u on elevated LE and stretches throughout the day to increased voiding and bladder emptying during the day; progress core and pelvic floor as able.    Camillo Flaming Sophea Rackham, PT 04/09/2022, 1:11 PM

## 2022-04-09 NOTE — Progress Notes (Signed)
Teresa Sheppard, counseling intern, met with the patient for their first counseling session.   The patient and counselor spent the first half of the session getting to know one another. The patient reported they experience anxiety due to their persistent symptoms from medication they are taking. The patient reported they feel like there has been a shift at home because they are not able to do as many chores due to exhaustion from their medicine. The patient also reported they are currently focusing more on self care - exercising and eating a strict diet.   The patient and counselor spoke about expectations in counseling. The counselor shared that through the counselor's counseling lens, the counselor and patient work as collaborators to write goals and solutions.   The patient scheduled their next counseling session for Monday, October 16th at 3:30pm.   Teresa Sheppard Counseling Intern  [phone]

## 2022-04-09 NOTE — Telephone Encounter (Signed)
Pt was called due to no show.  Pt reported she did not realize there was an appointment scheduled today and she was able to reschedule for later this week.  Gustavus Bryant, PT 04/09/22 2:24 PM

## 2022-04-10 ENCOUNTER — Other Ambulatory Visit (HOSPITAL_COMMUNITY): Payer: Self-pay

## 2022-04-12 ENCOUNTER — Ambulatory Visit: Payer: BC Managed Care – PPO | Admitting: Physical Therapy

## 2022-04-12 DIAGNOSIS — M62838 Other muscle spasm: Secondary | ICD-10-CM | POA: Diagnosis not present

## 2022-04-12 DIAGNOSIS — M6281 Muscle weakness (generalized): Secondary | ICD-10-CM

## 2022-04-12 NOTE — Therapy (Signed)
OUTPATIENT PHYSICAL THERAPY TREATMENT NOTE   Patient Name: Teresa Sheppard MRN: [id number] DOB:1960/11/08, 61 y.o., female Today's Date: 04/12/2022  PCP: Wenda Low, MD REFERRING PROVIDER:  Nicholas Lose, MD    END OF SESSION:   PT End of Session - 04/12/22 1622     Visit Number 9    Date for PT Re-Evaluation 07/05/22    Authorization Type [employer]    PT Start Time 1620    PT Stop Time 1700    PT Time Calculation (min) 40 min    Activity Tolerance Patient tolerated treatment well    Behavior During Therapy WFL for tasks assessed/performed                    Past Medical History:  Diagnosis Date   Anxiety    Breast cancer in female [facility])    Right   Family history of breast cancer 11/07/2020   Family history of pancreatic cancer 11/07/2020   History of kidney stones 2014-2015   Hyperthyroidism    No longer an issue   PONV (postoperative nausea and vomiting)    Thyromegaly    Past Surgical History:  Procedure Laterality Date   ABDOMINAL WALL DEFECT REPAIR     following hernia repair in 2010   [place]     2008  while pregnant with last child. Scar tissue from appendectomy wrapped around bowel and was snipped to free bowel.    CESAREAN SECTION     DILATION AND CURETTAGE OF UTERUS     2000 and 2014   HERNIA REPAIR     MASTECTOMY W/ SENTINEL NODE BIOPSY Right 11/28/2020   Procedure: RIGHT MASTECTOMY WITH RIGHT AXILLARY SENTINEL LYMPH NODE BIOPSY;  Surgeon: [name], MD;  Location: [place];  Service: General;  Laterality: Right;   PORT-A-CATH REMOVAL Left 01/30/2021   Procedure: PORT REMOVAL;  Surgeon: [name], MD;  Location: [place];  Service: General;  Laterality: Left;  LOCAL   PORTACATH PLACEMENT Left 07/12/2020   Procedure: INSERTION PORT-A-CATH WITH ULTRASOUND GUIDANCE;  Surgeon: [name], MD;  Location: [place];  Service: General;  Laterality: Left;   RADIOACTIVE SEED GUIDED AXILLARY SENTINEL LYMPH NODE  Right 11/28/2020   Procedure: RADIOACTIVE SEED GUIDED RIGHT AXILLARY AXILLARY SENTINEL LYMPH NODE EXCISION;  Surgeon: [name], MD;  Location: [employer];  Service: General;  Laterality: Right;   RADIOACTIVE SEED GUIDED EXCISIONAL BREAST BIOPSY Left 11/28/2020   Procedure: RADIOACTIVE SEED GUIDED LEFT EXCISIONAL BREAST BIOPSY;  Surgeon: [name], MD;  Location: [place];  Service: General;  Laterality: Left;   ROOT CANAL     2019   TONSILLECTOMY  1969   URETERAL EXPLORATION     dilataion for congenitally small ureter   Patient Active Problem List   Diagnosis Date Noted   Chemotherapy-induced peripheral neuropathy ([place]) 10/24/2021   Vitamin D deficiency 08/30/2021   Osteoporosis 08/30/2021   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   Genetic testing 11/15/2020   Family history of breast cancer 11/07/2020   Family history of pancreatic cancer 11/07/2020   Dysphagia 10/26/2020   Malignant neoplasm of overlapping sites of right breast in female, estrogen receptor positive ([place]) 06/21/2020   Hyperthyroidism 03/28/2016    REFERRING DIAG: C50.811,Z17.0 (ICD-10-CM) - Malignant neoplasm of overlapping sites of right breast in female, estrogen receptor positive ([place])  THERAPY DIAG:  Muscle weakness (generalized)  Other muscle spasm  Rationale for Evaluation and Treatment Rehabilitation  PERTINENT HISTORY: Hernia repair 2010,  appendectomy, bowel resection, c-section, breast cancer, chronic UTI  PRECAUTIONS: None  SUBJECTIVE:  04/12/22 I had that diagnosis which has flared things up, I am on a vaginal steroid cream now for that, I am on a more stabilized with that.  The stinging and burning is better with the cream.  The nocturia is much worse.  I am trying to figure out what is causing that and how to manage those symptoms  03/27/22: I have been using more moisturizing.  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 I just  had that appointment.  PAIN:  Are you having pain? No     OBJECTIVE: (objective measures completed at initial evaluation unless otherwise dated)  PATIENT SURVEYS:      PFIQ-7 POPIQ = 19 (eval); POPIQ = 10 (02/15/22)   COGNITION:            Overall cognitive status: Within functional limits for tasks assessed                              MUSCLE LENGTH: Hamstrings: Right 80 deg; Left 70 deg     LUMBAR SPECIAL TESTS:  ASLR - harder on left but a little easier with compression   FUNCTIONAL TESTS:    SLS trendelenburg Rt side; unsteady bil GAIT:   Comments: WFL   POSTURE:  Rt hip elevated in standing   LUMBARAROM/PROM   A/PROM A/PROM  10/31/2021  Flexion 75%  Extension    Right lateral flexion    Left lateral flexion    Right rotation    Left rotation     (Blank rows = not tested)   LE ROM:   Passive ROM Right 10/31/2021 Left 10/31/2021  Hip flexion 80% 90%  Hip extension      Hip abduction      Hip adduction      Hip internal rotation 50% 75%  Hip external rotation 50% 75%  Knee flexion      Knee extension      Ankle dorsiflexion      Ankle plantarflexion      Ankle inversion      Ankle eversion       (Blank rows = not tested)   LE MMT:   MMT Right 10/31/2021 Left 10/31/2021  Hip flexion      Hip extension      Hip abduction      Hip adduction      Hip internal rotation      Hip external rotation      Knee flexion      Knee extension      Ankle dorsiflexion      Ankle plantarflexion      Ankle inversion      Ankle eversion        PELVIC MMT:   MMT   10/31/2021  Vaginal 2/5 (weak squeeze, definite lift)  Internal Anal Sphincter    External Anal Sphincter    Puborectalis    Diastasis Recti    (Blank rows = not tested)         PALPATION:   General  tension on the Rt flank and slight side bend in standing                 External Perineal Exam descending perineal body  Internal Pelvic Floor decreased closure  and TTP Lt side more than Rt; difficulty holding contraction and fatigues quickly with reps; stiff muscles   TONE: high   PROLAPSE: None observed - did not do bearing down   03/15/22 - observed bearing down with minimal anterior wall prolapse; observed in standing and no prolapse noted  TODAY'S TREATMENT  Date: 04/12/22  Self care Reviewed moisturizing and techniques to avoid irritation - try nitril gloves and don't do for more than 30 strokes on each side  Manual Patient confirms identification and approves physical therapist to perform internal soft tissue work   compressor urethra mobs and urethra fascial release performed bilaterally Re-assess strength and coordination - MMT is 3/5 and hold for 3-4 seconds  Date: 03/27/22  Self care Prolapse management with stretches on pillows and LE propped up - educated on doing this 2-3x/day to see if it helps bladder release  Exercise: Pallof press red band 10x Rotation in half kneel  Date: 03/15/22 Self care:   Educated on prolapse and pt wants to check due to feeling a bulge anterior vaginal wall when applying moisturizer - see above assessment of prolapse Reviewed moisturizing and fascial release to vaginal canal nightly Exercise: Pallof press red band 10x         PATIENT EDUCATION:  Education details: sample given; Access Code: [email or web address] Person educated: Patient Education method: Explanation Education comprehension: verbalized understanding     HOME EXERCISE PROGRAM: Access Code: [email or web address] Added exercises for progression   ASSESSMENT:   CLINICAL IMPRESSION: Pt was re-assessed today due to having flare up of her symptoms of nocturia and still has some vulvar irritation.  Today's session was used to re-check pelvic floor strength which is about the same.  She also has very tight fascial restrictions around the urethra compressor muscle which release with fascial release and soft tissue mobs.  Pt had less tenderness after  today's treatment.  Pt will benefit from skilled PT to continue to address pelvic floor muscle flexibility and coordination for maximum improvement in sleep and quality of life   OBJECTIVE IMPAIRMENTS decreased coordination, decreased endurance, decreased ROM, decreased strength, postural dysfunction, and pain.    ACTIVITY LIMITATIONS community activity and interpersonal relationships .    PERSONAL FACTORS 1-2 comorbidities: history of breast cancer, radiation, chemo, anti-estrogen medicine  are also affecting patient's functional outcome.      REHAB POTENTIAL: Excellent   CLINICAL DECISION MAKING: Evolving/moderate complexity   EVALUATION COMPLEXITY: Moderate     GOALS: Goals reviewed with patient? Yes   SHORT TERM GOALS: Target date: 11/28/2021   Ind with initial HEP Baseline: 11/27/21  Goal status: met 12/05/21  2.  Ind with moisturizer for vaginal canal and has found one that feels good for reducing chaffing. Baseline: 01/03/22  Goal status: met       LONG TERM GOALS: Target date: 07/05/2022  updated 04/12/22    Pt will be independent with advanced HEP to maintain improvements made throughout therapy   Baseline:  Goal status: Ongoing   2.  Pt will report 75% reduction of pain due to improvements in posture, strength, and muscle length   Baseline: not having pain Goal status: Met   3.  Pt will have POPIQ score improved by least 5 points. Baseline: 10 - improved by 9 Goal status: MET   4.  Pt will have nocturia decreased to 1-2/night at most   Baseline: It went back up to 6  Goal status: IN PROGRESS   5.  Pt will not have pain with voiding due to improved skin integrity and muscle tone Baseline:  Goal status: met 6.  Pt will be able to do all functional exercises without leakage Baseline: not leaking with exercises Goal status: IN PROGRESS        PLAN: PT FREQUENCY: 1x/week   PT DURATION: 12 weeks   PLANNED INTERVENTIONS: Therapeutic exercises,  Therapeutic activity, Neuromuscular re-education, Balance training, Gait training, Patient/Family education, Joint mobilization, Dry Needling, Electrical stimulation, Spinal mobilization, Cryotherapy, Moist heat, Taping, Biofeedback, and Manual therapy   PLAN FOR NEXT SESSION: f/u on soft tissue work and do biofeedback to work on Financial risk analyst, PT 04/13/2022, 11:53 AM

## 2022-04-16 ENCOUNTER — Telehealth: Payer: Self-pay

## 2022-04-16 ENCOUNTER — Other Ambulatory Visit (HOSPITAL_COMMUNITY): Payer: Self-pay

## 2022-04-16 NOTE — Telephone Encounter (Signed)
Lysle Morales, counseling intern, called patient to see if the patient could move their appointment from 1pm to 1:15pm on Thursday October 26th.   The counselor left a voicemail and will try calling again this week.   Lysle Morales,  Counseling Intern  [phone]

## 2022-04-17 ENCOUNTER — Other Ambulatory Visit (HOSPITAL_COMMUNITY): Payer: Self-pay

## 2022-04-18 DIAGNOSIS — R109 Unspecified abdominal pain: Secondary | ICD-10-CM | POA: Diagnosis not present

## 2022-04-19 ENCOUNTER — Other Ambulatory Visit (HOSPITAL_COMMUNITY): Payer: Self-pay

## 2022-04-19 ENCOUNTER — Inpatient Hospital Stay: Payer: BC Managed Care – PPO | Admitting: Pharmacist

## 2022-04-19 DIAGNOSIS — C50811 Malignant neoplasm of overlapping sites of right female breast: Secondary | ICD-10-CM

## 2022-04-19 NOTE — Progress Notes (Signed)
[place]       Telephone: [phone]?Fax: [phone]   Oncology Clinical Pharmacist Practitioner Progress Note  Teresa Sheppard is a 61 y.o. female with a diagnosis of breast cancer currently on extended adjuvant neratinib under the care of Dr. Nicholas Lose. I connected with [name] today by telephone and verified that I was speaking with the correct person using two patient identifiers.   Other persons participating in the visit and their role in the encounter: n/a   Patient's location: home  Provider's location: clinic   [name] was contacted today by clinical pharmacy to discuss her recent increase in loose stools.  She reports having multiple loose stools this morning and taking more loperamide liquid than she normally needs to take.  She reports taking approximately 45 mL today and is now doing much better.  She uses loperamide 1 mg/7.5 mL liquid and we discussed today that she could take up to 16 mg in a 24-hour period.  During our discussion, she was getting ready to eat lunch and felt that her morning loose stool episodes may have been due to what she ate last night at [facility] which was a dish with a lot of vegetables.  She last saw [name] on 03/29/22 and remains on neratinib 5 tablets daily with food.  She asked if she could go down to 4 tablets daily if needed for a period of time if her loose stools would continue, and we felt this to be reasonable.  She will next see clinical pharmacy on 04/26/22 with labs.  We went over warning signs of dehydration and she states that she is not having any of these signs currently.  She continues to do a great job with proper fluid intake, exercising regularly, and eating a healthy diet.  At Dr. Geralyn Flash last visit, he stated that she could probably see every other month with labs after her November visit with clinical pharmacy. We discussed that any side effects should be called into the 24/7 triage line  which is [phone] to prevent any delays in response.   Clinical pharmacy will continue to [place] and Dr. Nicholas Lose as needed.  Raina Mina, RPH-CPP,  04/19/2022  2:12 PM   **Disclaimer: This note was dictated with voice recognition software. Similar sounding words can inadvertently be transcribed and this note may contain transcription errors which may not have been corrected upon publication of note.**

## 2022-04-20 ENCOUNTER — Other Ambulatory Visit (HOSPITAL_COMMUNITY): Payer: Self-pay

## 2022-04-20 DIAGNOSIS — E049 Nontoxic goiter, unspecified: Secondary | ICD-10-CM | POA: Diagnosis not present

## 2022-04-20 DIAGNOSIS — E042 Nontoxic multinodular goiter: Secondary | ICD-10-CM | POA: Diagnosis not present

## 2022-04-23 ENCOUNTER — Telehealth: Payer: Self-pay

## 2022-04-23 ENCOUNTER — Encounter: Payer: Self-pay | Admitting: Physical Therapy

## 2022-04-23 ENCOUNTER — Ambulatory Visit: Payer: BC Managed Care – PPO | Admitting: Physical Therapy

## 2022-04-23 DIAGNOSIS — M62838 Other muscle spasm: Secondary | ICD-10-CM

## 2022-04-23 DIAGNOSIS — M6281 Muscle weakness (generalized): Secondary | ICD-10-CM | POA: Diagnosis not present

## 2022-04-23 NOTE — Therapy (Signed)
OUTPATIENT PHYSICAL THERAPY TREATMENT NOTE   Patient Name: Teresa Sheppard MRN: [id number] DOB:December 01, 1960, 61 y.o., female Today's Date: 04/12/2022  PCP: Wenda Low, MD REFERRING PROVIDER:  Nicholas Lose, MD    END OF SESSION:   PT End of Session - 04/23/22 1149     Visit Number 10    Date for PT Re-Evaluation 07/05/22    Authorization Type BCBS    PT Start Time 1145    PT Stop Time 1233    PT Time Calculation (min) 48 min    Activity Tolerance Patient tolerated treatment well    Behavior During Therapy WFL for tasks assessed/performed                     Past Medical History:  Diagnosis Date   Anxiety    Breast cancer in female [facility])    Right   Family history of breast cancer 11/07/2020   Family history of pancreatic cancer 11/07/2020   History of kidney stones 2014-2015   Hyperthyroidism    No longer an issue   PONV (postoperative nausea and vomiting)    Thyromegaly    Past Surgical History:  Procedure Laterality Date   ABDOMINAL WALL DEFECT REPAIR     following hernia repair in 2010   [place]     2008  while pregnant with last child. Scar tissue from appendectomy wrapped around bowel and was snipped to free bowel.    CESAREAN SECTION     DILATION AND CURETTAGE OF UTERUS     2000 and 2014   HERNIA REPAIR     MASTECTOMY W/ SENTINEL NODE BIOPSY Right 11/28/2020   Procedure: RIGHT MASTECTOMY WITH RIGHT AXILLARY SENTINEL LYMPH NODE BIOPSY;  Surgeon: [name], MD;  Location: [place];  Service: General;  Laterality: Right;   PORT-A-CATH REMOVAL Left 01/30/2021   Procedure: PORT REMOVAL;  Surgeon: [name], MD;  Location: [place];  Service: General;  Laterality: Left;  LOCAL   PORTACATH PLACEMENT Left 07/12/2020   Procedure: INSERTION PORT-A-CATH WITH ULTRASOUND GUIDANCE;  Surgeon: [name], MD;  Location: [place];  Service: General;  Laterality: Left;   RADIOACTIVE SEED GUIDED AXILLARY SENTINEL LYMPH NODE  Right 11/28/2020   Procedure: RADIOACTIVE SEED GUIDED RIGHT AXILLARY AXILLARY SENTINEL LYMPH NODE EXCISION;  Surgeon: [name], MD;  Location: [place];  Service: General;  Laterality: Right;   RADIOACTIVE SEED GUIDED EXCISIONAL BREAST BIOPSY Left 11/28/2020   Procedure: RADIOACTIVE SEED GUIDED LEFT EXCISIONAL BREAST BIOPSY;  Surgeon: [name], MD;  Location: [place];  Service: General;  Laterality: Left;   ROOT CANAL     2019   TONSILLECTOMY  1969   URETERAL EXPLORATION     dilataion for congenitally small ureter   Patient Active Problem List   Diagnosis Date Noted   Chemotherapy-induced peripheral neuropathy ([place]) 10/24/2021   Vitamin D deficiency 08/30/2021   Osteoporosis 08/30/2021   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   Genetic testing 11/15/2020   Family history of breast cancer 11/07/2020   Family history of pancreatic cancer 11/07/2020   Dysphagia 10/26/2020   Malignant neoplasm of overlapping sites of right breast in female, estrogen receptor positive ([place]) 06/21/2020   Hyperthyroidism 03/28/2016    REFERRING DIAG: C50.811,Z17.0 (ICD-10-CM) - Malignant neoplasm of overlapping sites of right breast in female, estrogen receptor positive ([place])  THERAPY DIAG:  Muscle weakness (generalized)  Other muscle spasm  Rationale for Evaluation and Treatment Rehabilitation  PERTINENT HISTORY: Hernia repair  2010, appendectomy, bowel resection, c-section, breast cancer, chronic UTI  PRECAUTIONS: None  SUBJECTIVE:  04/23/22: She believes the massage has been helping a little. She had one day where she only got up twice. She took a break from the exercise due to going on vacation. She had some bowel issues the past couple of days. On average she has to get up about four times in the night.  03/27/22: I have been using more moisturizing.  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 I just had that  appointment.  PAIN:  Are you having pain? No but has some discomfort in the pelvic floor region OBJECTIVE: (objective measures completed at initial evaluation unless otherwise dated)  PATIENT SURVEYS:    PFIQ-7 POPIQ = 19 (eval); POPIQ = 10 (02/15/22)   COGNITION:            Overall cognitive status: Within functional limits for tasks assessed                              MUSCLE LENGTH: Hamstrings: Right 80 deg; Left 70 deg     LUMBAR SPECIAL TESTS:  ASLR - harder on left but a little easier with compression   FUNCTIONAL TESTS:    SLS trendelenburg Rt side; unsteady bil GAIT:   Comments: WFL   POSTURE:  Rt hip elevated in standing   LUMBARAROM/PROM   A/PROM A/PROM  10/31/2021  Flexion 75%  Extension    Right lateral flexion    Left lateral flexion    Right rotation    Left rotation     (Blank rows = not tested)   LE ROM:   Passive ROM Right 10/31/2021 Left 10/31/2021  Hip flexion 80% 90%  Hip extension      Hip abduction      Hip adduction      Hip internal rotation 50% 75%  Hip external rotation 50% 75%  Knee flexion      Knee extension      Ankle dorsiflexion      Ankle plantarflexion      Ankle inversion      Ankle eversion       (Blank rows = not tested)   LE MMT:   MMT Right 10/31/2021 Left 10/31/2021  Hip flexion      Hip extension      Hip abduction      Hip adduction      Hip internal rotation      Hip external rotation      Knee flexion      Knee extension      Ankle dorsiflexion      Ankle plantarflexion      Ankle inversion      Ankle eversion        PELVIC MMT:   MMT   10/31/2021  Vaginal 2/5 (weak squeeze, definite lift)  Internal Anal Sphincter    External Anal Sphincter    Puborectalis    Diastasis Recti    (Blank rows = not tested)         PALPATION:   General  tension on the Rt flank and slight side bend in standing                 External Perineal Exam descending perineal body  Internal  Pelvic Floor decreased closure and TTP Lt side more than Rt; difficulty holding contraction and fatigues quickly with reps; stiff muscles   TONE: high   PROLAPSE: None observed - did not do bearing down   03/15/22 - observed bearing down with minimal anterior wall prolapse; observed in standing and no prolapse noted  TODAY'S TREATMENT  Date: 04/23/22  NeuroReED Biofeedback:started at 150 mV Diaphragmatic breathing  Diaphragmatic breathing knee hugs in supine* most relaxed lowest 3-5 mV Diaphragmatic breathing  single leg hugs in supine Diaphragmatic breathing  with pelvic floor relaxtion  Diaphragmatic breathing butterfly  Diaphragmatic breathing child pose Diaphragmatic breathing  quad ped - *relaxed after during child pose 5-58m Diaphragmatic breathing  stand bent over table   Date: 04/12/22  Self care Reviewed moisturizing and techniques to avoid irritation - try nitril gloves and don't do for more than 30 strokes on each side  Manual Patient confirms identification and approves physical therapist to perform internal soft tissue work   compressor urethra mobs and urethra fascial release performed bilaterally Re-assess strength and coordination - MMT is 3/5 and hold for 3-4 seconds  Date: 03/27/22  Self care Prolapse management with stretches on pillows and LE propped up - educated on doing this 2-3x/day to see if it helps bladder release  Exercise: Pallof press red band 10x Rotation in half kneel  Date: 03/15/22 Self care:   Educated on prolapse and pt wants to check due to feeling a bulge anterior vaginal wall when applying moisturizer - see above assessment of prolapse Reviewed moisturizing and fascial release to vaginal canal nightly Exercise: Pallof press red band 10x         PATIENT EDUCATION:  Education details: sample given; Access Code: [email or web address] Person educated: Patient Education method: Explanation Education comprehension: verbalized  understanding     HOME EXERCISE PROGRAM: Access Code: [email or web address]: [email or web address] Date: 04/23/2022 Prepared by: JJari FavreExercises - Child's Pose Stretch - 1 x daily - 7 x weekly - 1 sets - 3 reps - 30-60 hold - Diaphragmatic Breathing in Supported Child's Pose with Pelvic Floor Relaxation - 1 x daily - 7 x weekly - 3 sets - 10 reps - Standing 'L' Stretch at Counter - 1 x daily - 7 x weekly - 3 sets - 10 reps - Rock - 1 x daily - 7 x weekly - 3 sets - 10 reps - Quadruped Transversus Abdominis Bracing - 1 x daily - 7 x weekly - 2 sets - 10 reps - 5 sec hold - Supine Double Knee to Chest - 1 x daily - 7 x weekly - 1 sets - 3 reps - 30 sec hold  ASSESSMENT:   CLINICAL IMPRESSION: Pt responded well to therapy today  Today's session focused on finding stretches that demonstrate the most pelvic floor relaxation with biofeedback. Patient demonstrated the most relaxation with the knee hugs in supine. She also was about to relax her pelvic floor after holding Childs pose and then returning to QPED. Patient started around 150 mV and was able to get down to 3-5 mV with certain stretches. She averaged around 50-853mthroughout the session. Patient was given an HEP with stretches that demonstrated relaxation of the pelvic floor and to continue doing the perineal massage. Pt will benefit from skilled PT to continue to address pelvic floor muscle flexibility and coordination for maximum improvement in sleep and quality of life   OBJECTIVE IMPAIRMENTS decreased coordination, decreased endurance, decreased ROM, decreased strength, postural dysfunction, and  pain.    ACTIVITY LIMITATIONS community activity and interpersonal relationships .    PERSONAL FACTORS 1-2 comorbidities: history of breast cancer, radiation, chemo, anti-estrogen medicine  are also affecting patient's functional outcome.      REHAB POTENTIAL: Excellent   CLINICAL DECISION MAKING: Evolving/moderate complexity    EVALUATION COMPLEXITY: Moderate     GOALS: Goals reviewed with patient? Yes   SHORT TERM GOALS: Target date: 11/28/2021   Ind with initial HEP Baseline: 11/27/21  Goal status: met 12/05/21  2.  Ind with moisturizer for vaginal canal and has found one that feels good for reducing chaffing. Baseline: 01/03/22  Goal status: met       LONG TERM GOALS: Target date: 07/05/2022  updated 04/23/2022     Pt will be independent with advanced HEP to maintain improvements made throughout therapy   Baseline:  Goal status: Ongoing   2.  Pt will report 75% reduction of pain due to improvements in posture, strength, and muscle length   Baseline: not having pain Goal status: Met   3.  Pt will have POPIQ score improved by least 5 points. Baseline: 10 - improved by 9 Goal status: MET   4.  Pt will have nocturia decreased to 1-2/night at most   Baseline: is about 4 times on average Goal status: IN PROGRESS   5.  Pt will not have pain with voiding due to improved skin integrity and muscle tone Baseline:  Goal status: met 6.  Pt will be able to do all functional exercises without leakage Baseline: not leaking with exercises Goal status: IN PROGRESS        PLAN: PT FREQUENCY: 1x/week   PT DURATION: 12 weeks   PLANNED INTERVENTIONS: Therapeutic exercises, Therapeutic activity, Neuromuscular re-education, Balance training, Gait training, Patient/Family education, Joint mobilization, Dry Needling, Electrical stimulation, Spinal mobilization, Cryotherapy, Moist heat, Taping, Biofeedback, and Manual therapy   PLAN FOR NEXT SESSION: pelvic floor isolation exercises, internal Soft tissue, stretches, start to incorporate core strengthening.    Rosario Jacks, Student-PT 04/23/2022  12:48 PM   I agree with the following treatment note after reviewing documentation. This session was performed under the supervision of a licensed clinician. Gustavus Bryant, PT 04/23/22 12:50 PM

## 2022-04-23 NOTE — Telephone Encounter (Signed)
Teresa Sheppard, counseling intern, called patient after patient missed their counseling session last week.   Patient did not answer phone. Counselor left a message and will try calling again later this week.   Jaclyn Shaggy Lawayne Hartig  [phone] [email or web address]

## 2022-04-25 DIAGNOSIS — F4322 Adjustment disorder with anxiety: Secondary | ICD-10-CM | POA: Diagnosis not present

## 2022-04-26 ENCOUNTER — Inpatient Hospital Stay: Payer: BC Managed Care – PPO

## 2022-04-26 ENCOUNTER — Inpatient Hospital Stay: Payer: BC Managed Care – PPO | Attending: Hematology and Oncology | Admitting: Pharmacist

## 2022-04-26 ENCOUNTER — Ambulatory Visit
Admission: RE | Admit: 2022-04-26 | Discharge: 2022-04-26 | Disposition: A | Discharge status: Home / Self Care | Payer: BC Managed Care – PPO | Source: Ambulatory Visit | Attending: Surgery | Admitting: Surgery

## 2022-04-26 ENCOUNTER — Other Ambulatory Visit: Payer: Self-pay

## 2022-04-26 VITALS — BP 116/66 | HR 59 | Temp 98.1°F | Resp 18 | Ht 70.0 in | Wt 150.6 lb

## 2022-04-26 DIAGNOSIS — Z170 Estrogen receptor positive status [ER+]: Secondary | ICD-10-CM

## 2022-04-26 DIAGNOSIS — E049 Nontoxic goiter, unspecified: Secondary | ICD-10-CM

## 2022-04-26 DIAGNOSIS — C50811 Malignant neoplasm of overlapping sites of right female breast: Secondary | ICD-10-CM | POA: Diagnosis not present

## 2022-04-26 DIAGNOSIS — E042 Nontoxic multinodular goiter: Secondary | ICD-10-CM

## 2022-04-26 DIAGNOSIS — E041 Nontoxic single thyroid nodule: Secondary | ICD-10-CM | POA: Diagnosis not present

## 2022-04-26 LAB — CBC WITH DIFFERENTIAL/PLATELET
Abs Immature Granulocytes: 0.01 10*3/uL (ref 0.00–0.07)
Basophils Absolute: 0 10*3/uL (ref 0.0–0.1)
Basophils Relative: 1 %
Eosinophils Absolute: 0.2 10*3/uL (ref 0.0–0.5)
Eosinophils Relative: 4 %
HCT: 37.6 % (ref 36.0–46.0)
Hemoglobin: 12.6 g/dL (ref 12.0–15.0)
Immature Granulocytes: 0 %
Lymphocytes Relative: 21 %
Lymphs Abs: 1.1 10*3/uL (ref 0.7–4.0)
MCH: 32 pg (ref 26.0–34.0)
MCHC: 33.5 g/dL (ref 30.0–36.0)
MCV: 95.4 fL (ref 80.0–100.0)
Monocytes Absolute: 0.4 10*3/uL (ref 0.1–1.0)
Monocytes Relative: 9 %
Neutro Abs: 3.4 10*3/uL (ref 1.7–7.7)
Neutrophils Relative %: 65 %
Platelets: 166 10*3/uL (ref 150–400)
RBC: 3.94 MIL/uL (ref 3.87–5.11)
RDW: 12.4 % (ref 11.5–15.5)
WBC: 5.2 10*3/uL (ref 4.0–10.5)
nRBC: 0 % (ref 0.0–0.2)

## 2022-04-26 LAB — CMP (CANCER CENTER ONLY)
ALT: 28 U/L (ref 0–44)
AST: 37 U/L (ref 15–41)
Albumin: 4.2 g/dL (ref 3.5–5.0)
Alkaline Phosphatase: 118 U/L (ref 38–126)
Anion gap: 4 — ABNORMAL LOW (ref 5–15)
BUN: 16 mg/dL (ref 6–20)
CO2: 32 mmol/L (ref 22–32)
Calcium: 9.9 mg/dL (ref 8.9–10.3)
Chloride: 105 mmol/L (ref 98–111)
Creatinine: 0.89 mg/dL (ref 0.44–1.00)
GFR, Estimated: >60 mL/min (ref >60)
Glucose, Bld: 75 mg/dL (ref 70–99)
Potassium: 4.4 mmol/L (ref 3.5–5.1)
Sodium: 141 mmol/L (ref 135–145)
Total Bilirubin: 0.5 mg/dL (ref 0.3–1.2)
Total Protein: 7.7 g/dL (ref 6.5–8.1)

## 2022-04-26 LAB — MAGNESIUM: Magnesium: 2.3 mg/dL (ref 1.7–2.4)

## 2022-04-26 NOTE — Progress Notes (Signed)
[place]       Telephone: [phone]?Fax: [phone]   Oncology Clinical Pharmacist Practitioner Progress Note  Teresa Sheppard was contacted via in person visit to discuss her chemotherapy regimen for neratinib which they receive under the care of Dr. Nicholas Lose.    Current treatment regimen and start date Neratinib (12/28/21) Letrozole (12/29/20)   Interval History She continues on neratinib 5 tablets (200 mg) by mouth daily on days 1 to 28 of a 28-day cycle. This is being given in combination with letrozole. Therapy is planned to continue until 1 year in the extended adjuvant setting.  Teresa Sheppard was seen today by clinical pharmacy as a follow-up to her neratinib management.  She last saw [name] on 03/29/22 and had a telephone encounter with clinical pharmacy on 04/19/22 and was last seen on 02/27/22. She is recovering well from her bout of diarrhea last week and is back up to 5 tablets of neratinib, slowly increasing back up last week.  Response to Therapy Teresa Sheppard is doing well.  She is not reporting any side effects besides the occasional loose stools which are managed by loperamide.  She continues to exercise regularly and maintain a healthy diet.  She did state that she will be traveling to [place] over Christmas to celebrate her birthday which is on [birth year] and had asked about potentially stopping neratinib during that time if she develops an upset stomach from the rich food.  We felt this to be reasonable and also suggested that she could consider reducing her dose of neratinib to 3 tablets daily with food while there.  She said that she would consider that.  She is also on 2 topical steroids per her gynecologist but she was not certain of the names.  She states that she will not be on these for a long duration and we told her that for the most part, these agents should have minimal systemic absorption and not interfere with neratinib.  She had also asked about  obtaining the COVID booster which she will likely do prior to her [place] trip.  She also continues to see Dr. Harlow Asa for her thyroid and will see him again sometime this month per her report.  She did state that she may at the start of a ingrown toenail and we recommended that a podiatrist would probably be the best position to evaluate.  She verbalized understanding.  He has received her flu vaccine this year.  At Dr. Geralyn Flash last visit, he stated that she could be seen every 2 months after the clinical pharmacy visit in November.  He will see her in early January and clinical pharmacy will then see her in early March.  She knows that she can contact Dr. Geralyn Flash clinic sooner with any questions or concerns.. Labs, vitals, treatment parameters, and manufacturer guidelines assessing toxicity were reviewed with Teresa Sheppard today. Based on these values, patient is in agreement to continue neratinib therapy at this time.  Allergies Allergies  Allergen Reactions   Shellfish-Derived Products Anaphylaxis    Per patient whenever she consumes a lot gives tightness of throat   Ciprofibrate Other (See Comments)    Joint pain    Oxycodone Hcl    Shrimp Extract Allergy Skin Test    Hydrocodone Anxiety and Nausea Only   Penicillins Rash    Vitals    04/26/2022    3:21 PM 03/29/2022    3:01 PM 02/27/2022    2:07 PM  Oncology Vitals  Height 178 cm 178 cm 178 cm  Weight 68.312 kg 67.405 kg 66.951 kg  Weight (lbs) 150 lbs 10 oz 148 lbs 10 oz 147 lbs 10 oz  BMI 21.61 kg/m2   21.61 kg/m2 21.32 kg/m2   21.32 kg/m2 21.18 kg/m2   21.18 kg/m2  Temp 98.1 F (36.7 C) 97.7 F (36.5 C) 97.7 F (36.5 C)  Pulse Rate 59 53 50  BP 116/66 117/61 118/70  Resp '18 18 16  '$ SpO2 100 % 100 % 100 %  BSA (m2) 1.84 m2   1.84 m2 1.82 m2   1.82 m2 1.82 m2   1.82 m2    Laboratory Data    Latest Ref Rng & Units 04/26/2022    2:36 PM 03/29/2022    2:54 PM 02/27/2022    1:52 PM  CBC EXTENDED  WBC 4.0 - 10.5  K/uL 5.2  5.0  3.5   RBC 3.87 - 5.11 MIL/uL 3.94  3.65  3.67   Hemoglobin 12.0 - 15.0 g/dL 12.6  11.7  11.7   HCT 36.0 - 46.0 % 37.6  34.2  34.4   Platelets 150 - 400 K/uL 166  153  110   NEUT# 1.7 - 7.7 K/uL 3.4  3.2  2.0   Lymph# 0.7 - 4.0 K/uL 1.1  1.0  1.0        Latest Ref Rng & Units 04/26/2022    2:36 PM 03/29/2022    2:54 PM 02/27/2022    1:52 PM  CMP  Glucose 70 - 99 mg/dL 75  94  95   BUN 6 - 20 mg/dL '16  16  19   '$ Creatinine 0.44 - 1.00 mg/dL 0.89  0.93  0.95   Sodium 135 - 145 mmol/L 141  136  140   Potassium 3.5 - 5.1 mmol/L 4.4  4.1  4.1   Chloride 98 - 111 mmol/L 105  102  106   CO2 22 - 32 mmol/L 32  30  30   Calcium 8.9 - 10.3 mg/dL 9.9  9.2  9.6   Total Protein 6.5 - 8.1 g/dL 7.7  7.4  7.0   Total Bilirubin 0.3 - 1.2 mg/dL 0.5  0.4  0.4   Alkaline Phos 38 - 126 U/L 118  111  111   AST 15 - 41 U/L 37  37  39   ALT 0 - 44 U/L '28  26  29     '$ Lab Results  Component Value Date   MG 2.3 04/26/2022   MG 2.0 03/29/2022   MG 2.0 02/27/2022   No results found for: "CA2729"   Adverse Effects Assessment Diarrhea: Controlled with loperamide  Adherence Assessment Teresa Sheppard reports missing 0 doses over the past 1 weeks.   Reason for missed dose: N/A.  As above, Teresa Sheppard increased her dose slowly back up to 5 tablets last week. Patient was re-educated on importance of adherence.   Access Assessment Teresa Sheppard is currently receiving her neratinib through [facility] concerns: None  Medication Reconciliation The patient's medication list was reviewed today with the patient?  Yes New medications or herbal supplements have recently been started?  Yes, Teresa Sheppard states she started a short course of topical steroids from her gynecologist.  She did not know the name of these medications during her visit.  We discussed that for the most part, these agents should have minimal systemic absorption and would likely not interfere with  neratinib. Any medications have been discontinued?  No The medication list was updated and reconciled based on the patient's most recent medication list in the electronic medical record (EMR) including herbal products and OTC medications.   Medications Current Outpatient Medications  Medication Sig Dispense Refill   acetaminophen (TYLENOL) 325 MG tablet Take 325 mg by mouth every 6 (six) hours as needed (headaches / pain).     Calcium-Vitamin D-Vitamin K ([employer] CALCIUM SOFT CHEWS) 650-12.5-40 MG-MCG-MCG CHEW Chew 1 tablet by mouth 2 (two) times daily.     cholecalciferol (VITAMIN D3) 25 MCG (1000 UNIT) tablet Take 2,000 Units by mouth daily.     Lactobacillus-Inulin (CULTURELLE ADULT ULT BALANCE PO) Take by mouth daily.     letrozole (FEMARA) 2.5 MG tablet TAKE 1 TABLET(2.5 MG) BY MOUTH DAILY 90 tablet 3   loratadine (CLARITIN) 10 MG tablet Take 10 mg by mouth daily.     mometasone (ELOCON) 0.1 % cream Apply 1 Application topically at bedtime.     Multiple Vitamins-Minerals (HAIR/SKIN/NAILS/BIOTIN PO) Take 1 capsule by mouth daily.     Neratinib Maleate (NERLYNX) 40 MG tablet Take 6 tablets (240 mg total) by mouth daily. Take with food. 168 tablet 10   ondansetron (ZOFRAN) 8 MG tablet Take 1 tablet (8 mg total) by mouth every 8 (eight) hours as needed for nausea or vomiting. 30 tablet 2   Polyethyl Glycol-Propyl Glycol (SYSTANE OP) Place 1 drop into both eyes daily as needed (dry eyes).     prochlorperazine (COMPAZINE) 10 MG tablet Take 1 tablet (10 mg total) by mouth every 6 (six) hours as needed for nausea or vomiting. 30 tablet 2   Thymol CRYS by Does not apply route. Apply 1-2 drops to nailbed 2 times a day     triamcinolone ointment (KENALOG) 0.1 % SMARTSIG:sparingly Topical Twice Daily     No current facility-administered medications for this visit.    Drug-Drug Interactions (DDIs) DDIs were evaluated?  Yes Significant DDIs?  Yes, we again discussed that her Biotene may have a calcium  component.  And if so, she needs to separate this by 3 hours with her neratinib. The patient was instructed to speak with their health care provider and/or the oral chemotherapy pharmacist before starting any new drug, including prescription or over the counter, natural / herbal products, or vitamins.  Supportive Care Continue loperamide as needed for loose stools  Dosing Assessment Hepatic adjustments needed?  No Renal adjustments needed?  No  Toxicity adjustments needed?  No The current dosing regimen is appropriate to continue at this time.  Follow-Up Plan Continue neratinib 5 tablets (200 mg) daily with food Continue letrozole 2.5 mg daily Continue loperamide as needed for loose stool She will see [name] with labs on 06/28/22 Will add labs, pharmacy clinic visit, in 4 months  Teresa Sheppard participated in the discussion, expressed understanding, and voiced agreement with the above plan. All questions were answered to her satisfaction. The patient was advised to contact the clinic at [phone] with any questions or concerns prior to her return visit.   I spent 30 minutes assessing and educating the patient.  Raina Mina, RPH-CPP, 04/26/2022  3:34 PM   **Disclaimer: This note was dictated with voice recognition software. Similar sounding words can inadvertently be transcribed and this note may contain transcription errors which may not have been corrected upon publication of note.**

## 2022-04-30 NOTE — Progress Notes (Signed)
Teresa Sheppard, counseling intern, met with patient for their scheduled counseling session.   The patient shared they felt "much less" anxious today than our last session. The patient shared they spoke with a CBT therapist this last week and the intake went well.   The patient shared about their worry for their eldest son. The patient shared their eldest son has a history of depression and is currently not communicative with the family. The patient's son lives a plane ride away and is in school working on his PhD.   The counselor shared with the patient two techniques to use for anxiety - the 5,4, 3, 2, 1, technique and muscle relaxation.   The patient scheduled their next counseling session for Monday, November 20th at 1:45pm.   Teresa Sheppard,  Counseling Intern  [phone] [email or web address]

## 2022-04-30 NOTE — Progress Notes (Signed)
No new or worrisome findings on follow-up ultrasound exam.  tmg  Teresa Sheppard, [facility] [place] practice Office: [phone]

## 2022-05-01 DIAGNOSIS — Z1231 Encounter for screening mammogram for malignant neoplasm of breast: Secondary | ICD-10-CM | POA: Diagnosis not present

## 2022-05-02 ENCOUNTER — Telehealth: Payer: Self-pay | Admitting: Pharmacist

## 2022-05-02 ENCOUNTER — Encounter: Payer: Self-pay | Admitting: Hematology and Oncology

## 2022-05-02 ENCOUNTER — Encounter: Payer: Self-pay | Admitting: Physical Therapy

## 2022-05-02 ENCOUNTER — Ambulatory Visit: Payer: BC Managed Care – PPO | Attending: Hematology and Oncology | Admitting: Physical Therapy

## 2022-05-02 DIAGNOSIS — M6281 Muscle weakness (generalized): Secondary | ICD-10-CM | POA: Diagnosis not present

## 2022-05-02 DIAGNOSIS — M62838 Other muscle spasm: Secondary | ICD-10-CM | POA: Diagnosis not present

## 2022-05-02 DIAGNOSIS — C50811 Malignant neoplasm of overlapping sites of right female breast: Secondary | ICD-10-CM | POA: Insufficient documentation

## 2022-05-02 DIAGNOSIS — Z170 Estrogen receptor positive status [ER+]: Secondary | ICD-10-CM | POA: Diagnosis not present

## 2022-05-02 NOTE — Therapy (Signed)
OUTPATIENT PHYSICAL THERAPY TREATMENT NOTE   Patient Name: Teresa Sheppard MRN: [id number] DOB:January 03, 1961, 61 y.o., female Today's Date: 04/12/2022  PCP: Wenda Low, MD REFERRING PROVIDER:  Nicholas Lose, MD    END OF SESSION:   PT End of Session - 05/02/22 1411     Visit Number 11    Date for PT Re-Evaluation 07/05/22    Authorization Type [employer]    PT Start Time 1404    PT Stop Time 9030    PT Time Calculation (min) 38 min    Activity Tolerance Patient tolerated treatment well    Behavior During Therapy WFL for tasks assessed/performed                      Past Medical History:  Diagnosis Date   Anxiety    Breast cancer in female [facility])    Right   Family history of breast cancer 11/07/2020   Family history of pancreatic cancer 11/07/2020   History of kidney stones 2014-2015   Hyperthyroidism    No longer an issue   PONV (postoperative nausea and vomiting)    Thyromegaly    Past Surgical History:  Procedure Laterality Date   ABDOMINAL WALL DEFECT REPAIR     following hernia repair in 2010   [place]     2008  while pregnant with last child. Scar tissue from appendectomy wrapped around bowel and was snipped to free bowel.    CESAREAN SECTION     DILATION AND CURETTAGE OF UTERUS     2000 and 2014   HERNIA REPAIR     MASTECTOMY W/ SENTINEL NODE BIOPSY Right 11/28/2020   Procedure: RIGHT MASTECTOMY WITH RIGHT AXILLARY SENTINEL LYMPH NODE BIOPSY;  Surgeon: [name], MD;  Location: [place];  Service: General;  Laterality: Right;   PORT-A-CATH REMOVAL Left 01/30/2021   Procedure: PORT REMOVAL;  Surgeon: [name], MD;  Location: [place];  Service: General;  Laterality: Left;  LOCAL   PORTACATH PLACEMENT Left 07/12/2020   Procedure: INSERTION PORT-A-CATH WITH ULTRASOUND GUIDANCE;  Surgeon: [name], MD;  Location: [place];  Service: General;  Laterality: Left;   RADIOACTIVE SEED GUIDED AXILLARY SENTINEL LYMPH  NODE Right 11/28/2020   Procedure: RADIOACTIVE SEED GUIDED RIGHT AXILLARY AXILLARY SENTINEL LYMPH NODE EXCISION;  Surgeon: [name], MD;  Location: [place];  Service: General;  Laterality: Right;   RADIOACTIVE SEED GUIDED EXCISIONAL BREAST BIOPSY Left 11/28/2020   Procedure: RADIOACTIVE SEED GUIDED LEFT EXCISIONAL BREAST BIOPSY;  Surgeon: [name], MD;  Location: [place];  Service: General;  Laterality: Left;   ROOT CANAL     2019   TONSILLECTOMY  1969   URETERAL EXPLORATION     dilataion for congenitally small ureter   Patient Active Problem List   Diagnosis Date Noted   Chemotherapy-induced peripheral neuropathy ([place]) 10/24/2021   Vitamin D deficiency 08/30/2021   Osteoporosis 08/30/2021   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   Genetic testing 11/15/2020   Family history of breast cancer 11/07/2020   Family history of pancreatic cancer 11/07/2020   Dysphagia 10/26/2020   Malignant neoplasm of overlapping sites of right breast in female, estrogen receptor positive ([place]) 06/21/2020   Hyperthyroidism 03/28/2016    REFERRING DIAG: C50.811,Z17.0 (ICD-10-CM) - Malignant neoplasm of overlapping sites of right breast in female, estrogen receptor positive ([place])  THERAPY DIAG:  No diagnosis found.  Rationale for Evaluation and Treatment Rehabilitation  PERTINENT HISTORY: Hernia repair 2010, appendectomy, bowel  resection, c-section, breast cancer, chronic UTI  PRECAUTIONS: None  SUBJECTIVE:  05/02/22: I have been using steroid cream and that may be helping and the exercises are helping learn to relax better.  I don't know for sure at this point but I only had to get up 3x last night and over the last week and half has been getting a little better  03/27/22: I have been using more moisturizing.  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 I just had that appointment.  PAIN:  Are you having pain? No but has some  discomfort in the pelvic floor region OBJECTIVE: (objective measures completed at initial evaluation unless otherwise dated)  PATIENT SURVEYS:    PFIQ-7 POPIQ = 19 (eval); POPIQ = 10 (02/15/22)   COGNITION:            Overall cognitive status: Within functional limits for tasks assessed                              MUSCLE LENGTH: Hamstrings: Right 80 deg; Left 70 deg     LUMBAR SPECIAL TESTS:  ASLR - harder on left but a little easier with compression   FUNCTIONAL TESTS:    SLS trendelenburg Rt side; unsteady bil GAIT:   Comments: WFL   POSTURE:  Rt hip elevated in standing   LUMBARAROM/PROM   A/PROM A/PROM  10/31/2021  Flexion 75%  Extension    Right lateral flexion    Left lateral flexion    Right rotation    Left rotation     (Blank rows = not tested)   LE ROM:   Passive ROM Right 10/31/2021 Left 10/31/2021  Hip flexion 80% 90%  Hip extension      Hip abduction      Hip adduction      Hip internal rotation 50% 75%  Hip external rotation 50% 75%  Knee flexion      Knee extension      Ankle dorsiflexion      Ankle plantarflexion      Ankle inversion      Ankle eversion       (Blank rows = not tested)   LE MMT:   MMT Right 10/31/2021 Left 10/31/2021  Hip flexion      Hip extension      Hip abduction      Hip adduction      Hip internal rotation      Hip external rotation      Knee flexion      Knee extension      Ankle dorsiflexion      Ankle plantarflexion      Ankle inversion      Ankle eversion        PELVIC MMT:   MMT   10/31/2021  Vaginal 2/5 (weak squeeze, definite lift)  Internal Anal Sphincter    External Anal Sphincter    Puborectalis    Diastasis Recti    (Blank rows = not tested)         PALPATION:   General  tension on the Rt flank and slight side bend in standing                 External Perineal Exam descending perineal body  Internal Pelvic Floor decreased closure and TTP Lt side more than Rt;  difficulty holding contraction and fatigues quickly with reps; stiff muscles   TONE: high   PROLAPSE: None observed - did not do bearing down   03/15/22 - observed bearing down with minimal anterior wall prolapse; observed in standing and no prolapse noted  TODAY'S TREATMENT  Date: 05/02/22   NeuroReED Half kneel and standing with pallof press - educated on posture during the exercises Manual Patient confirms identification and approves physical therapist to perform internal soft tissue work  - vaginal canal stretch at level of bladder  Date: 04/23/22  NeuroReED Biofeedback:started at 150 mV Diaphragmatic breathing  Diaphragmatic breathing knee hugs in supine* most relaxed lowest 3-5 mV Diaphragmatic breathing  single leg hugs in supine Diaphragmatic breathing  with pelvic floor relaxtion  Diaphragmatic breathing butterfly  Diaphragmatic breathing child pose Diaphragmatic breathing  quad ped - *relaxed after during child pose 5-25m Diaphragmatic breathing  stand bent over table   Date: 04/12/22  Self care Reviewed moisturizing and techniques to avoid irritation - try nitril gloves and don't do for more than 30 strokes on each side  Manual Patient confirms identification and approves physical therapist to perform internal soft tissue work   compressor urethra mobs and urethra fascial release performed bilaterally Re-assess strength and coordination - MMT is 3/5 and hold for 3-4 seconds          PATIENT EDUCATION:  Education details: sample given; Access Code: [email or web address] Person educated: Patient Education method: Explanation Education comprehension: verbalized understanding     HOME EXERCISE PROGRAM: Access Code: [email or web address]: [email or web address] Date: 04/23/2022 Prepared by: JJari FavreExercises - Child's Pose Stretch - 1 x daily - 7 x weekly - 1 sets - 3 reps - 30-60 hold - Diaphragmatic Breathing in Supported Child's Pose with Pelvic  Floor Relaxation - 1 x daily - 7 x weekly - 3 sets - 10 reps - Standing 'L' Stretch at Counter - 1 x daily - 7 x weekly - 3 sets - 10 reps - Rock - 1 x daily - 7 x weekly - 3 sets - 10 reps - Quadruped Transversus Abdominis Bracing - 1 x daily - 7 x weekly - 2 sets - 10 reps - 5 sec hold - Supine Double Knee to Chest - 1 x daily - 7 x weekly - 1 sets - 3 reps - 30 sec hold  ASSESSMENT:   CLINICAL IMPRESSION: Pt still having difficulty with main goal of nocturia reduction.  Pt reports it finally seems to be getting better and pt notices that when she is relaxing there is less irritation at the urethra.  Today's session focused on increased soft tissue length for improved function.  Pt also needed review of core exercises to continue to address core strength.  Pt will benefit from skilled PT to continue to address pelvic floor muscle flexibility and coordination for maximum improvement in sleep and quality of life   OBJECTIVE IMPAIRMENTS decreased coordination, decreased endurance, decreased ROM, decreased strength, postural dysfunction, and pain.    ACTIVITY LIMITATIONS community activity and interpersonal relationships .    PERSONAL FACTORS 1-2 comorbidities: history of breast cancer, radiation, chemo, anti-estrogen medicine  are also affecting patient's functional outcome.      REHAB POTENTIAL: Excellent   CLINICAL DECISION MAKING: Evolving/moderate complexity   EVALUATION COMPLEXITY: Moderate     GOALS: Goals reviewed with patient? Yes   SHORT TERM GOALS: Target date: 11/28/2021   Ind with initial  HEP Baseline: 11/27/21  Goal status: met 12/05/21  2.  Ind with moisturizer for vaginal canal and has found one that feels good for reducing chaffing. Baseline: 01/03/22  Goal status: met       LONG TERM GOALS: Target date: 07/05/2022  updated 04/23/2022     Pt will be independent with advanced HEP to maintain improvements made throughout therapy   Baseline:  Goal status:  Ongoing   2.  Pt will report 75% reduction of pain due to improvements in posture, strength, and muscle length   Baseline: not having pain Goal status: Met   3.  Pt will have POPIQ score improved by least 5 points. Baseline: 10 - improved by 9 Goal status: MET   4.  Pt will have nocturia decreased to 1-2/night at most   Baseline: is about 6x at the most but seems like it is getting better, last night was 3x Goal status: IN PROGRESS   5.  Pt will not have pain with voiding due to improved skin integrity and muscle tone Baseline:  Goal status: met 6.  Pt will be able to do all functional exercises without leakage Baseline: not leaking with exercises Goal status: IN PROGRESS        PLAN: PT FREQUENCY: 1x/week   PT DURATION: 12 weeks   PLANNED INTERVENTIONS: Therapeutic exercises, Therapeutic activity, Neuromuscular re-education, Balance training, Gait training, Patient/Family education, Joint mobilization, Dry Needling, Electrical stimulation, Spinal mobilization, Cryotherapy, Moist heat, Taping, Biofeedback, and Manual therapy   PLAN FOR NEXT SESSION: f/u on adding core exercises and response to continued soft tissue release vaginally at night  [employer], PT 05/02/22 2:16 PM

## 2022-05-02 NOTE — Telephone Encounter (Signed)
Scheduled appointment per 11/2 los. Patient is aware.

## 2022-05-03 ENCOUNTER — Other Ambulatory Visit (HOSPITAL_COMMUNITY): Payer: Self-pay

## 2022-05-04 ENCOUNTER — Other Ambulatory Visit (HOSPITAL_COMMUNITY): Payer: Self-pay

## 2022-05-07 DIAGNOSIS — N94810 Vulvar vestibulitis: Secondary | ICD-10-CM | POA: Diagnosis not present

## 2022-05-07 DIAGNOSIS — N390 Urinary tract infection, site not specified: Secondary | ICD-10-CM | POA: Diagnosis not present

## 2022-05-07 DIAGNOSIS — T50905A Adverse effect of unspecified drugs, medicaments and biological substances, initial encounter: Secondary | ICD-10-CM | POA: Diagnosis not present

## 2022-05-07 DIAGNOSIS — R399 Unspecified symptoms and signs involving the genitourinary system: Secondary | ICD-10-CM | POA: Diagnosis not present

## 2022-05-09 ENCOUNTER — Ambulatory Visit: Payer: BC Managed Care – PPO | Admitting: Physical Therapy

## 2022-05-09 ENCOUNTER — Other Ambulatory Visit (HOSPITAL_COMMUNITY): Payer: Self-pay

## 2022-05-09 NOTE — Therapy (Deleted)
OUTPATIENT PHYSICAL THERAPY TREATMENT NOTE   Patient Name: Teresa Sheppard MRN: [id number] DOB:Oct 24, 1960, 61 y.o., female Today's Date: 04/12/2022  PCP: Wenda Low, MD REFERRING PROVIDER:  Nicholas Lose, MD    END OF SESSION:              Past Medical History:  Diagnosis Date   Anxiety    Breast cancer in female [facility])    Right   Family history of breast cancer 11/07/2020   Family history of pancreatic cancer 11/07/2020   History of kidney stones 2014-2015   Hyperthyroidism    No longer an issue   PONV (postoperative nausea and vomiting)    Thyromegaly    Past Surgical History:  Procedure Laterality Date   ABDOMINAL WALL DEFECT REPAIR     following hernia repair in 2010   [place]     2008  while pregnant with last child. Scar tissue from appendectomy wrapped around bowel and was snipped to free bowel.    CESAREAN SECTION     DILATION AND CURETTAGE OF UTERUS     2000 and 2014   HERNIA REPAIR     MASTECTOMY W/ SENTINEL NODE BIOPSY Right 11/28/2020   Procedure: RIGHT MASTECTOMY WITH RIGHT AXILLARY SENTINEL LYMPH NODE BIOPSY;  Surgeon: [name], MD;  Location: [place];  Service: General;  Laterality: Right;   PORT-A-CATH REMOVAL Left 01/30/2021   Procedure: PORT REMOVAL;  Surgeon: [name], MD;  Location: [place];  Service: General;  Laterality: Left;  LOCAL   PORTACATH PLACEMENT Left 07/12/2020   Procedure: INSERTION PORT-A-CATH WITH ULTRASOUND GUIDANCE;  Surgeon: [name], MD;  Location: [place];  Service: General;  Laterality: Left;   RADIOACTIVE SEED GUIDED AXILLARY SENTINEL LYMPH NODE Right 11/28/2020   Procedure: RADIOACTIVE SEED GUIDED RIGHT AXILLARY AXILLARY SENTINEL LYMPH NODE EXCISION;  Surgeon: [name], MD;  Location: [place];  Service: General;  Laterality: Right;   RADIOACTIVE SEED GUIDED EXCISIONAL BREAST BIOPSY Left 11/28/2020   Procedure: RADIOACTIVE SEED GUIDED LEFT EXCISIONAL BREAST BIOPSY;   Surgeon: [name], MD;  Location: [place];  Service: General;  Laterality: Left;   ROOT CANAL     2019   TONSILLECTOMY  1969   URETERAL EXPLORATION     dilataion for congenitally small ureter   Patient Active Problem List   Diagnosis Date Noted   Chemotherapy-induced peripheral neuropathy ([place]) 10/24/2021   Vitamin D deficiency 08/30/2021   Osteoporosis 08/30/2021   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   Genetic testing 11/15/2020   Family history of breast cancer 11/07/2020   Family history of pancreatic cancer 11/07/2020   Dysphagia 10/26/2020   Malignant neoplasm of overlapping sites of right breast in female, estrogen receptor positive ([place]) 06/21/2020   Hyperthyroidism 03/28/2016    REFERRING DIAG: C50.811,Z17.0 (ICD-10-CM) - Malignant neoplasm of overlapping sites of right breast in female, estrogen receptor positive ([place])  THERAPY DIAG:  No diagnosis found.  Rationale for Evaluation and Treatment Rehabilitation  PERTINENT HISTORY: Hernia repair 2010, appendectomy, bowel resection, c-section, breast cancer, chronic UTI  PRECAUTIONS: None  SUBJECTIVE:  05/02/22: I have been using steroid cream and that may be helping and the exercises are helping learn to relax better.  I don't know for sure at this point but I only had to get up 3x last night and over the last week and half has been getting a little better  03/27/22: I have been using more moisturizing.  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 I just had that appointment.  PAIN:  Are you having pain? No but has some discomfort in the pelvic floor region OBJECTIVE: (objective measures completed at initial evaluation unless otherwise dated)  PATIENT SURVEYS:    PFIQ-7 POPIQ = 19 (eval); POPIQ = 10 (02/15/22)   COGNITION:            Overall cognitive status: Within functional limits for tasks assessed                              MUSCLE  LENGTH: Hamstrings: Right 80 deg; Left 70 deg     LUMBAR SPECIAL TESTS:  ASLR - harder on left but a little easier with compression   FUNCTIONAL TESTS:    SLS trendelenburg Rt side; unsteady bil GAIT:   Comments: WFL   POSTURE:  Rt hip elevated in standing   LUMBARAROM/PROM   A/PROM A/PROM  10/31/2021  Flexion 75%  Extension    Right lateral flexion    Left lateral flexion    Right rotation    Left rotation     (Blank rows = not tested)   LE ROM:   Passive ROM Right 10/31/2021 Left 10/31/2021  Hip flexion 80% 90%  Hip extension      Hip abduction      Hip adduction      Hip internal rotation 50% 75%  Hip external rotation 50% 75%  Knee flexion      Knee extension      Ankle dorsiflexion      Ankle plantarflexion      Ankle inversion      Ankle eversion       (Blank rows = not tested)   LE MMT:   MMT Right 10/31/2021 Left 10/31/2021  Hip flexion      Hip extension      Hip abduction      Hip adduction      Hip internal rotation      Hip external rotation      Knee flexion      Knee extension      Ankle dorsiflexion      Ankle plantarflexion      Ankle inversion      Ankle eversion        PELVIC MMT:   MMT   10/31/2021  Vaginal 2/5 (weak squeeze, definite lift)  Internal Anal Sphincter    External Anal Sphincter    Puborectalis    Diastasis Recti    (Blank rows = not tested)         PALPATION:   General  tension on the Rt flank and slight side bend in standing                 External Perineal Exam descending perineal body                             Internal Pelvic Floor decreased closure and TTP Lt side more than Rt; difficulty holding contraction and fatigues quickly with reps; stiff muscles   TONE: high   PROLAPSE: None observed - did not do bearing down   03/15/22 - observed bearing down with minimal anterior wall prolapse; observed in standing and no prolapse noted  TODAY'S TREATMENT  Date: 05/02/22   NeuroReED Half kneel and  standing with pallof  press - educated on posture during the exercises Manual Patient confirms identification and approves physical therapist to perform internal soft tissue work  - vaginal canal stretch at level of bladder  Date: 04/23/22  NeuroReED Biofeedback:started at 150 mV Diaphragmatic breathing  Diaphragmatic breathing knee hugs in supine* most relaxed lowest 3-5 mV Diaphragmatic breathing  single leg hugs in supine Diaphragmatic breathing  with pelvic floor relaxtion  Diaphragmatic breathing butterfly  Diaphragmatic breathing child pose Diaphragmatic breathing  quad ped - *relaxed after during child pose 5-47m Diaphragmatic breathing  stand bent over table   Date: 04/12/22  Self care Reviewed moisturizing and techniques to avoid irritation - try nitril gloves and don't do for more than 30 strokes on each side  Manual Patient confirms identification and approves physical therapist to perform internal soft tissue work   compressor urethra mobs and urethra fascial release performed bilaterally Re-assess strength and coordination - MMT is 3/5 and hold for 3-4 seconds          PATIENT EDUCATION:  Education details: sample given; Access Code: [email or web address] Person educated: Patient Education method: Explanation Education comprehension: verbalized understanding     HOME EXERCISE PROGRAM: Access Code: [email or web address]: [email or web address] Date: 04/23/2022 Prepared by: JJari FavreExercises - Child's Pose Stretch - 1 x daily - 7 x weekly - 1 sets - 3 reps - 30-60 hold - Diaphragmatic Breathing in Supported Child's Pose with Pelvic Floor Relaxation - 1 x daily - 7 x weekly - 3 sets - 10 reps - Standing 'L' Stretch at Counter - 1 x daily - 7 x weekly - 3 sets - 10 reps - Rock - 1 x daily - 7 x weekly - 3 sets - 10 reps - Quadruped Transversus Abdominis Bracing - 1 x daily - 7 x weekly - 2 sets - 10 reps - 5 sec hold - Supine Double Knee to Chest - 1 x daily - 7 x  weekly - 1 sets - 3 reps - 30 sec hold  ASSESSMENT:   CLINICAL IMPRESSION: Pt still having difficulty with main goal of nocturia reduction.  Pt reports it finally seems to be getting better and pt notices that when she is relaxing there is less irritation at the urethra.  Today's session focused on increased soft tissue length for improved function.  Pt also needed review of core exercises to continue to address core strength.  Pt will benefit from skilled PT to continue to address pelvic floor muscle flexibility and coordination for maximum improvement in sleep and quality of life   OBJECTIVE IMPAIRMENTS decreased coordination, decreased endurance, decreased ROM, decreased strength, postural dysfunction, and pain.    ACTIVITY LIMITATIONS community activity and interpersonal relationships .    PERSONAL FACTORS 1-2 comorbidities: history of breast cancer, radiation, chemo, anti-estrogen medicine  are also affecting patient's functional outcome.      REHAB POTENTIAL: Excellent   CLINICAL DECISION MAKING: Evolving/moderate complexity   EVALUATION COMPLEXITY: Moderate     GOALS: Goals reviewed with patient? Yes   SHORT TERM GOALS: Target date: 11/28/2021   Ind with initial HEP Baseline: 11/27/21  Goal status: met 12/05/21  2.  Ind with moisturizer for vaginal canal and has found one that feels good for reducing chaffing. Baseline: 01/03/22  Goal status: met       LONG TERM GOALS: Target date: 07/05/2022  updated 04/23/2022     Pt will be independent with advanced HEP to maintain improvements made throughout therapy   Baseline:  Goal status: Ongoing  2.  Pt will report 75% reduction of pain due to improvements in posture, strength, and muscle length   Baseline: not having pain Goal status: Met   3.  Pt will have POPIQ score improved by least 5 points. Baseline: 10 - improved by 9 Goal status: MET   4.  Pt will have nocturia decreased to 1-2/night at most    Baseline: is about 6x at the most but seems like it is getting better, last night was 3x Goal status: IN PROGRESS   5.  Pt will not have pain with voiding due to improved skin integrity and muscle tone Baseline:  Goal status: met 6.  Pt will be able to do all functional exercises without leakage Baseline: not leaking with exercises Goal status: IN PROGRESS        PLAN: PT FREQUENCY: 1x/week   PT DURATION: 12 weeks   PLANNED INTERVENTIONS: Therapeutic exercises, Therapeutic activity, Neuromuscular re-education, Balance training, Gait training, Patient/Family education, Joint mobilization, Dry Needling, Electrical stimulation, Spinal mobilization, Cryotherapy, Moist heat, Taping, Biofeedback, and Manual therapy   PLAN FOR NEXT SESSION: f/u on adding core exercises and response to continued soft tissue release vaginally at night  [employer], PT 05/09/22 7:57 AM

## 2022-05-10 DIAGNOSIS — R1310 Dysphagia, unspecified: Secondary | ICD-10-CM | POA: Diagnosis not present

## 2022-05-10 DIAGNOSIS — E042 Nontoxic multinodular goiter: Secondary | ICD-10-CM | POA: Diagnosis not present

## 2022-05-10 DIAGNOSIS — F4322 Adjustment disorder with anxiety: Secondary | ICD-10-CM | POA: Diagnosis not present

## 2022-05-14 NOTE — Progress Notes (Signed)
Teresa Sheppard, counseling intern, met with patient for their scheduled counseling session.   Patient shared they are feeling a "little anxious" because of family birthdays and the holidays in quick succession. The patient shared about their experience with their mother and father when they passed away. The patient shared she was able to be with her father as he took his last breath. The patient shared she wants to be alive for her children until they are older than "[she] was when [her] parents passed away."   The patient shared her oldest son is flying home for Thanksgiving which she reported made her feel happy and relieved.   The patient scheduled their next counseling session for Thursday, December 14th at [facility],  Counseling Intern  [phone] [email or web address]

## 2022-05-18 ENCOUNTER — Other Ambulatory Visit (HOSPITAL_COMMUNITY): Payer: Self-pay

## 2022-05-21 DIAGNOSIS — L603 Nail dystrophy: Secondary | ICD-10-CM | POA: Diagnosis not present

## 2022-05-21 DIAGNOSIS — L600 Ingrowing nail: Secondary | ICD-10-CM | POA: Diagnosis not present

## 2022-05-23 ENCOUNTER — Encounter: Payer: Self-pay | Admitting: Physical Therapy

## 2022-05-23 ENCOUNTER — Ambulatory Visit: Payer: BC Managed Care – PPO | Admitting: Physical Therapy

## 2022-05-23 DIAGNOSIS — M62838 Other muscle spasm: Secondary | ICD-10-CM | POA: Diagnosis not present

## 2022-05-23 DIAGNOSIS — Z170 Estrogen receptor positive status [ER+]: Secondary | ICD-10-CM | POA: Diagnosis not present

## 2022-05-23 DIAGNOSIS — M6281 Muscle weakness (generalized): Secondary | ICD-10-CM

## 2022-05-23 DIAGNOSIS — C50811 Malignant neoplasm of overlapping sites of right female breast: Secondary | ICD-10-CM | POA: Diagnosis not present

## 2022-05-23 NOTE — Therapy (Signed)
OUTPATIENT PHYSICAL THERAPY TREATMENT NOTE   Patient Name: Teresa Sheppard MRN: [id number] DOB:August 29, 1960, 61 y.o., female Today's Date: 04/12/2022  PCP: Wenda Low, MD REFERRING PROVIDER:  Nicholas Lose, MD    END OF SESSION:   PT End of Session - 05/23/22 1452     Visit Number 12    Date for PT Re-Evaluation 07/05/22    PT Start Time 1452   late   PT Stop Time 1541    PT Time Calculation (min) 49 min    Activity Tolerance Patient tolerated treatment well    Behavior During Therapy [facility] for tasks assessed/performed                       Past Medical History:  Diagnosis Date   Anxiety    Breast cancer in female [facility])    Right   Family history of breast cancer 11/07/2020   Family history of pancreatic cancer 11/07/2020   History of kidney stones 2014-2015   Hyperthyroidism    No longer an issue   PONV (postoperative nausea and vomiting)    Thyromegaly    Past Surgical History:  Procedure Laterality Date   ABDOMINAL WALL DEFECT REPAIR     following hernia repair in 2010   [place]     2008  while pregnant with last child. Scar tissue from appendectomy wrapped around bowel and was snipped to free bowel.    CESAREAN SECTION     DILATION AND CURETTAGE OF UTERUS     2000 and 2014   HERNIA REPAIR     MASTECTOMY W/ SENTINEL NODE BIOPSY Right 11/28/2020   Procedure: RIGHT MASTECTOMY WITH RIGHT AXILLARY SENTINEL LYMPH NODE BIOPSY;  Surgeon: [name], MD;  Location: [place];  Service: General;  Laterality: Right;   PORT-A-CATH REMOVAL Left 01/30/2021   Procedure: PORT REMOVAL;  Surgeon: [name], MD;  Location: [place];  Service: General;  Laterality: Left;  LOCAL   PORTACATH PLACEMENT Left 07/12/2020   Procedure: INSERTION PORT-A-CATH WITH ULTRASOUND GUIDANCE;  Surgeon: [name], MD;  Location: [place];  Service: General;  Laterality: Left;   RADIOACTIVE SEED GUIDED AXILLARY SENTINEL LYMPH NODE Right 11/28/2020    Procedure: RADIOACTIVE SEED GUIDED RIGHT AXILLARY AXILLARY SENTINEL LYMPH NODE EXCISION;  Surgeon: [name], MD;  Location: [place];  Service: General;  Laterality: Right;   RADIOACTIVE SEED GUIDED EXCISIONAL BREAST BIOPSY Left 11/28/2020   Procedure: RADIOACTIVE SEED GUIDED LEFT EXCISIONAL BREAST BIOPSY;  Surgeon: [name], MD;  Location: [place];  Service: General;  Laterality: Left;   ROOT CANAL     2019   TONSILLECTOMY  1969   URETERAL EXPLORATION     dilataion for congenitally small ureter   Patient Active Problem List   Diagnosis Date Noted   Chemotherapy-induced peripheral neuropathy ([place]) 10/24/2021   Vitamin D deficiency 08/30/2021   Osteoporosis 08/30/2021   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   Genetic testing 11/15/2020   Family history of breast cancer 11/07/2020   Family history of pancreatic cancer 11/07/2020   Dysphagia 10/26/2020   Malignant neoplasm of overlapping sites of right breast in female, estrogen receptor positive ([place]) 06/21/2020   Hyperthyroidism 03/28/2016    REFERRING DIAG: C50.811,Z17.0 (ICD-10-CM) - Malignant neoplasm of overlapping sites of right breast in female, estrogen receptor positive ([place])  THERAPY DIAG:  No diagnosis found.  Rationale for Evaluation and Treatment Rehabilitation  PERTINENT HISTORY: Hernia repair 2010, appendectomy, bowel resection, c-section, breast  cancer, chronic UTI  PRECAUTIONS: None  SUBJECTIVE:  05/23/2022: She is still waking up 4 times at night. She feels like it is effecting her sleep. She got treated for a UTI last week. She is not having the burning as much anymore with urination . She was able to do the internal massage this morning. Her gynecologist recommended her to try olive oil instead of the vitamin E oil or coconut oil. The core exercise are going well but she wants to find a better balance between stretching and exercise.   05/02/22: I have been using steroid cream and that may be  helping and the exercises are helping learn to relax better.  I don't know for sure at this point but I only had to get up 3x last night and over the last week and half has been getting a little better  03/27/22: I have been using more moisturizing.  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 I just had that appointment.  PAIN:  Are you having pain? No but has some discomfort in the pelvic floor region OBJECTIVE: (objective measures completed at initial evaluation unless otherwise dated)  PATIENT SURVEYS:    PFIQ-7 POPIQ = 19 (eval); POPIQ = 10 (02/15/22)   COGNITION:            Overall cognitive status: Within functional limits for tasks assessed                              MUSCLE LENGTH: Hamstrings: Right 80 deg; Left 70 deg     LUMBAR SPECIAL TESTS:  ASLR - harder on left but a little easier with compression   FUNCTIONAL TESTS:    SLS trendelenburg Rt side; unsteady bil GAIT:   Comments: WFL   POSTURE:  Rt hip elevated in standing   LUMBARAROM/PROM   A/PROM A/PROM  10/31/2021  Flexion 75%  Extension    Right lateral flexion    Left lateral flexion    Right rotation    Left rotation     (Blank rows = not tested)   LE ROM:   Passive ROM Right 10/31/2021 Left 10/31/2021  Hip flexion 80% 90%  Hip extension      Hip abduction      Hip adduction      Hip internal rotation 50% 75%  Hip external rotation 50% 75%  Knee flexion      Knee extension      Ankle dorsiflexion      Ankle plantarflexion      Ankle inversion      Ankle eversion       (Blank rows = not tested)   LE MMT:   MMT Right 10/31/2021 Left 10/31/2021  Hip flexion      Hip extension      Hip abduction      Hip adduction      Hip internal rotation      Hip external rotation      Knee flexion      Knee extension      Ankle dorsiflexion      Ankle plantarflexion      Ankle inversion      Ankle eversion        PELVIC MMT:   MMT    10/31/2021  Vaginal 2/5 (weak squeeze, definite lift)  Internal Anal Sphincter    External Anal Sphincter  Puborectalis    Diastasis Recti    (Blank rows = not tested)         PALPATION:   General  tension on the Rt flank and slight side bend in standing                 External Perineal Exam descending perineal body                             Internal Pelvic Floor decreased closure and TTP Lt side more than Rt; difficulty holding contraction and fatigues quickly with reps; stiff muscles   TONE: high   PROLAPSE: None observed - did not do bearing down   03/15/22 - observed bearing down with minimal anterior wall prolapse; observed in standing and no prolapse noted  TODAY'S TREATMENT  Date: 05/02/22  NeuroReED- mV  Biofeedback:started at 873m Diaphragmatic breathing 500 Diaphragmatic breathing knee hugs in supine Diaphragmatic breathing  single leg hugs in supine Diaphragmatic breathing  knee hug rocks- 6-141mDiaphragmatic breathing butterfly 80034miaphragmatic breathing child pose 50-80m44miaphragmatic breathing  quad ped - *relaxed after during child pose  Diaphragmatic breathing  stand bent over table 30mV59mf care Demo of self internal massage of pelvic floor muscle with visual aid of mirror for identification of structures.  Date: 05/02/22   NeuroReED Half kneel and standing with pallof press - educated on posture during the exercises Manual Patient confirms identification and approves physical therapist to perform internal soft tissue work  - vaginal canal stretch at level of bladder  Date: 04/23/22  NeuroReED Biofeedback:started at 150 mV Diaphragmatic breathing  Diaphragmatic breathing knee hugs in supine* most relaxed lowest 3-5 mV Diaphragmatic breathing  single leg hugs in supine Diaphragmatic breathing  with pelvic floor relaxtion  Diaphragmatic breathing butterfly  Diaphragmatic breathing child pose Diaphragmatic breathing  quad ped -  *relaxed after during child pose 5-9mV D3mhragmatic breathing  stand bent over table   Date: 04/12/22  Self care Reviewed moisturizing and techniques to avoid irritation - try nitril gloves and don't do for more than 30 strokes on each side  Manual Patient confirms identification and approves physical therapist to perform internal soft tissue work   compressor urethra mobs and urethra fascial release performed bilaterally Re-assess strength and coordination - MMT is 3/5 and hold for 3-4 seconds          PATIENT EDUCATION:  Education details: sample given; Access Code: [email or web address] Person educated: Patient Education method: Explanation Education comprehension: verbalized understanding     HOME EXERCISE PROGRAM: Access Code: [email or web address] Date: 04/23/2022 Prepared by: JacqueJari Favreises - Child's Pose Stretch - 1 x daily - 7 x weekly - 1 sets - 3 reps - 30-60 hold - Diaphragmatic Breathing in Supported Child's Pose with Pelvic Floor Relaxation - 1 x daily - 7 x weekly - 3 sets - 10 reps - Standing 'L' Stretch at Counter - 1 x daily - 7 x weekly - 3 sets - 10 reps - Rock - 1 x daily - 7 x weekly - 3 sets - 10 reps - Quadruped Transversus Abdominis Bracing - 1 x daily - 7 x weekly - 2 sets - 10 reps - 5 sec hold - Supine Double Knee to Chest - 1 x daily - 7 x weekly - 1 sets - 3 reps - 30 sec hold  ASSESSMENT:   CLINICAL IMPRESSION: Pt is having continued difficulty with relaxation of then  pelvic floor muscles.Today's we demonstrated how and where to the internal self massage with the visual feedback using a mirror to help with identification of structures. Today, we used the biofeedback to provide a visual and audio aid for patient to see what stretches helps with pelvic floor relaxation. As noted last time using the bio-feedback, the patient was able to relax her pelvic floor muscle the most the knee hugs to chest and bent over the table.   With the Biofeedback the patient ranged from about 86m- 641mdepending on the position and stretches which provided great information on how to proceed with treatments. Patient was educated to try stretching after doing any exercises to help with pelvic floor relaxation.  Pt will benefit from skilled PT to continue to address pelvic floor muscle flexibility and coordination for maximum improvement in sleep and quality of life   OBJECTIVE IMPAIRMENTS decreased coordination, decreased endurance, decreased ROM, decreased strength, postural dysfunction, and pain.    ACTIVITY LIMITATIONS community activity and interpersonal relationships .    PERSONAL FACTORS 1-2 comorbidities: history of breast cancer, radiation, chemo, anti-estrogen medicine  are also affecting patient's functional outcome.      REHAB POTENTIAL: Excellent   CLINICAL DECISION MAKING: Evolving/moderate complexity   EVALUATION COMPLEXITY: Moderate     GOALS: Goals reviewed with patient? Yes   SHORT TERM GOALS: Target date: 11/28/2021   Ind with initial HEP Baseline: 11/27/21  Goal status: met 12/05/21  2.  Ind with moisturizer for vaginal canal and has found one that feels good for reducing chaffing. Baseline: 01/03/22  Goal status: met       LONG TERM GOALS: Target date: 07/05/2022  updated 05/23/2022      Pt will be independent with advanced HEP to maintain improvements made throughout therapy   Baseline:  Goal status: Ongoing   2.  Pt will report 75% reduction of pain due to improvements in posture, strength, and muscle length   Baseline: not having pain Goal status: Met   3.  Pt will have POPIQ score improved by least 5 points. Baseline: 10 - improved by 9 Goal status: MET   4.  Pt will have nocturia decreased to 1-2/night at most   Baseline: she states she is still going on average 4 times at night  Goal status: IN PROGRESS   5.  Pt will not have pain with voiding due to improved skin integrity  and muscle tone Baseline:  Goal status: met 6.  Pt will be able to do all functional exercises without leakage Baseline: not leaking with exercises Goal status: IN PROGRESS        PLAN: PT FREQUENCY: 1x/week   PT DURATION: 12 weeks   PLANNED INTERVENTIONS: Therapeutic exercises, Therapeutic activity, Neuromuscular re-education, Balance training, Gait training, Patient/Family education, Joint mobilization, Dry Needling, Electrical stimulation, Spinal mobilization, Cryotherapy, Moist heat, Taping, Biofeedback, and Manual therapy   PLAN FOR NEXT SESSION: starting with pelvic floor relaxation stretches then core strengthening and ending with stretches, F/U on self massage - probably wants to use biofeedback again  TaRosario JacksStudent-PT 05/23/2022  3:41 PM    I agree with the following treatment note after reviewing documentation. This session was performed under the supervision of a licensed clinician.  JaGustavus BryantPT 05/23/22 3:53 PM

## 2022-05-28 ENCOUNTER — Inpatient Hospital Stay: Payer: BC Managed Care – PPO | Attending: Hematology and Oncology | Admitting: Pharmacist

## 2022-05-28 DIAGNOSIS — N390 Urinary tract infection, site not specified: Secondary | ICD-10-CM | POA: Diagnosis not present

## 2022-05-28 DIAGNOSIS — N94810 Vulvar vestibulitis: Secondary | ICD-10-CM | POA: Diagnosis not present

## 2022-05-28 DIAGNOSIS — C50811 Malignant neoplasm of overlapping sites of right female breast: Secondary | ICD-10-CM | POA: Insufficient documentation

## 2022-05-28 NOTE — Progress Notes (Signed)
[place]       Telephone: [phone]?Fax: [phone]   Oncology Clinical Pharmacist Practitioner Progress Note  Teresa Sheppard is a 61 y.o. female with a diagnosis of breast cancer currently on neratinib in the extended adjuvant setting under the care of Dr. Nicholas Lose.   I connected with [name] today by telephone and verified that I was speaking with the correct person using two patient identifiers.   Other persons participating in the visit and their role in the encounter: n/a   Patient's location: home  Provider's location: clinic  Clinical pharmacy received a voicemail from Ms. Buhrman regarding insurance changes at the start of the new year and whether melatonin could be taken as needed for sleep.   We reviewed her insurance information with the oral chemotherapy pharmacy team and they confirmed that they will be filling out all the necessary paperwork and there is nothing needed from Ms. Krupka at this time. This was discussed with Ms. Odonnel by clinical pharmacy today.  We also reviewed that melatonin is a reasonable option for sleep assistance as needed and discussed that most pharmacies should have several different formulations to choose from.   She next sees [name] on 06/28/22 with labs and knows she can contact his clinica with questions or concerns in the interim.  Erick Blinks Stull participated in the discussion, expressed understanding, and voiced agreement with the above plan. All questions were answered to her satisfaction. The patient was advised to contact the clinic at [phone] with any questions or concerns prior to her return visit.  Clinical pharmacy will continue to [place] and Dr. Nicholas Lose as needed.  Raina Mina, RPH-CPP,  05/28/2022  1:54 PM   **Disclaimer: This note was dictated with voice recognition software. Similar sounding words can inadvertently be transcribed and this note may contain  transcription errors which may not have been corrected upon publication of note.**

## 2022-05-29 ENCOUNTER — Other Ambulatory Visit (HOSPITAL_COMMUNITY): Payer: Self-pay

## 2022-05-30 ENCOUNTER — Other Ambulatory Visit (HOSPITAL_COMMUNITY): Payer: Self-pay

## 2022-05-30 DIAGNOSIS — R1310 Dysphagia, unspecified: Secondary | ICD-10-CM | POA: Diagnosis not present

## 2022-05-30 DIAGNOSIS — E0590 Thyrotoxicosis, unspecified without thyrotoxic crisis or storm: Secondary | ICD-10-CM | POA: Diagnosis not present

## 2022-05-30 DIAGNOSIS — E042 Nontoxic multinodular goiter: Secondary | ICD-10-CM | POA: Diagnosis not present

## 2022-05-30 DIAGNOSIS — C50919 Malignant neoplasm of unspecified site of unspecified female breast: Secondary | ICD-10-CM | POA: Diagnosis not present

## 2022-05-31 ENCOUNTER — Other Ambulatory Visit: Payer: Self-pay

## 2022-05-31 ENCOUNTER — Other Ambulatory Visit (HOSPITAL_COMMUNITY): Payer: Self-pay

## 2022-06-04 ENCOUNTER — Ambulatory Visit: Payer: BC Managed Care – PPO | Attending: Hematology and Oncology | Admitting: Physical Therapy

## 2022-06-04 ENCOUNTER — Encounter: Payer: Self-pay | Admitting: Physical Therapy

## 2022-06-04 DIAGNOSIS — Z170 Estrogen receptor positive status [ER+]: Secondary | ICD-10-CM | POA: Diagnosis not present

## 2022-06-04 DIAGNOSIS — M62838 Other muscle spasm: Secondary | ICD-10-CM | POA: Diagnosis not present

## 2022-06-04 DIAGNOSIS — M6281 Muscle weakness (generalized): Secondary | ICD-10-CM | POA: Insufficient documentation

## 2022-06-04 DIAGNOSIS — C50811 Malignant neoplasm of overlapping sites of right female breast: Secondary | ICD-10-CM | POA: Diagnosis not present

## 2022-06-04 NOTE — Therapy (Signed)
OUTPATIENT PHYSICAL THERAPY TREATMENT NOTE   Patient Name: Teresa Sheppard MRN: [id number] DOB:[birth year], 61 y.o., female Today's Date: 04/12/2022  PCP: Wenda Low, MD REFERRING PROVIDER:  Nicholas Lose, MD    END OF SESSION:   PT End of Session - 06/04/22 1453     Visit Number 13    Date for PT Re-Evaluation 07/05/22    Authorization Type BC[employer]    PT Start Time 1450    PT Stop Time 0102    PT Time Calculation (min) 40 min    Activity Tolerance Patient tolerated treatment well    Behavior During Therapy WFL for tasks assessed/performed                        Past Medical History:  Diagnosis Date   Anxiety    Breast cancer in female [facility])    Right   Family history of breast cancer 11/07/2020   Family history of pancreatic cancer 11/07/2020   History of kidney stones 2014-2015   Hyperthyroidism    No longer an issue   PONV (postoperative nausea and vomiting)    Thyromegaly    Past Surgical History:  Procedure Laterality Date   ABDOMINAL WALL DEFECT REPAIR     following hernia repair in 2010   [place]     2008  while pregnant with last child. Scar tissue from appendectomy wrapped around bowel and was snipped to free bowel.    CESAREAN SECTION     DILATION AND CURETTAGE OF UTERUS     2000 and 2014   HERNIA REPAIR     MASTECTOMY W/ SENTINEL NODE BIOPSY Right 11/28/2020   Procedure: RIGHT MASTECTOMY WITH RIGHT AXILLARY SENTINEL LYMPH NODE BIOPSY;  Surgeon: [name], MD;  Location: [place];  Service: General;  Laterality: Right;   PORT-A-CATH REMOVAL Left 01/30/2021   Procedure: PORT REMOVAL;  Surgeon: [name], MD;  Location: [place];  Service: General;  Laterality: Left;  LOCAL   PORTACATH PLACEMENT Left 07/12/2020   Procedure: INSERTION PORT-A-CATH WITH ULTRASOUND GUIDANCE;  Surgeon: [name], MD;  Location: [place];  Service: General;  Laterality: Left;   RADIOACTIVE SEED GUIDED AXILLARY SENTINEL  LYMPH NODE Right 11/28/2020   Procedure: RADIOACTIVE SEED GUIDED RIGHT AXILLARY AXILLARY SENTINEL LYMPH NODE EXCISION;  Surgeon: [name], MD;  Location: [place];  Service: General;  Laterality: Right;   RADIOACTIVE SEED GUIDED EXCISIONAL BREAST BIOPSY Left 11/28/2020   Procedure: RADIOACTIVE SEED GUIDED LEFT EXCISIONAL BREAST BIOPSY;  Surgeon: [name], MD;  Location: [place];  Service: General;  Laterality: Left;   ROOT CANAL     2019   TONSILLECTOMY  1969   URETERAL EXPLORATION     dilataion for congenitally small ureter   Patient Active Problem List   Diagnosis Date Noted   Chemotherapy-induced peripheral neuropathy ([place]) 10/24/2021   Vitamin D deficiency 08/30/2021   Osteoporosis 08/30/2021   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   Genetic testing 11/15/2020   Family history of breast cancer 11/07/2020   Family history of pancreatic cancer 11/07/2020   Dysphagia 10/26/2020   Malignant neoplasm of overlapping sites of right breast in female, estrogen receptor positive ([place]) 06/21/2020   Hyperthyroidism 03/28/2016    REFERRING DIAG: C50.811,Z17.0 (ICD-10-CM) - Malignant neoplasm of overlapping sites of right breast in female, estrogen receptor positive ([place])  THERAPY DIAG:  Muscle weakness (generalized)  Other muscle spasm  Malignant neoplasm of overlapping sites of right breast  in female, estrogen receptor positive ([place])  Rationale for Evaluation and Treatment Rehabilitation  PERTINENT HISTORY: Hernia repair 2010, appendectomy, bowel resection, c-section, breast cancer, chronic UTI  PRECAUTIONS: None  SUBJECTIVE:  06/04/22 I slept 4 solid hours last night which was good, but still waking about 3/night typically.  I had a little irritation of the bladder and thought it was going to be infection but it wasn't.  I am not feeling bad now.  05/23/2022: She is still waking up 4 times at night. She feels like it is effecting her sleep. She got treated for a UTI  last week. She is not having the burning as much anymore with urination . She was able to do the internal massage this morning. Her gynecologist recommended her to try olive oil instead of the vitamin E oil or coconut oil. The core exercise are going well but she wants to find a better balance between stretching and exercise.    PAIN:  Are you having pain? No but has some discomfort in the pelvic floor region OBJECTIVE: (objective measures completed at initial evaluation unless otherwise dated)  PATIENT SURVEYS:    PFIQ-7 POPIQ = 19 (eval); POPIQ = 10 (02/15/22)   COGNITION:            Overall cognitive status: Within functional limits for tasks assessed                              MUSCLE LENGTH: Hamstrings: Right 80 deg; Left 70 deg     LUMBAR SPECIAL TESTS:  ASLR - harder on left but a little easier with compression   FUNCTIONAL TESTS:    SLS trendelenburg Rt side; unsteady bil GAIT:   Comments: WFL   POSTURE:  Rt hip elevated in standing   LUMBARAROM/PROM   A/PROM A/PROM  10/31/2021  Flexion 75%  Extension    Right lateral flexion    Left lateral flexion    Right rotation    Left rotation     (Blank rows = not tested)   LE ROM:   Passive ROM Right 10/31/2021 Left 10/31/2021  Hip flexion 80% 90%  Hip extension      Hip abduction      Hip adduction      Hip internal rotation 50% 75%  Hip external rotation 50% 75%  Knee flexion      Knee extension      Ankle dorsiflexion      Ankle plantarflexion      Ankle inversion      Ankle eversion       (Blank rows = not tested)   LE MMT:   MMT Right 10/31/2021 Left 10/31/2021  Hip flexion      Hip extension      Hip abduction      Hip adduction      Hip internal rotation      Hip external rotation      Knee flexion      Knee extension      Ankle dorsiflexion      Ankle plantarflexion      Ankle inversion      Ankle eversion        PELVIC MMT:   MMT   10/31/2021  Vaginal 2/5 (weak squeeze, definite  lift)  Internal Anal Sphincter    External Anal Sphincter    Puborectalis    Diastasis Recti    (Blank rows = not tested)  PALPATION:   General  tension on the Rt flank and slight side bend in standing                 External Perineal Exam descending perineal body                             Internal Pelvic Floor decreased closure and TTP Lt side more than Rt; difficulty holding contraction and fatigues quickly with reps; stiff muscles   TONE: high   PROLAPSE: None observed - did not do bearing down   03/15/22 - observed bearing down with minimal anterior wall prolapse; observed in standing and no prolapse noted  TODAY'S TREATMENT  Date: 06/04/22  Exercises: Pelvic floor rocking and circles in qped with coccyx mobs Side bend in qped with coccyx mobs Knee hugs with lumbar rotation Therapeutic Activities: Bladder irritants and bladder journal  - information provided and reviewed to ensure pt understands how and why to use   Date: 05/02/22   NeuroReED Half kneel and standing with pallof press - educated on posture during the exercises Manual Patient confirms identification and approves physical therapist to perform internal soft tissue work  - vaginal canal stretch at level of bladder  Date: 04/23/22  NeuroReED Biofeedback:started at 150 mV Diaphragmatic breathing  Diaphragmatic breathing knee hugs in supine* most relaxed lowest 3-5 mV Diaphragmatic breathing  single leg hugs in supine Diaphragmatic breathing  with pelvic floor relaxtion  Diaphragmatic breathing butterfly  Diaphragmatic breathing child pose Diaphragmatic breathing  quad ped - *relaxed after during child pose 5-26m Diaphragmatic breathing  stand bent over table       PATIENT EDUCATION:  Education details: sample given; Access Code: [email or web address] Person educated: Patient Education method: Explanation Education comprehension: verbalized understanding     HOME EXERCISE PROGRAM: Access Code:  [email or web address]: [email or web address] Date: 04/23/2022 Prepared by: JJari FavreExercises - Child's Pose Stretch - 1 x daily - 7 x weekly - 1 sets - 3 reps - 30-60 hold - Diaphragmatic Breathing in Supported Child's Pose with Pelvic Floor Relaxation - 1 x daily - 7 x weekly - 3 sets - 10 reps - Standing 'L' Stretch at Counter - 1 x daily - 7 x weekly - 3 sets - 10 reps - Rock - 1 x daily - 7 x weekly - 3 sets - 10 reps - Quadruped Transversus Abdominis Bracing - 1 x daily - 7 x weekly - 2 sets - 10 reps - 5 sec hold - Supine Double Knee to Chest - 1 x daily - 7 x weekly - 1 sets - 3 reps - 30 sec hold  ASSESSMENT:   CLINICAL IMPRESSION: Pt is still having to wake a lot at night.  Today's session discussed other ways to assess if there is something irritating the bladder such as types of foods to consider. Pt was given a bladder journal with 3 pages to complete.  Stretches with tactile cues to relax were completed and added to HEP. Pt will benefit from skilled PT to continue to address impairments and assist with symptom management so she is able to improve sleep hygiene and quality of life.   OBJECTIVE IMPAIRMENTS decreased coordination, decreased endurance, decreased ROM, decreased strength, postural dysfunction, and pain.    ACTIVITY LIMITATIONS community activity and interpersonal relationships .    PERSONAL FACTORS 1-2 comorbidities: history of breast cancer, radiation, chemo, anti-estrogen medicine  are also affecting patient's functional outcome.  REHAB POTENTIAL: Excellent   CLINICAL DECISION MAKING: Evolving/moderate complexity   EVALUATION COMPLEXITY: Moderate     GOALS: Goals reviewed with patient? Yes   SHORT TERM GOALS: Target date: 11/28/2021   Ind with initial HEP Baseline: 11/27/21  Goal status: met 12/05/21  2.  Ind with moisturizer for vaginal canal and has found one that feels good for reducing chaffing. Baseline: 01/03/22  Goal status: met        LONG TERM GOALS: Target date: 07/05/2022  updated 05/23/2022      Pt will be independent with advanced HEP to maintain improvements made throughout therapy   Baseline:  Goal status: Ongoing   2.  Pt will report 75% reduction of pain due to improvements in posture, strength, and muscle length   Baseline: not having pain Goal status: Met   3.  Pt will have POPIQ score improved by least 5 points. Baseline: 10 - improved by 9 Goal status: MET   4.  Pt will have nocturia decreased to 1-2/night at most   Baseline: she states she is still going on average 4 times at night  Goal status: IN PROGRESS   5.  Pt will not have pain with voiding due to improved skin integrity and muscle tone Baseline:  Goal status: met 6.  Pt will be able to do all functional exercises without leakage Baseline: rarely getting leakage and is exercising Goal status: MET        PLAN: PT FREQUENCY: 1x/week   PT DURATION: 12 weeks   PLANNED INTERVENTIONS: Therapeutic exercises, Therapeutic activity, Neuromuscular re-education, Balance training, Gait training, Patient/Family education, Joint mobilization, Dry Needling, Electrical stimulation, Spinal mobilization, Cryotherapy, Moist heat, Taping, Biofeedback, and Manual therapy   PLAN FOR NEXT SESSION: starting with pelvic floor relaxation stretches then core strengthening and ending with stretches, use biofeedback again  [employer], PT 06/04/22 2:53 PM

## 2022-06-04 NOTE — Patient Instructions (Signed)

## 2022-06-05 ENCOUNTER — Encounter: Payer: Self-pay | Admitting: Hematology and Oncology

## 2022-06-07 ENCOUNTER — Ambulatory Visit: Payer: BC Managed Care – PPO | Admitting: Pharmacist

## 2022-06-07 DIAGNOSIS — C50811 Malignant neoplasm of overlapping sites of right female breast: Secondary | ICD-10-CM | POA: Diagnosis not present

## 2022-06-07 DIAGNOSIS — Z170 Estrogen receptor positive status [ER+]: Secondary | ICD-10-CM

## 2022-06-07 NOTE — Progress Notes (Signed)
Lysle Morales, counseling intern, met with patient for their scheduled counseling session.   The patient shared that yesterday they thought they were getting another UTI. The patient shared they think it may have been a result from a vaginal massage the PT suggested as an exercise to prevent UTIs and pelvic pain. The patient reflected they did not want the PT to become frustrated with them when they reported their observation of the massage. The counselor reflected to the patient that not every intervention works with every client.   The patient shared they would like to be more emotionally intimate with their husband. The counselor and patient spoke of possible goals to reach greater emotional intimacy. For example, spending time just the two of them without talking about the children. The patient reflected when her and her husband spend time together without the children, they often reflect that they should do it more often.   The patient scheduled their next counseling session for Thursday, January 4th at [facility],  Counseling Intern  [phone] [email or web address]

## 2022-06-07 NOTE — Progress Notes (Signed)
[facility]       Telephone: [phone]?Fax: [phone]   Oncology Clinical Pharmacist Practitioner Progress Note  Teresa Sheppard was contacted via video visit to discuss her chemotherapy regimen for neratinib which they receive under the care of Dr. Nicholas Lose.   I connected with Teresa Sheppard today by video and verified that I was speaking with the correct person using two patient identifiers.   I discussed the limitations, risks, security and privacy concerns of performing an evaluation and management service by telemedicine and the availability of in-person appointments. I also discussed with the patient that there may be a patient responsible charge related to this service. The patient expressed understanding and agreed to proceed.   Other persons participating in the visit and their role in the encounter: none   Patient's location: home  Provider's location: clinic   Current treatment regimen and start date Neratinib (12/28/21) Letrozole (12/29/20)   Interval History She continues on neratinib 5 tablets (200 mg) by mouth daily on days 1 to 28 of a 28-day cycle. This is being given in combination with letrozole. Therapy is planned to continue until 1 year in the extended adjuvant setting.  Teresa Sheppard was seen today by clinical pharmacy as a follow-up to her neratinib management.  She last saw [name] on 03/29/22 and had a telephone encounter with clinical pharmacy on 04/19/22 and was last seen on 04/26/22.  Response to Therapy Teresa Sheppard requested a video visit with clinical pharmacy to discuss some questions she has prior to her upcoming trip to [place] for the holidays.  She had asked if it was okay to continue her biotin as needed and we felt this to be reasonable.  She had specific questions regarding her calcium levels and these have been within normal limits.  She also asked if it was okay to get a manicure/pedicure and we discussed that her infection  fighting cells have been within normal limits for some time but that she should take precautions to try to avoid any infection.  At this point, based on her last lab values, she should not be at an increased risk for infection based on her lab values.  Teresa Sheppard had also asked about eating sushi and raw vegetables.  We discussed that as long as everything is prepared properly and washed thoroughly, that her risk for infection should not be elevated on neratinib based on her last lab values.  Lastly, she asked about reducing her dose of neratinib while she is on vacation and as discussed prior, we felt this to be reasonable.  She also asked if she could take loperamide (Imodium) prophylactically while on vacation.  We felt this to be reasonable but we cautioned to monitor very closely for constipation and that another option would be to wait until she has loose stool which may not happen since she is lowering her dose of neratinib to 3 tablets (maintenance dose is 5 tablets per [name]).  She does also closely monitor her symptoms for UTI.  She says she felt a little bit of stinging yesterday but that has resolved.  She does take Azo cranberry as needed with relief.  She does have a history of chronic UTIs which she has seen urology in the past for.  She has a appointment with a urogyn specialist (Dr. Wannetta Sender) in March (09/03/22) to establish care.  She will have labs and a visit with [name] on 06/28/22 and see clinical pharmacy  again with labs on 08/27/2022. Labs, vitals, treatment parameters, and manufacturer guidelines assessing toxicity were reviewed with Teresa Sheppard today. Based on these values, patient is in agreement to continue neratinib therapy at this time.  Allergies Allergies  Allergen Reactions   Shellfish-Derived Products Anaphylaxis    Per patient whenever she consumes a lot gives tightness of throat   Ciprofibrate Other (See Comments)    Joint pain    Oxycodone Hcl    Shrimp  Extract Allergy Skin Test    Hydrocodone Anxiety and Nausea Only   Penicillins Rash   Vitals = no vitals today (virtual visit)  Laboratory Data = no labs today (virtual visit)  Adverse Effects Assessment Diarrhea: Teresa Sheppard does a very good job adjusting her diet and taking loperamide as needed. She likely will reduce her dose of neratinib while on vacation which we felt to be reasonable  Adherence Assessment Teresa Sheppard reports missing 0 doses over the past 4 weeks.   Reason for missed dose: n/a Patient was re-educated on importance of adherence.   Access Assessment Teresa Sheppard is currently receiving her neratinib through [facility] concerns:  none  Medication Reconciliation The patient's medication list was reviewed today with the patient? Yes New medications or herbal supplements have recently been started? No  Any medications have been discontinued? No  The medication list was updated and reconciled based on the patient's most recent medication list in the electronic medical record (EMR) including herbal products and OTC medications.   Medications Current Outpatient Medications  Medication Sig Dispense Refill   acetaminophen (TYLENOL) 325 MG tablet Take 325 mg by mouth every 6 (six) hours as needed (headaches / pain).     Calcium-Vitamin D-Vitamin K ([employer] CALCIUM SOFT CHEWS) 650-12.5-40 MG-MCG-MCG CHEW Chew 1 tablet by mouth 2 (two) times daily.     cholecalciferol (VITAMIN D3) 25 MCG (1000 UNIT) tablet Take 2,000 Units by mouth daily.     Lactobacillus-Inulin (CULTURELLE ADULT ULT BALANCE PO) Take by mouth daily.     letrozole (FEMARA) 2.5 MG tablet TAKE 1 TABLET(2.5 MG) BY MOUTH DAILY 90 tablet 3   loratadine (CLARITIN) 10 MG tablet Take 10 mg by mouth daily.     mometasone (ELOCON) 0.1 % cream Apply 1 Application topically at bedtime.     Multiple Vitamins-Minerals (HAIR/SKIN/NAILS/BIOTIN PO) Take 1 capsule by mouth daily.      Neratinib Maleate (NERLYNX) 40 MG tablet Take 6 tablets (240 mg total) by mouth daily. Take with food. 168 tablet 10   ondansetron (ZOFRAN) 8 MG tablet Take 1 tablet (8 mg total) by mouth every 8 (eight) hours as needed for nausea or vomiting. 30 tablet 2   Polyethyl Glycol-Propyl Glycol (SYSTANE OP) Place 1 drop into both eyes daily as needed (dry eyes).     prochlorperazine (COMPAZINE) 10 MG tablet Take 1 tablet (10 mg total) by mouth every 6 (six) hours as needed for nausea or vomiting. 30 tablet 2   Thymol CRYS by Does not apply route. Apply 1-2 drops to nailbed 2 times a day     triamcinolone ointment (KENALOG) 0.1 % SMARTSIG:sparingly Topical Twice Daily     No current facility-administered medications for this visit.    Drug-Drug Interactions (DDIs) DDIs were evaluated? Yes Significant DDIs? Yes , she separates neratinib from her calcium products by the recommended time which has been discussed in the past The patient was instructed to speak with their health care provider and/or the  oral chemotherapy pharmacist before starting any new drug, including prescription or over the counter, natural / herbal products, or vitamins.  Supportive Care She continues to take loperamide as needed  Dosing Assessment = not assessed today (virtual visit) Hepatic adjustments needed?  N/A Renal adjustments needed?  N/A Toxicity adjustments needed? No  The current dosing regimen is appropriate to continue at this time.  Follow-Up Plan Continue neratinib 5 tablets (200 mg) daily with food.  Ms. Gosdin will likely reduce her dose of neratinib to 3 tablets daily while on vacation.  We felt this to be reasonable. Continue letrozole 2.5 mg daily Continue loperamide as needed for diarrhea Continue to follow with urology for chronic UTI management.  She will also see a Urogynecology specialist on 09/03/22. Labs, [name] visit, on 06/28/22 Labs, pharmacy clinic visit, on 08/27/22  Teresa Sheppard  participated in the discussion, expressed understanding, and voiced agreement with the above plan. All questions were answered to her satisfaction. The patient was advised to contact the clinic at [phone] with any questions or concerns prior to her return visit.   I spent 30 minutes assessing and educating the patient.  Raina Mina, RPH-CPP, 06/07/2022  11:40 AM   **Disclaimer: This note was dictated with voice recognition software. Similar sounding words can inadvertently be transcribed and this note may contain transcription errors which may not have been corrected upon publication of note.**

## 2022-06-11 ENCOUNTER — Encounter: Payer: Self-pay | Admitting: Physical Therapy

## 2022-06-11 ENCOUNTER — Ambulatory Visit: Payer: BC Managed Care – PPO | Admitting: Physical Therapy

## 2022-06-11 ENCOUNTER — Other Ambulatory Visit: Payer: Self-pay

## 2022-06-11 DIAGNOSIS — M62838 Other muscle spasm: Secondary | ICD-10-CM

## 2022-06-11 DIAGNOSIS — M6281 Muscle weakness (generalized): Secondary | ICD-10-CM

## 2022-06-11 DIAGNOSIS — C50811 Malignant neoplasm of overlapping sites of right female breast: Secondary | ICD-10-CM | POA: Diagnosis not present

## 2022-06-11 DIAGNOSIS — Z170 Estrogen receptor positive status [ER+]: Secondary | ICD-10-CM | POA: Diagnosis not present

## 2022-06-11 NOTE — Therapy (Signed)
OUTPATIENT PHYSICAL THERAPY TREATMENT NOTE   Patient Name: Teresa Sheppard MRN: [id number] DOB:[birth year], 61 y.o., female Today's Date: 04/12/2022  PCP: Wenda Low, MD REFERRING PROVIDER:  Nicholas Lose, MD    END OF SESSION:   PT End of Session - 06/11/22 1824     Visit Number 14    Date for PT Re-Evaluation 07/05/22    Authorization Type BCBS    PT Start Time 1452    PT Stop Time 8546    PT Time Calculation (min) 43 min    Activity Tolerance Patient tolerated treatment well    Behavior During Therapy WFL for tasks assessed/performed                         Past Medical History:  Diagnosis Date   Anxiety    Breast cancer in female [facility])    Right   Family history of breast cancer 11/07/2020   Family history of pancreatic cancer 11/07/2020   History of kidney stones 2014-2015   Hyperthyroidism    No longer an issue   PONV (postoperative nausea and vomiting)    Thyromegaly    Past Surgical History:  Procedure Laterality Date   ABDOMINAL WALL DEFECT REPAIR     following hernia repair in 2010   [place]     2008  while pregnant with last child. Scar tissue from appendectomy wrapped around bowel and was snipped to free bowel.    CESAREAN SECTION     DILATION AND CURETTAGE OF UTERUS     2000 and 2014   HERNIA REPAIR     MASTECTOMY W/ SENTINEL NODE BIOPSY Right 11/28/2020   Procedure: RIGHT MASTECTOMY WITH RIGHT AXILLARY SENTINEL LYMPH NODE BIOPSY;  Surgeon: [name], MD;  Location: [place];  Service: General;  Laterality: Right;   PORT-A-CATH REMOVAL Left 01/30/2021   Procedure: PORT REMOVAL;  Surgeon: [name], MD;  Location: [place];  Service: General;  Laterality: Left;  LOCAL   PORTACATH PLACEMENT Left 07/12/2020   Procedure: INSERTION PORT-A-CATH WITH ULTRASOUND GUIDANCE;  Surgeon: [name], MD;  Location: [place];  Service: General;  Laterality: Left;   RADIOACTIVE SEED GUIDED AXILLARY SENTINEL  LYMPH NODE Right 11/28/2020   Procedure: RADIOACTIVE SEED GUIDED RIGHT AXILLARY AXILLARY SENTINEL LYMPH NODE EXCISION;  Surgeon: [name], MD;  Location: [place];  Service: General;  Laterality: Right;   RADIOACTIVE SEED GUIDED EXCISIONAL BREAST BIOPSY Left 11/28/2020   Procedure: RADIOACTIVE SEED GUIDED LEFT EXCISIONAL BREAST BIOPSY;  Surgeon: [name], MD;  Location: [place];  Service: General;  Laterality: Left;   ROOT CANAL     2019   TONSILLECTOMY  1969   URETERAL EXPLORATION     dilataion for congenitally small ureter   Patient Active Problem List   Diagnosis Date Noted   Chemotherapy-induced peripheral neuropathy ([place]) 10/24/2021   Vitamin D deficiency 08/30/2021   Osteoporosis 08/30/2021   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   Genetic testing 11/15/2020   Family history of breast cancer 11/07/2020   Family history of pancreatic cancer 11/07/2020   Dysphagia 10/26/2020   Malignant neoplasm of overlapping sites of right breast in female, estrogen receptor positive ([place]) 06/21/2020   Hyperthyroidism 03/28/2016    REFERRING DIAG: C50.811,Z17.0 (ICD-10-CM) - Malignant neoplasm of overlapping sites of right breast in female, estrogen receptor positive ([place])  THERAPY DIAG:  Muscle weakness (generalized)  Other muscle spasm  Rationale for Evaluation and Treatment Rehabilitation  PERTINENT HISTORY: Hernia repair 2010, appendectomy, bowel resection, c-section, breast cancer, chronic UTI  PRECAUTIONS: None  SUBJECTIVE:  06/11/22 Pt has bladder diary.  She has had about the same amount of nocturia and some good days, some bad with bladder irritation.  05/23/2022: She is still waking up 4 times at night. She feels like it is effecting her sleep. She got treated for a UTI last week. She is not having the burning as much anymore with urination . She was able to do the internal massage this morning. Her gynecologist recommended her to try olive oil instead of the  vitamin E oil or coconut oil. The core exercise are going well but she wants to find a better balance between stretching and exercise.    PAIN:  Are you having pain? No but has some discomfort in the pelvic floor region OBJECTIVE: (objective measures completed at initial evaluation unless otherwise dated)  PATIENT SURVEYS:    PFIQ-7 POPIQ = 19 (eval); POPIQ = 10 (02/15/22)   COGNITION:            Overall cognitive status: Within functional limits for tasks assessed                              MUSCLE LENGTH: Hamstrings: Right 80 deg; Left 70 deg     LUMBAR SPECIAL TESTS:  ASLR - harder on left but a little easier with compression   FUNCTIONAL TESTS:    SLS trendelenburg Rt side; unsteady bil GAIT:   Comments: WFL   POSTURE:  Rt hip elevated in standing   LUMBARAROM/PROM   A/PROM A/PROM  10/31/2021  Flexion 75%  Extension    Right lateral flexion    Left lateral flexion    Right rotation    Left rotation     (Blank rows = not tested)   LE ROM:   Passive ROM Right 10/31/2021 Left 10/31/2021  Hip flexion 80% 90%  Hip extension      Hip abduction      Hip adduction      Hip internal rotation 50% 75%  Hip external rotation 50% 75%  Knee flexion      Knee extension      Ankle dorsiflexion      Ankle plantarflexion      Ankle inversion      Ankle eversion       (Blank rows = not tested)   LE MMT:   MMT Right 10/31/2021 Left 10/31/2021  Hip flexion      Hip extension      Hip abduction      Hip adduction      Hip internal rotation      Hip external rotation      Knee flexion      Knee extension      Ankle dorsiflexion      Ankle plantarflexion      Ankle inversion      Ankle eversion        PELVIC MMT:   MMT   10/31/2021  Vaginal 2/5 (weak squeeze, definite lift)  Internal Anal Sphincter    External Anal Sphincter    Puborectalis    Diastasis Recti    (Blank rows = not tested)         PALPATION:   General  tension on the Rt flank and slight  side bend in standing  External Perineal Exam descending perineal body                             Internal Pelvic Floor decreased closure and TTP Lt side more than Rt; difficulty holding contraction and fatigues quickly with reps; stiff muscles   TONE: high   PROLAPSE: None observed - did not do bearing down   03/15/22 - observed bearing down with minimal anterior wall prolapse; observed in standing and no prolapse noted  TODAY'S TREATMENT  Date: 06/11/22  Neuro re-ed - biofeedback: (1500-9m) Pelvic floor rocking  Quadruped circles Prone press up (down to 12 mV) Knee hugs with lumbar rotation Therapeutic Activities: Bladder journal  review - went over caffeine  Date: 06/04/22  Exercises: Pelvic floor rocking and circles in qped with coccyx mobs Side bend in qped with coccyx mobs Knee hugs with lumbar rotation Therapeutic Activities: Bladder irritants and bladder journal  - information provided and reviewed to ensure pt understands how and why to use   Date: 05/02/22   NeuroReED Half kneel and standing with pallof press - educated on posture during the exercises Manual Patient confirms identification and approves physical therapist to perform internal soft tissue work  - vaginal canal stretch at level of bladder        PATIENT EDUCATION:  Education details: sample given; Access Code: [email or web address] Person educated: Patient Education method: Explanation Education comprehension: verbalized understanding     HOME EXERCISE PROGRAM: Access Code: [email or web address]: [email or web address] Date: 04/23/2022 Prepared by: JJari FavreExercises - Child's Pose Stretch - 1 x daily - 7 x weekly - 1 sets - 3 reps - 30-60 hold - Diaphragmatic Breathing in Supported Child's Pose with Pelvic Floor Relaxation - 1 x daily - 7 x weekly - 3 sets - 10 reps - Standing 'L' Stretch at Counter - 1 x daily - 7 x weekly - 3 sets - 10 reps - Rock - 1 x daily - 7 x  weekly - 3 sets - 10 reps - Quadruped Transversus Abdominis Bracing - 1 x daily - 7 x weekly - 2 sets - 10 reps - 5 sec hold - Supine Double Knee to Chest - 1 x daily - 7 x weekly - 1 sets - 3 reps - 30 sec hold  ASSESSMENT:   CLINICAL IMPRESSION: Pt has maintain some progress but still having the nocturia.  At this time she is not expected to progress more with skilled PT.  She does have tools that she has been able to use to improve symptoms.  At this time she will d/c with HEP   OBJECTIVE IMPAIRMENTS decreased coordination, decreased endurance, decreased ROM, decreased strength, postural dysfunction, and pain.    ACTIVITY LIMITATIONS community activity and interpersonal relationships .    PERSONAL FACTORS 1-2 comorbidities: history of breast cancer, radiation, chemo, anti-estrogen medicine  are also affecting patient's functional outcome.      REHAB POTENTIAL: Excellent   CLINICAL DECISION MAKING: Evolving/moderate complexity   EVALUATION COMPLEXITY: Moderate     GOALS: Goals reviewed with patient? Yes   SHORT TERM GOALS: Target date: 11/28/2021   Ind with initial HEP Baseline: 11/27/21  Goal status: met 12/05/21  2.  Ind with moisturizer for vaginal canal and has found one that feels good for reducing chaffing. Baseline: 01/03/22  Goal status: met       LONG TERM GOALS: Target date: 07/05/2022  updated 05/23/2022      Pt will  be independent with advanced HEP to maintain improvements made throughout therapy   Baseline:  Goal status: MET   2.  Pt will report 75% reduction of pain due to improvements in posture, strength, and muscle length   Baseline: not having pain Goal status: Met   3.  Pt will have POPIQ score improved by least 5 points. Baseline: 10 - improved by 9 Goal status: MET   4.  Pt will have nocturia decreased to 1-2/night at most   Baseline: she states she is still going on average 4 times at night  Goal status: NOT met   5.  Pt will not have  pain with voiding due to improved skin integrity and muscle tone Baseline:  Goal status: met 6.  Pt will be able to do all functional exercises without leakage Baseline: rarely getting leakage and is exercising Goal status: MET        PLAN: PT FREQUENCY: 1x/week   PT DURATION: 12 weeks   PLANNED INTERVENTIONS: Therapeutic exercises, Therapeutic activity, Neuromuscular re-education, Balance training, Gait training, Patient/Family education, Joint mobilization, Dry Needling, Electrical stimulation, Spinal mobilization, Cryotherapy, Moist heat, Taping, Biofeedback, and Manual therapy   PLAN FOR NEXT SESSION: d/c today  Gustavus Bryant, PT 06/11/22 6:25 PM  PHYSICAL THERAPY DISCHARGE SUMMARY  Visits from Start of Care: 14  Current functional level related to goals / functional outcomes: See above goals   Remaining deficits: See above   Education / Equipment: HEP   Patient agrees to discharge. Patient goals were partially met. Patient is being discharged due to lack of progress.  Gustavus Bryant, PT 06/11/22 6:55 PM

## 2022-06-12 ENCOUNTER — Other Ambulatory Visit (HOSPITAL_COMMUNITY): Payer: Self-pay

## 2022-06-12 DIAGNOSIS — N952 Postmenopausal atrophic vaginitis: Secondary | ICD-10-CM | POA: Diagnosis not present

## 2022-06-12 DIAGNOSIS — N390 Urinary tract infection, site not specified: Secondary | ICD-10-CM | POA: Diagnosis not present

## 2022-06-12 DIAGNOSIS — R399 Unspecified symptoms and signs involving the genitourinary system: Secondary | ICD-10-CM | POA: Diagnosis not present

## 2022-06-15 ENCOUNTER — Encounter: Payer: Self-pay | Admitting: Hematology and Oncology

## 2022-06-20 ENCOUNTER — Telehealth: Payer: Self-pay | Admitting: Hematology and Oncology

## 2022-06-20 NOTE — Telephone Encounter (Signed)
Rescheduled appointment per provider on-call. Patient is aware of the changes made to her upcoming appointments.

## 2022-06-22 ENCOUNTER — Telehealth: Payer: Self-pay

## 2022-06-22 ENCOUNTER — Encounter: Payer: Self-pay | Admitting: Hematology and Oncology

## 2022-06-22 DIAGNOSIS — N390 Urinary tract infection, site not specified: Secondary | ICD-10-CM | POA: Diagnosis not present

## 2022-06-22 DIAGNOSIS — R3911 Hesitancy of micturition: Secondary | ICD-10-CM | POA: Diagnosis not present

## 2022-06-22 NOTE — Telephone Encounter (Signed)
Teresa Sheppard called because she has not taken her Nerlynx for 7 days due to a UTI.  She thought it was resolved but went back to PCP today and they did another urine culture but it will likely not be back until 06/26/22.  PCP prescribed Septra to her today.  She said she does not know what is going on because sometimes she has stinging and burning on urination but other times not.  He main concern is should she restart her Nerlynx since it has now been 7 days off of it.  Sending to [place] for advice.  Gardiner Rhyme, RN

## 2022-06-22 NOTE — Telephone Encounter (Signed)
I would recommend her UTI having time to clear and to please have her PCP fax or send [place] lab reports from her urinalyses and cultures.    I would restart on Tuesday, 06/26/2022.  Thanks, [place]

## 2022-06-22 NOTE — Telephone Encounter (Signed)
Called Mrs. Schrade back to let her know to restart the Nerlynx on 06/26/22.  Advised her we did not want her to get septic and it was fine to be off the Nerlynx until then.  Also instructed her to have the urine culture results sent to [place] from her PCP. Gardiner Rhyme, RN

## 2022-06-25 NOTE — Progress Notes (Signed)
Patient Care Team: Wenda Low, MD as PCP - General (Internal Medicine) [name], MD as Consulting Physician (General Surgery) Nicholas Lose, MD as Medical Oncologist (Hematology and Oncology) Kyung Rudd, MD as Consulting Physician (Radiation Oncology) Jacelyn Pi, MD as Referring Physician (Endocrinology) Lona Millard, MD as Referring Physician (Endocrinology) Arta Silence, MD as Consulting Physician (Gastroenterology) Raina Mina, RPH-CPP as Pharmacist (Hematology and Oncology)  DIAGNOSIS: No diagnosis found.  SUMMARY OF ONCOLOGIC HISTORY: Oncology History  Malignant neoplasm of overlapping sites of right breast in female, estrogen receptor positive ([place])  06/21/2020 Initial Diagnosis   Patient palpated a right breast mass x 1 wk. Mammogram showed multiple confluent masses in the right breast at the 7 o'clock position measuring 2.3cm and at the 9 o'clock position measuring 2.2cm with one right axillary lymph node with cortical thickening. Biopsy showed invasive mammary carcinoma at both positions and in the axilla, grade 2-3, HER-2 equivocal by IHC (2+), positive by FISH (ratio 3.34), ER+ >95%, PR+ 25%, Ki67 60%.   06/29/2020 Cancer Staging   Staging form: Breast, AJCC 8th Edition - Clinical stage from 06/29/2020: Stage IB (cT2, cN1(f), cM0, G3, ER+, PR+, HER2+) - Signed by Nicholas Lose, MD on 06/29/2020   07/13/2020 - 11/16/2020 Neo-Adjuvant Chemotherapy   Taxotere, Carbo, Herceptin, Perjeta x 6 followed by 1 cycle of herceptin/perjeta   10/28/2020 Breast MRI   Complete imaging response   11/14/2020 Genetic Testing   Negative hereditary cancer genetic testing: no pathogenic variants detected in [employer] BRCAPlus Panel and [employer] CancerNext-Expanded +RNAinsight Panel.  The report dates are Nov 14, 2020 and December 05, 2020, respectively.    The BRCAplus panel offered by [employer] and includes sequencing and deletion/duplication analysis for the following 8 genes:  ATM, BRCA1, BRCA2, CDH1, CHEK2, PALB2, PTEN, and TP53.  The CancerNext-Expanded gene panel offered by [facility] and includes sequencing, rearrangement, and RNA analysis for the following 77 genes: AIP, ALK, APC, ATM, AXIN2, BAP1, BARD1, BLM, BMPR1A, BRCA1, BRCA2, BRIP1, CDC73, CDH1, CDK4, CDKN1B, CDKN2A, CHEK2, CTNNA1, DICER1, FANCC, FH, FLCN, GALNT12, KIF1B, LZTR1, MAX, MEN1, MET, MLH1, MSH2, MSH3, MSH6, MUTYH, NBN, NF1, NF2, NTHL1, PALB2, PHOX2B, PMS2, POT1, PRKAR1A, PTCH1, PTEN, RAD51C, RAD51D, RB1, RECQL, RET, SDHA, SDHAF2, SDHB, SDHC, SDHD, SMAD4, SMARCA4, SMARCB1, SMARCE1, STK11, SUFU, TMEM127, TP53, TSC1, TSC2, VHL and XRCC2 (sequencing and deletion/duplication); EGFR, EGLN1, HOXB13, KIT, MITF, PDGFRA, POLD1, and POLE (sequencing only); EPCAM and GREM1 (deletion/duplication only).    11/28/2020 Surgery   Right mastectomy [name]): IDC, grade 3, spanning 4.0cm fibrotic area, clear margins, 1/4 right axillary lymph nodes positive for carcinoma, 0.5cm.   Left lumpectomy: no evidence of malignancy   11/28/2020 Cancer Staging   Staging form: Breast, AJCC 8th Edition - Pathologic stage from 11/28/2020: No Stage Recommended (ypT2, pN1a, cM0) - Signed by Gardenia Phlegm, NP on 07/26/2021 Stage prefix: Post-therapy   12/07/2020 - 05/23/2021 Chemotherapy   Patient is on Treatment Plan : BREAST ADO-Trastuzumab Emtansine (Kadcyla) q21d     01/04/2021 - 02/17/2021 Radiation Therapy   Site Technique Total Dose (Gy) Dose per Fx (Gy) Completed Fx Beam Energies  Chest Wall, Right: CW_Rt 3D 50.4/50.4 1.8 28/28 6X  Chest Wall, Right: CW_Rt_SCLV 3D 50.4/50.4 1.8 28/28 6X, 10X  Chest Wall, Right: CW_Rt_Bst Electron 10/10 2 5/5 6E     12/2020 -  Anti-estrogen oral therapy   Letrozole daily     CHIEF COMPLIANT:  Follow-up right breast cancer on Nertinib and letrozole   INTERVAL HISTORY: Teresa Sheppard is  a 62 y.o with the above-mentioned right breast cancer on Nertinib and letrozole  . She  presents to the clinic for today for a follow-up.    ALLERGIES:  is allergic to shellfish-derived products, ciprofibrate, oxycodone hcl, shrimp extract allergy skin test, hydrocodone, and penicillins.  MEDICATIONS:  Current Outpatient Medications  Medication Sig Dispense Refill   acetaminophen (TYLENOL) 325 MG tablet Take 325 mg by mouth every 6 (six) hours as needed (headaches / pain).     Calcium-Vitamin D-Vitamin K ([employer] CALCIUM SOFT CHEWS) 650-12.5-40 MG-MCG-MCG CHEW Chew 1 tablet by mouth 2 (two) times daily.     cholecalciferol (VITAMIN D3) 25 MCG (1000 UNIT) tablet Take 2,000 Units by mouth daily.     Lactobacillus-Inulin (CULTURELLE ADULT ULT BALANCE PO) Take by mouth daily.     letrozole (FEMARA) 2.5 MG tablet TAKE 1 TABLET(2.5 MG) BY MOUTH DAILY 90 tablet 3   loratadine (CLARITIN) 10 MG tablet Take 10 mg by mouth daily.     mometasone (ELOCON) 0.1 % cream Apply 1 Application topically at bedtime.     Multiple Vitamins-Minerals (HAIR/SKIN/NAILS/BIOTIN PO) Take 1 capsule by mouth daily.     Neratinib Maleate (NERLYNX) 40 MG tablet Take 6 tablets (240 mg total) by mouth daily. Take with food. 168 tablet 10   ondansetron (ZOFRAN) 8 MG tablet Take 1 tablet (8 mg total) by mouth every 8 (eight) hours as needed for nausea or vomiting. 30 tablet 2   Polyethyl Glycol-Propyl Glycol (SYSTANE OP) Place 1 drop into both eyes daily as needed (dry eyes).     prochlorperazine (COMPAZINE) 10 MG tablet Take 1 tablet (10 mg total) by mouth every 6 (six) hours as needed for nausea or vomiting. 30 tablet 2   Thymol CRYS by Does not apply route. Apply 1-2 drops to nailbed 2 times a day     triamcinolone ointment (KENALOG) 0.1 % SMARTSIG:sparingly Topical Twice Daily     No current facility-administered medications for this visit.    PHYSICAL EXAMINATION: ECOG PERFORMANCE STATUS: {CHL ONC ECOG [phone]}  There were no vitals filed for this visit. There were no vitals filed for this  visit.  BREAST:*** No palpable masses or nodules in either right or left breasts. No palpable axillary supraclavicular or infraclavicular adenopathy no breast tenderness or nipple discharge. (exam performed in the presence of a chaperone)  LABORATORY DATA:  I have reviewed the data as listed    Latest Ref Rng & Units 04/26/2022    2:36 PM 03/29/2022    2:54 PM 02/27/2022    1:52 PM  CMP  Glucose 70 - 99 mg/dL 75  94  95   BUN 6 - 20 mg/dL _0 Creatinine 0.44 - 1.00 mg/dL 0.89  0.93  0.95   Sodium 135 - 145 mmol/L 141  136  140   Potassium 3.5 - 5.1 mmol/L 4.4  4.1  4.1   Chloride 98 - 111 mmol/L 105  102  106   CO2 22 - 32 mmol/L 32  30  30   Calcium 8.9 - 10.3 mg/dL 9.9  9.2  9.6   Total Protein 6.5 - 8.1 g/dL 7.7  7.4  7.0   Total Bilirubin 0.3 - 1.2 mg/dL 0.5  0.4  0.4   Alkaline Phos 38 - 126 U/L 118  111  111   AST 15 - 41 U/L 37  37  39   ALT 0 - 44 U/L 28  26  29  Lab Results  Component Value Date   WBC 5.2 04/26/2022   HGB 12.6 04/26/2022   HCT 37.6 04/26/2022   MCV 95.4 04/26/2022   PLT 166 04/26/2022   NEUTROABS 3.4 04/26/2022    ASSESSMENT & PLAN:  No problem-specific Assessment & Plan notes found for this encounter.    No orders of the defined types were placed in this encounter.  The patient has a good understanding of the overall plan. she agrees with it. she will call with any problems that may develop before the next visit here. Total time spent: 30 mins including face to face time and time spent for planning, charting and co-ordination of care   Suzzette Righter, [place] 06/25/22    I Gardiner Coins am acting as a Education administrator for [employer]  ***

## 2022-06-26 ENCOUNTER — Telehealth: Payer: Self-pay

## 2022-06-26 ENCOUNTER — Other Ambulatory Visit (HOSPITAL_COMMUNITY): Payer: Self-pay

## 2022-06-26 NOTE — Telephone Encounter (Signed)
Pt called and states she was prescribed Macrobid as the bacteria from UTI is sensitive to this. Per Gilford Rile, pharmD, she should stop Nerlynx until completion of abx. Pt is in agreement with this and sees MD 1/4 and knows MD will address any further questions at that visit.

## 2022-06-27 ENCOUNTER — Other Ambulatory Visit (HOSPITAL_COMMUNITY): Payer: Self-pay

## 2022-06-28 ENCOUNTER — Other Ambulatory Visit: Payer: Self-pay | Admitting: *Deleted

## 2022-06-28 ENCOUNTER — Other Ambulatory Visit (HOSPITAL_COMMUNITY): Payer: Self-pay

## 2022-06-28 ENCOUNTER — Other Ambulatory Visit: Payer: Self-pay

## 2022-06-28 ENCOUNTER — Other Ambulatory Visit: Payer: BC Managed Care – PPO

## 2022-06-28 ENCOUNTER — Ambulatory Visit: Payer: BC Managed Care – PPO | Admitting: Pharmacist

## 2022-06-28 ENCOUNTER — Ambulatory Visit: Payer: BC Managed Care – PPO | Admitting: Hematology and Oncology

## 2022-06-28 ENCOUNTER — Inpatient Hospital Stay: Payer: BC Managed Care – PPO | Attending: Hematology and Oncology

## 2022-06-28 ENCOUNTER — Inpatient Hospital Stay: Payer: BC Managed Care – PPO | Admitting: Hematology and Oncology

## 2022-06-28 ENCOUNTER — Encounter: Payer: Self-pay | Admitting: Hematology and Oncology

## 2022-06-28 VITALS — BP 120/59 | HR 52 | Temp 98.5°F | Wt 150.0 lb

## 2022-06-28 DIAGNOSIS — C50811 Malignant neoplasm of overlapping sites of right female breast: Secondary | ICD-10-CM | POA: Diagnosis not present

## 2022-06-28 DIAGNOSIS — Z170 Estrogen receptor positive status [ER+]: Secondary | ICD-10-CM | POA: Diagnosis not present

## 2022-06-28 DIAGNOSIS — Z79811 Long term (current) use of aromatase inhibitors: Secondary | ICD-10-CM | POA: Diagnosis not present

## 2022-06-28 LAB — CMP (CANCER CENTER ONLY)
ALT: 30 U/L (ref 0–44)
AST: 37 U/L (ref 15–41)
Albumin: 4.5 g/dL (ref 3.5–5.0)
Alkaline Phosphatase: 109 U/L (ref 38–126)
Anion gap: 5 (ref 5–15)
BUN: 14 mg/dL (ref 8–23)
CO2: 29 mmol/L (ref 22–32)
Calcium: 9.7 mg/dL (ref 8.9–10.3)
Chloride: 104 mmol/L (ref 98–111)
Creatinine: 0.78 mg/dL (ref 0.44–1.00)
GFR, Estimated: >60 mL/min (ref >60)
Glucose, Bld: 84 mg/dL (ref 70–99)
Potassium: 4.1 mmol/L (ref 3.5–5.1)
Sodium: 138 mmol/L (ref 135–145)
Total Bilirubin: 0.5 mg/dL (ref 0.3–1.2)
Total Protein: 7.3 g/dL (ref 6.5–8.1)

## 2022-06-28 LAB — CBC WITH DIFFERENTIAL (CANCER CENTER ONLY)
Abs Immature Granulocytes: 0.01 10*3/uL (ref 0.00–0.07)
Basophils Absolute: 0.1 10*3/uL (ref 0.0–0.1)
Basophils Relative: 2 %
Eosinophils Absolute: 0.2 10*3/uL (ref 0.0–0.5)
Eosinophils Relative: 4 %
HCT: 38.6 % (ref 36.0–46.0)
Hemoglobin: 12.9 g/dL (ref 12.0–15.0)
Immature Granulocytes: 0 %
Lymphocytes Relative: 24 %
Lymphs Abs: 0.9 10*3/uL (ref 0.7–4.0)
MCH: 32.3 pg (ref 26.0–34.0)
MCHC: 33.4 g/dL (ref 30.0–36.0)
MCV: 96.5 fL (ref 80.0–100.0)
Monocytes Absolute: 0.5 10*3/uL (ref 0.1–1.0)
Monocytes Relative: 13 %
Neutro Abs: 2.3 10*3/uL (ref 1.7–7.7)
Neutrophils Relative %: 57 %
Platelet Count: 176 10*3/uL (ref 150–400)
RBC: 4 MIL/uL (ref 3.87–5.11)
RDW: 12 % (ref 11.5–15.5)
WBC Count: 4 10*3/uL (ref 4.0–10.5)
nRBC: 0 % (ref 0.0–0.2)

## 2022-06-28 LAB — MAGNESIUM: Magnesium: 1.9 mg/dL (ref 1.7–2.4)

## 2022-06-28 MED ORDER — ESTRADIOL 0.1 MG/GM VA CREA: 1.0000 | TOPICAL_CREAM | Freq: Every day | VAGINAL | 12 refills | Status: DC

## 2022-06-28 NOTE — Assessment & Plan Note (Signed)
06/21/2020:Patient palpated a right breast mass x 1 wk. Mammogram showed multiple confluent masses in the right breast at the 7 o'clock position measuring 2.3cm and at the 9 o'clock position measuring 2.2cm with one right axillary lymph node with cortical thickening. Biopsy showed invasive mammary carcinoma at both positions and in the axilla, grade 2-3, HER-2 equivocal by IHC (2+), positive by FISH (ratio 3.34), ER+ >95%, PR+ 25%, Ki67 60%.   Treatment plan: 1. Neoadjuvant chemotherapy with TCH Perjeta 6 cycles completed 10/26/2020 followed by Herceptin Perjeta versus [name] for 1 year completed 05/23/2021 2.  Right mastectomy Teresa Sheppard): IDC, grade 3, spanning 4.0cm fibrotic area, clear margins, 1/4 right axillary lymph nodes positive for carcinoma, 0.5cm. Left lumpectomy: no evidence of malignancy 3. Followed by adjuvant radiation therapy completed 02/17/2021 4.  Followed by adjuvant antiestrogen therapy started 12/29/2020 5.  Followed by neratinib (started 12/28/2021) ---------------------------------------------------------------------------------------------------------------------------------- Current Treatment:letrozole  COVID infection: August 2023: Treated with antiviral medication. (Molnupiravir) Letrozole toxicities: Tolerating it extremely well.    Surveillance:  1. Mammograms June 08, 2021 at [facility]: Benign Bone density: November 2022: T score -2.4:  2. Breast MRI: 11/17/2021: Benign breast density category C   Current Treatment: Neratinib held for COVID-19 infection: Restarted neratinib on 01/30/2022. Neratinib toxicities: 1.  Diarrhea is manageable when she takes 5 tablets a day.  I instructed her to go down to 5 tablets a day.  This will be her maintenance dose. 2. mild dizziness: Probably orthostatic in nature. 3.  Mild fatigue   Return to clinic every 2 months with labs and follow-ups

## 2022-06-29 DIAGNOSIS — R300 Dysuria: Secondary | ICD-10-CM | POA: Diagnosis not present

## 2022-06-29 DIAGNOSIS — R351 Nocturia: Secondary | ICD-10-CM | POA: Diagnosis not present

## 2022-07-02 ENCOUNTER — Other Ambulatory Visit: Payer: Self-pay

## 2022-07-06 ENCOUNTER — Encounter: Payer: Self-pay | Admitting: Hematology and Oncology

## 2022-07-11 ENCOUNTER — Encounter: Payer: Self-pay | Admitting: Hematology and Oncology

## 2022-07-11 NOTE — Telephone Encounter (Signed)
Hi John, would you be able to help me answer this question?

## 2022-07-16 NOTE — Progress Notes (Signed)
Teresa Sheppard, counseling intern, met patient for their scheduled counseling session.   The patient expressed relief because they were given a new treatment plan for their persistent UTI symptoms that seems to be working.   The patient shared joyous reflections from their holiday vacation with their family. The patient shared they have a string of birthdays over the holiday season..The patient shared she is looking forward to resting after this celebration.   The patient reflected, overall, they are feeling less anxious and more balanced.   The patient scheduled their next counseling session for 2/15 at 1:30pm.   Teresa Sheppard,  Counseling Intern  [phone] [email or web address]

## 2022-07-17 ENCOUNTER — Telehealth: Payer: Self-pay | Admitting: Pharmacist

## 2022-07-17 NOTE — Telephone Encounter (Signed)
Patient called to r/s appointment to earlier date due to ravels. Patient r/s and notified.

## 2022-07-18 ENCOUNTER — Ambulatory Visit: Payer: BC Managed Care – PPO | Admitting: Hematology and Oncology

## 2022-07-18 ENCOUNTER — Other Ambulatory Visit (HOSPITAL_COMMUNITY): Payer: Self-pay

## 2022-07-20 ENCOUNTER — Other Ambulatory Visit (HOSPITAL_COMMUNITY): Payer: Self-pay

## 2022-07-23 ENCOUNTER — Other Ambulatory Visit (HOSPITAL_COMMUNITY): Payer: Self-pay

## 2022-07-23 ENCOUNTER — Other Ambulatory Visit: Payer: Self-pay

## 2022-07-26 DIAGNOSIS — R300 Dysuria: Secondary | ICD-10-CM | POA: Diagnosis not present

## 2022-07-26 DIAGNOSIS — R8271 Bacteriuria: Secondary | ICD-10-CM | POA: Diagnosis not present

## 2022-07-26 DIAGNOSIS — R351 Nocturia: Secondary | ICD-10-CM | POA: Diagnosis not present

## 2022-07-31 ENCOUNTER — Other Ambulatory Visit (HOSPITAL_COMMUNITY): Payer: Self-pay

## 2022-08-01 DIAGNOSIS — N952 Postmenopausal atrophic vaginitis: Secondary | ICD-10-CM | POA: Diagnosis not present

## 2022-08-01 DIAGNOSIS — L600 Ingrowing nail: Secondary | ICD-10-CM | POA: Diagnosis not present

## 2022-08-02 ENCOUNTER — Encounter: Payer: Self-pay | Admitting: Hematology and Oncology

## 2022-08-06 ENCOUNTER — Other Ambulatory Visit (HOSPITAL_COMMUNITY): Payer: Self-pay

## 2022-08-07 ENCOUNTER — Other Ambulatory Visit (HOSPITAL_COMMUNITY): Payer: Self-pay

## 2022-08-07 ENCOUNTER — Encounter: Payer: Self-pay | Admitting: Hematology and Oncology

## 2022-08-07 NOTE — Telephone Encounter (Signed)
Fax medical clearance form per md to [facility] and received successful confirmation.

## 2022-08-08 ENCOUNTER — Other Ambulatory Visit: Payer: Self-pay

## 2022-08-09 NOTE — Progress Notes (Signed)
Teresa Sheppard, counseling intern, met with patient today for their scheduled counseling session.   Patient reported they are feeling less anxious than they were when they entered counseling. The patient reported they have less and less doctors appointments and they are looking forward to starting work again and traveling abroad in [place].   The patient reflected they are more vigilant regarding body aches and pains which they feel is a symptom from the mammogram missing the cancer.   The patient scheduled their next counseling session for Thursday, Feb. 29th at 1:30pm.  Teresa Sheppard,  Counseling Intern  [phone] [email or web address]

## 2022-08-17 ENCOUNTER — Encounter: Payer: Self-pay | Admitting: Adult Health

## 2022-08-21 ENCOUNTER — Other Ambulatory Visit: Payer: Self-pay

## 2022-08-21 ENCOUNTER — Inpatient Hospital Stay: Payer: BC Managed Care – PPO | Attending: Hematology and Oncology

## 2022-08-21 ENCOUNTER — Inpatient Hospital Stay: Payer: BC Managed Care – PPO | Admitting: Pharmacist

## 2022-08-21 VITALS — BP 120/64 | HR 51 | Temp 97.5°F | Resp 18 | Ht 70.0 in | Wt 153.1 lb

## 2022-08-21 DIAGNOSIS — Z79811 Long term (current) use of aromatase inhibitors: Secondary | ICD-10-CM | POA: Insufficient documentation

## 2022-08-21 DIAGNOSIS — C50811 Malignant neoplasm of overlapping sites of right female breast: Secondary | ICD-10-CM

## 2022-08-21 DIAGNOSIS — Z170 Estrogen receptor positive status [ER+]: Secondary | ICD-10-CM | POA: Diagnosis not present

## 2022-08-21 LAB — CMP (CANCER CENTER ONLY)
ALT: 24 U/L (ref 0–44)
AST: 39 U/L (ref 15–41)
Albumin: 4.5 g/dL (ref 3.5–5.0)
Alkaline Phosphatase: 92 U/L (ref 38–126)
Anion gap: 6 (ref 5–15)
BUN: 14 mg/dL (ref 8–23)
CO2: 31 mmol/L (ref 22–32)
Calcium: 9.4 mg/dL (ref 8.9–10.3)
Chloride: 102 mmol/L (ref 98–111)
Creatinine: 0.87 mg/dL (ref 0.44–1.00)
GFR, Estimated: >60 mL/min (ref >60)
Glucose, Bld: 73 mg/dL (ref 70–99)
Potassium: 4.2 mmol/L (ref 3.5–5.1)
Sodium: 139 mmol/L (ref 135–145)
Total Bilirubin: 0.5 mg/dL (ref 0.3–1.2)
Total Protein: 7.9 g/dL (ref 6.5–8.1)

## 2022-08-21 LAB — CBC WITH DIFFERENTIAL/PLATELET
Abs Immature Granulocytes: 0.01 10*3/uL (ref 0.00–0.07)
Basophils Absolute: 0 10*3/uL (ref 0.0–0.1)
Basophils Relative: 1 %
Eosinophils Absolute: 0.2 10*3/uL (ref 0.0–0.5)
Eosinophils Relative: 4 %
HCT: 39.5 % (ref 36.0–46.0)
Hemoglobin: 13.1 g/dL (ref 12.0–15.0)
Immature Granulocytes: 0 %
Lymphocytes Relative: 33 %
Lymphs Abs: 1.3 10*3/uL (ref 0.7–4.0)
MCH: 31.1 pg (ref 26.0–34.0)
MCHC: 33.2 g/dL (ref 30.0–36.0)
MCV: 93.8 fL (ref 80.0–100.0)
Monocytes Absolute: 0.4 10*3/uL (ref 0.1–1.0)
Monocytes Relative: 11 %
Neutro Abs: 2 10*3/uL (ref 1.7–7.7)
Neutrophils Relative %: 51 %
Platelets: 164 10*3/uL (ref 150–400)
RBC: 4.21 MIL/uL (ref 3.87–5.11)
RDW: 11.9 % (ref 11.5–15.5)
WBC: 3.8 10*3/uL — ABNORMAL LOW (ref 4.0–10.5)
nRBC: 0 % (ref 0.0–0.2)

## 2022-08-21 LAB — MAGNESIUM: Magnesium: 2.1 mg/dL (ref 1.7–2.4)

## 2022-08-21 NOTE — Progress Notes (Signed)
Teresa Sheppard       Telephone: [phone]?Fax: [phone]   Oncology Clinical Pharmacist Practitioner Progress Note  [name] was contacted via in person visit to discuss her chemotherapy regimen for neratinib which they receive under the care of Dr. Nicholas Lose.    Current treatment regimen and start date Neratinib (12/28/21) Letrozole (12/29/20)   Interval History She continues on neratinib 5 tablets (200 mg) by mouth daily on days 1 to 28 of a 28-day cycle. This is being given in combination with letrozole. Therapy is planned to continue until 1 year in the extended adjuvant setting (12/28/22).  [name] was seen today by clinical pharmacy as a follow-up to her neratinib management.  She last saw [name] on 06/28/22 and had a telephone encounter with clinical pharmacy on 06/07/22 and was last seen on 11/2//23. [name] prefers her to stay at neratinib 5 tablets and to be seen with labs every 2 months.   Response to Therapy [name] continues to tolerate neratinib very well.  She is reporting occasional diarrhea but she feels it is more attributed to her diet more than anything.  [name] prefers that she stays on 5 tablets of neratinib going forward.  She states that she has plenty left of her current prescription and she has been having no issues obtaining this medication from [place].  We did update her medication list to include D-mannose supplement that was recommended by her physician at [facility].  She feels this is working very well to help prevent her chronic UTIs.  We also updated her estradiol cream which she believes she is taking 1g 2 times a week per urology as well.  She is also using a vitamin D oil and Luvena vaginal moisturizer which were both added.  She did inquire about a bilateral breast MRI which she believes should be done sometime in June.  We will notate her visit today that [name] may want to order  this when he next sees her in 8 weeks.  Clinical pharmacy will then see her again in 16 weeks.  She knows that she can contact Dr. Geralyn Flash clinic for any questions or concerns in the interim. Labs, vitals, treatment parameters, and manufacturer guidelines assessing toxicity were reviewed with [name] today. Based on these values, patient is in agreement to continue neratinib therapy at this time.  Allergies Allergies  Allergen Reactions   Shellfish-Derived Products Anaphylaxis    Per patient whenever she consumes a lot gives tightness of throat   Ciprofibrate Other (See Comments)    Joint pain    Oxycodone Hcl    Propylthiouracil Other (See Comments)   Shellfish Allergy Other (See Comments)   Shrimp Extract Allergy Skin Test    Hydrocodone Anxiety and Nausea Only   Penicillins Rash and Other (See Comments)    Vitals    08/21/2022    1:13 PM 06/28/2022   12:10 PM 04/26/2022    3:21 PM  Oncology Vitals  Height 178 cm  178 cm  Weight 69.446 kg 68.04 kg 68.312 kg  Weight (lbs) 153 lbs 2 oz 150 lbs 150 lbs 10 oz  BMI 21.97 kg/m2   21.97 kg/m2 21.52 kg/m2   21.52 kg/m2 21.61 kg/m2   21.61 kg/m2  Temp 97.5 F (36.4 C) 98.5 F (36.9 C) 98.1 F (36.7 C)  Pulse Rate 51 52 59  BP 120/64 120/59 116/66  Resp 18  18  SpO2 100 % 100 % 100 %  BSA (m2) 1.85 m2   1.85 m2 1.83 m2   1.83 m2 1.84 m2   1.84 m2    Laboratory Data    Latest Ref Rng & Units 08/21/2022   12:46 PM 06/28/2022   11:53 AM 04/26/2022    2:36 PM  CBC EXTENDED  WBC 4.0 - 10.5 K/uL 3.8  4.0  5.2   RBC 3.87 - 5.11 MIL/uL 4.21  4.00  3.94   Hemoglobin 12.0 - 15.0 g/dL 13.1  12.9  12.6   HCT 36.0 - 46.0 % 39.5  38.6  37.6   Platelets 150 - 400 K/uL 164  176  166   NEUT# 1.7 - 7.7 K/uL 2.0  2.3  3.4   Lymph# 0.7 - 4.0 K/uL 1.3  0.9  1.1        Latest Ref Rng & Units 08/21/2022   12:46 PM 06/28/2022   11:53 AM 04/26/2022    2:36 PM  CMP  Glucose 70 - 99 mg/dL 73  84  75   BUN 8 - 23 mg/dL '14  14  16    '$ Creatinine 0.44 - 1.00 mg/dL 0.87  0.78  0.89   Sodium 135 - 145 mmol/L 139  138  141   Potassium 3.5 - 5.1 mmol/L 4.2  4.1  4.4   Chloride 98 - 111 mmol/L 102  104  105   CO2 22 - 32 mmol/L 31  29  32   Calcium 8.9 - 10.3 mg/dL 9.4  9.7  9.9   Total Protein 6.5 - 8.1 g/dL 7.9  7.3  7.7   Total Bilirubin 0.3 - 1.2 mg/dL 0.5  0.5  0.5   Alkaline Phos 38 - 126 U/L 92  109  118   AST 15 - 41 U/L 39  37  37   ALT 0 - 44 U/L '24  30  28     '$ Lab Results  Component Value Date   MG 2.1 08/21/2022   MG 1.9 06/28/2022   MG 2.3 04/26/2022   No results found for: "CA2729"   Adverse Effects Assessment Loose stools: does very well controlling with diet and loperamide as needed. Stable.   Adherence Assessment Uniquia Glime Lamphier reports missing 0 doses over the past 4 weeks.   Reason for missed dose: n/a Patient was re-educated on importance of adherence.   Access Assessment Teresa Sheppard is currently receiving her neratinib through [facility] Treatment concerns:  none  Medication Reconciliation The patient's medication list was reviewed today with the patient? Yes New medications or herbal supplements have recently been started? Yes  Any medications have been discontinued? Yes , she is now using biotin skin/nail gummies. We updated this on her medication list The medication list was updated and reconciled based on the patient's most recent medication list in the electronic medical record (EMR) including herbal products and OTC medications.   Medications Current Outpatient Medications  Medication Sig Dispense Refill   Biotin w/ Vitamins C & E (HAIR SKIN & NAILS GUMMIES PO) Take by mouth.     D-Mannose 500 MG CAPS Take 500 mg by mouth in the morning and at bedtime.     LUVENA VAGINAL MOISTURIZER VA Place vaginally. Once a week     vitamin E 28000 units external oil Apply topically daily. Vaginally 2-3 times per week     acetaminophen (TYLENOL) 325 MG tablet Take 325  mg by mouth  every 6 (six) hours as needed (headaches / pain).     Calcium-Vitamin D-Vitamin K ([employer] CALCIUM SOFT CHEWS) 650-12.5-40 MG-MCG-MCG CHEW Chew 1 tablet by mouth 2 (two) times daily.     cholecalciferol (VITAMIN D3) 25 MCG (1000 UNIT) tablet Take 2,000 Units by mouth daily.     estradiol (ESTRACE VAGINAL) 0.1 MG/GM vaginal cream Place 1 Applicatorful vaginally at bedtime. (Patient taking differently: Place 1 Applicatorful vaginally at bedtime. Per urology, 1g  two times per week) 42.5 g 12   Lactobacillus-Inulin (CULTURELLE ADULT ULT BALANCE PO) Take by mouth daily.     letrozole (FEMARA) 2.5 MG tablet TAKE 1 TABLET(2.5 MG) BY MOUTH DAILY 90 tablet 3   Multiple Vitamins-Minerals (HAIR/SKIN/NAILS/BIOTIN PO) Take 1 capsule by mouth daily.     Neratinib Maleate (NERLYNX) 40 MG tablet Take 6 tablets (240 mg total) by mouth daily. Take with food. (Patient taking differently: Take 200 mg by mouth daily. Take with food.) 168 tablet 10   Polyethyl Glycol-Propyl Glycol (SYSTANE OP) Place 1 drop into both eyes daily as needed (dry eyes).     No current facility-administered medications for this visit.    Drug-Drug Interactions (DDIs) DDIs were evaluated? Yes Significant DDIs? No , we have discussed before separating any calcium supplements with neratinib The patient was instructed to speak with their health care provider and/or the oral chemotherapy pharmacist before starting any new drug, including prescription or over the counter, natural / herbal products, or vitamins.  Supportive Care Continue using diet and loperamide for loose stools. Well controlled.  Dosing Assessment Hepatic adjustments needed? No  Renal adjustments needed? No  Toxicity adjustments needed? No  The current dosing regimen is appropriate to continue at this time.  Follow-Up Plan Continue neratinib 5 tablets (200 mg) daily with food Continue letrozole 2.5 mg daily Continue loperamide as needed for loose stool. She  used diet very well to control loose stools. Will add labs, [name] visit in 2 months. [name] may order bilateral breast MRI at that time for June per patient report. Will add labs, pharmacy clinic visit, in 4 months She will likely finish up with neratinib on 12/28/22  Erick Blinks Lampron participated in the discussion, expressed understanding, and voiced agreement with the above plan. All questions were answered to her satisfaction. The patient was advised to contact the clinic at [phone] with any questions or concerns prior to her return visit.   I spent 30 minutes assessing and educating the patient.  Raina Mina, RPH-CPP, 08/21/2022  1:41 PM   **Disclaimer: This note was dictated with voice recognition software. Similar sounding words can inadvertently be transcribed and this note may contain transcription errors which may not have been corrected upon publication of note.**

## 2022-08-22 ENCOUNTER — Telehealth: Payer: Self-pay | Admitting: Hematology and Oncology

## 2022-08-22 NOTE — Telephone Encounter (Signed)
Called patient per 2/27 los notes to schedule f/u. Patient scheduled and notified.

## 2022-08-23 ENCOUNTER — Other Ambulatory Visit (HOSPITAL_COMMUNITY): Payer: Self-pay

## 2022-08-23 NOTE — Progress Notes (Signed)
Teresa Sheppard, counseling intern, met with patient for their scheduled counseling session.   The patient shared they have been feeling "up and down" over the last two weeks. The patient shared when they are feeling down they have racing thoughts about "what ifs." The patient shared when this happens at night, they distract themselves by listening to a story on their phone.   The patient shared they are in the next steps of setting up a new job. The patient reflected they feel ready to start working in the world again.   The patient and counselor are ending their time together due to the counselor's internship ending. The patient scheduled their last session for Thursday, March 14th at 1:30pm.   Teresa Sheppard,  Counseling Intern  [phone] [email or web address]

## 2022-08-24 ENCOUNTER — Other Ambulatory Visit (HOSPITAL_COMMUNITY): Payer: Self-pay

## 2022-08-24 DIAGNOSIS — R1310 Dysphagia, unspecified: Secondary | ICD-10-CM

## 2022-08-27 ENCOUNTER — Other Ambulatory Visit: Payer: BC Managed Care – PPO

## 2022-08-27 ENCOUNTER — Ambulatory Visit: Payer: BC Managed Care – PPO | Admitting: Pharmacist

## 2022-08-30 ENCOUNTER — Other Ambulatory Visit: Payer: Self-pay | Admitting: Hematology and Oncology

## 2022-08-30 ENCOUNTER — Other Ambulatory Visit: Payer: Self-pay

## 2022-08-30 ENCOUNTER — Other Ambulatory Visit (HOSPITAL_COMMUNITY): Payer: Self-pay

## 2022-08-30 MED ORDER — NERATINIB MALEATE 40 MG PO TABS
200.0000 mg | ORAL_TABLET | Freq: Every day | ORAL | 3 refills | Status: DC
  Filled 2022-08-30: qty 150, 30d supply, fill #0
  Filled 2022-09-28: qty 150, 30d supply, fill #1
  Filled 2022-11-08: qty 150, 30d supply, fill #2

## 2022-08-30 NOTE — Telephone Encounter (Signed)
Can you please review and refill. Thanks

## 2022-09-03 ENCOUNTER — Encounter: Payer: Self-pay | Admitting: Obstetrics and Gynecology

## 2022-09-03 ENCOUNTER — Ambulatory Visit (INDEPENDENT_AMBULATORY_CARE_PROVIDER_SITE_OTHER): Payer: BC Managed Care – PPO | Admitting: Obstetrics and Gynecology

## 2022-09-03 VITALS — BP 114/58

## 2022-09-03 DIAGNOSIS — N390 Urinary tract infection, site not specified: Secondary | ICD-10-CM | POA: Diagnosis not present

## 2022-09-03 DIAGNOSIS — M62838 Other muscle spasm: Secondary | ICD-10-CM | POA: Diagnosis not present

## 2022-09-03 DIAGNOSIS — N3281 Overactive bladder: Secondary | ICD-10-CM

## 2022-09-03 DIAGNOSIS — R350 Frequency of micturition: Secondary | ICD-10-CM | POA: Diagnosis not present

## 2022-09-03 LAB — POCT URINALYSIS DIPSTICK
Bilirubin, UA: NEGATIVE
Blood, UA: NEGATIVE
Glucose, UA: NEGATIVE
Ketones, UA: NEGATIVE
Leukocytes, UA: NEGATIVE
Nitrite, UA: NEGATIVE
Protein, UA: NEGATIVE
Spec Grav, UA: 1.01 (ref 1.010–1.025)
Urobilinogen, UA: 0.2 E.U./dL
pH, UA: 6.5 (ref 5.0–8.0)

## 2022-09-03 MED ORDER — SOLIFENACIN SUCCINATE 5 MG PO TABS: 5.0000 mg | ORAL_TABLET | Freq: Every day | ORAL | 5 refills | Status: DC

## 2022-09-03 NOTE — Patient Instructions (Addendum)
Today we talked about ways to manage bladder urgency such as altering your diet to avoid irritative beverages and foods (bladder diet) as well as attempting to decrease stress and other exacerbating factors.    You can try Prelief prior to eating or drinking something acidic to help reduce the irritation. Stop drinking 2-3 hours prior to bedtime.   The Most Bothersome Foods* The Least Bothersome Foods*  Coffee - Regular & Decaf Tea - caffeinated Carbonated beverages - cola, non-colas, diet & caffeine-free Alcohols - Beer, Red Wine, White Wine, Champagne Fruits - Grapefruit, [place], Orange, [employer] - Cranberry, Grapefruit, Orange, Pineapple Vegetables - Tomato & Tomato Products Flavor Enhancers - Hot peppers, Spicy foods, Chili, Horseradish, Vinegar, Monosodium glutamate (MSG) Artificial Sweeteners - NutraSweet, Sweet 'N Low, Equal (sweetener), Saccharin Ethnic foods - [place], [place], [place] food Express Scripts - low-fat & whole Fruits - Bananas, Blueberries, Honeydew melon, Pears, Raisins, Watermelon Vegetables - Broccoli, Brussels Sprouts, [place], Carrots, Cauliflower, [place], Cucumber, Mushrooms, Peas, Radishes, Squash, Zucchini, White potatoes, Sweet potatoes & yams Poultry - Chicken, Eggs, [place], [employer] - Beef, Programmer, multimedia, Lamb Seafood - Shrimp, [place] fish, Salmon Grains - Oat, Rice Snacks - Pretzels, Popcorn  *Lissa Morales et al. Diet and its role in interstitial cystitis/bladder pain syndrome (IC/BPS) and comorbid conditions. [place] 2012 Jan 11.   Continue with a fingertip size amount of estrogen cream twice a week.

## 2022-09-03 NOTE — Progress Notes (Signed)
**Note Teresa-Identified via Obfuscation** [facility] New Patient Evaluation and Consultation  Referring Provider: Delsa Bern, MD PCP: Wenda Low, MD Date of Service: 09/03/2022  SUBJECTIVE Chief Complaint: New Patient (Initial Visit) Teresa Sheppard is a 62 y.o. female here for a consult for recurrent UTI. Pt said she is experiencing urinary frequency. Pt also she has prolapse that may be bothersome.)  History of Present Illness: Teresa Sheppard is a 62 y.o. White or Caucasian female seen in consultation at the request of Dr. Cletis Media for evaluation of recurrent UTI.    Review of records significant for: Has history of breast cancer and vaginal atrophy. Has tried revaree suppositories and coconut oil, and [employer]. Has recurrent urinary tract infections. Using clobetasol for vestibulitis  Urinary Symptoms: Does not leak urine.   Day time voids 8+.  Nocturia: 4-8 times per night to void. Voiding dysfunction: she does not empty her bladder well.  does not use a catheter to empty bladder.  When urinating, she feels a weak stream and the need to urinate multiple times in a row Drinks: green or black tea, decaf tea or coffee 1-2 times per day, around 60oz water per day, 1 cup caffeinated tea about twice a week Drinks until about 9pm and goes to bed about 10:30pm Has done some pelvic PT but not sure it really helped.   UTIs: 4- 6 UTI's in the last year. Last infection was in December. Symptoms include pain with urination, sometimes scant blood. Cultures were done with PCP or Dr Boyd Kerbs office. Has been taking D-mannose since January. Started vaginal estrogen 2x week with ok from Oncologist.   She has seen Dr Junious Silk and he recommended the estrogen.  Denies history of blood in urine and kidney or bladder stones  Pelvic Organ Prolapse Symptoms:                  She Denies a feeling of a bulge the vaginal area.   Bowel Symptom: Bowel movements: several time(s) per day. Due to Nerlynx which causes  diarrhea Stool consistency: loose Straining: no.  Splinting: no.  Incomplete evacuation: no.  She Denies accidental bowel leakage / fecal incontinence Bowel regimen: diet and fiber Last colonoscopy: Date 10/2021, Results negative  Sexual Function Sexually active: no.    Pelvic Pain Denies pelvic pain For dryness, using the estrogen cream. She is also using luvena and vitamin E.  Had some irritation with the coconut oil and Replens.  She has tried some pelvic floor massage but not recently.   Past Medical History:  Past Medical History:  Diagnosis Date   Anxiety    Breast cancer in female [facility])    Right   Family history of breast cancer 11/07/2020   Family history of pancreatic cancer 11/07/2020   History of kidney stones 2014-2015   Hyperthyroidism    No longer an issue   PONV (postoperative nausea and vomiting)    Thyromegaly      Past Surgical History:   Past Surgical History:  Procedure Laterality Date   ABDOMINAL WALL DEFECT REPAIR     following hernia repair in 2010, no mesh was used   ABDOMINOPLASTY     with hernia repair   APPENDECTOMY     BOWEL RESECTION     2008  while pregnant with last child. Scar tissue from appendectomy wrapped around bowel and was snipped to free bowel.    [place] OF UTERUS     2000  and 2014   HERNIA REPAIR     MASTECTOMY W/ SENTINEL NODE BIOPSY Right 11/28/2020   Procedure: RIGHT MASTECTOMY WITH RIGHT AXILLARY SENTINEL LYMPH NODE BIOPSY;  Surgeon: [name], MD;  Location: [place];  Service: General;  Laterality: Right;   PORT-A-CATH REMOVAL Left 01/30/2021   Procedure: PORT REMOVAL;  Surgeon: [name], MD;  Location: [place];  Service: General;  Laterality: Left;  LOCAL   PORTACATH PLACEMENT Left 07/12/2020   Procedure: INSERTION PORT-A-CATH WITH ULTRASOUND GUIDANCE;  Surgeon: [name], MD;  Location: [place];  Service: General;  Laterality: Left;   RADIOACTIVE SEED GUIDED  AXILLARY SENTINEL LYMPH NODE Right 11/28/2020   Procedure: RADIOACTIVE SEED GUIDED RIGHT AXILLARY AXILLARY SENTINEL LYMPH NODE EXCISION;  Surgeon: [name], MD;  Location: [place];  Service: General;  Laterality: Right;   RADIOACTIVE SEED GUIDED EXCISIONAL BREAST BIOPSY Left 11/28/2020   Procedure: RADIOACTIVE SEED GUIDED LEFT EXCISIONAL BREAST BIOPSY;  Surgeon: [name], MD;  Location: [place];  Service: General;  Laterality: Left;   ROOT CANAL     2019   TONSILLECTOMY  1969   URETERAL EXPLORATION     dilataion for congenitally small ureter     Past OB/GYN History: OB History  Gravida Para Term Preterm AB Living  '5 4 3 1 1 5  '$ SAB IAB Ectopic Multiple Live Births  1       4    # Outcome Date GA Lbr Len/2nd Weight Sex Delivery Anes PTL Lv  5 Preterm      CS-LTranv     4 Term      Vag-Spont     3 Term      Vag-Spont     2 Term      Vag-Spont     1 SAB             Menopausal: had some vaginal bleeding with negative TVUS at Dr Boyd Kerbs office Last pap smear was 2022- negative.     Medications: She has a current medication list which includes the following prescription(s): acetaminophen, biotin w/ vitamins c & e, cvs calcium soft chews, cholecalciferol, d-mannose, estradiol, lactobacillus-inulin, letrozole, vaginal moisturizer, multiple vitamins-minerals, neratinib maleate, polyethyl glycol-propyl glycol, solifenacin, and vitamin e.   Allergies: Patient is allergic to shellfish-derived products, ciprofibrate, oxycodone hcl, propylthiouracil, shellfish allergy, shrimp extract, hydrocodone, and penicillins.   Social History:  Social History   Tobacco Use   Smoking status: Never   Smokeless tobacco: Never  Vaping Use   Vaping Use: Never used  Substance Use Topics   Alcohol use: Yes    Comment: occas   Drug use: Never    Relationship status: married She lives with husband and children.   She is not employed. Regular exercise: Yes: weights, walking/ running,  stretching History of abuse: No  Family History:   Family History  Problem Relation Age of Onset   Breast cancer Mother 68       metastatic   Breast cancer Sister 17   Breast cancer Maternal Aunt        dx 54s   Pancreatic cancer Father 31   Pancreatic cancer Paternal Grandfather        dx 37s   Breast cancer Sister 3   Leukemia Nephew 8   Cancer Maternal Aunt        unknown type; ?bone; dx late 29s     Review of Systems: Review of Systems  Constitutional:  Positive for malaise/fatigue. Negative for fever and weight  loss.  Respiratory:  Negative for cough, shortness of breath and wheezing.   Cardiovascular:  Negative for chest pain, palpitations and leg swelling.  Gastrointestinal:  Negative for abdominal pain and blood in stool.  Genitourinary:  Negative for dysuria.  Musculoskeletal:  Negative for myalgias.  Skin:  Negative for rash.  Neurological:  Positive for headaches. Negative for dizziness.  Endo/Heme/Allergies:  Does not bruise/bleed easily.  Psychiatric/Behavioral:  Negative for depression. The patient is nervous/anxious.      OBJECTIVE Physical Exam: Vitals:   09/03/22 1058  BP: (!) 114/58    Physical Exam Constitutional:      General: She is not in acute distress. Pulmonary:     Effort: Pulmonary effort is normal.  Abdominal:     General: There is no distension.     Palpations: Abdomen is soft.     Tenderness: There is no abdominal tenderness. There is no rebound.  Musculoskeletal:        General: No swelling. Normal range of motion.  Skin:    General: Skin is warm and dry.     Findings: No rash.  Neurological:     Mental Status: She is alert and oriented to person, place, and time.  Psychiatric:        Mood and Affect: Mood normal.        Behavior: Behavior normal.      GU / Detailed Urogynecologic Evaluation:  Pelvic Exam: Normal external female genitalia; Bartholin's and Skene's glands normal in appearance; urethral meatus normal in  appearance, no urethral masses or discharge.   CST: negative  Speculum exam reveals normal vaginal mucosa without atrophy. Cervix normal appearance. Uterus normal single, nontender. Adnexa no mass, fullness, tenderness.     Pelvic floor strength II/V  Pelvic floor musculature: Right levator non-tender, Right obturator tender, Left levator non-tender, Left obturator tender, L>R  POP-Q:   POP-Q  -2.5                                            Aa   -2.5                                           Ba  -7                                              C   2.5                                            Gh  3                                            Pb  8.5  tvl   -2.5                                            Ap  -2.5                                            Bp  -8.5                                              D      Rectal Exam:  Normal external rectum  Post-Void Residual (PVR) by Bladder Scan: In order to evaluate bladder emptying, we discussed obtaining a postvoid residual and she agreed to this procedure.  Procedure: The ultrasound unit was placed on the patient's abdomen in the suprapubic region after the patient had voided. A PVR of 0 ml was obtained by bladder scan.  Laboratory Results: POC urine: negative  ASSESSMENT AND PLAN Ms. Goldsmith is a 62 y.o. with:  1. Overactive bladder   2. Urinary frequency   3. Levator spasm   4. Recurrent urinary tract infection    OAB - We discussed the symptoms of overactive bladder (OAB), which include urinary urgency, urinary frequency, nocturia, with or without urge incontinence.  While we do not know the exact etiology of OAB, several treatment options exist. We discussed management including behavioral therapy (decreasing bladder irritants, urge suppression strategies, timed voids, bladder retraining), physical therapy, medication; for refractory cases posterior tibial nerve  stimulation, sacral neuromodulation, and intravesical botulinum toxin injection.  - She will work on reducing bladder irritants.  - prescribed vesicare '5mg'$  daily. For anticholinergic medications, we discussed the potential side effects of anticholinergics including dry eyes, dry mouth, constipation, cognitive impairment and urinary retention.  2. Levator spasm - Has done PT and will start again with some self pelvic floor massage.  - Will consider pelvic PT again  3. Recurrent UTI/ vaginal atrophy - continue with vaginal estrogen twice weekly - continue with vitamin E as needed - continue with D-mannose  Return 6 weeks for follow up   Jaquita Folds, MD

## 2022-09-04 ENCOUNTER — Other Ambulatory Visit (HOSPITAL_COMMUNITY): Payer: Self-pay

## 2022-09-05 ENCOUNTER — Ambulatory Visit (HOSPITAL_COMMUNITY)
Admission: RE | Admit: 2022-09-05 | Discharge: 2022-09-05 | Disposition: A | Discharge status: Home / Self Care | Payer: BC Managed Care – PPO | Source: Ambulatory Visit | Attending: Internal Medicine | Admitting: Internal Medicine

## 2022-09-05 DIAGNOSIS — R1313 Dysphagia, pharyngeal phase: Secondary | ICD-10-CM | POA: Diagnosis not present

## 2022-09-05 DIAGNOSIS — R1310 Dysphagia, unspecified: Secondary | ICD-10-CM

## 2022-09-05 NOTE — Therapy (Signed)
Modified Barium Swallow Study  Patient Details  Name: Teresa Sheppard MRN: [id number] Date of Birth: 1961/02/21  Today's Date: 09/05/2022  Modified Barium Swallow completed.  Full report located under Chart Review in the Imaging Section.  History of Present Illness Teresa Sheppard is a 62 y.o. female with PMH: right breast cancer s/p mastectomy and radiation tx, PONV, anxiety, multi-nodular thyroid, self-reported occasional swallowing problems since age 67. She presented to this outpatient MBS secondary to patient c/o worsening dysphagia. During interview with SLP, she described difficulties with swallowing pills, raw bell peppers and sometimes other foods such as spaghetti noodles. When having difficulty with swallowing, she has associated pain as well.   Clinical Impression Patient presents with a very mild pharyngeal phase dysphagia which appears to be sensory in nature. With thin liquids and nectar thick liquids, patient exhibited a delay in swallow initiation to level of pyriform sinus. No penetration or aspiration observed with any of the tested barium consistencies. She did exhibit trace to minimal amount of thin liquid barium remaining in vallecular sinus s/p initial swallow but with clearance of majority of barium after secondary swallow. (did benefit from cue to perform at times as patient did not consistently appear to sense this residue) Patient tolerated dysphagia 2 and 3 solids as well as 1/2 a 51m barium tablet without significant difficulty although she did appear somewhat apprehensive when doing so. PES opened fully and no barium stasis observed at any level of the esophagus with full clearance of barium into stomach observed. No retrograde movement of barium observed. SLP suspects patient has some decreased sensory input, resulting in swallow initiation delay as well as sensation of solids in pharynx and/or esophagus after boluses have already transited. In addition, it does appear  that patient has developed some anxiety with swallowing larger pills as she described an event of feeling choked on a pill and subsequent need to go to ER. SLP provided patient with recommendations to try to ease her swallowing of pills.  Recommend patient drink water prior to and during intake of pills, try eating gelatin prior to and/or during pill intake for temporary slickness to throat, intermittent dry swallow during PO intake. Patient and SLP in agreement that she does not need therapy for swallow at this point however if she continues to have difficulty with PO intake with pills or if she feels that her swallowing is becoming more difficult/worse, recommendation is for OP SLP to work on what may be maladaptive swallowing behaviors secondary to anxiety/fear of choking.    Swallow Evaluation Recommendations Recommendations: PO diet PO Diet Recommendation: Regular;Thin liquids (Level 0) Liquid Administration via: Cup;Straw Medication Administration: Other (Comment) (as tolerated, drink water before, try gelatin before and/or during pill intake) Supervision: Patient able to self-feed    JSonia Baller MA, CCC-SLP Speech Therapy

## 2022-09-06 ENCOUNTER — Encounter: Payer: Self-pay | Admitting: Hematology and Oncology

## 2022-09-06 NOTE — Progress Notes (Signed)
Teresa Sheppard, counseling intern, met with patient for scheduled counseling session.   The patient reflected they have been spending their time helping their daughter with a scholarship application to study in [place] and preparing for the launch of SAT prep classes. The patient spoke about a desire to expand their social life whether that's meeting people through a book club or a workout class.   The counselor reflected they have seen a change in the patient. The patient has started to look outward and has shown a desire to connect with the community more through work and friendship.   The patient shared they still experience anxiety. The counselor shared meditation techniques like walking meditation and the 5,4,3,2,1 senses technique.   This was the last session between the therapist and counseling intern.   Teresa Sheppard,  Counseling Intern  [phone] [email or web address]

## 2022-09-07 NOTE — Progress Notes (Signed)
Results of swallowing study and speech pathology recommendations noted.  Follow up with surgery as needed.  Armandina Gemma, MD [facility] [place] practice Office: [phone]

## 2022-09-19 ENCOUNTER — Encounter: Payer: Self-pay | Admitting: Hematology and Oncology

## 2022-09-21 NOTE — Progress Notes (Signed)
Patient Care Team: Wenda Low, MD as PCP - General (Internal Medicine) [name], MD as Consulting Physician (General Surgery) Nicholas Lose, MD as Medical Oncologist (Hematology and Oncology) Kyung Rudd, MD as Consulting Physician (Radiation Oncology) Jacelyn Pi, MD as Referring Physician (Endocrinology) Lona Millard, MD as Referring Physician (Endocrinology) Arta Silence, MD as Consulting Physician (Gastroenterology) Raina Mina, RPH-CPP as Pharmacist (Hematology and Oncology)  DIAGNOSIS: No diagnosis found.  SUMMARY OF ONCOLOGIC HISTORY: Oncology History  Malignant neoplasm of overlapping sites of right breast in female, estrogen receptor positive ([place])  06/21/2020 Initial Diagnosis   Patient palpated a right breast mass x 1 wk. Mammogram showed multiple confluent masses in the right breast at the 7 o'clock position measuring 2.3cm and at the 9 o'clock position measuring 2.2cm with one right axillary lymph node with cortical thickening. Biopsy showed invasive mammary carcinoma at both positions and in the axilla, grade 2-3, HER-2 equivocal by IHC (2+), positive by FISH (ratio 3.34), ER+ >95%, PR+ 25%, Ki67 60%.   06/29/2020 Cancer Staging   Staging form: Breast, AJCC 8th Edition - Clinical stage from 06/29/2020: Stage IB (cT2, cN1(f), cM0, G3, ER+, PR+, HER2+) - Signed by Nicholas Lose, MD on 06/29/2020   07/13/2020 - 11/16/2020 Neo-Adjuvant Chemotherapy   Taxotere, Carbo, Herceptin, Perjeta x 6 followed by 1 cycle of herceptin/perjeta   10/28/2020 Breast MRI   Complete imaging response   11/14/2020 Genetic Testing   Negative hereditary cancer genetic testing: no pathogenic variants detected in [facility] BRCAPlus Panel and [facility] CancerNext-Expanded +RNAinsight Panel.  The report dates are Nov 14, 2020 and December 05, 2020, respectively.    The BRCAplus panel offered by [employer] and includes sequencing and deletion/duplication analysis for the following 8 genes:  ATM, BRCA1, BRCA2, CDH1, CHEK2, PALB2, PTEN, and TP53.  The CancerNext-Expanded gene panel offered by [facility] and includes sequencing, rearrangement, and RNA analysis for the following 77 genes: AIP, ALK, APC, ATM, AXIN2, BAP1, BARD1, BLM, BMPR1A, BRCA1, BRCA2, BRIP1, CDC73, CDH1, CDK4, CDKN1B, CDKN2A, CHEK2, CTNNA1, DICER1, FANCC, FH, FLCN, GALNT12, KIF1B, LZTR1, MAX, MEN1, MET, MLH1, MSH2, MSH3, MSH6, MUTYH, NBN, NF1, NF2, NTHL1, PALB2, PHOX2B, PMS2, POT1, PRKAR1A, PTCH1, PTEN, RAD51C, RAD51D, RB1, RECQL, RET, SDHA, SDHAF2, SDHB, SDHC, SDHD, SMAD4, SMARCA4, SMARCB1, SMARCE1, STK11, SUFU, TMEM127, TP53, TSC1, TSC2, VHL and XRCC2 (sequencing and deletion/duplication); EGFR, EGLN1, HOXB13, KIT, MITF, PDGFRA, POLD1, and POLE (sequencing only); EPCAM and GREM1 (deletion/duplication only).    11/28/2020 Surgery   Right mastectomy [name]): IDC, grade 3, spanning 4.0cm fibrotic area, clear margins, 1/4 right axillary lymph nodes positive for carcinoma, 0.5cm.   Left lumpectomy: no evidence of malignancy   11/28/2020 Cancer Staging   Staging form: Breast, AJCC 8th Edition - Pathologic stage from 11/28/2020: No Stage Recommended (ypT2, pN1a, cM0) - Signed by Gardenia Phlegm, NP on 07/26/2021 Stage prefix: Post-therapy   12/07/2020 - 05/23/2021 Chemotherapy   Patient is on Treatment Plan : BREAST ADO-Trastuzumab Emtansine (Kadcyla) q21d     01/04/2021 - 02/17/2021 Radiation Therapy   Site Technique Total Dose (Gy) Dose per Fx (Gy) Completed Fx Beam Energies  Chest Wall, Right: CW_Rt 3D 50.4/50.4 1.8 28/28 6X  Chest Wall, Right: CW_Rt_SCLV 3D 50.4/50.4 1.8 28/28 6X, 10X  Chest Wall, Right: CW_Rt_Bst Electron 10/10 2 5/5 6E     12/2020 -  Anti-estrogen oral therapy   Letrozole daily     CHIEF COMPLIANT:  Follow-up right breast cancer on Nertinib and letrozole   INTERVAL HISTORY: Teresa Sheppard is  a 62 y.o with the above-mentioned right breast cancer on Nertinib and letrozole  . She  presents to the clinic for today for a follow-up.    ALLERGIES:  is allergic to shellfish-derived products, ciprofibrate, oxycodone hcl, propylthiouracil, shellfish allergy, shrimp extract, hydrocodone, and penicillins.  MEDICATIONS:  Current Outpatient Medications  Medication Sig Dispense Refill   acetaminophen (TYLENOL) 325 MG tablet Take 325 mg by mouth every 6 (six) hours as needed (headaches / pain).     Biotin w/ Vitamins C & E (HAIR SKIN & NAILS GUMMIES PO) Take by mouth.     Calcium-Vitamin D-Vitamin K ([employer] CALCIUM SOFT CHEWS) 650-12.5-40 MG-MCG-MCG CHEW Chew 1 tablet by mouth 2 (two) times daily.     cholecalciferol (VITAMIN D3) 25 MCG (1000 UNIT) tablet Take 2,000 Units by mouth daily.     D-Mannose 500 MG CAPS Take 500 mg by mouth in the morning and at bedtime.     estradiol (ESTRACE VAGINAL) 0.1 MG/GM vaginal cream Place 1 Applicatorful vaginally at bedtime. (Patient taking differently: Place 1 Applicatorful vaginally at bedtime. Per urology, 1g  two times per week) 42.5 g 12   Lactobacillus-Inulin (CULTURELLE ADULT ULT BALANCE PO) Take by mouth daily.     letrozole (FEMARA) 2.5 MG tablet TAKE 1 TABLET(2.5 MG) BY MOUTH DAILY 90 tablet 3   LUVENA VAGINAL MOISTURIZER VA Place vaginally. Once a week     Multiple Vitamins-Minerals (HAIR/SKIN/NAILS/BIOTIN PO) Take 1 capsule by mouth daily.     Neratinib Maleate (NERLYNX) 40 MG tablet Take 5 tablets (200 mg total) by mouth daily. Take with food. 150 tablet 3   Polyethyl Glycol-Propyl Glycol (SYSTANE OP) Place 1 drop into both eyes daily as needed (dry eyes).     solifenacin (VESICARE) 5 MG tablet Take 1 tablet (5 mg total) by mouth daily. 30 tablet 5   vitamin E 28000 units external oil Apply topically daily. Vaginally 2-3 times per week     No current facility-administered medications for this visit.    PHYSICAL EXAMINATION: ECOG PERFORMANCE STATUS: {CHL ONC ECOG PS:[id number]}  There were no vitals filed for this visit. There  were no vitals filed for this visit.  BREAST:*** No palpable masses or nodules in either right or left breasts. No palpable axillary supraclavicular or infraclavicular adenopathy no breast tenderness or nipple discharge. (exam performed in the presence of a chaperone)  LABORATORY DATA:  I have reviewed the data as listed    Latest Ref Rng & Units 08/21/2022   12:46 PM 06/28/2022   11:53 AM 04/26/2022    2:36 PM  CMP  Glucose 70 - 99 mg/dL 73  84  75   BUN 8 - 23 mg/dL 14  14  16    Creatinine 0.44 - 1.00 mg/dL 0.87  0.78  0.89   Sodium 135 - 145 mmol/L 139  138  141   Potassium 3.5 - 5.1 mmol/L 4.2  4.1  4.4   Chloride 98 - 111 mmol/L 102  104  105   CO2 22 - 32 mmol/L 31  29  32   Calcium 8.9 - 10.3 mg/dL 9.4  9.7  9.9   Total Protein 6.5 - 8.1 g/dL 7.9  7.3  7.7   Total Bilirubin 0.3 - 1.2 mg/dL 0.5  0.5  0.5   Alkaline Phos 38 - 126 U/L 92  109  118   AST 15 - 41 U/L 39  37  37   ALT 0 - 44 U/L 24  30  28     Lab Results  Component Value Date   WBC 3.8 (L) 08/21/2022   HGB 13.1 08/21/2022   HCT 39.5 08/21/2022   MCV 93.8 08/21/2022   PLT 164 08/21/2022   NEUTROABS 2.0 08/21/2022    ASSESSMENT & PLAN:  No problem-specific Assessment & Plan notes found for this encounter.    No orders of the defined types were placed in this encounter.  The patient has a good understanding of the overall plan. she agrees with it. she will call with any problems that may develop before the next visit here. Total time spent: 30 mins including face to face time and time spent for planning, charting and co-ordination of care   Suzzette Righter, [place] 09/21/22    I Gardiner Coins am acting as a Education administrator for [employer]  ***

## 2022-09-25 ENCOUNTER — Inpatient Hospital Stay: Payer: BC Managed Care – PPO | Attending: Hematology and Oncology | Admitting: Hematology and Oncology

## 2022-09-25 VITALS — BP 128/55 | HR 62 | Temp 97.9°F | Resp 18 | Ht 70.0 in | Wt 153.7 lb

## 2022-09-25 DIAGNOSIS — G620 Drug-induced polyneuropathy: Secondary | ICD-10-CM | POA: Diagnosis not present

## 2022-09-25 DIAGNOSIS — Z79811 Long term (current) use of aromatase inhibitors: Secondary | ICD-10-CM | POA: Diagnosis not present

## 2022-09-25 DIAGNOSIS — Z170 Estrogen receptor positive status [ER+]: Secondary | ICD-10-CM

## 2022-09-25 DIAGNOSIS — C50811 Malignant neoplasm of overlapping sites of right female breast: Secondary | ICD-10-CM | POA: Diagnosis not present

## 2022-09-25 NOTE — Assessment & Plan Note (Signed)
06/21/2020:Patient palpated a right breast mass x 1 wk. Mammogram showed multiple confluent masses in the right breast at the 7 o'clock position measuring 2.3cm and at the 9 o'clock position measuring 2.2cm with one right axillary lymph node with cortical thickening. Biopsy showed invasive mammary carcinoma at both positions and in the axilla, grade 2-3, HER-2 equivocal by IHC (2+), positive by FISH (ratio 3.34), ER+ >95%, PR+ 25%, Ki67 60%.   Treatment plan: 1. Neoadjuvant chemotherapy with TCH Perjeta 6 cycles completed 10/26/2020 followed by Herceptin Perjeta versus [name] for 1 year completed 05/23/2021 2.  Right mastectomy [name]): IDC, grade 3, spanning 4.0cm fibrotic area, clear margins, 1/4 right axillary lymph nodes positive for carcinoma, 0.5cm. Left lumpectomy: no evidence of malignancy 3. Followed by adjuvant radiation therapy completed 02/17/2021 4.  Followed by adjuvant antiestrogen therapy started 12/29/2020 5.  Followed by neratinib (started 12/28/2021) ---------------------------------------------------------------------------------------------------------------------------------- Current Treatment:letrozole  COVID infection: August 2023: Treated with antiviral medication. (Molnupiravir) Letrozole toxicities: Tolerating it extremely well.    Surveillance:  1. M[facility] June 08, 2021 [facility]: Benign Bone density: November 2022: T score -2.4:  2. Breast MRI: 11/17/2021: Benign breast density category C   Current Treatment: Neratinib held for COVID-19 infection: Restarted neratinib on 01/30/2022. Neratinib toxicities: 1.  Diarrhea is manageable when she takes 5 tablets a day.  I instructed her to go down to 5 tablets a day.  This will be her maintenance dose. 2. mild dizziness: Probably orthostatic in nature. 3.  Mild fatigue    Multiple and frequent UTIs: I gave her prescription for Estrace cream to prevent vaginal dryness. Return to clinic every 2 months with  labs and follow-ups

## 2022-09-26 ENCOUNTER — Ambulatory Visit: Payer: BC Managed Care – PPO

## 2022-09-28 ENCOUNTER — Other Ambulatory Visit (HOSPITAL_COMMUNITY): Payer: Self-pay

## 2022-09-30 NOTE — Therapy (Signed)
OUTPATIENT PHYSICAL THERAPY ONCOLOGY EVALUATION  Patient Name: Teresa BarkerCarol M Sheppard MRN: [id number] DOB:16-Aug-1960, 62 y.o., female Today's Date: 10/01/2022  END OF SESSION:  PT End of Session - 10/01/22 0804     Visit Number 1    Number of Visits 12    Date for PT Re-Evaluation 11/12/22    PT Start Time 0805    PT Stop Time 0855    PT Time Calculation (min) 50 min    Activity Tolerance Patient tolerated treatment well    Behavior During Therapy [facility] for tasks assessed/performed             Past Medical History:  Diagnosis Date   Anxiety    Breast cancer in female [facility](HCC)    Right   Family history of breast cancer 11/07/2020   Family history of pancreatic cancer 11/07/2020   History of kidney stones 2014-2015   Hyperthyroidism    No longer an issue   PONV (postoperative nausea and vomiting)    Thyromegaly    Past Surgical History:  Procedure Laterality Date   ABDOMINAL WALL DEFECT REPAIR     following hernia repair in 2010, no mesh was used   ABDOMINOPLASTY     with hernia repair   APPENDECTOMY     BOWEL RESECTION     2008  while pregnant with last child. Scar tissue from appendectomy wrapped around bowel and was snipped to free bowel.    CESAREAN SECTION     DILATION AND CURETTAGE OF UTERUS     2000 and 2014   HERNIA REPAIR     MASTECTOMY W/ SENTINEL NODE BIOPSY Right 11/28/2020   Procedure: RIGHT MASTECTOMY WITH RIGHT AXILLARY SENTINEL LYMPH NODE BIOPSY;  Surgeon: Emelia LoronWakefield, Matthew, MD;  [facility];  Service: General;  Laterality: Right;   PORT-A-CATH REMOVAL Left 01/30/2021   Procedure: PORT REMOVAL;  Surgeon: Emelia LoronWakefield, Matthew, MD;  [facility];  Service: General;  Laterality: Left;  LOCAL   PORTACATH PLACEMENT Left 07/12/2020   Procedure: INSERTION PORT-A-CATH WITH ULTRASOUND GUIDANCE;  Surgeon: Emelia LoronWakefield, Matthew, MD;  Location: [facility] OR;  Service: General;  Laterality: Left;   RADIOACTIVE SEED GUIDED AXILLARY SENTINEL LYMPH NODE Right 11/28/2020    Procedure: RADIOACTIVE SEED GUIDED RIGHT AXILLARY AXILLARY SENTINEL LYMPH NODE EXCISION;  Surgeon: Emelia LoronWakefield, Matthew, MD;  Location: [facility] OR;  Service: General;  Laterality: Right;   RADIOACTIVE SEED GUIDED EXCISIONAL BREAST BIOPSY Left 11/28/2020   Procedure: RADIOACTIVE SEED GUIDED LEFT EXCISIONAL BREAST BIOPSY;  Surgeon: Emelia LoronWakefield, Matthew, MD;  [facility];  Service: General;  Laterality: Left;   ROOT CANAL     2019   TONSILLECTOMY  1969   URETERAL EXPLORATION     dilataion for congenitally small ureter   Patient Active Problem List   Diagnosis Date Noted   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   Vitamin D deficiency 08/30/2021   Osteoporosis 08/30/2021   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   Genetic testing 11/15/2020   Family history of breast cancer 11/07/2020   Family history of pancreatic cancer 11/07/2020   Dysphagia 10/26/2020   Malignant neoplasm of overlapping sites of right breast in female, estrogen receptor positive 06/21/2020   Hyperthyroidism 03/28/2016      REFERRING PROVIDER: Emelia LoronWakefield, Matthew, MD  REFERRING DIAG: s/p Right Breast Cancer  THERAPY DIAG:  Malignant neoplasm of overlapping sites of right breast in female, estrogen receptor positive  ONSET DATE: November 2023  Rationale for Evaluation and Treatment: Rehabilitation  SUBJECTIVE:  SUBJECTIVE STATEMENT: With reaching to the night table she she gets tightness like really tight tape under her mastectomy incision. She feels some lumps in her arm pit. When she does am stretches she is tight to start and then she loosens up. She stretches 1 x per day.  PERTINENT HISTORY:    R breast cancer, left outer quadrant, triple positive, Ki 67 - 60%, grade 2 -3 IDC, neoadjuvant chemo and immuno therapy,   right mastectomy on 11/28/2020 with 4 nodes removed on the right. She also had radiation ending 02/17/2021. and she had an excision of breast tissue on the left that was benign   PAIN:  Are you having pain? Yes NPRS scale: 0/10- 6/10 Pain location: Under mastectomy incision Pain orientation: Right  PAIN TYPE: sharp and tight Pain description: intermittent  Aggravating factors: reaching to night stand, when first starting to stretch its tight Relieving factors: not reaching  PRECAUTIONS: Right UE lymphedema risk  WEIGHT BEARING RESTRICTIONS: No  FALLS:  Has patient fallen in last 6 months? 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 A little restricted at lateral border, and more tender  SENSATION: Light touch: Deficits light numbness posterior right arm and axilla  POSTURE: forward head, rounded shoulders  UPPER EXTREMITY AROM/PROM:  A/PROM RIGHT   eval   Shoulder extension 60  Shoulder flexion 144  Shoulder abduction 163  Shoulder internal rotation 70  Shoulder external rotation 98    (Blank rows = not tested)  A/PROM LEFT   eval  Shoulder extension 58  Shoulder flexion 153  Shoulder abduction 170  Shoulder internal rotation 70  Shoulder external rotation 96    (Blank rows = not  tested)  CERVICAL AROM: All within normal limits:    UPPER EXTREMITY STRENGTH: WNL   LYMPHEDEMA ASSESSMENTS:   SURGERY TYPE/DATE: Right Mastectomy 11/28/2020  NUMBER OF LYMPH NODES REMOVED: 4  CHEMOTHERAPY: Yes  RADIATION:Yes  HORMONE TREATMENT: NO  INFECTIONS: NO  LYMPHEDEMA ASSESSMENTS:   LANDMARK RIGHT  eval  10 cm proximal to olecranon process 26.7  Olecranon process 23.6  10 cm proximal to ulnar styloid process 17.5  Just proximal to ulnar styloid process 14.4  Across hand at thumb web space 17.3  At base of 2nd digit 5.8  (Blank rows = not tested)  LANDMARK LEFT  eval  10 cm proximal to olecranon process 28.4  Olecranon process 24.3  10 cm proximal to ulnar styloid process 17.9  Just proximal to ulnar styloid process 14.6  Across hand at thumb web space 17.3  At base of 2nd digit 5.5  (Blank rows = not tested)      QUICK DASH SURVEY: 14%   TODAY'S TREATMENT:  DATE: 10/01/2022  PATIENT EDUCATION:  Education details: pt to perform gentle stretches she is already doing but to add an additional session in the afternoon/evening. Educated in LTR with arms outstretched and and gave handout. Also educated in scar massage to mastectomy incision. Person educated: Patient Education method: Chief Technology Officer Education comprehension: verbalized understanding and returned demonstration  HOME EXERCISE PROGRAM: LTR with arms outstretched, stargazer, scar massage  ASSESSMENT:  CLINICAL IMPRESSION: Patient is a 62 y.o. female who was seen today for physical therapy evaluation and treatment for complaints of tightness in the right chest region of her mastectomy scar and axillary region. Significant pain with reaching to a night stand. Pain/tightness has been present for 6 months. She recently added push ups and chest press  to  her exercise routine and was advised to hold on these temporarily. She has significant increased muscle tension in the right upper quarter, especially pectorals. She has mild limitations in Right shoulder flexion and abduction. There is no measurable increase in circumference noted today. She will benefit from skilled PT to address deficits and return to PLOF  OBJECTIVE IMPAIRMENTS: decreased activity tolerance, decreased mobility, decreased ROM, impaired UE functional use, postural dysfunction, and pain.   ACTIVITY LIMITATIONS: reach over head and to the side  PARTICIPATION LIMITATIONS:  Pt is doing all that is required but is cautious with reaching  PERSONAL FACTORS: 1-2 comorbidities: Right breast triple positive Breast cancer s/p right mastectomy with chemo and radiation  are also affecting patient's functional outcome.   REHAB POTENTIAL: Good  CLINICAL DECISION MAKING: Stable/uncomplicated  EVALUATION COMPLEXITY: Low  GOALS: Goals reviewed with patient? Yes  SHORT TERM GOALS=LONG TERM GOALS: Target date: 5/202/2024  Pt will be independent in a HEP for gentle stretching of  mastectomy scar and right upper quarter to relieve pain Baseline: Goal status: INITIAL  2.  Pt will note decreased tightness/pain by atleast 50% Baseline:  Goal status: INITIAL  3.  Pt will be able to reach to her nightstand without sharp pain Baseline:  Goal status: INITIAL  4.  Pts right shoulder flexion and abduction wiill be WNL Baseline: flex 144, abd 163 Goal status: INITIAL     PLAN:  PT FREQUENCY: 2x/week  PT DURATION: 6 weeks  PLANNED INTERVENTIONS: Therapeutic exercises, Therapeutic activity, Patient/Family education, Self Care, Joint mobilization, Orthotic/Fit training, Dry Needling, scar mobilization, Manual therapy, and Re-evaluation  PLAN FOR NEXT SESSION: STM to right UQ to decrease tissue tension, supine wand scaption, review LTR with arms outstretched, MFR to right chest area,  PROM Progress to strength when ready. Pt will be travelling some (NO SOZO noted in chart due to plate in wrist)   Waynette Buttery, PT 10/01/2022, 9:00 AM

## 2022-10-01 ENCOUNTER — Ambulatory Visit: Payer: BC Managed Care – PPO | Attending: Hematology and Oncology

## 2022-10-01 ENCOUNTER — Other Ambulatory Visit: Payer: Self-pay

## 2022-10-01 DIAGNOSIS — M62838 Other muscle spasm: Secondary | ICD-10-CM | POA: Diagnosis not present

## 2022-10-01 DIAGNOSIS — C50811 Malignant neoplasm of overlapping sites of right female breast: Secondary | ICD-10-CM | POA: Diagnosis not present

## 2022-10-01 DIAGNOSIS — M25611 Stiffness of right shoulder, not elsewhere classified: Secondary | ICD-10-CM | POA: Insufficient documentation

## 2022-10-01 DIAGNOSIS — Z170 Estrogen receptor positive status [ER+]: Secondary | ICD-10-CM | POA: Diagnosis not present

## 2022-10-01 DIAGNOSIS — Z483 Aftercare following surgery for neoplasm: Secondary | ICD-10-CM | POA: Insufficient documentation

## 2022-10-09 ENCOUNTER — Ambulatory Visit: Payer: BC Managed Care – PPO

## 2022-10-09 DIAGNOSIS — M25611 Stiffness of right shoulder, not elsewhere classified: Secondary | ICD-10-CM

## 2022-10-09 DIAGNOSIS — Z483 Aftercare following surgery for neoplasm: Secondary | ICD-10-CM | POA: Diagnosis not present

## 2022-10-09 DIAGNOSIS — Z170 Estrogen receptor positive status [ER+]: Secondary | ICD-10-CM

## 2022-10-09 DIAGNOSIS — M62838 Other muscle spasm: Secondary | ICD-10-CM | POA: Diagnosis not present

## 2022-10-09 DIAGNOSIS — C50811 Malignant neoplasm of overlapping sites of right female breast: Secondary | ICD-10-CM | POA: Diagnosis not present

## 2022-10-09 NOTE — Therapy (Addendum)
OUTPATIENT PHYSICAL THERAPY ONCOLOGY EVALUATION  Patient Name: Teresa Sheppard MRN: [id number] DOB:09/27/1960, 62 y.o., female Today's Date: 10/09/2022  END OF SESSION:  PT End of Session - 10/09/22 1407     Visit Number 2    Number of Visits 12    Date for PT Re-Evaluation 11/12/22    PT Start Time 1407    PT Stop Time 1500    PT Time Calculation (min) 53 min    Activity Tolerance Patient tolerated treatment well    Behavior During Therapy [facility] for tasks assessed/performed             Past Medical History:  Diagnosis Date   Anxiety    Breast cancer in female    Right   Family history of breast cancer 11/07/2020   Family history of pancreatic cancer 11/07/2020   History of kidney stones 2014-2015   Hyperthyroidism    No longer an issue   PONV (postoperative nausea and vomiting)    Thyromegaly    Past Surgical History:  Procedure Laterality Date   ABDOMINAL WALL DEFECT REPAIR     following hernia repair in 2010, no mesh was used   ABDOMINOPLASTY     with hernia repair   APPENDECTOMY     BOWEL RESECTION     2008  while pregnant with last child. Scar tissue from appendectomy wrapped around bowel and was snipped to free bowel.    CESAREAN SECTION     DILATION AND CURETTAGE OF UTERUS     2000 and 2014   HERNIA REPAIR     MASTECTOMY W/ SENTINEL NODE BIOPSY Right 11/28/2020   Procedure: RIGHT MASTECTOMY WITH RIGHT AXILLARY SENTINEL LYMPH NODE BIOPSY;  Surgeon: Emelia Loron, MD;  Location: [facility] OR;  Service: General;  Laterality: Right;   PORT-A-CATH REMOVAL Left 01/30/2021   Procedure: PORT REMOVAL;  Surgeon: Emelia Loron, MD;  Location: [facility] OR;  Service: General;  Laterality: Left;  LOCAL   PORTACATH PLACEMENT Left 07/12/2020   Procedure: INSERTION PORT-A-CATH WITH ULTRASOUND GUIDANCE;  Surgeon: Emelia Loron, MD;  Location: [facility] OR;  Service: General;  Laterality: Left;   RADIOACTIVE SEED GUIDED AXILLARY SENTINEL LYMPH NODE Right 11/28/2020   Procedure:  RADIOACTIVE SEED GUIDED RIGHT AXILLARY AXILLARY SENTINEL LYMPH NODE EXCISION;  Surgeon: Emelia Loron, MD;  Location: [facility] OR;  Service: General;  Laterality: Right;   RADIOACTIVE SEED GUIDED EXCISIONAL BREAST BIOPSY Left 11/28/2020   Procedure: RADIOACTIVE SEED GUIDED LEFT EXCISIONAL BREAST BIOPSY;  Surgeon: Emelia Loron, MD;  Location: [facility] OR;  Service: General;  Laterality: Left;   ROOT CANAL     2019   TONSILLECTOMY  1969   URETERAL EXPLORATION     dilataion for congenitally small ureter   Patient Active Problem List   Diagnosis Date Noted   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   Vitamin D deficiency 08/30/2021   Osteoporosis 08/30/2021   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   Genetic testing 11/15/2020   Family history of breast cancer 11/07/2020   Family history of pancreatic cancer 11/07/2020   Dysphagia 10/26/2020   Malignant neoplasm of overlapping sites of right breast in female, estrogen receptor positive 06/21/2020   Hyperthyroidism 03/28/2016      REFERRING PROVIDER: Emelia Loron, MD  REFERRING DIAG: s/p Right Breast Cancer  THERAPY DIAG:  Malignant neoplasm of overlapping sites of right breast in female, estrogen receptor positive  Stiffness of right shoulder, not elsewhere classified  Aftercare following surgery for neoplasm  ONSET DATE: November 2023  Rationale for  Evaluation and Treatment: Rehabilitation  SUBJECTIVE:                                                                                                                                                                                           SUBJECTIVE STATEMENT: I did 1 day of good stretches, and I did it a couple days at home. I think it was helpful, but I havent done anything yet today.  PERTINENT HISTORY:    R breast cancer, left outer quadrant, triple positive, Ki 67 - 60%, grade 2 -3 IDC, neoadjuvant chemo and immuno therapy,  right mastectomy on 11/28/2020  with 4 nodes removed on the right. She also had radiation ending 02/17/2021. and she had an excision of breast tissue on the left that was benign   PAIN:  Are you having pain? Not right now NPRS scale: 0/10 presently Pain location: Under mastectomy incision Pain orientation: Right  PAIN TYPE: sharp and tight Pain description: intermittent  Aggravating factors: reaching to night stand, when first starting to stretch its tight Relieving factors: not reaching  PRECAUTIONS: Right UE lymphedema risk  WEIGHT BEARING RESTRICTIONS: No  FALLS:  Has patient fallen in last 6 months? 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 A little restricted at lateral border, and more tender  SENSATION: Light touch: Deficits light numbness posterior right arm and axilla  POSTURE: forward head, rounded shoulders  UPPER EXTREMITY AROM/PROM:  A/PROM RIGHT   eval   Shoulder extension 60  Shoulder flexion 144  Shoulder abduction 163  Shoulder internal rotation 70  Shoulder external rotation 98    (Blank rows = not tested)  A/PROM LEFT   eval  Shoulder extension 58  Shoulder flexion 153  Shoulder abduction 170  Shoulder internal rotation 70  Shoulder external rotation 96    (Blank rows = not tested)  CERVICAL  AROM: All within normal limits:    UPPER EXTREMITY STRENGTH: WNL   LYMPHEDEMA ASSESSMENTS:   SURGERY TYPE/DATE: Right Mastectomy  11/28/2020  NUMBER OF LYMPH NODES REMOVED: 4  CHEMOTHERAPY: Yes  RADIATION:Yes  HORMONE TREATMENT: NO  INFECTIONS: NO  LYMPHEDEMA ASSESSMENTS:   LANDMARK RIGHT  eval  10 cm proximal to olecranon process 26.7  Olecranon process 23.6  10 cm proximal to ulnar styloid process 17.5  Just proximal to ulnar styloid process 14.4  Across hand at thumb web space 17.3  At base of 2nd digit 5.8  (Blank rows = not tested)  LANDMARK LEFT  eval  10 cm proximal to olecranon process 28.4  Olecranon process 24.3  10 cm proximal to ulnar styloid process 17.9  Just proximal to ulnar styloid process 14.6  Across hand at thumb web space 17.3  At base of 2nd digit 5.5  (Blank rows = not tested)      QUICK DASH SURVEY: 14%   TODAY'S TREATMENT:                                                                                                                                         DATE:  10/09/2022 Soft tissue mobilization to right UT, Pectorals, lateral trunk with cocoa butter Scar massage to mastectomy incision, MFR in different directions to right chest area Supine wand flexion and scap x 3-4 ea with 5 sec hold LTR x 5 with arms outstretched and goal post arms PROM right shoulder flexion, scaption, abduction Updated HEP with supine wand flex and scaption  10/01/2022  PATIENT EDUCATION:  10/10/2022: updated HEP with supine wand flexion and scaption Education details: pt to perform gentle stretches she is already doing but to add an additional session in the afternoon/evening. Educated in LTR with arms outstretched and and gave handout. Also educated in scar massage to mastectomy incision. Person educated: Patient Education method: Chief Technology [employer] Education comprehension: verbalized understanding and returned demonstration  HOME EXERCISE  PROGRAM: LTR with arms outstretched, stargazer, scar massage, supine wand flex and scap  ASSESSMENT:  CLINICAL IMPRESSION: Pt continues with tightness in Right pectorals and UT,  She felt looser after treatment today especially in the axillary border of pecs. Lower trunk rotation worked well to [employer].  Pt was tender at lateral mastectomy incision.  OBJECTIVE IMPAIRMENTS: decreased activity tolerance, decreased mobility, decreased ROM, impaired UE functional use, postural dysfunction, and pain.   ACTIVITY LIMITATIONS: reach over head and to the side  PARTICIPATION LIMITATIONS:  Pt is doing all that is required but is cautious with reaching  PERSONAL FACTORS: 1-2 comorbidities: Right breast triple positive Breast cancer s/p right mastectomy with chemo and radiation  are also affecting patient's functional outcome.   REHAB POTENTIAL: Good  CLINICAL DECISION MAKING: Stable/uncomplicated  EVALUATION COMPLEXITY: Low  GOALS: Goals reviewed with patient? Yes  SHORT TERM GOALS=LONG TERM GOALS: Target date: 5/202/2024  Pt will be independent in a HEP for gentle stretching of  mastectomy scar and right upper quarter to relieve pain Baseline: Goal  status: INITIAL  2.  Pt will note decreased tightness/pain by atleast 50% Baseline:  Goal status: INITIAL  3.  Pt will be able to reach to her nightstand without sharp pain Baseline:  Goal status: INITIAL  4.  Pts right shoulder flexion and abduction wiill be WNL Baseline: flex 144, abd 163 Goal status: INITIAL     PLAN:  PT FREQUENCY: 2x/week  PT DURATION: 6 weeks  PLANNED INTERVENTIONS: Therapeutic exercises, Therapeutic activity, Patient/Family education, Self Care, Joint mobilization, Orthotic/Fit training, Dry Needling, scar mobilization, Manual therapy, and Re-evaluation  PLAN FOR NEXT SESSION: STM to right UQ to decrease tissue tension, supine wand scaption, scar massage, MFR to right chest area, PROM Progress to  strength when ready. Pt will be travelling some (NO SOZO noted in chart due to plate in wrist)   Waynette Buttery, PT 10/09/2022, 3:03 PM

## 2022-10-09 NOTE — Patient Instructions (Signed)
SHOULDER: Flexion - Supine (Cane)        Cancer Rehab [id number]    Hold cane in both hands. Raise arms up overhead. Do not allow back to arch. Hold _5__ seconds. Do __5__ times; __2__ times a day.  Hands shoulder width Hands wider than shoulder width; (Y) position  Copyright  VHI. All rights reserved.

## 2022-10-10 DIAGNOSIS — C50911 Malignant neoplasm of unspecified site of right female breast: Secondary | ICD-10-CM | POA: Diagnosis not present

## 2022-10-11 ENCOUNTER — Ambulatory Visit: Payer: BC Managed Care – PPO

## 2022-10-11 DIAGNOSIS — Z483 Aftercare following surgery for neoplasm: Secondary | ICD-10-CM | POA: Diagnosis not present

## 2022-10-11 DIAGNOSIS — Z170 Estrogen receptor positive status [ER+]: Secondary | ICD-10-CM | POA: Diagnosis not present

## 2022-10-11 DIAGNOSIS — M25611 Stiffness of right shoulder, not elsewhere classified: Secondary | ICD-10-CM

## 2022-10-11 DIAGNOSIS — M62838 Other muscle spasm: Secondary | ICD-10-CM | POA: Diagnosis not present

## 2022-10-11 DIAGNOSIS — C50811 Malignant neoplasm of overlapping sites of right female breast: Secondary | ICD-10-CM | POA: Diagnosis not present

## 2022-10-11 NOTE — Therapy (Signed)
OUTPATIENT PHYSICAL THERAPY ONCOLOGY EVALUATION  Patient Name: Teresa Sheppard MRN: [id number] DOB:[birth year], 62 y.o., female Today's Date: 10/11/2022  END OF SESSION:  PT End of Session - 10/11/22 1402     Visit Number 3    Number of Visits 12    Date for PT Re-Evaluation 11/12/22    PT Start Time 1403    PT Stop Time 1455    PT Time Calculation (min) 52 min    Activity Tolerance Patient tolerated treatment well    Behavior During Therapy [facility] for tasks assessed/performed             Past Medical History:  Diagnosis Date   Anxiety    Breast cancer in female    Right   Family history of breast cancer 11/07/2020   Family history of pancreatic cancer 11/07/2020   History of kidney stones 2014-2015   Hyperthyroidism    No longer an issue   PONV (postoperative nausea and vomiting)    Thyromegaly    Past Surgical History:  Procedure Laterality Date   ABDOMINAL WALL DEFECT REPAIR     following hernia repair in 2010, no mesh was used   ABDOMINOPLASTY     with hernia repair   APPENDECTOMY     BOWEL RESECTION     2008  while pregnant with last child. Scar tissue from appendectomy wrapped around bowel and was snipped to free bowel.    CESAREAN SECTION     DILATION AND CURETTAGE OF UTERUS     2000 and 2014   HERNIA REPAIR     MASTECTOMY W/ SENTINEL NODE BIOPSY Right 11/28/2020   Procedure: RIGHT MASTECTOMY WITH RIGHT AXILLARY SENTINEL LYMPH NODE BIOPSY;  Surgeon: Emelia Loron, MD;  Location: [facility] OR;  Service: General;  Laterality: Right;   PORT-A-CATH REMOVAL Left 01/30/2021   Procedure: PORT REMOVAL;  Surgeon: Emelia Loron, MD;  Location: [facility] OR;  Service: General;  Laterality: Left;  LOCAL   PORTACATH PLACEMENT Left 07/12/2020   Procedure: INSERTION PORT-A-CATH WITH ULTRASOUND GUIDANCE;  Surgeon: Emelia Loron, MD;  Location: [facility] OR;  Service: General;  Laterality: Left;   RADIOACTIVE SEED GUIDED AXILLARY SENTINEL LYMPH NODE Right 11/28/2020   Procedure:  RADIOACTIVE SEED GUIDED RIGHT AXILLARY AXILLARY SENTINEL LYMPH NODE EXCISION;  Surgeon: Emelia Loron, MD;  Location: [facility] OR;  Service: General;  Laterality: Right;   RADIOACTIVE SEED GUIDED EXCISIONAL BREAST BIOPSY Left 11/28/2020   Procedure: RADIOACTIVE SEED GUIDED LEFT EXCISIONAL BREAST BIOPSY;  Surgeon: Emelia Loron, MD;  Location: [facility] OR;  Service: General;  Laterality: Left;   ROOT CANAL     2019   TONSILLECTOMY  1969   URETERAL EXPLORATION     dilataion for congenitally small ureter   Patient Active Problem List   Diagnosis Date Noted   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   Vitamin D deficiency 08/30/2021   Osteoporosis 08/30/2021   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   Genetic testing 11/15/2020   Family history of breast cancer 11/07/2020   Family history of pancreatic cancer 11/07/2020   Dysphagia 10/26/2020   Malignant neoplasm of overlapping sites of right breast in female, estrogen receptor positive 06/21/2020   Hyperthyroidism 03/28/2016      REFERRING PROVIDER: Emelia Loron, MD  REFERRING DIAG: s/p Right Breast Cancer  THERAPY DIAG:  Malignant neoplasm of overlapping sites of right breast in female, estrogen receptor positive  Stiffness of right shoulder, not elsewhere classified  Aftercare following surgery for neoplasm  ONSET DATE: November 2023  Rationale for  Evaluation and Treatment: Rehabilitation  SUBJECTIVE:                                                                                                                                                                                           SUBJECTIVE STATEMENT:  Everything was better after last visit, but with walking I felt a little pain in the chest area, but then it went away quickly. I went to my first live strong. My balance wasn't as good as usual.   PERTINENT HISTORY:    R breast cancer, left outer quadrant, triple positive, Ki 67 - 60%, grade 2 -3  IDC, neoadjuvant chemo and immuno therapy,  right mastectomy on 11/28/2020 with 4 nodes removed on the right. She also had radiation ending 02/17/2021. and she had an excision of breast tissue on the left that was benign   PAIN:  Are you having pain? Not right now NPRS scale: 0/10 presently Pain location: Under mastectomy incision Pain orientation: Right  PAIN TYPE: sharp and tight Pain description: intermittent  Aggravating factors: reaching to night stand, when first starting to stretch its tight Relieving factors: not reaching  PRECAUTIONS: Right UE lymphedema risk  WEIGHT BEARING RESTRICTIONS: No  FALLS:  Has patient fallen in last 6 months? 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 A little restricted at lateral border, and more tender  SENSATION: Light touch: Deficits light numbness posterior right arm and axilla  POSTURE: forward head, rounded shoulders  UPPER EXTREMITY AROM/PROM:  A/PROM RIGHT   eval   Shoulder extension 60  Shoulder flexion 144  Shoulder abduction 163  Shoulder internal rotation 70  Shoulder external rotation 98    (Blank rows = not tested)  A/PROM LEFT   eval  Shoulder extension 58  Shoulder flexion 153  Shoulder abduction 170  Shoulder internal rotation 70   Shoulder external rotation 96    (Blank rows = not tested)  CERVICAL AROM: All within normal limits:    UPPER EXTREMITY  STRENGTH: WNL   LYMPHEDEMA ASSESSMENTS:   SURGERY TYPE/DATE: Right Mastectomy 11/28/2020  NUMBER OF LYMPH NODES REMOVED: 4  CHEMOTHERAPY: Yes  RADIATION:Yes  HORMONE TREATMENT: NO  INFECTIONS: NO  LYMPHEDEMA ASSESSMENTS:   LANDMARK RIGHT  eval  10 cm proximal to olecranon process 26.7  Olecranon process 23.6  10 cm proximal to ulnar styloid process 17.5  Just proximal to ulnar styloid process 14.4  Across hand at thumb web space 17.3  At base of 2nd digit 5.8  (Blank rows = not tested)  LANDMARK LEFT  eval  10 cm proximal to olecranon process 28.4  Olecranon process 24.3  10 cm proximal to ulnar styloid process 17.9  Just proximal to ulnar styloid process 14.6  Across hand at thumb web space 17.3  At base of 2nd digit 5.5  (Blank rows = not tested)      QUICK DASH SURVEY: 14%   TODAY'S TREATMENT:                                                                                                                                         DATE:   10/11/2022 Soft tissue mobilization to right UT, Pectorals, lateral trunk with cocoa butter Scar massage to mastectomy incision, MFR in different directions to right chest area Supine wand flexion and scap x4 ea with 5 sec hold AROM bilateral UE's x 5 for flexion, scaption, abduction Open book stretch x 5 PROM right shoulder flexion, scaption, abduction Pectoralis doorway stretch single and double arm x 2 Pt to simulate reaching to night stand to see if she has improved.   10/09/2022 Soft tissue mobilization to right UT, Pectorals, lateral trunk with cocoa butter Scar massage to mastectomy incision, MFR in different directions to right chest area Supine wand flexion and scap x 3-4 ea with 5 sec hold LTR x 5 with arms outstretched and goal post arms PROM right shoulder flexion, scaption,  abduction Updated HEP with supine wand flex and scaption  10/01/2022  PATIENT EDUCATION:  10/10/2022: updated HEP with supine wand flexion and scaption Education details: pt to perform gentle stretches she is already doing but to add an additional session in the afternoon/evening. Educated in LTR with arms outstretched and and gave handout. Also educated in scar massage to mastectomy incision. Person educated: Patient Education method: Chief Technology Officer Education comprehension: verbalized understanding and returned demonstration  HOME EXERCISE PROGRAM: LTR with arms outstretched, stargazer, scar massage, supine wand flex and scap  ASSESSMENT:  CLINICAL IMPRESSION: Continued STM and MFR and PROM.  Added several different stretches. Pt feels looser after treatment but continues with noted tightness especially at axillary border of pecs. OBJECTIVE IMPAIRMENTS: decreased activity tolerance, decreased mobility, decreased ROM, impaired UE functional use, postural dysfunction, and pain.   ACTIVITY LIMITATIONS: reach over head and to the side  PARTICIPATION LIMITATIONS:  Pt is doing all that is required but is cautious  with reaching  PERSONAL FACTORS: 1-2 comorbidities: Right breast triple positive Breast cancer s/p right mastectomy with chemo and radiation  are also affecting patient's functional outcome.   REHAB POTENTIAL: Good  CLINICAL DECISION MAKING: Stable/uncomplicated  EVALUATION COMPLEXITY: Low  GOALS: Goals reviewed with patient? Yes  SHORT TERM GOALS=LONG TERM GOALS: Target date: 5/202/2024  Pt will be independent in a HEP for gentle stretching of  mastectomy scar and right upper quarter to relieve pain Baseline: Goal status: INITIAL  2.  Pt will note decreased tightness/pain by atleast 50% Baseline:  Goal status: INITIAL  3.  Pt will be able to reach to her nightstand without sharp pain Baseline:  Goal status: INITIAL  4.  Pts right shoulder flexion and  abduction wiill be WNL Baseline: flex 144, abd 163 Goal status: INITIAL     PLAN:  PT FREQUENCY: 2x/week  PT DURATION: 6 weeks  PLANNED INTERVENTIONS: Therapeutic exercises, Therapeutic activity, Patient/Family education, Self Care, Joint mobilization, Orthotic/Fit training, Dry Needling, scar mobilization, Manual therapy, and Re-evaluation  PLAN FOR NEXT SESSION: STM to right UQ to decrease tissue tension, supine wand scaption, scar massage, MFR to right chest area, PROM Progress to strength when ready. Pt will be travelling some (NO SOZO noted in chart due to plate in wrist)   Waynette Buttery, PT 10/11/2022, 2:57 PM

## 2022-10-12 NOTE — Progress Notes (Unsigned)
[facility] Return Visit  SUBJECTIVE  History of Present Illness: ZOOEY SCHREURS is a 62 y.o. female seen in follow-up for OAB. Plan at last visit was start Vesicare  .   Patient did not start the Vesicare as she read about the side effect profile and she already struggles with dry mouth and was concerned about this.   Patient is up 4-6 times nightly to urinate and has increased urge. She reports when she follows the IC diet plan strictly it helps some but she is still up 3-4 times nightly.    Past Medical History: Patient  has a past medical history of Anxiety, Breast cancer in female, Family history of breast cancer (11/07/2020), Family history of pancreatic cancer (11/07/2020), History of kidney stones (2014-2015), Hyperthyroidism, PONV (postoperative nausea and vomiting), and Thyromegaly.   Past Surgical History: She  has a past surgical history that includes Dilation and curettage of uterus; Bowel resection; Ureteral exploration; Appendectomy; Cesarean section; Abdominal wall defect repair; Root canal; Hernia repair; Portacath placement (Left, 07/12/2020); Tonsillectomy (1969); Mastectomy w/ sentinel node biopsy (Right, 11/28/2020); Radioactive seed guided axillary sentinel lymph node (Right, 11/28/2020); Radioactive seed guided excisional breast biopsy (Left, 11/28/2020); Port-a-cath removal (Left, 01/30/2021); and Abdominoplasty.   Medications: She has a current medication list which includes the following prescription(s): acetaminophen, biotin w/ vitamins c & e, [employer]vs calcium soft chews, cholecalciferol, d-mannose, estradiol, lactobacillus-inulin, letrozole, vaginal moisturizer, mirabegron er, multiple vitamins-minerals, neratinib maleate, polyethyl glycol-propyl glycol, and vitamin e.   Allergies: Patient is allergic to shellfish-derived products, ciprofibrate, propylthiouracil, shellfish allergy, shrimp extract, hydrocodone, and penicillins.   Social  History: Patient  reports that she has never smoked. She has never used smokeless tobacco. She reports current alcohol use. She reports that she does not use drugs.      OBJECTIVE     Physical Exam: Vitals:   10/15/22 1113  BP: (!) 117/58  Pulse: (!) 54   Gen: No apparent distress, A&O x 3.  Detailed Urogynecologic Evaluation:  Deferred.    ASSESSMENT AND PLAN    Ms. Seeman is a 62 y.o. with:  1. Overactive bladder   2. Levator spasm   3. Urinary frequency   4. Nocturia    Start Myrbetriq 25 mg daily for overactive bladder. For Beta-3 agonist medication, we discussed the potential side effect of elevated blood pressure which is more likely to occur in individuals with uncontrolled hypertension.  Patient wants to try PT for her levator spasms. She reports she has done Pelvic floor PT in the past and is hopeful it can be helpful.  We discussed not sipping fluids and being intentional in drinking throughout the day. She reports she will try this.  Will try medication, drinking fluids intentionally, and pelvic PT.   Patient to follow up in 6 weeks or sooner if needed.

## 2022-10-15 ENCOUNTER — Encounter: Payer: Self-pay | Admitting: Obstetrics and Gynecology

## 2022-10-15 ENCOUNTER — Ambulatory Visit: Payer: BC Managed Care – PPO | Admitting: Obstetrics and Gynecology

## 2022-10-15 VITALS — BP 117/58 | HR 54

## 2022-10-15 DIAGNOSIS — R351 Nocturia: Secondary | ICD-10-CM

## 2022-10-15 DIAGNOSIS — N3281 Overactive bladder: Secondary | ICD-10-CM

## 2022-10-15 DIAGNOSIS — R350 Frequency of micturition: Secondary | ICD-10-CM | POA: Diagnosis not present

## 2022-10-15 DIAGNOSIS — M62838 Other muscle spasm: Secondary | ICD-10-CM | POA: Diagnosis not present

## 2022-10-15 MED ORDER — MIRABEGRON ER 25 MG PO TB24
25.0000 mg | ORAL_TABLET | Freq: Every day | ORAL | 5 refills | Status: DC
Start: 2022-10-15 — End: 2023-04-17

## 2022-10-15 NOTE — Patient Instructions (Addendum)
Let's try the Myrbetriq  for you as the side effect profile is less than those in the anticholinergic family.   Be intentional with your drinking. Do not sip your fluids.

## 2022-10-16 ENCOUNTER — Other Ambulatory Visit: Payer: Self-pay

## 2022-10-16 ENCOUNTER — Encounter: Payer: BC Managed Care – PPO | Attending: Obstetrics and Gynecology | Admitting: Physical Therapy

## 2022-10-16 ENCOUNTER — Encounter: Payer: Self-pay | Admitting: Hematology and Oncology

## 2022-10-16 ENCOUNTER — Encounter: Payer: Self-pay | Admitting: Physical Therapy

## 2022-10-16 ENCOUNTER — Ambulatory Visit: Payer: BC Managed Care – PPO

## 2022-10-16 DIAGNOSIS — C50911 Malignant neoplasm of unspecified site of right female breast: Secondary | ICD-10-CM | POA: Diagnosis not present

## 2022-10-16 DIAGNOSIS — M6281 Muscle weakness (generalized): Secondary | ICD-10-CM | POA: Insufficient documentation

## 2022-10-16 DIAGNOSIS — C50811 Malignant neoplasm of overlapping sites of right female breast: Secondary | ICD-10-CM | POA: Diagnosis not present

## 2022-10-16 DIAGNOSIS — M62838 Other muscle spasm: Secondary | ICD-10-CM | POA: Diagnosis not present

## 2022-10-16 DIAGNOSIS — Z170 Estrogen receptor positive status [ER+]: Secondary | ICD-10-CM | POA: Diagnosis not present

## 2022-10-16 DIAGNOSIS — Z483 Aftercare following surgery for neoplasm: Secondary | ICD-10-CM | POA: Diagnosis not present

## 2022-10-16 DIAGNOSIS — M25611 Stiffness of right shoulder, not elsewhere classified: Secondary | ICD-10-CM

## 2022-10-16 NOTE — Therapy (Signed)
OUTPATIENT PHYSICAL THERAPY ONCOLOGY EVALUATION  Patient Name: Teresa Sheppard MRN: [id number] DOB:22-Jul-1960, 62 y.o., female Today's Date: 10/16/2022  END OF SESSION:  PT End of Session - 10/16/22 1405     Visit Number 4    Number of Visits 12    Date for PT Re-Evaluation 11/12/22    Authorization Type BCBS    Authorization - Visit Number 4   cancer   PT Start Time 1406    PT Stop Time 1455    PT Time Calculation (min) 49 min    Activity Tolerance Patient tolerated treatment well    Behavior During Therapy [facility] for tasks assessed/performed              Past Medical History:  Diagnosis Date   Anxiety    Breast cancer in female    Right   Family history of breast cancer 11/07/2020   Family history of pancreatic cancer 11/07/2020   History of kidney stones 2014-2015   Hyperthyroidism    No longer an issue   PONV (postoperative nausea and vomiting)    Thyromegaly    Past Surgical History:  Procedure Laterality Date   ABDOMINAL WALL DEFECT REPAIR     following hernia repair in 2010, no mesh was used   ABDOMINOPLASTY     with hernia repair   APPENDECTOMY     BOWEL RESECTION     2008  while pregnant with last child. Scar tissue from appendectomy wrapped around bowel and was snipped to free bowel.    CESAREAN SECTION     DILATION AND CURETTAGE OF UTERUS     2000 and 2014   HERNIA REPAIR     MASTECTOMY W/ SENTINEL NODE BIOPSY Right 11/28/2020   Procedure: RIGHT MASTECTOMY WITH RIGHT AXILLARY SENTINEL LYMPH NODE BIOPSY;  Surgeon: Emelia Loron, MD;  Location: [facility] OR;  Service: General;  Laterality: Right;   PORT-A-CATH REMOVAL Left 01/30/2021   Procedure: PORT REMOVAL;  Surgeon: Emelia Loron, MD;  Location: [facility] OR;  Service: General;  Laterality: Left;  LOCAL   PORTACATH PLACEMENT Left 07/12/2020   Procedure: INSERTION PORT-A-CATH WITH ULTRASOUND GUIDANCE;  Surgeon: Emelia Loron, MD;  Location: [facility] OR;  Service: General;  Laterality: Left;    RADIOACTIVE SEED GUIDED AXILLARY SENTINEL LYMPH NODE Right 11/28/2020   Procedure: RADIOACTIVE SEED GUIDED RIGHT AXILLARY AXILLARY SENTINEL LYMPH NODE EXCISION;  Surgeon: Emelia Loron, MD;  Location: [facility] OR;  Service: General;  Laterality: Right;   RADIOACTIVE SEED GUIDED EXCISIONAL BREAST BIOPSY Left 11/28/2020   Procedure: RADIOACTIVE SEED GUIDED LEFT EXCISIONAL BREAST BIOPSY;  Surgeon: Emelia Loron, MD;  Location: [facility] OR;  Service: General;  Laterality: Left;   ROOT CANAL     2019   TONSILLECTOMY  1969   URETERAL EXPLORATION     dilataion for congenitally small ureter   Patient Active Problem List   Diagnosis Date Noted   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   Vitamin D deficiency 08/30/2021   Osteoporosis 08/30/2021   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   Genetic testing 11/15/2020   Family history of breast cancer 11/07/2020   Family history of pancreatic cancer 11/07/2020   Dysphagia 10/26/2020   Malignant neoplasm of overlapping sites of right breast in female, estrogen receptor positive 06/21/2020   Hyperthyroidism 03/28/2016      REFERRING PROVIDER: Emelia Loron, MD  REFERRING DIAG: s/p Right Breast Cancer  THERAPY DIAG:  Aftercare following surgery for neoplasm  Stiffness of right shoulder, not elsewhere classified  Malignant neoplasm of overlapping  sites of right breast in female, estrogen receptor positive  ONSET DATE: November 2023  Rationale for Evaluation and Treatment: Rehabilitation  SUBJECTIVE:                                                                                                                                                                                           SUBJECTIVE STATEMENT:  I did do exs twice yesterday, and I will do it today too.  I found an umbrella to do the stretches. Haven't had any sharp pains this week in my chest area. I went to [employer] yesterday and did OK   PERTINENT HISTORY:     R breast cancer, left outer quadrant, triple positive, Ki 67 - 60%, grade 2 -3 IDC, neoadjuvant chemo and immuno therapy,  right mastectomy on 11/28/2020 with 4 nodes removed on the right. She also had radiation ending 02/17/2021. and she had an excision of breast tissue on the left that was benign   PAIN:  Are you having pain? Not right now NPRS scale: 0/10 presently Pain location: Under mastectomy incision Pain orientation: Right  PAIN TYPE: sharp and tight Pain description: intermittent  Aggravating factors: reaching to night stand, when first starting to stretch its tight Relieving factors: not reaching  PRECAUTIONS: Right UE lymphedema risk  WEIGHT BEARING RESTRICTIONS: No  FALLS:  Has patient fallen in last 6 months? 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 A little restricted at lateral border, and more tender  SENSATION: Light touch: Deficits light numbness posterior right arm and axilla  POSTURE: forward head, rounded shoulders  UPPER EXTREMITY AROM/PROM:  A/PROM RIGHT   eval   Shoulder extension 60  Shoulder flexion 144  Shoulder abduction 163  Shoulder internal rotation 70  Shoulder external rotation 98    (Blank rows = not tested)  A/PROM LEFT   eval  Shoulder extension 58   Shoulder flexion 153  Shoulder abduction 170  Shoulder internal rotation 70  Shoulder external rotation 96    (  Blank rows = not tested)  CERVICAL AROM: All within normal limits:    UPPER EXTREMITY STRENGTH: WNL   LYMPHEDEMA ASSESSMENTS:   SURGERY TYPE/DATE: Right Mastectomy 11/28/2020  NUMBER OF LYMPH NODES REMOVED: 4  CHEMOTHERAPY: Yes  RADIATION:Yes  HORMONE TREATMENT: NO  INFECTIONS: NO  LYMPHEDEMA ASSESSMENTS:   LANDMARK RIGHT  eval  10 cm proximal to olecranon process 26.7  Olecranon process 23.6  10 cm proximal to ulnar styloid process 17.5  Just proximal to ulnar styloid process 14.4  Across hand at thumb web space 17.3  At base of 2nd digit 5.8  (Blank rows = not tested)  LANDMARK LEFT  eval  10 cm proximal to olecranon process 28.4  Olecranon process 24.3  10 cm proximal to ulnar styloid process 17.9  Just proximal to ulnar styloid process 14.6  Across hand at thumb web space 17.3  At base of 2nd digit 5.5  (Blank rows = not tested)      QUICK DASH SURVEY: 14%   TODAY'S TREATMENT:                                                                                                                                         DATE:   10/16/2022 Soft tissue mobilization to right UT, Pectorals, lateral trunk with cocoa butter Scar massage to mastectomy incision, MFR in different directions to right chest area LTR x 4 ea side with arms outstretched and goal post arms Open book stretch x 5 PROM right shoulder flexion, scaption, abduction Reaching for cones to far right 2 x 4, and to far left reaching with Right UE to simulate reach to night stand 2 x 4. Able to do without increased pain in chest.    10/11/2022 Soft tissue mobilization to right UT, Pectorals, lateral trunk with cocoa butter Scar massage to mastectomy incision, MFR in different directions to right chest area Supine wand flexion and scap x4 ea with 5 sec hold AROM bilateral UE's x 5 for  flexion, scaption, abduction Open book stretch x 5 PROM right shoulder flexion, scaption, abduction Pectoralis doorway stretch single and double arm x 2 Pt to simulate reaching to night stand to see if she has improved.   10/09/2022 Soft tissue mobilization to right UT, Pectorals, lateral trunk with cocoa butter Scar massage to mastectomy incision, MFR in different directions to right chest area Supine wand flexion and scap x 3-4 ea with 5 sec hold LTR x 5 with arms outstretched and goal post arms PROM right shoulder flexion, scaption, abduction Updated HEP with supine wand flex and scaption  10/01/2022  PATIENT EDUCATION:  10/10/2022: updated HEP with supine wand flexion and scaption Education details: pt to perform gentle stretches she is already doing but to add an additional session in the afternoon/evening. Educated in LTR with arms outstretched and and gave handout. Also educated in scar massage to mastectomy incision. Person educated:  Patient Education method: Explanation and Handouts Education comprehension: verbalized understanding and returned demonstration  HOME EXERCISE PROGRAM: LTR with arms outstretched, stargazer, scar massage, supine wand flex and scap  ASSESSMENT:  CLINICAL IMPRESSION: Continued STM and MFR and PROM.  Added reaching for cones to each side using the right UE without increased pain. Lateral mastectomy incision still tight and tender. Will consider cupping for next visit. OBJECTIVE IMPAIRMENTS: decreased activity tolerance, decreased mobility, decreased ROM, impaired UE functional use, postural dysfunction, and pain.   ACTIVITY LIMITATIONS: reach over head and to the side  PARTICIPATION LIMITATIONS:  Pt is doing all that is required but is cautious with reaching  PERSONAL FACTORS: 1-2 comorbidities: Right breast triple positive Breast cancer s/p right mastectomy with chemo and radiation  are also affecting patient's functional outcome.   REHAB  POTENTIAL: Good  CLINICAL DECISION MAKING: Stable/uncomplicated  EVALUATION COMPLEXITY: Low  GOALS: Goals reviewed with patient? Yes  SHORT TERM GOALS=LONG TERM GOALS: Target date: 5/202/2024  Pt will be independent in a HEP for gentle stretching of  mastectomy scar and right upper quarter to relieve pain Baseline: Goal status: INITIAL  2.  Pt will note decreased tightness/pain by atleast 50% Baseline:  Goal status: INITIAL  3.  Pt will be able to reach to her nightstand without sharp pain Baseline:  Goal status: INITIAL  4.  Pts right shoulder flexion and abduction wiill be WNL Baseline: flex 144, abd 163 Goal status: INITIAL     PLAN:  PT FREQUENCY: 2x/week  PT DURATION: 6 weeks  PLANNED INTERVENTIONS: Therapeutic exercises, Therapeutic activity, Patient/Family education, Self Care, Joint mobilization, Orthotic/Fit training, Dry Needling, scar mobilization, Manual therapy, and Re-evaluation  PLAN FOR NEXT SESSION: consider cupping around incision,STM to right UQ to decrease tissue tension, supine wand scaption, scar massage, MFR to right chest area, PROM Progress to strength when ready. Pt will be travelling some (NO SOZO noted in chart due to plate in wrist)   Waynette Buttery, PT 10/16/2022, 2:56 PM

## 2022-10-16 NOTE — Therapy (Addendum)
OUTPATIENT PHYSICAL THERAPY FEMALE PELVIC EVALUATION   Patient Name: Teresa Sheppard MRN: [id number] DOB:27-Sep-1960, 62 y.o., female Today's Date: 10/16/2022  END OF SESSION:  PT End of Session - 10/16/22 1048     Visit Number 1    Date for PT Re-Evaluation 01/08/23    Authorization Type BCBS    Authorization - Visit Number 4   cancer and pelvic   Authorization - Number of Visits 30    PT Start Time 1030    PT Stop Time 1115    PT Time Calculation (min) 45 min    Activity Tolerance Patient tolerated treatment well    Behavior During Therapy WFL for tasks assessed/performed             Past Medical History:  Diagnosis Date   Anxiety    Breast cancer in female    Right   Family history of breast cancer 11/07/2020   Family history of pancreatic cancer 11/07/2020   History of kidney stones 2014-2015   Hyperthyroidism    No longer an issue   PONV (postoperative nausea and vomiting)    Thyromegaly    Past Surgical History:  Procedure Laterality Date   ABDOMINAL WALL DEFECT REPAIR     following hernia repair in 2010, no mesh was used   ABDOMINOPLASTY     with hernia repair   APPENDECTOMY     BOWEL RESECTION     2008  while pregnant with last child. Scar tissue from appendectomy wrapped around bowel and was snipped to free bowel.    CESAREAN SECTION     DILATION AND CURETTAGE OF UTERUS     2000 and 2014   HERNIA REPAIR     MASTECTOMY W/ SENTINEL NODE BIOPSY Right 11/28/2020   Procedure: RIGHT MASTECTOMY WITH RIGHT AXILLARY SENTINEL LYMPH NODE BIOPSY;  Surgeon: Emelia Loron, MD;  Location: [facility] OR;  Service: General;  Laterality: Right;   PORT-A-CATH REMOVAL Left 01/30/2021   Procedure: PORT REMOVAL;  Surgeon: Emelia Loron, MD;  Location: [facility] OR;  Service: General;  Laterality: Left;  LOCAL   PORTACATH PLACEMENT Left 07/12/2020   Procedure: INSERTION PORT-A-CATH WITH ULTRASOUND GUIDANCE;  Surgeon: Emelia Loron, MD;  Location: [facility] OR;  Service: General;   Laterality: Left;   RADIOACTIVE SEED GUIDED AXILLARY SENTINEL LYMPH NODE Right 11/28/2020   Procedure: RADIOACTIVE SEED GUIDED RIGHT AXILLARY AXILLARY SENTINEL LYMPH NODE EXCISION;  Surgeon: Emelia Loron, MD;  Location: [facility] OR;  Service: General;  Laterality: Right;   RADIOACTIVE SEED GUIDED EXCISIONAL BREAST BIOPSY Left 11/28/2020   Procedure: RADIOACTIVE SEED GUIDED LEFT EXCISIONAL BREAST BIOPSY;  Surgeon: Emelia Loron, MD;  Location: [facility] OR;  Service: General;  Laterality: Left;   ROOT CANAL     2019   TONSILLECTOMY  1969   URETERAL EXPLORATION     dilataion for congenitally small ureter   Patient Active Problem List   Diagnosis Date Noted   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   Vitamin D deficiency 08/30/2021   Osteoporosis 08/30/2021   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   Genetic testing 11/15/2020   Family history of breast cancer 11/07/2020   Family history of pancreatic cancer 11/07/2020   Dysphagia 10/26/2020   Malignant neoplasm of overlapping sites of right breast in female, estrogen receptor positive 06/21/2020   Hyperthyroidism 03/28/2016    PCP: Georgann Housekeeper, MD  REFERRING PROVIDER: Selmer Dominion, NP   REFERRING DIAG:  N32.81 (ICD-10-CM) - Overactive bladder  [phone] (ICD-10-CM) - Levator spasm    THERAPY  DIAG:  Other muscle spasm Z61,096  Muscle weakness ( generalized) M62.81  Aftercare following surgery for neoplasm Z48.3  Eulis Foster, PT 10/23/22 10:49 AM  Rationale for Evaluation and Treatment: Rehabilitation  ONSET DATE: 2023  SUBJECTIVE:                                                                                                                                                                                           SUBJECTIVE STATEMENT: 4 UTI in the past year. I am feeling more irritation than usual. Main problem is waking up in the middle of the night to go to the bathroom.  Fluid intake: Yes: water,  green tea, herbal teas    PAIN:  Are you having pain? Yes NPRS scale: 3/10 Pain location: External and opening, suprapubic  Pain type: aching and shooting  Pain description: intermittent   Aggravating factors: food Relieving factors: drink more water, pelvic floor relaxation  PRECAUTIONS: Other: breast cancer  WEIGHT BEARING RESTRICTIONS: No  FALLS:  Has patient fallen in last 6 months? No  LIVING ENVIRONMENT: Lives with: lives with their family  OCCUPATION: sitting  PLOF: Independent  PATIENT GOALS: reduce the number of times going to the bathroom  PERTINENT HISTORY:  Dilation and curettage of uterus; Bowel resection; Ureteral exploration; Appendectomy; Cesarean section; Abdominal wall defect repair; Hernia repair; Portacath placement (Left, 07/12/2020); Mastectomy w/ sentinel node biopsy (Right, 11/28/2020); Radioactive seed guided axillary sentinel lymph node (Right, 11/28/2020); Radioactive seed guided excisional breast biopsy (Left, 11/28/2020); Port-a-cath removal (Left, 01/30/2021); and Abdominoplasty. Breast cancer   BOWEL MOVEMENT: no issues with bowel movements Pain with bowel movement: No  URINATION: Pain with urination: No Fully empty bladder: Yes: no because 15 minutes later may have to go again Stream: Weak Urgency: Yes: at night and not as much during the day Frequency: night time voids 6-8 times; during the day goes every 2-3 hours Leakage:  none Pads: No  INTERCOURSE: not sexually active due to the pain   PREGNANCY: Vaginal deliveries 3 Tearing Yes:   C-section deliveries 1 Currently pregnant No  PROLAPSE: None   OBJECTIVE:   DIAGNOSTIC FINDINGS:  none   COGNITION: Overall cognitive status: Within functional limits for tasks assessed     SENSATION: Light touch: Appears intact Proprioception: Appears intact   POSTURE: No Significant postural limitations  PELVIC ALIGNMENT:  LUMBARAROM/PROM:  A/PROM A/PROM  eval  Flexion  full  Extension Decreased by 50%  Right lateral flexion Decreased by 25%  Left lateral flexion Decreased by 25%  Right rotation Decreased by 25%  Left rotation Decreased by 25%   (Blank rows = not tested)  LOWER EXTREMITY ROM:  Passive ROM Right eval Left eval  Hip external rotation 40 45   (Blank rows = not tested)  LOWER EXTREMITY MMT:  MMT Right eval Left eval  Hip flexion 4/5 4/5  Hip extension 3/5 3/5  Hip abduction 3/5 3/5  Hip adduction 4/5 4/5   PALPATION:   General  decreased movement of the lower rib cage and abdominal tightness                External Perineal Exam firmness of the perineal tissue                             Internal Pelvic Floor tenderness in right obturator internist, along the sides of the urethra and bladder, iliococcygeus  Patient confirms identification and approves PT to assess internal pelvic floor and treatment Yes  PELVIC MMT:   MMT eval  Vaginal 3/5  (Blank rows = not tested)        TONE: increased  PROLAPSE: none  TODAY'S TREATMENT:                                                                                                                              DATE: 10/16/22  EVAL see below   PATIENT EDUCATION:  Education details: educated patient on vaginal moisturizers,how to put them on the vaginal tissue Person educated: Patient Education method: Explanation Education comprehension: verbalized understanding  HOME EXERCISE PROGRAM: See above  ASSESSMENT:  CLINICAL IMPRESSION: Patient is a 63 y.o. female who was seen today for physical therapy evaluation and treatment for overactive bladder and levator spasm. Patient reports 3/10 pain in the vaginal area and suprapubic area. She has an irritation like a UTI in the area. Patient will go to the bathroom 6-8 times per night due to urgency.  During the day she will go every 2-3 hours. Patient will have to urinate 15 minutes after urinating sometimes. She has had 4 UTI's  in the past year. She has firmness of the perineal area. She has tenderness in the right obturator internist, sides of the bladder and urethra and iliococcygeus. Pelvic floor strength is 3/5. She has decreased lumbar ROM, weakness in hips, and decreased hip external rotation. Patient will benefit from skilled therapy to reduce pelvic floor tone and trigger points so she is able to relax her pelvic floor and reduce irritation.   OBJECTIVE IMPAIRMENTS: decreased ROM, decreased strength, increased fascial restrictions, increased muscle spasms, and pain.   ACTIVITY LIMITATIONS: toileting  PARTICIPATION LIMITATIONS: community activity  PERSONAL FACTORS: Age and 3+ comorbidities: Dilation and curettage of uterus; Bowel resection; Ureteral exploration; Appendectomy; Cesarean section; Abdominal wall defect repair; Hernia repair; Portacath placement (Left, 07/12/2020); Mastectomy w/ sentinel node biopsy (Right, 11/28/2020); Radioactive seed guided axillary sentinel lymph node (Right, 11/28/2020); Radioactive seed guided excisional breast biopsy (Left, 11/28/2020); Port-a-cath removal (Left, 01/30/2021); and Abdominoplasty. Breast cancer  are also affecting patient's  functional outcome.   REHAB POTENTIAL: Good  CLINICAL DECISION MAKING: Stable/uncomplicated  EVALUATION COMPLEXITY: Low   GOALS: Goals reviewed with patient? Yes  SHORT TERM GOALS: Target date: 11/07/22  Patient understands how to use vaginal moisturizers to improve the tissue health.  Baseline: Goal status: INITIAL  2.  Patient reports her vaginal irritation decreased >/= 25%.  Baseline:  Goal status: INITIAL  3.  Patient educated on vaginal wand to reduce there trigger points of the pelvic floor.  Baseline:  Goal status: INITIAL  4.  Patient is able to perform pelvic floor drop with diaphragmatic breathing.  Baseline:  Goal status: INITIAL   LONG TERM GOALS: Target date: 01/07/23  Patient independent with advanced HEP for  pelvic floor relaxation and core strength.  Baseline:  Goal status: INITIAL  2.  Patient wakes up </= 2 times per night due to reduction of urgency.  Baseline:  Goal status: INITIAL  3.  Patient is able to fully empty her bladder and not urinate again in 15 minutes due to the ability to fully relax her bladder.  Baseline:  Goal status: INITIAL  4.  Patient understands how to manage irritation of the bladder with relaxation and manual exercises.  Baseline:  Goal status: INITIAL    PLAN:  PT FREQUENCY: 1x/week  PT DURATION: 12 weeks  PLANNED INTERVENTIONS: Therapeutic exercises, Therapeutic activity, Neuromuscular re-education, Patient/Family education, Joint mobilization, Dry Needling, Electrical stimulation, Cryotherapy, Moist heat, Ultrasound, Biofeedback, and Manual therapy  PLAN FOR NEXT SESSION: release of the diaphragm, work on expanding the lower rib cage and diaphragmatic breathing, sitting on foam roll to massage the pelvic floor, hip stretches to release the pelvic floor   Eulis Foster, PT 10/16/22 12:34 PM

## 2022-10-18 ENCOUNTER — Ambulatory Visit: Payer: BC Managed Care – PPO

## 2022-10-18 ENCOUNTER — Inpatient Hospital Stay: Payer: BC Managed Care – PPO | Admitting: Hematology and Oncology

## 2022-10-18 ENCOUNTER — Inpatient Hospital Stay: Payer: BC Managed Care – PPO

## 2022-10-18 DIAGNOSIS — M25611 Stiffness of right shoulder, not elsewhere classified: Secondary | ICD-10-CM

## 2022-10-18 DIAGNOSIS — Z170 Estrogen receptor positive status [ER+]: Secondary | ICD-10-CM | POA: Diagnosis not present

## 2022-10-18 DIAGNOSIS — M62838 Other muscle spasm: Secondary | ICD-10-CM | POA: Diagnosis not present

## 2022-10-18 DIAGNOSIS — Z483 Aftercare following surgery for neoplasm: Secondary | ICD-10-CM | POA: Diagnosis not present

## 2022-10-18 DIAGNOSIS — C50811 Malignant neoplasm of overlapping sites of right female breast: Secondary | ICD-10-CM | POA: Diagnosis not present

## 2022-10-18 DIAGNOSIS — C50911 Malignant neoplasm of unspecified site of right female breast: Secondary | ICD-10-CM | POA: Diagnosis not present

## 2022-10-18 NOTE — Therapy (Signed)
OUTPATIENT PHYSICAL THERAPY ONCOLOGY EVALUATION  Patient Name: Teresa Sheppard MRN: [id number] DOB:1961-04-25, 62 y.o., female Today's Date: 10/18/2022  END OF SESSION:  PT End of Session - 10/18/22 1603     Visit Number 5   cancer, 1 pelvic   Number of Visits 12    Date for PT Re-Evaluation 11/12/22    Authorization Type BCBS    Authorization - Visit Number 5    PT Start Time 1604    PT Stop Time 1656    PT Time Calculation (min) 52 min    Activity Tolerance Patient tolerated treatment well    Behavior During Therapy WFL for tasks assessed/performed              Past Medical History:  Diagnosis Date   Anxiety    Breast cancer in female    Right   Family history of breast cancer 11/07/2020   Family history of pancreatic cancer 11/07/2020   History of kidney stones 2014-2015   Hyperthyroidism    No longer an issue   PONV (postoperative nausea and vomiting)    Thyromegaly    Past Surgical History:  Procedure Laterality Date   ABDOMINAL WALL DEFECT REPAIR     following hernia repair in 2010, no mesh was used   ABDOMINOPLASTY     with hernia repair   APPENDECTOMY     BOWEL RESECTION     2008  while pregnant with last child. Scar tissue from appendectomy wrapped around bowel and was snipped to free bowel.    CESAREAN SECTION     DILATION AND CURETTAGE OF UTERUS     2000 and 2014   HERNIA REPAIR     MASTECTOMY W/ SENTINEL NODE BIOPSY Right 11/28/2020   Procedure: RIGHT MASTECTOMY WITH RIGHT AXILLARY SENTINEL LYMPH NODE BIOPSY;  Surgeon: Emelia Loron, MD;  Location: [facility] OR;  Service: General;  Laterality: Right;   PORT-A-CATH REMOVAL Left 01/30/2021   Procedure: PORT REMOVAL;  Surgeon: Emelia Loron, MD;  Location: [facility] OR;  Service: General;  Laterality: Left;  LOCAL   PORTACATH PLACEMENT Left 07/12/2020   Procedure: INSERTION PORT-A-CATH WITH ULTRASOUND GUIDANCE;  Surgeon: Emelia Loron, MD;  Location: [facility] OR;  Service: General;  Laterality: Left;    RADIOACTIVE SEED GUIDED AXILLARY SENTINEL LYMPH NODE Right 11/28/2020   Procedure: RADIOACTIVE SEED GUIDED RIGHT AXILLARY AXILLARY SENTINEL LYMPH NODE EXCISION;  Surgeon: Emelia Loron, MD;  Location: [facility] OR;  Service: General;  Laterality: Right;   RADIOACTIVE SEED GUIDED EXCISIONAL BREAST BIOPSY Left 11/28/2020   Procedure: RADIOACTIVE SEED GUIDED LEFT EXCISIONAL BREAST BIOPSY;  Surgeon: Emelia Loron, MD;  Location: [facility] OR;  Service: General;  Laterality: Left;   ROOT CANAL     2019   TONSILLECTOMY  1969   URETERAL EXPLORATION     dilataion for congenitally small ureter   Patient Active Problem List   Diagnosis Date Noted   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   Vitamin D deficiency 08/30/2021   Osteoporosis 08/30/2021   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   Genetic testing 11/15/2020   Family history of breast cancer 11/07/2020   Family history of pancreatic cancer 11/07/2020   Dysphagia 10/26/2020   Malignant neoplasm of overlapping sites of right breast in female, estrogen receptor positive 06/21/2020   Hyperthyroidism 03/28/2016      REFERRING PROVIDER: Emelia Loron, MD  REFERRING DIAG: s/p Right Breast Cancer  THERAPY DIAG:  Aftercare following surgery for neoplasm  Stiffness of right shoulder, not elsewhere classified  Malignant neoplasm  of overlapping sites of right breast in female, estrogen receptor positive  ONSET DATE: November 2023  Rationale for Evaluation and Treatment: Rehabilitation  SUBJECTIVE:                                                                                                                                                                                           SUBJECTIVE STATEMENT: I felt good after last visit. I practiced the reaching to the night stand several times without pain over the last few days. Fewer sharp pains now.  PERTINENT HISTORY:    R breast cancer, left outer quadrant, triple  positive, Ki 67 - 60%, grade 2 -3 IDC, neoadjuvant chemo and immuno therapy,  right mastectomy on 11/28/2020 with 4 nodes removed on the right. She also had radiation ending 02/17/2021. and she had an excision of breast tissue on the left that was benign   PAIN:  Are you having pain? Not right now, tender in axillary NPRS scale: 0/10 presently Pain location: Under mastectomy incision Pain orientation: Right  PAIN TYPE: sharp and tight Pain description: intermittent  Aggravating factors: reaching to night stand, when first starting to stretch its tight Relieving factors: not reaching  PRECAUTIONS: Right UE lymphedema risk  WEIGHT BEARING RESTRICTIONS: No  FALLS:  Has patient fallen in last 6 months? 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 A little restricted at lateral border, and more tender  SENSATION: Light touch: Deficits light numbness posterior right arm and axilla  POSTURE: forward head, rounded shoulders  UPPER EXTREMITY AROM/PROM:  A/PROM RIGHT   eval   Shoulder extension 60  Shoulder flexion 144  Shoulder abduction 163  Shoulder internal rotation 70  Shoulder external rotation 98    (Blank rows = not tested)  A/PROM LEFT   eval  Shoulder extension 58  Shoulder flexion 153  Shoulder  abduction 170  Shoulder internal rotation 70  Shoulder external rotation 96    (Blank rows = not tested)  CERVICAL AROM:  All within normal limits:    UPPER EXTREMITY STRENGTH: WNL   LYMPHEDEMA ASSESSMENTS:   SURGERY TYPE/DATE: Right Mastectomy 11/28/2020  NUMBER OF LYMPH NODES REMOVED: 4  CHEMOTHERAPY: Yes  RADIATION:Yes  HORMONE TREATMENT: NO  INFECTIONS: NO  LYMPHEDEMA ASSESSMENTS:   LANDMARK RIGHT  eval  10 cm proximal to olecranon process 26.7  Olecranon process 23.6  10 cm proximal to ulnar styloid process 17.5  Just proximal to ulnar styloid process 14.4  Across hand at thumb web space 17.3  At base of 2nd digit 5.8  (Blank rows = not tested)  LANDMARK LEFT  eval  10 cm proximal to olecranon process 28.4  Olecranon process 24.3  10 cm proximal to ulnar styloid process 17.9  Just proximal to ulnar styloid process 14.6  Across hand at thumb web space 17.3  At base of 2nd digit 5.5  (Blank rows = not tested)      QUICK DASH SURVEY: 14%   TODAY'S TREATMENT:                                                                                                                                         DATE:  10/18/2022 Soft tissue mobilization to right UT, Pectorals, lateral trunk, and in SL to scapular area with cocoa butter. Scar massage to mastectomy incision, MFR in different directions to right chest area Cupping to right scapular area and right mastectomy incision above and below. PROM right shoulder with MFR to axilla  10/16/2022 Soft tissue mobilization to right UT, Pectorals, lateral trunk with cocoa butter Scar massage to mastectomy incision, MFR in different directions to right chest area LTR x 4 ea side with arms outstretched and goal post arms Open book stretch x 5 PROM right shoulder flexion, scaption, abduction Reaching for cones to far right 2 x 4, and to far left reaching with Right UE to simulate reach to night stand 2 x 4. Able to do without  increased pain in chest.    10/11/2022 Soft tissue mobilization to right UT, Pectorals, lateral trunk with cocoa butter Scar massage to mastectomy incision, MFR in different directions to right chest area Supine wand flexion and scap x4 ea with 5 sec hold AROM bilateral UE's x 5 for flexion, scaption, abduction Open book stretch x 5 PROM right shoulder flexion, scaption, abduction Pectoralis doorway stretch single and double arm x 2 Pt to simulate reaching to night stand to see if she has improved.   10/09/2022 Soft tissue mobilization to right UT, Pectorals, lateral trunk with cocoa butter Scar massage to mastectomy incision, MFR in different directions to right chest area Supine wand flexion and scap x 3-4 ea with 5 sec hold LTR x 5 with arms outstretched and goal post arms PROM right shoulder flexion, scaption, abduction Updated HEP with supine wand flex and scaption  10/01/2022  PATIENT EDUCATION:  10/10/2022: updated HEP with supine wand  flexion and scaption Education details: pt to perform gentle stretches she is already doing but to add an additional session in the afternoon/evening. Educated in LTR with arms outstretched and and gave handout. Also educated in scar massage to mastectomy incision. Person educated: Patient Education method: Chief Technology Officer Education comprehension: verbalized understanding and returned demonstration  HOME EXERCISE PROGRAM: LTR with arms outstretched, stargazer, scar massage, supine wand flex and scap  ASSESSMENT:  CLINICAL IMPRESSION: Continued STM and MFR and PROM.  Added cupping to right scapular area and around right mastectomy incision. Pt felt much better after cupping session and felt less tenderness overall. She also felt the small nodular scar tissue at lateral incision had improved. She has had fewer sharp pains with reaching. . OBJECTIVE IMPAIRMENTS: decreased activity tolerance, decreased mobility, decreased ROM, impaired UE  functional use, postural dysfunction, and pain.   ACTIVITY LIMITATIONS: reach over head and to the side  PARTICIPATION LIMITATIONS:  Pt is doing all that is required but is cautious with reaching  PERSONAL FACTORS: 1-2 comorbidities: Right breast triple positive Breast cancer s/p right mastectomy with chemo and radiation  are also affecting patient's functional outcome.   REHAB POTENTIAL: Good  CLINICAL DECISION MAKING: Stable/uncomplicated  EVALUATION COMPLEXITY: Low  GOALS: Goals reviewed with patient? Yes  SHORT TERM GOALS=LONG TERM GOALS: Target date: 5/202/2024  Pt will be independent in a HEP for gentle stretching of  mastectomy scar and right upper quarter to relieve pain Baseline: Goal status: MET 10/18/2022 2.  Pt will note decreased tightness/pain by atleast 50% Baseline:  Goal status: INITIAL  3.  Pt will be able to reach to her nightstand without sharp pain Baseline:  Goal status: INITIAL  4.  Pts right shoulder flexion and abduction wiill be WNL Baseline: flex 144, abd 163 Goal status: INITIAL     PLAN:  PT FREQUENCY: 2x/week  PT DURATION: 6 weeks  PLANNED INTERVENTIONS: Therapeutic exercises, Therapeutic activity, Patient/Family education, Self Care, Joint mobilization, Orthotic/Fit training, Dry Needling, scar mobilization, Manual therapy, and Re-evaluation  PLAN FOR NEXT SESSION: consider cupping around incision, scap. area,STM to right UQ to decrease tissue tension, supine wand scaption, scar massage, MFR to right chest area, PROM Progress to strength when ready. Pt will be travelling some (NO SOZO noted in chart due to plate in wrist)   Waynette Buttery, PT 10/18/2022, 5:11 PM

## 2022-10-23 ENCOUNTER — Encounter: Payer: Self-pay | Admitting: Physical Therapy

## 2022-10-23 ENCOUNTER — Ambulatory Visit: Payer: BC Managed Care – PPO

## 2022-10-23 ENCOUNTER — Other Ambulatory Visit (HOSPITAL_COMMUNITY): Payer: Self-pay

## 2022-10-23 ENCOUNTER — Encounter: Payer: BC Managed Care – PPO | Admitting: Physical Therapy

## 2022-10-23 DIAGNOSIS — M25611 Stiffness of right shoulder, not elsewhere classified: Secondary | ICD-10-CM

## 2022-10-23 DIAGNOSIS — Z170 Estrogen receptor positive status [ER+]: Secondary | ICD-10-CM | POA: Diagnosis not present

## 2022-10-23 DIAGNOSIS — C50811 Malignant neoplasm of overlapping sites of right female breast: Secondary | ICD-10-CM | POA: Diagnosis not present

## 2022-10-23 DIAGNOSIS — M62838 Other muscle spasm: Secondary | ICD-10-CM | POA: Diagnosis not present

## 2022-10-23 DIAGNOSIS — Z483 Aftercare following surgery for neoplasm: Secondary | ICD-10-CM | POA: Diagnosis not present

## 2022-10-23 DIAGNOSIS — M6281 Muscle weakness (generalized): Secondary | ICD-10-CM | POA: Diagnosis not present

## 2022-10-23 NOTE — Therapy (Signed)
OUTPATIENT PHYSICAL THERAPY TREATMENT NOTE   Patient Name: Teresa Sheppard MRN: [id number] DOB:July 24, 1960, 62 y.o., female Today's Date: 10/23/2022  PCP:  Georgann Housekeeper, MD  REFERRING PROVIDER: Selmer Dominion, NP   END OF SESSION:   PT End of Session - 10/23/22 1046     Visit Number 2   pelvic; cancer 6   Date for PT Re-Evaluation 01/08/23   cancer 11/12/22   Authorization Type BCBS    Authorization - Visit Number 7    PT Start Time 0930    PT Stop Time 1025    PT Time Calculation (min) 55 min    Activity Tolerance Patient tolerated treatment well    Behavior During Therapy WFL for tasks assessed/performed             Past Medical History:  Diagnosis Date   Anxiety    Breast cancer in female [facility])    Right   Family history of breast cancer 11/07/2020   Family history of pancreatic cancer 11/07/2020   History of kidney stones 2014-2015   Hyperthyroidism    No longer an issue   PONV (postoperative nausea and vomiting)    Thyromegaly    Past Surgical History:  Procedure Laterality Date   ABDOMINAL WALL DEFECT REPAIR     following hernia repair in 2010, no mesh was used   ABDOMINOPLASTY     with hernia repair   APPENDECTOMY     BOWEL RESECTION     2008  while pregnant with last child. Scar tissue from appendectomy wrapped around bowel and was snipped to free bowel.    CESAREAN SECTION     DILATION AND CURETTAGE OF UTERUS     2000 and 2014   HERNIA REPAIR     MASTECTOMY W/ SENTINEL NODE BIOPSY Right 11/28/2020   Procedure: RIGHT MASTECTOMY WITH RIGHT AXILLARY SENTINEL LYMPH NODE BIOPSY;  Surgeon: Emelia Loron, MD;  Location: [facility] OR;  Service: General;  Laterality: Right;   PORT-A-CATH REMOVAL Left 01/30/2021   Procedure: PORT REMOVAL;  Surgeon: Emelia Loron, MD;  Location: [facility] OR;  Service: General;  Laterality: Left;  LOCAL   PORTACATH PLACEMENT Left 07/12/2020   Procedure: INSERTION PORT-A-CATH WITH ULTRASOUND GUIDANCE;  Surgeon: Emelia Loron, MD;  Location: [facility] OR;  Service: General;  Laterality: Left;   RADIOACTIVE SEED GUIDED AXILLARY SENTINEL LYMPH NODE Right 11/28/2020   Procedure: RADIOACTIVE SEED GUIDED RIGHT AXILLARY AXILLARY SENTINEL LYMPH NODE EXCISION;  Surgeon: Emelia Loron, MD;  Location: [facility] OR;  Service: General;  Laterality: Right;   RADIOACTIVE SEED GUIDED EXCISIONAL BREAST BIOPSY Left 11/28/2020   Procedure: RADIOACTIVE SEED GUIDED LEFT EXCISIONAL BREAST BIOPSY;  Surgeon: Emelia Loron, MD;  Location: [facility] OR;  Service: General;  Laterality: Left;   ROOT CANAL     2019   TONSILLECTOMY  1969   URETERAL EXPLORATION     dilataion for congenitally small ureter   Patient Active Problem List   Diagnosis Date Noted   Chemotherapy-induced peripheral neuropathy (HCC) 10/24/2021   Vitamin D deficiency 08/30/2021   Osteoporosis 08/30/2021   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   Genetic testing 11/15/2020   Family history of breast cancer 11/07/2020   Family history of pancreatic cancer 11/07/2020   Dysphagia 10/26/2020   Malignant neoplasm of overlapping sites of right breast in female, estrogen receptor positive (HCC) 06/21/2020   Hyperthyroidism 03/28/2016   REFERRING PROVIDER: Selmer Dominion, NP    REFERRING DIAG:  N32.81 (ICD-10-CM) - Overactive bladder  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 I have been going to the bathroom 4 times per night instead of 6 times. I used the [place] last night and the  vaginal area is irritated. I have nto tried the Mybetrix yet.    PAIN:  Are you having pain? Yes NPRS scale: 3/10 Pain location: External and opening, suprapubic   Pain type: aching and shooting  Pain description: intermittent    Aggravating factors: food Relieving factors: drink more water, pelvic floor relaxation   PRECAUTIONS: Other: breast cancer   WEIGHT BEARING RESTRICTIONS: No   FALLS:  Has patient fallen in last 6 months? No   LIVING ENVIRONMENT: Lives with: lives with their family   OCCUPATION: sitting   PLOF: Independent   PATIENT GOALS: reduce the number of times going to the bathroom   PERTINENT HISTORY:  Dilation and curettage of uterus; Bowel resection; Ureteral exploration; Appendectomy; Cesarean section; Abdominal wall defect repair; Hernia repair; Portacath placement (Left, 07/12/2020); Mastectomy w/ sentinel node biopsy (Right, 11/28/2020); Radioactive seed guided axillary sentinel lymph node (Right, 11/28/2020); Radioactive seed guided excisional breast biopsy (Left, 11/28/2020); Port-a-cath removal (Left, 01/30/2021); and Abdominoplasty. Breast cancer     BOWEL MOVEMENT: no issues with bowel movements Pain with bowel movement: No   URINATION: Pain with urination: No Fully empty bladder: Yes: no because 15 minutes later may have to go again Stream: Weak Urgency: Yes: at night and not as much during the day Frequency: night time voids 6-8 times; during the day goes every 2-3 hours Leakage:  none Pads: No   INTERCOURSE: not sexually active due to the pain    PREGNANCY: Vaginal deliveries 3 Tearing Yes:   C-section deliveries 1 Currently pregnant No   PROLAPSE: None     OBJECTIVE:    DIAGNOSTIC FINDINGS:  none     COGNITION: Overall cognitive status: Within functional limits for tasks assessed                          SENSATION: Light touch: Appears intact Proprioception: Appears intact  POSTURE: No Significant postural  limitations   PELVIC ALIGNMENT:   LUMBARAROM/PROM:   A/PROM A/PROM  eval  Flexion full  Extension Decreased by 50%  Right lateral flexion Decreased by 25%  Left lateral flexion Decreased by 25%  Right rotation Decreased by 25%  Left rotation Decreased by 25%   (Blank rows = not tested)   LOWER EXTREMITY ROM:   Passive ROM Right eval Left eval  Hip external rotation 40 45   (Blank rows = not tested)   LOWER EXTREMITY MMT:   MMT Right eval Left eval  Hip flexion 4/5 4/5  Hip extension 3/5 3/5  Hip abduction 3/5 3/5  Hip adduction 4/5 4/5    PALPATION:   General  decreased movement of the lower rib cage and abdominal tightness                 External Perineal Exam firmness of the perineal tissue                             Internal Pelvic Floor tenderness in right obturator internist, along the sides of the urethra and bladder, iliococcygeus   Patient confirms identification and approves PT to assess internal pelvic floor and treatment Yes   PELVIC MMT:   MMT eval  Vaginal 3/5  (Blank rows = not tested)         TONE: increased   PROLAPSE: none   TODAY'S TREATMENT:       10/23/22 Manual: Soft tissue mobilization: Manual work to the diaphragm  Manual work to the abdomen Scar tissue mobilization: Using suction cup on the lower abdominal scar Lifting techniques of the scar Using suction cup on the scar to lift the tissue Myofascial release: Tissue rolling of the abdomen Release of the lateral abdomen to go through the restrictions Exercises: Stretches/mobility: Educated patient on using the vaginal wand internally to the pelvic floor, along the perineal body, and vulvar area to improve blood flow, tissue mobility, and reduce pain Diaphragmatic breathing to expand the lower rib cage with therapist giving tactile cues Sitting on the foam roll to massage the pelvic floor and work on pelvic floor drops                                                                                                                               PATIENT EDUCATION: 10/23/22 Education details: Access Code: [email or web address] Person educated: Patient Education method: Explanation, Demonstration, Actor cues, Verbal cues, and Handouts Education comprehension: verbalized understanding, returned demonstration, verbal cues required, tactile cues required, and needs further education     HOME EXERCISE PROGRAM: 10/23/22 Access Code: [email or web address] URL: [email or web address] Date: 10/23/2022 Prepared by: Eulis Foster  Program Notes vaginal wand 3 times per weeksit on foam roll daily moving side to side and practice your breathing on it.   Exercises - Supine Diaphragmatic Breathing  - 1 x daily - 7 x  weekly - 1 sets - 10 reps - Seated Diaphragmatic Breathing  - 1 x daily - 7 x weekly - 1 sets - 10 reps   ASSESSMENT:   CLINICAL IMPRESSION: Patient is a 62 y.o. female who was seen today for physical therapy treatment for overactive bladder and levator spasm. Patient had increased abdominal and diaphragm tissue mobility after manual work. She was able to expand her abdomen with greater ease. Patient will try the vaginal wand at home to work on elongation of the pelvic floor. Patient will benefit from skilled therapy to reduce pelvic floor tone and trigger points so she is able to relax her pelvic floor and reduce irritation.    OBJECTIVE IMPAIRMENTS: decreased ROM, decreased strength, increased fascial restrictions, increased muscle spasms, and pain.    ACTIVITY LIMITATIONS: toileting   PARTICIPATION LIMITATIONS: community activity   PERSONAL FACTORS: Age and 3+ comorbidities: Dilation and curettage of uterus; Bowel resection; Ureteral exploration; Appendectomy; Cesarean section; Abdominal wall defect repair; Hernia repair; Portacath placement (Left, 07/12/2020); Mastectomy w/ sentinel node biopsy (Right, 11/28/2020); Radioactive seed guided axillary sentinel lymph  node (Right, 11/28/2020); Radioactive seed guided excisional breast biopsy (Left, 11/28/2020); Port-a-cath removal (Left, 01/30/2021); and Abdominoplasty. Breast cancer  are also affecting patient's functional outcome.    REHAB POTENTIAL: Good   CLINICAL DECISION MAKING: Stable/uncomplicated   EVALUATION COMPLEXITY: Low     GOALS: Goals reviewed with patient? Yes   SHORT TERM GOALS: Target date: 11/07/22   Patient understands how to use vaginal moisturizers to improve the tissue health.  Baseline: Goal status: Met 10/23/22   2.  Patient reports her vaginal irritation decreased >/= 25%.  Baseline:  Goal status: INITIAL   3.  Patient educated on vaginal wand to reduce there trigger points of the pelvic floor.  Baseline:  Goal status: INITIAL   4.  Patient is able to perform pelvic floor drop with diaphragmatic breathing.  Baseline:  Goal status: INITIAL     LONG TERM GOALS: Target date: 01/07/23   Patient independent with advanced HEP for pelvic floor relaxation and core strength.  Baseline:  Goal status: INITIAL   2.  Patient wakes up </= 2 times per night due to reduction of urgency.  Baseline:  Goal status: INITIAL   3.  Patient is able to fully empty her bladder and not urinate again in 15 minutes due to the ability to fully relax her bladder.  Baseline:  Goal status: INITIAL   4.  Patient understands how to manage irritation of the bladder with relaxation and manual exercises.  Baseline:  Goal status: INITIAL       PLAN:   PT FREQUENCY: 1x/week   PT DURATION: 12 weeks   PLANNED INTERVENTIONS: Therapeutic exercises, Therapeutic activity, Neuromuscular re-education, Patient/Family education, Joint mobilization, Dry Needling, Electrical stimulation, Cryotherapy, Moist heat, Ultrasound, Biofeedback, and Manual therapy   PLAN FOR NEXT SESSION: see how it is going with the wand,  hip stretches to release the pelvic floor, manual work to the lower abdomen and  pelvic floor  Eulis Foster, PT 10/23/22 10:48 AM

## 2022-10-23 NOTE — Therapy (Signed)
OUTPATIENT PHYSICAL THERAPY ONCOLOGY EVALUATION  Patient Name: Teresa Sheppard MRN: [id number] DOB:April 12, 1961, 62 y.o., female Today's Date: 10/23/2022  END OF SESSION:  PT End of Session - 10/23/22 1458     Visit Number 6   2 pelvic   Number of Visits 12    Date for PT Re-Evaluation 11/12/22    PT Start Time 1403    PT Stop Time 1457    PT Time Calculation (min) 54 min    Activity Tolerance Patient tolerated treatment well    Behavior During Therapy [facility] for tasks assessed/performed              Past Medical History:  Diagnosis Date   Anxiety    Breast cancer in female [facility])    Right   Family history of breast cancer 11/07/2020   Family history of pancreatic cancer 11/07/2020   History of kidney stones 2014-2015   Hyperthyroidism    No longer an issue   PONV (postoperative nausea and vomiting)    Thyromegaly    Past Surgical History:  Procedure Laterality Date   ABDOMINAL WALL DEFECT REPAIR     following hernia repair in 2010, no mesh was used   ABDOMINOPLASTY     with hernia repair   APPENDECTOMY     BOWEL RESECTION     2008  while pregnant with last child. Scar tissue from appendectomy wrapped around bowel and was snipped to free bowel.    CESAREAN SECTION     DILATION AND CURETTAGE OF UTERUS     2000 and 2014   HERNIA REPAIR     MASTECTOMY W/ SENTINEL NODE BIOPSY Right 11/28/2020   Procedure: RIGHT MASTECTOMY WITH RIGHT AXILLARY SENTINEL LYMPH NODE BIOPSY;  Surgeon: Emelia Loron, MD;  Location: [facility] OR;  Service: General;  Laterality: Right;   PORT-A-CATH REMOVAL Left 01/30/2021   Procedure: PORT REMOVAL;  Surgeon: Emelia Loron, MD;  Location: [facility] OR;  Service: General;  Laterality: Left;  LOCAL   PORTACATH PLACEMENT Left 07/12/2020   Procedure: INSERTION PORT-A-CATH WITH ULTRASOUND GUIDANCE;  Surgeon: Emelia Loron, MD;  Location: [facility] OR;  Service: General;  Laterality: Left;   RADIOACTIVE SEED GUIDED AXILLARY SENTINEL LYMPH NODE Right  11/28/2020   Procedure: RADIOACTIVE SEED GUIDED RIGHT AXILLARY AXILLARY SENTINEL LYMPH NODE EXCISION;  Surgeon: Emelia Loron, MD;  Location: [facility] OR;  Service: General;  Laterality: Right;   RADIOACTIVE SEED GUIDED EXCISIONAL BREAST BIOPSY Left 11/28/2020   Procedure: RADIOACTIVE SEED GUIDED LEFT EXCISIONAL BREAST BIOPSY;  Surgeon: Emelia Loron, MD;  Location: [facility] OR;  Service: General;  Laterality: Left;   ROOT CANAL     2019   TONSILLECTOMY  1969   URETERAL EXPLORATION     dilataion for congenitally small ureter   Patient Active Problem List   Diagnosis Date Noted   Chemotherapy-induced peripheral neuropathy (HCC) 10/24/2021   Vitamin D deficiency 08/30/2021   Osteoporosis 08/30/2021   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   Genetic testing 11/15/2020   Family history of breast cancer 11/07/2020   Family history of pancreatic cancer 11/07/2020   Dysphagia 10/26/2020   Malignant neoplasm of overlapping sites of right breast in female, estrogen receptor positive (HCC) 06/21/2020   Hyperthyroidism 03/28/2016      REFERRING PROVIDER: Emelia Loron, MD  REFERRING DIAG: s/p Right Breast Cancer  THERAPY DIAG:  Aftercare following surgery for neoplasm  Stiffness of right shoulder, not elsewhere classified  Malignant neoplasm of overlapping sites of right breast in female, estrogen receptor positive (HCC)  Other muscle spasm  ONSET DATE: November 2023  Rationale for Evaluation and Treatment: Rehabilitation  SUBJECTIVE:                                                                                                                                                                                           SUBJECTIVE STATEMENT: Still feels a little lumpy at the end of the incision. I feel like most of the lumps are gone now. On Friday before I did chest press I got a short spasm, and I got it again Sunday night when I was walking and stretching a little and felt it a  little reaching for the lamp with the nightstand. I didn't get to stretch yet today.Marland Kitchen   PERTINENT HISTORY:    R breast cancer, left outer quadrant, triple positive, Ki 67 - 60%, grade 2 -3 IDC, neoadjuvant chemo and immuno therapy,  right mastectomy on 11/28/2020 with 4 nodes removed on the right. She also had radiation ending 02/17/2021. and she had an excision of breast tissue on the left that was benign   PAIN:  Are you having pain? Not right now, tender in axillary and chest region on right. NPRS scale: 0/10 presently Pain location: Under mastectomy incision Pain orientation: Right  PAIN TYPE: sharp and tight Pain description: intermittent  Aggravating factors: reaching to night stand, when first starting to stretch its tight Relieving factors: not reaching  PRECAUTIONS: Right UE lymphedema risk  WEIGHT BEARING RESTRICTIONS: No  FALLS:  Has patient fallen in last 6 months? 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 A little restricted at lateral border, and more tender  SENSATION: Light touch: Deficits light numbness posterior right arm and axilla  POSTURE: forward head, rounded shoulders  UPPER EXTREMITY AROM/PROM:  A/PROM RIGHT   eval  RIGHT 10/23/22  Shoulder extension 60   Shoulder flexion  144 156  Shoulder abduction 163 174  Shoulder internal rotation 70   Shoulder external rotation 98     (  Blank rows = not tested)  A/PROM LEFT   eval  Shoulder extension 58  Shoulder flexion 153  Shoulder abduction 170  Shoulder internal rotation 70  Shoulder external rotation 96    (Blank rows = not tested)  CERVICAL AROM: All within normal limits:    UPPER EXTREMITY STRENGTH: WNL   LYMPHEDEMA ASSESSMENTS:   SURGERY TYPE/DATE: Right Mastectomy 11/28/2020  NUMBER OF LYMPH NODES REMOVED: 4  CHEMOTHERAPY: Yes  RADIATION:Yes  HORMONE TREATMENT: NO  INFECTIONS: NO  LYMPHEDEMA ASSESSMENTS:   LANDMARK RIGHT  eval  10 cm proximal to olecranon process 26.7  Olecranon process 23.6  10 cm proximal to ulnar styloid process 17.5  Just proximal to ulnar styloid process 14.4  Across hand at thumb web space 17.3  At base of 2nd digit 5.8  (Blank rows = not tested)  LANDMARK LEFT  eval  10 cm proximal to olecranon process 28.4  Olecranon process 24.3  10 cm proximal to ulnar styloid process 17.9  Just proximal to ulnar styloid process 14.6  Across hand at thumb web space 17.3  At base of 2nd digit 5.5  (Blank rows = not tested)      QUICK DASH SURVEY: 14%   TODAY'S TREATMENT:                                                                                                                                         DATE:  10/23/2022 Soft tissue mobilization to right UT, Pectorals, lateral trunk, and in SL to scapular area with cocoa butter. Scar massage to mastectomy incision, MFR in different directions to right chest area Cupping to right scapular area and right mastectomy incision above and below. PROM right shoulder with MFR to axilla    10/18/2022 Soft tissue mobilization to right UT, Pectorals, lateral trunk, and in SL to scapular area with cocoa butter. Scar massage to mastectomy incision, MFR in different directions to right chest area Cupping to right  scapular area and right mastectomy incision above and below. PROM right shoulder with MFR to axilla  10/16/2022 Soft tissue mobilization to right UT, Pectorals, lateral trunk with cocoa butter Scar massage to mastectomy incision, MFR in different directions to right chest area LTR x 4 ea side with arms outstretched and goal post arms Open book stretch x 5 PROM right shoulder flexion, scaption, abduction Reaching for cones to far right 2 x 4, and to far left reaching with Right UE to simulate reach to night stand 2 x 4. Able to do without increased pain in chest.    10/11/2022 Soft tissue mobilization to right UT, Pectorals, lateral trunk with cocoa butter Scar massage to mastectomy incision, MFR in different directions to right chest area Supine wand flexion and scap x4 ea with 5 sec hold AROM bilateral UE's x 5 for flexion, scaption, abduction Open book stretch x 5 PROM right shoulder flexion, scaption, abduction  Pectoralis doorway stretch single and double arm x 2 Pt to simulate reaching to night stand to see if she has improved.   10/09/2022 Soft tissue mobilization to right UT, Pectorals, lateral trunk with cocoa butter Scar massage to mastectomy incision, MFR in different directions to right chest area Supine wand flexion and scap x 3-4 ea with 5 sec hold LTR x 5 with arms outstretched and goal post arms PROM right shoulder flexion, scaption, abduction Updated HEP with supine wand flex and scaption  10/01/2022  PATIENT EDUCATION:  10/10/2022: updated HEP with supine wand flexion and scaption Education details: pt to perform gentle stretches she is already doing but to add an additional session in the afternoon/evening. Educated in LTR with arms outstretched and and gave handout. Also educated in scar massage to mastectomy incision. Person educated: Patient Education method: Explanation and Handouts Education comprehension: verbalized understanding and returned  demonstration  HOME EXERCISE PROGRAM: LTR with arms outstretched, stargazer, scar massage, supine wand flex and scap  ASSESSMENT:  CLINICAL IMPRESSION:  Pt has had several chest spasms from Friday to Sunday, but mostly minor. She notices good decreased in nodular areas that were bothering her. AROM of the right shoulder is now WNL and pt is compliant with HEP . OBJECTIVE IMPAIRMENTS: decreased activity tolerance, decreased mobility, decreased ROM, impaired UE functional use, postural dysfunction, and pain.   ACTIVITY LIMITATIONS: reach over head and to the side  PARTICIPATION LIMITATIONS:  Pt is doing all that is required but is cautious with reaching  PERSONAL FACTORS: 1-2 comorbidities: Right breast triple positive Breast cancer s/p right mastectomy with chemo and radiation  are also affecting patient's functional outcome.   REHAB POTENTIAL: Good  CLINICAL DECISION MAKING: Stable/uncomplicated  EVALUATION COMPLEXITY: Low  GOALS: Goals reviewed with patient? Yes  SHORT TERM GOALS=LONG TERM GOALS: Target date: 5/202/2024  Pt will be independent in a HEP for gentle stretching of  mastectomy scar and right upper quarter to relieve pain Baseline: Goal status: MET 10/18/2022 2.  Pt will note decreased tightness/pain by atleast 50% Baseline:  Goal status: INITIAL  3.  Pt will be able to reach to her nightstand without sharp pain Baseline:  Goal status: INITIAL  4.  Pts right shoulder flexion and abduction wiill be WNL Baseline: flex 144, abd 163 Goal status: MET 10/23/2022    PLAN:  PT FREQUENCY: 2x/week  PT DURATION: 6 weeks  PLANNED INTERVENTIONS: Therapeutic exercises, Therapeutic activity, Patient/Family education, Self Care, Joint mobilization, Orthotic/Fit training, Dry Needling, scar mobilization, Manual therapy, and Re-evaluation  PLAN FOR NEXT SESSION: consider cupping around incision, scap. area,STM to right UQ to decrease tissue tension, supine wand scaption,  scar massage, MFR to right chest area, PROM Progress to strength when ready. Pt will be travelling some (NO SOZO noted in chart due to plate in wrist)   Waynette Buttery, PT 10/23/2022, 2:59 PM

## 2022-10-27 NOTE — Progress Notes (Signed)
Patient Care Team: Georgann Housekeeper, MD as PCP - General (Internal Medicine) Emelia Loron, MD as Consulting Physician (General Surgery) Serena Croissant, MD as Medical Oncologist (Hematology and Oncology) Dorothy Puffer, MD as Consulting Physician (Radiation Oncology) Dorisann Frames, MD as Referring Physician (Endocrinology) Cecile Hearing, MD as Referring Physician (Endocrinology) Willis Modena, MD as Consulting Physician (Gastroenterology) Anselm Lis, RPH-CPP as Pharmacist (Hematology and Oncology)  DIAGNOSIS:  Encounter Diagnosis  Name Primary?   Malignant neoplasm of overlapping sites of right breast in female, estrogen receptor positive (HCC) Yes    SUMMARY OF ONCOLOGIC HISTORY: Oncology History  Malignant neoplasm of overlapping sites of right breast in female, estrogen receptor positive (HCC)  06/21/2020 Initial Diagnosis   Patient palpated a right breast mass x 1 wk. Mammogram showed multiple confluent masses in the right breast at the 7 o'clock position measuring 2.3cm and at the 9 o'clock position measuring 2.2cm with one right axillary lymph node with cortical thickening. Biopsy showed invasive mammary carcinoma at both positions and in the axilla, grade 2-3, HER-2 equivocal by IHC (2+), positive by FISH (ratio 3.34), ER+ >95%, PR+ 25%, Ki67 60%.   06/29/2020 Cancer Staging   Staging form: Breast, AJCC 8th Edition - Clinical stage from 06/29/2020: Stage IB (cT2, cN1(f), cM0, G3, ER+, PR+, HER2+) - Signed by Serena Croissant, MD on 06/29/2020   07/13/2020 - 11/16/2020 Neo-Adjuvant Chemotherapy   Taxotere, Carbo, Herceptin, Perjeta x 6 followed by 1 cycle of herceptin/perjeta   10/28/2020 Breast MRI   Complete imaging response   11/14/2020 Genetic Testing   Negative hereditary cancer genetic testing: no pathogenic variants detected in [facility] BRCAPlus Panel and [facility] CancerNext-Expanded +RNAinsight Panel.  The report dates are Nov 14, 2020 and December 05, 2020, respectively.     The BRCAplus panel offered by [employer] and includes sequencing and deletion/duplication analysis for the following 8 genes: ATM, BRCA1, BRCA2, CDH1, CHEK2, PALB2, PTEN, and TP53.  The CancerNext-Expanded gene panel offered by [facility] and includes sequencing, rearrangement, and RNA analysis for the following 77 genes: AIP, ALK, APC, ATM, AXIN2, BAP1, BARD1, BLM, BMPR1A, BRCA1, BRCA2, BRIP1, CDC73, CDH1, CDK4, CDKN1B, CDKN2A, CHEK2, CTNNA1, DICER1, FANCC, FH, FLCN, GALNT12, KIF1B, LZTR1, MAX, MEN1, MET, MLH1, MSH2, MSH3, MSH6, MUTYH, NBN, NF1, NF2, NTHL1, PALB2, PHOX2B, PMS2, POT1, PRKAR1A, PTCH1, PTEN, RAD51C, RAD51D, RB1, RECQL, RET, SDHA, SDHAF2, SDHB, SDHC, SDHD, SMAD4, SMARCA4, SMARCB1, SMARCE1, STK11, SUFU, TMEM127, TP53, TSC1, TSC2, VHL and XRCC2 (sequencing and deletion/duplication); EGFR, EGLN1, HOXB13, KIT, MITF, PDGFRA, POLD1, and POLE (sequencing only); EPCAM and GREM1 (deletion/duplication only).    11/28/2020 Surgery   Right mastectomy Dwain Sarna): IDC, grade 3, spanning 4.0cm fibrotic area, clear margins, 1/4 right axillary lymph nodes positive for carcinoma, 0.5cm.   Left lumpectomy: no evidence of malignancy   11/28/2020 Cancer Staging   Staging form: Breast, AJCC 8th Edition - Pathologic stage from 11/28/2020: No Stage Recommended (ypT2, pN1a, cM0) - Signed by Loa Socks, NP on 07/26/2021 Stage prefix: Post-therapy   12/07/2020 - 05/23/2021 Chemotherapy   Patient is on Treatment Plan : BREAST ADO-Trastuzumab Emtansine (Kadcyla) q21d     01/04/2021 - 02/17/2021 Radiation Therapy   Site Technique Total Dose (Gy) Dose per Fx (Gy) Completed Fx Beam Energies  Chest Wall, Right: CW_Rt 3D 50.4/50.4 1.8 28/28 6X  Chest Wall, Right: CW_Rt_SCLV 3D 50.4/50.4 1.8 28/28 6X, 10X  Chest Wall, Right: CW_Rt_Bst Electron 10/10 2 5/5 6E     12/2020 -  Anti-estrogen oral therapy   Letrozole daily  CHIEF COMPLIANT:  Follow-up right breast cancer on Nertinib and letrozole    INTERVAL HISTORY: Teresa Sheppard is a 62 y.o with the above-mentioned right breast cancer on Nertinib and letrozole. She presents to the clinic for today for a follow-up.  Overall she has been tolerating neratinib fairly well.  She had multiple questions related to follow-up care and treatment options.   ALLERGIES:  is allergic to shellfish-derived products, ciprofibrate, propylthiouracil, shellfish allergy, shrimp extract, hydrocodone, and penicillins.  MEDICATIONS:  Current Outpatient Medications  Medication Sig Dispense Refill   acetaminophen (TYLENOL) 325 MG tablet Take 325 mg by mouth every 6 (six) hours as needed (headaches / pain).     Biotin w/ Vitamins C & E (HAIR SKIN & NAILS GUMMIES PO) Take by mouth.     Calcium-Vitamin D-Vitamin K ([employer] CALCIUM SOFT CHEWS) 650-12.5-40 MG-MCG-MCG CHEW Chew 1 tablet by mouth 2 (two) times daily.     cholecalciferol (VITAMIN D3) 25 MCG (1000 UNIT) tablet Take 2,000 Units by mouth daily.     D-Mannose 500 MG CAPS Take 500 mg by mouth in the morning and at bedtime.     estradiol (ESTRACE VAGINAL) 0.1 MG/GM vaginal cream Place 1 Applicatorful vaginally at bedtime. (Patient taking differently: Place 1 Applicatorful vaginally at bedtime. Per urology, 1g  two times per week) 42.5 g 12   Lactobacillus-Inulin (CULTURELLE ADULT ULT BALANCE PO) Take by mouth daily.     letrozole (FEMARA) 2.5 MG tablet TAKE 1 TABLET(2.5 MG) BY MOUTH DAILY 90 tablet 3   LUVENA VAGINAL MOISTURIZER VA Place vaginally. Once a week     mirabegron ER (MYRBETRIQ) 25 MG TB24 tablet Take 1 tablet (25 mg total) by mouth daily. 30 tablet 5   Multiple Vitamins-Minerals (HAIR/SKIN/NAILS/BIOTIN PO) Take 1 capsule by mouth daily.     Neratinib Maleate (NERLYNX) 40 MG tablet Take 5 tablets (200 mg total) by mouth daily. Take with food. 150 tablet 3   Polyethyl Glycol-Propyl Glycol (SYSTANE OP) Place 1 drop into both eyes daily as needed (dry eyes).     vitamin E 91478 units external oil  Apply topically daily. Vaginally 2-3 times per week     No current facility-administered medications for this visit.    PHYSICAL EXAMINATION: ECOG PERFORMANCE STATUS: 1 - Symptomatic but completely ambulatory  Vitals:   11/02/22 1024  BP: (!) 113/57  Pulse: (!) 56  Resp: 16  Temp: 97.7 F (36.5 C)  SpO2: 99%   Filed Weights   11/02/22 1024  Weight: 152 lb 3.2 oz (69 kg)      LABORATORY DATA:  I have reviewed the data as listed    Latest Ref Rng & Units 11/02/2022    9:55 AM 08/21/2022   12:46 PM 06/28/2022   11:53 AM  CMP  Glucose 70 - 99 mg/dL 56  73  84   BUN 8 - 23 mg/dL 16  14  14    Creatinine 0.44 - 1.00 mg/dL 2.95  6.21  3.08   Sodium 135 - 145 mmol/L 141  139  138   Potassium 3.5 - 5.1 mmol/L 3.5  4.2  4.1   Chloride 98 - 111 mmol/L 106  102  104   CO2 22 - 32 mmol/L 28  31  29    Calcium 8.9 - 10.3 mg/dL 9.3  9.4  9.7   Total Protein 6.5 - 8.1 g/dL 7.6  7.9  7.3   Total Bilirubin 0.3 - 1.2 mg/dL 0.5  0.5  0.5  Alkaline Phos 38 - 126 U/L 90  92  109   AST 15 - 41 U/L 31  39  37   ALT 0 - 44 U/L 19  24  30      Lab Results  Component Value Date   WBC 4.8 11/02/2022   HGB 12.4 11/02/2022   HCT 37.4 11/02/2022   MCV 94.9 11/02/2022   PLT 157 11/02/2022   NEUTROABS 3.3 11/02/2022    ASSESSMENT & PLAN:  Malignant neoplasm of overlapping sites of right breast in female, estrogen receptor positive (HCC) 06/21/2020:Patient palpated a right breast mass x 1 wk. Mammogram showed multiple confluent masses in the right breast at the 7 o'clock position measuring 2.3cm and at the 9 o'clock position measuring 2.2cm with one right axillary lymph node with cortical thickening. Biopsy showed invasive mammary carcinoma at both positions and in the axilla, grade 2-3, HER-2 equivocal by IHC (2+), positive by FISH (ratio 3.34), ER+ >95%, PR+ 25%, Ki67 60%.   Treatment plan: 1. Neoadjuvant chemotherapy with TCH Perjeta 6 cycles completed 10/26/2020 followed by Herceptin Perjeta  versus [name] for 1 year completed 05/23/2021 2.  Right mastectomy Dwain Sarna): IDC, grade 3, spanning 4.0cm fibrotic area, clear margins, 1/4 right axillary lymph nodes positive for carcinoma, 0.5cm. Left lumpectomy: no evidence of malignancy 3. Followed by adjuvant radiation therapy completed 02/17/2021 4.  Followed by adjuvant antiestrogen therapy started 12/29/2020 5.  Followed by neratinib (started 12/28/2021) ---------------------------------------------------------------------------------------------------------------------------------- Current Treatment:letrozole  COVID infection: August 2023: Treated with antiviral medication. (Molnupiravir) Letrozole toxicities: Tolerating it extremely well.    Surveillance:  1. Mammograms June 08, 2021 at [facility]: Benign Bone density: November 2022: T score -2.4:  2. Breast MRI: 11/17/2021: Benign breast density category C   Current Treatment: Neratinib held for COVID-19 infection: Restarted neratinib on 01/30/2022. Neratinib toxicities: 1.  Diarrhea: Neratinib 5 tablets a day 2. mild dizziness: Probably orthostatic in nature. 3.  Mild fatigue  4.  Mild peripheral neuropathy in the toes: Watchful monitoring   Multiple and frequent UTIs: I gave her prescription for Estrace cream to prevent vaginal dryness. Breast MRI 10/31/2022: No MRI evidence of malignancy.  Prior right mastectomy.  Left breast Mammogram recommended  Return to clinic in 3 months for her last follow-up on neratinib.    Orders Placed This Encounter  Procedures   CMP (Cancer Center only)    Standing Status:   Future    Number of Occurrences:   1    Standing Expiration Date:   11/02/2023   The patient has a good understanding of the overall plan. she agrees with it. she will call with any problems that may develop before the next visit here. Total time spent: 30 mins including face to face time and time spent for planning, charting and co-ordination of care   Tamsen Meek, MD 11/05/22    I Janan Ridge am acting as a Neurosurgeon for [employer]  I have reviewed the above documentation for accuracy and completeness, and I agree with the above.

## 2022-10-29 ENCOUNTER — Other Ambulatory Visit (HOSPITAL_COMMUNITY): Payer: Self-pay

## 2022-10-30 ENCOUNTER — Encounter: Payer: BC Managed Care – PPO | Attending: Obstetrics and Gynecology | Admitting: Physical Therapy

## 2022-10-30 ENCOUNTER — Encounter: Payer: Self-pay | Admitting: Physical Therapy

## 2022-10-30 ENCOUNTER — Ambulatory Visit
Admission: RE | Admit: 2022-10-30 | Discharge: 2022-10-30 | Disposition: A | Discharge status: Home / Self Care | Payer: BC Managed Care – PPO | Source: Ambulatory Visit | Attending: Hematology and Oncology | Admitting: Hematology and Oncology

## 2022-10-30 DIAGNOSIS — Z1231 Encounter for screening mammogram for malignant neoplasm of breast: Secondary | ICD-10-CM | POA: Diagnosis not present

## 2022-10-30 DIAGNOSIS — Z483 Aftercare following surgery for neoplasm: Secondary | ICD-10-CM

## 2022-10-30 DIAGNOSIS — M6281 Muscle weakness (generalized): Secondary | ICD-10-CM | POA: Diagnosis not present

## 2022-10-30 DIAGNOSIS — M62838 Other muscle spasm: Secondary | ICD-10-CM | POA: Insufficient documentation

## 2022-10-30 DIAGNOSIS — C50811 Malignant neoplasm of overlapping sites of right female breast: Secondary | ICD-10-CM

## 2022-10-30 MED ORDER — GADOPICLENOL 0.5 MMOL/ML IV SOLN
7.0000 mL | Freq: Once | INTRAVENOUS | Status: AC | PRN
  Administered 2022-10-30: 7 mL via INTRAVENOUS

## 2022-10-30 NOTE — Therapy (Signed)
OUTPATIENT PHYSICAL THERAPY TREATMENT NOTE   Patient Name: Teresa Sheppard MRN: [id number] DOB:[birth year], 62 y.o., female Today's Date: 10/30/2022  PCP: Georgann Housekeeper, MD  REFERRING PROVIDER: Selmer Dominion, NP   END OF SESSION:   PT End of Session - 10/30/22 1137     Visit Number 3   6 breast; 3 pelvic   Date for PT Re-Evaluation 01/08/23   11/12/22 breast   Authorization Type [employer]    Authorization - Visit Number 9    Authorization - Number of Visits 30    PT Start Time 1130    PT Stop Time 1215    PT Time Calculation (min) 45 min    Activity Tolerance Patient tolerated treatment well    Behavior During Therapy WFL for tasks assessed/performed             Past Medical History:  Diagnosis Date   Anxiety    Breast cancer in female [facility])    Right   Family history of breast cancer 11/07/2020   Family history of pancreatic cancer 11/07/2020   History of kidney stones 2014-2015   Hyperthyroidism    No longer an issue   PONV (postoperative nausea and vomiting)    Thyromegaly    Past Surgical History:  Procedure Laterality Date   ABDOMINAL WALL DEFECT REPAIR     following hernia repair in 2010, no mesh was used   ABDOMINOPLASTY     with hernia repair   APPENDECTOMY     BOWEL RESECTION     2008  while pregnant with last child. Scar tissue from appendectomy wrapped around bowel and was snipped to free bowel.    CESAREAN SECTION     DILATION AND CURETTAGE OF UTERUS     2000 and 2014   HERNIA REPAIR     MASTECTOMY W/ SENTINEL NODE BIOPSY Right 11/28/2020   Procedure: RIGHT MASTECTOMY WITH RIGHT AXILLARY SENTINEL LYMPH NODE BIOPSY;  Surgeon: Emelia Loron, MD;  Location: [facility] OR;  Service: General;  Laterality: Right;   PORT-A-CATH REMOVAL Left 01/30/2021   Procedure: PORT REMOVAL;  Surgeon: Emelia Loron, MD;  Location: [facility] OR;  Service: General;  Laterality: Left;  LOCAL   PORTACATH PLACEMENT Left 07/12/2020   Procedure: INSERTION PORT-A-CATH WITH  ULTRASOUND GUIDANCE;  Surgeon: Emelia Loron, MD;  Location: [facility] OR;  Service: General;  Laterality: Left;   RADIOACTIVE SEED GUIDED AXILLARY SENTINEL LYMPH NODE Right 11/28/2020   Procedure: RADIOACTIVE SEED GUIDED RIGHT AXILLARY AXILLARY SENTINEL LYMPH NODE EXCISION;  Surgeon: Emelia Loron, MD;  Location: [facility] OR;  Service: General;  Laterality: Right;   RADIOACTIVE SEED GUIDED EXCISIONAL BREAST BIOPSY Left 11/28/2020   Procedure: RADIOACTIVE SEED GUIDED LEFT EXCISIONAL BREAST BIOPSY;  Surgeon: Emelia Loron, MD;  Location: [facility] OR;  Service: General;  Laterality: Left;   ROOT CANAL     2019   TONSILLECTOMY  1969   URETERAL EXPLORATION     dilataion for congenitally small ureter   Patient Active Problem List   Diagnosis Date Noted   Chemotherapy-induced peripheral neuropathy (HCC) 10/24/2021   Vitamin D deficiency 08/30/2021   Osteoporosis 08/30/2021   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   Genetic testing 11/15/2020   Family history of breast cancer 11/07/2020   Family history of pancreatic cancer 11/07/2020   Dysphagia 10/26/2020   Malignant neoplasm of overlapping sites of right breast in female, estrogen receptor positive (HCC) 06/21/2020   Hyperthyroidism 03/28/2016   REFERRING PROVIDER: Selmer Dominion, NP    REFERRING DIAG:  N32.81 (ICD-10-CM) - Overactive bladder  M62.838 (ICD-10-CM) - Levator spasm      THERAPY DIAG:              Other muscle spasm Z61,096             Muscle weakness ( generalized) M62.81             Aftercare following surgery for neoplasm Z48.3   Rationale for Evaluation and Treatment: Rehabilitation   ONSET DATE: 2023   SUBJECTIVE:                                                                                                                                                                                            SUBJECTIVE STATEMENT: I have tried the vaginal wand at home. Over the weekend I had irritation.     PAIN:  Are  you having pain? Yes NPRS scale: 3/10 Pain location: External and opening, suprapubic   Pain type: aching and shooting  Pain description: intermittent    Aggravating factors: food Relieving factors: drink more water, pelvic floor relaxation   PRECAUTIONS: Other: breast cancer   WEIGHT BEARING RESTRICTIONS: No   FALLS:  Has patient fallen in last 6 months? No   LIVING ENVIRONMENT: Lives with: lives with their family   OCCUPATION: sitting   PLOF: Independent   PATIENT GOALS: reduce the number of times going to the bathroom   PERTINENT HISTORY:  Dilation and curettage of uterus; Bowel resection; Ureteral exploration; Appendectomy; Cesarean section; Abdominal wall defect repair; Hernia repair; Portacath placement (Left, 07/12/2020); Mastectomy w/ sentinel node biopsy (Right, 11/28/2020); Radioactive seed guided axillary sentinel lymph node (Right, 11/28/2020); Radioactive seed guided excisional breast biopsy (Left, 11/28/2020); Port-a-cath removal (Left, 01/30/2021); and Abdominoplasty. Breast cancer     BOWEL MOVEMENT: no issues with bowel movements Pain with bowel movement: No   URINATION: Pain with urination: No Fully empty bladder: Yes: no because 15 minutes later may have to go again Stream: Weak Urgency: Yes: at night and not as much during the day Frequency: night time voids 6-8 times; during the day goes every 2-3 hours Leakage:  none Pads: No   INTERCOURSE: not sexually active due to the pain    PREGNANCY: Vaginal deliveries 3 Tearing Yes:   C-section deliveries 1 Currently pregnant No   PROLAPSE: None     OBJECTIVE:    DIAGNOSTIC FINDINGS:  none     COGNITION: Overall cognitive status: Within functional limits for tasks assessed                          SENSATION:  Light touch: Appears intact Proprioception: Appears intact     POSTURE: No Significant postural limitations   PELVIC ALIGNMENT:   LUMBARAROM/PROM:   A/PROM A/PROM  eval   Flexion full  Extension Decreased by 50%  Right lateral flexion Decreased by 25%  Left lateral flexion Decreased by 25%  Right rotation Decreased by 25%  Left rotation Decreased by 25%   (Blank rows = not tested)   LOWER EXTREMITY ROM:   Passive ROM Right eval Left eval  Hip external rotation 40 45   (Blank rows = not tested)   LOWER EXTREMITY MMT:   MMT Right eval Left eval  Hip flexion 4/5 4/5  Hip extension 3/5 3/5  Hip abduction 3/5 3/5  Hip adduction 4/5 4/5    PALPATION:   General  decreased movement of the lower rib cage and abdominal tightness                 External Perineal Exam firmness of the perineal tissue                             Internal Pelvic Floor tenderness in right obturator internist, along the sides of the urethra and bladder, iliococcygeus   Patient confirms identification and approves PT to assess internal pelvic floor and treatment Yes   PELVIC MMT:   MMT eval 10/30/22  Vaginal 3/5 3/5 with circular hug  (Blank rows = not tested)         TONE: increased   PROLAPSE: none   TODAY'S TREATMENT:    10/30/22 Manual: Internal pelvic floor techniques: No emotional/communication barriers or cognitive limitation. Patient is motivated to learn. Patient understands and agrees with treatment goals and plan. PT explains patient will be examined in standing, sitting, and lying down to see how their muscles and joints work. When they are ready, they will be asked to remove their underwear so PT can examine their perineum. The patient is also given the option of providing their own chaperone as one is not provided in our facility. The patient also has the right and is explained the right to defer or refuse any part of the evaluation or treatment including the internal exam. With the patient's consent, PT will use one gloved finger to gently assess the muscles of the pelvic floor, seeing how well it contracts and relaxes and if there is muscle symmetry.  After, the patient will get dressed and PT and patient will discuss exam findings and plan of care. PT and patient discuss plan of care, schedule, attendance policy and HEP activities.  Going through the vagina working on the sides of the introitus, superficial transverse perineum, iliococcygeus, along the obturator internist Fascial release along the sides of the bladder and urethra with one finger in the vaginal canal and the other on the lower abdomen Neuromuscular re-education: Pelvic floor contraction training: Pelvic floor contraction with therapist finger in the vaginal canal.  Down training: Diaphragmatic breathing with therapist finger in the vaginal canal working with pelvic drop      10/23/22 Manual: Soft tissue mobilization: Manual work to the diaphragm  Manual work to the abdomen Scar tissue mobilization: Using suction cup on the lower abdominal scar Lifting techniques of the scar Using suction cup on the scar to lift the tissue Myofascial release: Tissue rolling of the abdomen Release of the lateral abdomen to go through the restrictions Exercises: Stretches/mobility: Educated patient on using the vaginal wand internally to  the pelvic floor, along the perineal body, and vulvar area to improve blood flow, tissue mobility, and reduce pain Diaphragmatic breathing to expand the lower rib cage with therapist giving tactile cues Sitting on the foam roll to massage the pelvic floor and work on pelvic floor drops                                                                                                                               PATIENT EDUCATION: 10/23/22 Education details: Access Code: [email or web address] Person educated: Patient Education method: Explanation, Demonstration, Tactile cues, Verbal cues, and Handouts Education comprehension: verbalized understanding, returned demonstration, verbal cues required, tactile cues required, and needs further education     HOME  EXERCISE PROGRAM: 10/23/22 Access Code: [email or web address] URL: [email or web address] Date: 10/23/2022 Prepared by: Eulis Foster   Program Notes vaginal wand 3 times per weeksit on foam roll daily moving side to side and practice your breathing on it.    Exercises - Supine Diaphragmatic Breathing  - 1 x daily - 7 x weekly - 1 sets - 10 reps - Seated Diaphragmatic Breathing  - 1 x daily - 7 x weekly - 1 sets - 10 reps   ASSESSMENT:   CLINICAL IMPRESSION: Patient is a 62 y.o. female who was seen today for physical therapy treatment for overactive bladder and levator spasm. Patient has tried the wand 1 time and is working on being comfortable with it.  Patient felt her bladder could fill up better after the manual work and she felt looser in the pelvic floor region. Her pelvic floor muscles were softer and able to hug the therapist finger better. She was starting to get the hang of the pelvic floor drop with diaphragmatic breathing. Patient will benefit from skilled therapy to reduce pelvic floor tone and trigger points so she is able to relax her pelvic floor and reduce irritation.    OBJECTIVE IMPAIRMENTS: decreased ROM, decreased strength, increased fascial restrictions, increased muscle spasms, and pain.    ACTIVITY LIMITATIONS: toileting   PARTICIPATION LIMITATIONS: community activity   PERSONAL FACTORS: Age and 3+ comorbidities: Dilation and curettage of uterus; Bowel resection; Ureteral exploration; Appendectomy; Cesarean section; Abdominal wall defect repair; Hernia repair; Portacath placement (Left, 07/12/2020); Mastectomy w/ sentinel node biopsy (Right, 11/28/2020); Radioactive seed guided axillary sentinel lymph node (Right, 11/28/2020); Radioactive seed guided excisional breast biopsy (Left, 11/28/2020); Port-a-cath removal (Left, 01/30/2021); and Abdominoplasty. Breast cancer  are also affecting patient's functional outcome.    REHAB POTENTIAL: Good   CLINICAL DECISION  MAKING: Stable/uncomplicated   EVALUATION COMPLEXITY: Low     GOALS: Goals reviewed with patient? Yes   SHORT TERM GOALS: Target date: 11/07/22   Patient understands how to use vaginal moisturizers to improve the tissue health.  Baseline: Goal status: Met 10/23/22   2.  Patient reports her vaginal irritation decreased >/= 25%.  Baseline:  Goal status: INITIAL   3.  Patient educated on vaginal wand to reduce there  trigger points of the pelvic floor.  Baseline:  Goal status: Met 10/30/22   4.  Patient is able to perform pelvic floor drop with diaphragmatic breathing.  Baseline:  Goal status: INITIAL     LONG TERM GOALS: Target date: 01/07/23   Patient independent with advanced HEP for pelvic floor relaxation and core strength.  Baseline:  Goal status: INITIAL   2.  Patient wakes up </= 2 times per night due to reduction of urgency.  Baseline:  Goal status: INITIAL   3.  Patient is able to fully empty her bladder and not urinate again in 15 minutes due to the ability to fully relax her bladder.  Baseline:  Goal status: INITIAL   4.  Patient understands how to manage irritation of the bladder with relaxation and manual exercises.  Baseline:  Goal status: INITIAL       PLAN:   PT FREQUENCY: 1x/week   PT DURATION: 12 weeks   PLANNED INTERVENTIONS: Therapeutic exercises, Therapeutic activity, Neuromuscular re-education, Patient/Family education, Joint mobilization, Dry Needling, Electrical stimulation, Cryotherapy, Moist heat, Ultrasound, Biofeedback, and Manual therapy   PLAN FOR NEXT SESSION: see how it is going with the wand,  hip stretches to release the pelvic floor, manual work to the lower abdomen with hands and suction cup and pelvic floor   Eulis Foster, PT 10/30/22 2:10 PM

## 2022-10-31 ENCOUNTER — Other Ambulatory Visit (HOSPITAL_COMMUNITY): Payer: Self-pay

## 2022-10-31 ENCOUNTER — Ambulatory Visit: Payer: BC Managed Care – PPO | Admitting: Physical Therapy

## 2022-10-31 ENCOUNTER — Ambulatory Visit: Payer: BC Managed Care – PPO | Attending: Hematology and Oncology

## 2022-10-31 DIAGNOSIS — Z170 Estrogen receptor positive status [ER+]: Secondary | ICD-10-CM | POA: Diagnosis not present

## 2022-10-31 DIAGNOSIS — M25611 Stiffness of right shoulder, not elsewhere classified: Secondary | ICD-10-CM

## 2022-10-31 DIAGNOSIS — M62838 Other muscle spasm: Secondary | ICD-10-CM | POA: Diagnosis not present

## 2022-10-31 DIAGNOSIS — C50811 Malignant neoplasm of overlapping sites of right female breast: Secondary | ICD-10-CM | POA: Insufficient documentation

## 2022-10-31 DIAGNOSIS — Z483 Aftercare following surgery for neoplasm: Secondary | ICD-10-CM | POA: Diagnosis not present

## 2022-10-31 DIAGNOSIS — M6281 Muscle weakness (generalized): Secondary | ICD-10-CM | POA: Insufficient documentation

## 2022-10-31 NOTE — Therapy (Signed)
OUTPATIENT PHYSICAL THERAPY ONCOLOGY EVALUATION  Patient Name: Teresa Sheppard MRN: [id number] DOB:[birth year], 62 y.o., female Today's Date: 10/31/2022  END OF SESSION:  PT End of Session - 10/31/22 1105     Visit Number 7   3 pelvic   Number of Visits 12    Date for PT Re-Evaluation 11/12/22    Authorization - Visit Number 10    PT Start Time 1106    PT Stop Time 1158    PT Time Calculation (min) 52 min    Activity Tolerance Patient tolerated treatment well    Behavior During Therapy [facility] for tasks assessed/performed              Past Medical History:  Diagnosis Date   Anxiety    Breast cancer in female [facility])    Right   Family history of breast cancer 11/07/2020   Family history of pancreatic cancer 11/07/2020   History of kidney stones 2014-2015   Hyperthyroidism    No longer an issue   PONV (postoperative nausea and vomiting)    Thyromegaly    Past Surgical History:  Procedure Laterality Date   ABDOMINAL WALL DEFECT REPAIR     following hernia repair in 2010, no mesh was used   ABDOMINOPLASTY     with hernia repair   APPENDECTOMY     BOWEL RESECTION     2008  while pregnant with last child. Scar tissue from appendectomy wrapped around bowel and was snipped to free bowel.    CESAREAN SECTION     DILATION AND CURETTAGE OF UTERUS     2000 and 2014   HERNIA REPAIR     MASTECTOMY W/ SENTINEL NODE BIOPSY Right 11/28/2020   Procedure: RIGHT MASTECTOMY WITH RIGHT AXILLARY SENTINEL LYMPH NODE BIOPSY;  Surgeon: Emelia Loron, MD;  Location: [facility] OR;  Service: General;  Laterality: Right;   PORT-A-CATH REMOVAL Left 01/30/2021   Procedure: PORT REMOVAL;  Surgeon: Emelia Loron, MD;  Location: [facility] OR;  Service: General;  Laterality: Left;  LOCAL   PORTACATH PLACEMENT Left 07/12/2020   Procedure: INSERTION PORT-A-CATH WITH ULTRASOUND GUIDANCE;  Surgeon: Emelia Loron, MD;  Location: [facility] OR;  Service: General;  Laterality: Left;   RADIOACTIVE SEED GUIDED  AXILLARY SENTINEL LYMPH NODE Right 11/28/2020   Procedure: RADIOACTIVE SEED GUIDED RIGHT AXILLARY AXILLARY SENTINEL LYMPH NODE EXCISION;  Surgeon: Emelia Loron, MD;  Location: [facility] OR;  Service: General;  Laterality: Right;   RADIOACTIVE SEED GUIDED EXCISIONAL BREAST BIOPSY Left 11/28/2020   Procedure: RADIOACTIVE SEED GUIDED LEFT EXCISIONAL BREAST BIOPSY;  Surgeon: Emelia Loron, MD;  Location: [facility] OR;  Service: General;  Laterality: Left;   ROOT CANAL     2019   TONSILLECTOMY  1969   URETERAL EXPLORATION     dilataion for congenitally small ureter   Patient Active Problem List   Diagnosis Date Noted   Chemotherapy-induced peripheral neuropathy (HCC) 10/24/2021   Vitamin D deficiency 08/30/2021   Osteoporosis 08/30/2021   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   Genetic testing 11/15/2020   Family history of breast cancer 11/07/2020   Family history of pancreatic cancer 11/07/2020   Dysphagia 10/26/2020   Malignant neoplasm of overlapping sites of right breast in female, estrogen receptor positive (HCC) 06/21/2020   Hyperthyroidism 03/28/2016      REFERRING PROVIDER: Emelia Loron, MD  REFERRING DIAG: s/p Right Breast Cancer  THERAPY DIAG:  Aftercare following surgery for neoplasm  Stiffness of right shoulder, not elsewhere classified  Malignant neoplasm of overlapping sites of  right breast in female, estrogen receptor positive (HCC)  Other muscle spasm  ONSET DATE: November 2023  Rationale for Evaluation and Treatment: Rehabilitation  SUBJECTIVE:                                                                                                                                                                                           SUBJECTIVE STATEMENT:  I was away and didn't stretch at all. I had a spasm last night reaching for the lamp on my night stand.  The little nodules are much better. No pain at rest.  PERTINENT HISTORY:    R breast cancer,  left outer quadrant, triple positive, Ki 67 - 60%, grade 2 -3 IDC, neoadjuvant chemo and immuno therapy,  right mastectomy on 11/28/2020 with 4 nodes removed on the right. She also had radiation ending 02/17/2021. and she had an excision of breast tissue on the left that was benign   PAIN:  Are you having pain? Not right now, tender in axillary and chest region on right. NPRS scale: 0/10 presently Pain location: Under mastectomy incision Pain orientation: Right  PAIN TYPE: sharp and tight Pain description: intermittent  Aggravating factors: reaching to night stand, when first starting to stretch its tight Relieving factors: not reaching  PRECAUTIONS: Right UE lymphedema risk  WEIGHT BEARING RESTRICTIONS: No  FALLS:  Has patient fallen in last 6 months? 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 A little restricted at lateral border, and more tender  SENSATION: Light touch: Deficits light numbness posterior right arm and axilla  POSTURE: forward head, rounded shoulders  UPPER EXTREMITY AROM/PROM:  A/PROM RIGHT   eval  RIGHT 10/23/22  Shoulder extension 60   Shoulder flexion 144 156  Shoulder abduction 163 174  Shoulder internal rotation 70   Shoulder external rotation 98     (Blank rows = not  tested)  A/PROM LEFT   eval  Shoulder extension 58  Shoulder flexion 153  Shoulder abduction 170  Shoulder internal rotation 70  Shoulder external rotation 96    (Blank rows = not tested)  CERVICAL AROM: All within normal limits:    UPPER EXTREMITY STRENGTH: WNL   LYMPHEDEMA ASSESSMENTS:   SURGERY TYPE/DATE: Right Mastectomy 11/28/2020  NUMBER OF LYMPH NODES REMOVED: 4  CHEMOTHERAPY: Yes  RADIATION:Yes  HORMONE TREATMENT: NO  INFECTIONS: NO  LYMPHEDEMA ASSESSMENTS:   LANDMARK RIGHT  eval  10 cm proximal to olecranon process 26.7  Olecranon process 23.6  10 cm proximal to ulnar styloid process 17.5  Just proximal to ulnar styloid process 14.4  Across hand at thumb web space 17.3  At base of 2nd digit 5.8  (Blank rows = not tested)  LANDMARK LEFT  eval  10 cm proximal to olecranon process 28.4  Olecranon process 24.3  10 cm proximal to ulnar styloid process 17.9  Just proximal to ulnar styloid process 14.6  Across hand at thumb web space 17.3  At base of 2nd digit 5.5  (Blank rows = not tested)      QUICK DASH SURVEY: 14%   TODAY'S TREATMENT:                                                                                                                                         DATE:  10/31/2022 Soft tissue mobilization to right UT, Pectorals, lateral trunk with cocoa butter Scar massage to mastectomy incision, MFR in different directions to right chest area Cupping to right scapular area and right mastectomy incision above and below. PROM right shoulder with MFR to axilla LTR with arms outstretched x 4 knees to left Supine scapular series with yellow band; flexion, horizontal abd, ER all x 10 except sword x 5 Pec doorwary stretch x 2   10/23/2022 Soft tissue mobilization to right UT, Pectorals, lateral trunk, and in SL to scapular area with cocoa butter. Scar massage to mastectomy incision, MFR in different directions to right chest area Cupping to  right scapular area and right mastectomy incision above and below. PROM right shoulder with MFR to axilla    10/18/2022 Soft tissue mobilization to right UT, Pectorals, lateral trunk, and in SL to scapular area with cocoa butter. Scar massage to mastectomy incision, MFR in different directions to right chest area Cupping to right scapular area and right mastectomy incision above and below. PROM right shoulder with MFR to axilla  10/16/2022 Soft tissue mobilization to right UT, Pectorals, lateral trunk with cocoa butter Scar massage to mastectomy incision, MFR in different directions to right chest area LTR x 4 ea side with arms outstretched and goal post arms Open book stretch x 5 PROM right shoulder flexion, scaption, abduction Reaching for cones to far right 2 x 4, and to far left reaching with Right UE to simulate reach to night stand 2 x 4. Able to do without increased pain in chest.    10/11/2022 Soft tissue  mobilization to right UT, Pectorals, lateral trunk with cocoa butter Scar massage to mastectomy incision, MFR in different directions to right chest area Supine wand flexion and scap x4 ea with 5 sec hold AROM bilateral UE's x 5 for flexion, scaption, abduction Open book stretch x 5 PROM right shoulder flexion, scaption, abduction Pectoralis doorway stretch single and double arm x 2 Pt to simulate reaching to night stand to see if she has improved.   10/09/2022 Soft tissue mobilization to right UT, Pectorals, lateral trunk with cocoa butter Scar massage to mastectomy incision, MFR in different directions to right chest area Supine wand flexion and scap x 3-4 ea with 5 sec hold LTR x 5 with arms outstretched and goal post arms PROM right shoulder flexion, scaption, abduction Updated HEP with supine wand flex and scaption  10/01/2022  PATIENT EDUCATION:    10/31/2022; supine scapular series with yellow x 10 except sword x 5 ea 10/10/2022: updated HEP with supine wand  flexion and scaption Education details: pt to perform gentle stretches she is already doing but to add an additional session in the afternoon/evening. Educated in LTR with arms outstretched and and gave handout. Also educated in scar massage to mastectomy incision. Person educated: Patient Education method: Chief Technology Officer Education comprehension: verbalized understanding and returned demonstration  HOME EXERCISE PROGRAM: LTR with arms outstretched, stargazer, scar massage, supine wand flex and scap, supine scapular series  ASSESSMENT:  CLINICAL IMPRESSION:   .Pt was out of town for a long weekend and did not get in much stretching. Incision area is definitely more mobile and she feels better after cupping and manual work. She did well with supine scapular series exs and was given illustrations and a yellow band. She is still getting intermittent chest spasm with reaching to nightstand.  OBJECTIVE IMPAIRMENTS: decreased activity tolerance, decreased mobility, decreased ROM, impaired UE functional use, postural dysfunction, and pain.   ACTIVITY LIMITATIONS: reach over head and to the side  PARTICIPATION LIMITATIONS:  Pt is doing all that is required but is cautious with reaching  PERSONAL FACTORS: 1-2 comorbidities: Right breast triple positive Breast cancer s/p right mastectomy with chemo and radiation  are also affecting patient's functional outcome.   REHAB POTENTIAL: Good  CLINICAL DECISION MAKING: Stable/uncomplicated  EVALUATION COMPLEXITY: Low  GOALS: Goals reviewed with patient? Yes  SHORT TERM GOALS=LONG TERM GOALS: Target date: 5/202/2024  Pt will be independent in a HEP for gentle stretching of  mastectomy scar and right upper quarter to relieve pain Baseline: Goal status: MET 10/18/2022 2.  Pt will note decreased tightness/pain by atleast 50% Baseline:  Goal status: INITIAL  3.  Pt will be able to reach to her nightstand without sharp pain Baseline:  Goal  status: INITIAL  4.  Pts right shoulder flexion and abduction wiill be WNL Baseline: flex 144, abd 163 Goal status: MET 10/23/2022    PLAN:  PT FREQUENCY: 2x/week  PT DURATION: 6 weeks  PLANNED INTERVENTIONS: Therapeutic exercises, Therapeutic activity, Patient/Family education, Self Care, Joint mobilization, Orthotic/Fit training, Dry Needling, scar mobilization, Manual therapy, and Re-evaluation  PLAN FOR NEXT SESSION: consider cupping around incision, scap. area,STM to right UQ to decrease tissue tension, supine wand scaption, scar massage, MFR to right chest area, PROM. (Give script for Sleeve) Progress to strength when ready. Pt will be travelling some (NO SOZO noted in chart due to plate in wrist)   Waynette Buttery, PT 10/31/2022, 12:01 PM

## 2022-10-31 NOTE — Patient Instructions (Addendum)

## 2022-11-02 ENCOUNTER — Inpatient Hospital Stay (HOSPITAL_BASED_OUTPATIENT_CLINIC_OR_DEPARTMENT_OTHER): Payer: BC Managed Care – PPO | Admitting: Hematology and Oncology

## 2022-11-02 ENCOUNTER — Other Ambulatory Visit: Payer: Self-pay | Admitting: *Deleted

## 2022-11-02 ENCOUNTER — Other Ambulatory Visit: Payer: Self-pay

## 2022-11-02 ENCOUNTER — Inpatient Hospital Stay: Payer: BC Managed Care – PPO | Attending: Hematology and Oncology

## 2022-11-02 VITALS — BP 113/57 | HR 56 | Temp 97.7°F | Resp 16 | Wt 152.2 lb

## 2022-11-02 DIAGNOSIS — C50811 Malignant neoplasm of overlapping sites of right female breast: Secondary | ICD-10-CM

## 2022-11-02 DIAGNOSIS — G620 Drug-induced polyneuropathy: Secondary | ICD-10-CM | POA: Diagnosis not present

## 2022-11-02 DIAGNOSIS — D696 Thrombocytopenia, unspecified: Secondary | ICD-10-CM

## 2022-11-02 DIAGNOSIS — Z170 Estrogen receptor positive status [ER+]: Secondary | ICD-10-CM | POA: Insufficient documentation

## 2022-11-02 DIAGNOSIS — Z79811 Long term (current) use of aromatase inhibitors: Secondary | ICD-10-CM | POA: Insufficient documentation

## 2022-11-02 LAB — CBC WITH DIFFERENTIAL (CANCER CENTER ONLY)
Abs Immature Granulocytes: 0.01 10*3/uL (ref 0.00–0.07)
Basophils Absolute: 0 10*3/uL (ref 0.0–0.1)
Basophils Relative: 1 %
Eosinophils Absolute: 0.1 10*3/uL (ref 0.0–0.5)
Eosinophils Relative: 3 %
HCT: 37.4 % (ref 36.0–46.0)
Hemoglobin: 12.4 g/dL (ref 12.0–15.0)
Immature Granulocytes: 0 %
Lymphocytes Relative: 17 %
Lymphs Abs: 0.8 10*3/uL (ref 0.7–4.0)
MCH: 31.5 pg (ref 26.0–34.0)
MCHC: 33.2 g/dL (ref 30.0–36.0)
MCV: 94.9 fL (ref 80.0–100.0)
Monocytes Absolute: 0.4 10*3/uL (ref 0.1–1.0)
Monocytes Relative: 9 %
Neutro Abs: 3.3 10*3/uL (ref 1.7–7.7)
Neutrophils Relative %: 70 %
Platelet Count: 157 10*3/uL (ref 150–400)
RBC: 3.94 MIL/uL (ref 3.87–5.11)
RDW: 12.2 % (ref 11.5–15.5)
WBC Count: 4.8 10*3/uL (ref 4.0–10.5)
nRBC: 0 % (ref 0.0–0.2)

## 2022-11-02 LAB — CMP (CANCER CENTER ONLY)
ALT: 19 U/L (ref 0–44)
AST: 31 U/L (ref 15–41)
Albumin: 4.4 g/dL (ref 3.5–5.0)
Alkaline Phosphatase: 90 U/L (ref 38–126)
Anion gap: 7 (ref 5–15)
BUN: 16 mg/dL (ref 8–23)
CO2: 28 mmol/L (ref 22–32)
Calcium: 9.3 mg/dL (ref 8.9–10.3)
Chloride: 106 mmol/L (ref 98–111)
Creatinine: 0.85 mg/dL (ref 0.44–1.00)
GFR, Estimated: >60 mL/min (ref >60)
Glucose, Bld: 56 mg/dL — ABNORMAL LOW (ref 70–99)
Potassium: 3.5 mmol/L (ref 3.5–5.1)
Sodium: 141 mmol/L (ref 135–145)
Total Bilirubin: 0.5 mg/dL (ref 0.3–1.2)
Total Protein: 7.6 g/dL (ref 6.5–8.1)

## 2022-11-02 LAB — MAGNESIUM: Magnesium: 2 mg/dL (ref 1.7–2.4)

## 2022-11-02 NOTE — Assessment & Plan Note (Signed)
06/21/2020:Patient palpated a right breast mass x 1 wk. Mammogram showed multiple confluent masses in the right breast at the 7 o'clock position measuring 2.3cm and at the 9 o'clock position measuring 2.2cm with one right axillary lymph node with cortical thickening. Biopsy showed invasive mammary carcinoma at both positions and in the axilla, grade 2-3, HER-2 equivocal by IHC (2+), positive by FISH (ratio 3.34), ER+ >95%, PR+ 25%, Ki67 60%.   Treatment plan: 1. Neoadjuvant chemotherapy with TCH Perjeta 6 cycles completed 10/26/2020 followed by Herceptin Perjeta versus [name] for 1 year completed 05/23/2021 2.  Right mastectomy Dwain Sarna): IDC, grade 3, spanning 4.0cm fibrotic area, clear margins, 1/4 right axillary lymph nodes positive for carcinoma, 0.5cm. Left lumpectomy: no evidence of malignancy 3. Followed by adjuvant radiation therapy completed 02/17/2021 4.  Followed by adjuvant antiestrogen therapy started 12/29/2020 5.  Followed by neratinib (started 12/28/2021) ---------------------------------------------------------------------------------------------------------------------------------- Current Treatment:letrozole  COVID infection: August 2023: Treated with antiviral medication. (Molnupiravir) Letrozole toxicities: Tolerating it extremely well.    Surveillance:  1. Mammograms June 08, 2021 at [facility] Benign Bone density: November 2022: T score -2.4:  2. Breast MRI: 11/17/2021: Benign breast density category C   Current Treatment: Neratinib held for COVID-19 infection: Restarted neratinib on 01/30/2022. Neratinib toxicities: 1.  Diarrhea is manageable when she takes 5 tablets a day.  I instructed her to go down to 5 tablets a day.  This will be her maintenance dose. 2. mild dizziness: Probably orthostatic in nature. 3.  Mild fatigue  4.  Mild peripheral neuropathy in the toes: Watchful monitoring   Multiple and frequent UTIs: I gave her prescription for Estrace cream to  prevent vaginal dryness. Breast MRI 10/31/2022: No MRI evidence of malignancy.  Prior right mastectomy.  Left breast Mammogram recommended  Return to clinic in June to see Jonny Ruiz

## 2022-11-05 ENCOUNTER — Telehealth: Payer: Self-pay | Admitting: Hematology and Oncology

## 2022-11-05 NOTE — Telephone Encounter (Signed)
Scheduled appointments per 5/10 los. Left voicemail.

## 2022-11-06 ENCOUNTER — Encounter: Payer: Self-pay | Admitting: Physical Therapy

## 2022-11-06 DIAGNOSIS — Z23 Encounter for immunization: Secondary | ICD-10-CM | POA: Diagnosis not present

## 2022-11-06 DIAGNOSIS — Z0000 Encounter for general adult medical examination without abnormal findings: Secondary | ICD-10-CM | POA: Diagnosis not present

## 2022-11-06 DIAGNOSIS — N3020 Other chronic cystitis without hematuria: Secondary | ICD-10-CM | POA: Diagnosis not present

## 2022-11-06 DIAGNOSIS — E559 Vitamin D deficiency, unspecified: Secondary | ICD-10-CM | POA: Diagnosis not present

## 2022-11-06 DIAGNOSIS — E785 Hyperlipidemia, unspecified: Secondary | ICD-10-CM | POA: Diagnosis not present

## 2022-11-07 ENCOUNTER — Ambulatory Visit: Payer: BC Managed Care – PPO | Admitting: Physical Therapy

## 2022-11-08 ENCOUNTER — Ambulatory Visit: Payer: BC Managed Care – PPO

## 2022-11-08 ENCOUNTER — Other Ambulatory Visit (HOSPITAL_COMMUNITY): Payer: Self-pay

## 2022-11-08 DIAGNOSIS — Z483 Aftercare following surgery for neoplasm: Secondary | ICD-10-CM

## 2022-11-08 DIAGNOSIS — M25611 Stiffness of right shoulder, not elsewhere classified: Secondary | ICD-10-CM

## 2022-11-08 DIAGNOSIS — M6281 Muscle weakness (generalized): Secondary | ICD-10-CM | POA: Diagnosis not present

## 2022-11-08 DIAGNOSIS — Z170 Estrogen receptor positive status [ER+]: Secondary | ICD-10-CM | POA: Diagnosis not present

## 2022-11-08 DIAGNOSIS — M62838 Other muscle spasm: Secondary | ICD-10-CM | POA: Diagnosis not present

## 2022-11-08 DIAGNOSIS — C50811 Malignant neoplasm of overlapping sites of right female breast: Secondary | ICD-10-CM | POA: Diagnosis not present

## 2022-11-08 NOTE — Therapy (Signed)
OUTPATIENT PHYSICAL THERAPY ONCOLOGY EVALUATION  Patient Name: Teresa Sheppard MRN: [id number] DOB:09/10/1960, 62 y.o., female Today's Date: 11/08/2022  END OF SESSION:  PT End of Session - 11/08/22 1104     Visit Number 8   3cancer   Number of Visits 12    Date for PT Re-Evaluation 11/12/22    Authorization - Visit Number 11    Authorization - Number of Visits 30    PT Start Time 1106    PT Stop Time 1154    PT Time Calculation (min) 48 min    Activity Tolerance Patient tolerated treatment well    Behavior During Therapy [facility] for tasks assessed/performed              Past Medical History:  Diagnosis Date   Anxiety    Breast cancer in female [facility])    Right   Family history of breast cancer 11/07/2020   Family history of pancreatic cancer 11/07/2020   History of kidney stones 2014-2015   Hyperthyroidism    No longer an issue   PONV (postoperative nausea and vomiting)    Thyromegaly    Past Surgical History:  Procedure Laterality Date   ABDOMINAL WALL DEFECT REPAIR     following hernia repair in 2010, no mesh was used   ABDOMINOPLASTY     with hernia repair   APPENDECTOMY     BOWEL RESECTION     2008  while pregnant with last child. Scar tissue from appendectomy wrapped around bowel and was snipped to free bowel.    CESAREAN SECTION     DILATION AND CURETTAGE OF UTERUS     2000 and 2014   HERNIA REPAIR     MASTECTOMY W/ SENTINEL NODE BIOPSY Right 11/28/2020   Procedure: RIGHT MASTECTOMY WITH RIGHT AXILLARY SENTINEL LYMPH NODE BIOPSY;  Surgeon: Emelia Loron, MD;  Location: [facility] OR;  Service: General;  Laterality: Right;   PORT-A-CATH REMOVAL Left 01/30/2021   Procedure: PORT REMOVAL;  Surgeon: Emelia Loron, MD;  Location: [facility] OR;  Service: General;  Laterality: Left;  LOCAL   PORTACATH PLACEMENT Left 07/12/2020   Procedure: INSERTION PORT-A-CATH WITH ULTRASOUND GUIDANCE;  Surgeon: Emelia Loron, MD;  Location: [facility] OR;  Service: General;  Laterality:  Left;   RADIOACTIVE SEED GUIDED AXILLARY SENTINEL LYMPH NODE Right 11/28/2020   Procedure: RADIOACTIVE SEED GUIDED RIGHT AXILLARY AXILLARY SENTINEL LYMPH NODE EXCISION;  Surgeon: Emelia Loron, MD;  Location: [facility] OR;  Service: General;  Laterality: Right;   RADIOACTIVE SEED GUIDED EXCISIONAL BREAST BIOPSY Left 11/28/2020   Procedure: RADIOACTIVE SEED GUIDED LEFT EXCISIONAL BREAST BIOPSY;  Surgeon: Emelia Loron, MD;  Location: [facility] OR;  Service: General;  Laterality: Left;   ROOT CANAL     2019   TONSILLECTOMY  1969   URETERAL EXPLORATION     dilataion for congenitally small ureter   Patient Active Problem List   Diagnosis Date Noted   Chemotherapy-induced peripheral neuropathy (HCC) 10/24/2021   Vitamin D deficiency 08/30/2021   Osteoporosis 08/30/2021   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   Genetic testing 11/15/2020   Family history of breast cancer 11/07/2020   Family history of pancreatic cancer 11/07/2020   Dysphagia 10/26/2020   Malignant neoplasm of overlapping sites of right breast in female, estrogen receptor positive (HCC) 06/21/2020   Hyperthyroidism 03/28/2016      REFERRING PROVIDER: Emelia Loron, MD  REFERRING DIAG: s/p Right Breast Cancer  THERAPY DIAG:  Aftercare following surgery for neoplasm  Stiffness of right shoulder, not elsewhere  classified  Malignant neoplasm of overlapping sites of right breast in female, estrogen receptor positive [facility])  ONSET DATE: November 2023  Rationale for Evaluation and Treatment: Rehabilitation  SUBJECTIVE:                                                                                                                                                                                           SUBJECTIVE STATEMENT: I have done the band exs 3x/week. I have been doing some of the stretches here and there. Its been busy. My chest hasn't really bothered me that much. The tenderness is a lot better  too.  PERTINENT HISTORY:    R breast cancer, left outer quadrant, triple positive, Ki 67 - 60%, grade 2 -3 IDC, neoadjuvant chemo and immuno therapy,  right mastectomy on 11/28/2020 with 4 nodes removed on the right. She also had radiation ending 02/17/2021. and she had an excision of breast tissue on the left that was benign   PAIN:  Are you having pain? Not right now, tender in axillary and chest region on right. NPRS scale: 0/10 presently Pain location: Under mastectomy incision Pain orientation: Right  PAIN TYPE: sharp and tight Pain description: intermittent  Aggravating factors: reaching to night stand, when first starting to stretch its tight Relieving factors: not reaching  PRECAUTIONS: Right UE lymphedema risk  WEIGHT BEARING RESTRICTIONS: No  FALLS:  Has patient fallen in last 6 months? 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 A little restricted at lateral border, and more tender  SENSATION: Light touch: Deficits light numbness posterior right arm and axilla  POSTURE: forward head, rounded shoulders  UPPER EXTREMITY AROM/PROM:  A/PROM RIGHT   eval  RIGHT 10/23/22  Shoulder extension 60   Shoulder flexion 144 156  Shoulder abduction 163 174  Shoulder internal rotation 70   Shoulder  external rotation 98     (Blank rows = not tested)  A/PROM LEFT   eval  Shoulder extension 58  Shoulder flexion  153  Shoulder abduction 170  Shoulder internal rotation 70  Shoulder external rotation 96    (Blank rows = not tested)  CERVICAL AROM: All within normal limits:    UPPER EXTREMITY STRENGTH: WNL   LYMPHEDEMA ASSESSMENTS:   SURGERY TYPE/DATE: Right Mastectomy 11/28/2020  NUMBER OF LYMPH NODES REMOVED: 4  CHEMOTHERAPY: Yes  RADIATION:Yes  HORMONE TREATMENT: NO  INFECTIONS: NO  LYMPHEDEMA ASSESSMENTS:   LANDMARK RIGHT  eval  10 cm proximal to olecranon process 26.7  Olecranon process 23.6  10 cm proximal to ulnar styloid process 17.5  Just proximal to ulnar styloid process 14.4  Across hand at thumb web space 17.3  At base of 2nd digit 5.8  (Blank rows = not tested)  LANDMARK LEFT  eval  10 cm proximal to olecranon process 28.4  Olecranon process 24.3  10 cm proximal to ulnar styloid process 17.9  Just proximal to ulnar styloid process 14.6  Across hand at thumb web space 17.3  At base of 2nd digit 5.5  (Blank rows = not tested)      QUICK DASH SURVEY: 14%   TODAY'S TREATMENT:                                                                                                                                         DATE:  11/08/2022  Soft tissue mobilization to right UT, Pectorals, lateral trunk with cocoa butter Scar massage to mastectomy incision, MFR in different directions to right chest area Cupping to right scapular area and right mastectomy incision above and below. PROM right shoulder flex, scaption, abd, IR and ER with MFR to axilla Snow angels x 10, LTR knees to left x2 ea arms extended and goal post  10/31/2022 Soft tissue mobilization to right UT, Pectorals, lateral trunk with cocoa butter Scar massage to mastectomy incision, MFR in different directions to right chest area Cupping to right scapular area and right mastectomy incision  above and below. PROM right shoulder with MFR to axilla LTR with arms outstretched x 4 knees to left Supine scapular series with yellow band; flexion, horizontal abd, ER all x 10 except sword x 5 Pec doorwary stretch x 2   10/23/2022 Soft tissue mobilization to right UT, Pectorals, lateral trunk, and in SL to scapular area with cocoa butter. Scar massage to mastectomy incision, MFR in different directions to right chest area Cupping to right scapular area and right mastectomy incision above and below. PROM right shoulder with MFR to axilla    10/18/2022 Soft tissue mobilization to right UT, Pectorals, lateral trunk, and in SL to scapular area with cocoa butter. Scar massage to mastectomy incision, MFR in different directions to right chest area Cupping to right scapular area and right mastectomy incision above and below. PROM right shoulder with MFR to axilla  10/16/2022 Soft tissue mobilization to right UT, Pectorals, lateral trunk with cocoa butter Scar massage  to mastectomy incision, MFR in different directions to right chest area LTR x 4 ea side with arms outstretched and goal post arms Open book stretch x 5 PROM right shoulder flexion, scaption, abduction Reaching for cones to far right 2 x 4, and to far left reaching with Right UE to simulate reach to night stand 2 x 4. Able to do without increased pain in chest.    10/11/2022 Soft tissue mobilization to right UT, Pectorals, lateral trunk with cocoa butter Scar massage to mastectomy incision, MFR in different directions to right chest area Supine wand flexion and scap x4 ea with 5 sec hold AROM bilateral UE's x 5 for flexion, scaption, abduction Open book stretch x 5 PROM right shoulder flexion, scaption, abduction Pectoralis doorway stretch single and double arm x 2 Pt to simulate reaching to night stand to see if she has improved.   10/09/2022 Soft tissue mobilization to right UT, Pectorals, lateral trunk with cocoa  butter Scar massage to mastectomy incision, MFR in different directions to right chest area Supine wand flexion and scap x 3-4 ea with 5 sec hold LTR x 5 with arms outstretched and goal post arms PROM right shoulder flexion, scaption, abduction Updated HEP with supine wand flex and scaption  10/01/2022  PATIENT EDUCATION:    10/31/2022; supine scapular series with yellow x 10 except sword x 5 ea 10/10/2022: updated HEP with supine wand flexion and scaption Education details: pt to perform gentle stretches she is already doing but to add an additional session in the afternoon/evening. Educated in LTR with arms outstretched and and gave handout. Also educated in scar massage to mastectomy incision. Person educated: Patient Education method: Chief Technology Officer Education comprehension: verbalized understanding and returned demonstration  HOME EXERCISE PROGRAM: LTR with arms outstretched, stargazer, scar massage, supine wand flex and scap, supine scapular series  ASSESSMENT:  CLINICAL IMPRESSION:   Pt has not noticed the chest pain nearly as often. She was tight today in the right UT, pectorals and scapular area but felt better at completion of rx. She has been compliant with HEP   OBJECTIVE IMPAIRMENTS: decreased activity tolerance, decreased mobility, decreased ROM, impaired UE functional use, postural dysfunction, and pain.   ACTIVITY LIMITATIONS: reach over head and to the side  PARTICIPATION LIMITATIONS:  Pt is doing all that is required but is cautious with reaching  PERSONAL FACTORS: 1-2 comorbidities: Right breast triple positive Breast cancer s/p right mastectomy with chemo and radiation  are also affecting patient's functional outcome.   REHAB POTENTIAL: Good  CLINICAL DECISION MAKING: Stable/uncomplicated  EVALUATION COMPLEXITY: Low  GOALS: Goals reviewed with patient? Yes  SHORT TERM GOALS=LONG TERM GOALS: Target date: 5/202/2024  Pt will be independent in a  HEP for gentle stretching of  mastectomy scar and right upper quarter to relieve pain Baseline: Goal status: MET 10/18/2022 2.  Pt will note decreased tightness/pain by atleast 50% Baseline:  Goal status: INITIAL  3.  Pt will be able to reach to her nightstand without sharp pain Baseline:  Goal status: INITIAL  4.  Pts right shoulder flexion and abduction wiill be WNL Baseline: flex 144, abd 163 Goal status: MET 10/23/2022    PLAN:  PT FREQUENCY: 2x/week  PT DURATION: 6 weeks  PLANNED INTERVENTIONS: Therapeutic exercises, Therapeutic activity, Patient/Family education, Self Care, Joint mobilization, Orthotic/Fit training, Dry Needling, scar mobilization, Manual therapy, and Re-evaluation  PLAN FOR NEXT SESSION: recert/DC? Give red band,consider cupping around incision, scap. area,STM to right UQ to decrease tissue  tension, supine wand scaption, scar massage, MFR to right chest area, PROM. (Give script for Sleeve) Progress to strength when ready. Pt will be travelling some (NO SOZO noted in chart due to plate in wrist)   Waynette Buttery, PT 11/08/2022, 11:59 AM

## 2022-11-10 ENCOUNTER — Other Ambulatory Visit (HOSPITAL_COMMUNITY): Payer: Self-pay

## 2022-11-12 ENCOUNTER — Encounter: Payer: Self-pay | Admitting: Obstetrics and Gynecology

## 2022-11-12 NOTE — Telephone Encounter (Deleted)
Please call and inform patient she has BV. Antibiotic sent to pharmacy.

## 2022-11-13 ENCOUNTER — Ambulatory Visit: Payer: BC Managed Care – PPO

## 2022-11-14 ENCOUNTER — Encounter: Payer: Self-pay | Admitting: Physical Therapy

## 2022-11-14 ENCOUNTER — Ambulatory Visit: Payer: BC Managed Care – PPO | Admitting: Physical Therapy

## 2022-11-14 DIAGNOSIS — Z483 Aftercare following surgery for neoplasm: Secondary | ICD-10-CM

## 2022-11-14 DIAGNOSIS — M25611 Stiffness of right shoulder, not elsewhere classified: Secondary | ICD-10-CM | POA: Diagnosis not present

## 2022-11-14 DIAGNOSIS — C50811 Malignant neoplasm of overlapping sites of right female breast: Secondary | ICD-10-CM | POA: Diagnosis not present

## 2022-11-14 DIAGNOSIS — M6281 Muscle weakness (generalized): Secondary | ICD-10-CM | POA: Diagnosis not present

## 2022-11-14 DIAGNOSIS — Z170 Estrogen receptor positive status [ER+]: Secondary | ICD-10-CM | POA: Diagnosis not present

## 2022-11-14 DIAGNOSIS — M62838 Other muscle spasm: Secondary | ICD-10-CM | POA: Diagnosis not present

## 2022-11-14 NOTE — Patient Instructions (Signed)
Urge Incontinence  Ideal urination frequency is every 2-4 wakeful hours, which equates to 5-8 times within a 24-hour period.   Urge incontinence is leakage that occurs when the bladder muscle contracts, creating a sudden need to go before getting to the bathroom.   Going too often when your bladder isn't actually full can disrupt the body's automatic signals to store and hold urine longer, which will increase urgency/frequency.  In this case, the bladder "is running the show" and strategies can be learned to retrain this pattern.   One should be able to control the first urge to urinate, at around .  The bladder can hold up to a "grande latte," or . To help you gain control, practice the Urge Drill below when urgency strikes.  This drill will help retrain your bladder signals and allow you to store and hold urine longer.  The overall goal is to stretch out your time between voids to reach a more manageable voiding schedule.    Practice your "quick flicks" often throughout the day (each waking hour) even when you don't need feel the urge to go.  This will help strengthen your pelvic floor muscles, making them more effective in controlling leakage.  Urge Drill  When you feel an urge to go, follow these steps to regain control: Stop what you are doing and be still Take one deep breath, directing your air into your abdomen Think an affirming thought, such as "I've got this." Do 5 quick flicks of your pelvic floor Then do something to take your mind off on urinating.  Walk with control to the bathroom to void, or delay voiding    Trigger Point Dry Needling  What is Trigger Point Dry Needling (DN)? DN is a physical therapy technique used to treat muscle pain and dysfunction. Specifically, DN helps deactivate muscle trigger points (muscle knots).  A thin filiform needle is used to penetrate the skin and stimulate the underlying trigger point. The goal is for a local twitch response (LTR)  to occur and for the trigger point to relax. No medication of any kind is injected during the procedure.   What Does Trigger Point Dry Needling Feel Like?  The procedure feels different for each individual patient. Some patients report that they do not actually feel the needle enter the skin and overall the process is not painful. Very mild bleeding may occur. However, many patients feel a deep cramping in the muscle in which the needle was inserted. This is the local twitch response.   How Will I feel after the treatment? Soreness is normal, and the onset of soreness may not occur for a few hours. Typically this soreness does not last longer than two days.  Bruising is uncommon, however; ice can be used to decrease any possible bruising.  In rare cases feeling tired or nauseous after the treatment is normal. In addition, your symptoms may get worse before they get better, this period will typically not last longer than 24 hours.   What Can I do After My Treatment? Increase your hydration by drinking more water for the next 24 hours. You may place ice or heat on the areas treated that have become sore, however, do not use heat on inflamed or bruised areas. Heat often brings more relief post needling. You can continue your regular activities, but vigorous activity is not recommended initially after the treatment for 24 hours. DN is best combined with other physical therapy such as strengthening, stretching, and other therapies.  [facility]  [address] [place] [address].  [phone]

## 2022-11-14 NOTE — Therapy (Signed)
OUTPATIENT PHYSICAL THERAPY TREATMENT NOTE   Patient Name: Teresa Sheppard MRN: [id number] DOB:08-Feb-1961, 62 y.o., female Today's Date: 11/14/2022  PCP: Georgann Housekeeper, MD  REFERRING PROVIDER: Selmer Dominion, NP   END OF SESSION:   PT End of Session - 11/14/22 0851     Visit Number 4   4 pelvic,   Date for PT Re-Evaluation 01/08/23   breast 11/12/22   Authorization Type [employer]    Authorization - Visit Number 12    Authorization - Number of Visits 30    PT Start Time 0845    PT Stop Time 0925    PT Time Calculation (min) 40 min    Activity Tolerance Patient tolerated treatment well    Behavior During Therapy [facility] for tasks assessed/performed             Past Medical History:  Diagnosis Date   Anxiety    Breast cancer in female [facility])    Right   Family history of breast cancer 11/07/2020   Family history of pancreatic cancer 11/07/2020   History of kidney stones 2014-2015   Hyperthyroidism    No longer an issue   PONV (postoperative nausea and vomiting)    Thyromegaly    Past Surgical History:  Procedure Laterality Date   ABDOMINAL WALL DEFECT REPAIR     following hernia repair in 2010, no mesh was used   ABDOMINOPLASTY     with hernia repair   APPENDECTOMY     BOWEL RESECTION     2008  while pregnant with last child. Scar tissue from appendectomy wrapped around bowel and was snipped to free bowel.    CESAREAN SECTION     DILATION AND CURETTAGE OF UTERUS     2000 and 2014   HERNIA REPAIR     MASTECTOMY W/ SENTINEL NODE BIOPSY Right 11/28/2020   Procedure: RIGHT MASTECTOMY WITH RIGHT AXILLARY SENTINEL LYMPH NODE BIOPSY;  Surgeon: Emelia Loron, MD;  Location: [facility] OR;  Service: General;  Laterality: Right;   PORT-A-CATH REMOVAL Left 01/30/2021   Procedure: PORT REMOVAL;  Surgeon: Emelia Loron, MD;  Location: [facility] OR;  Service: General;  Laterality: Left;  LOCAL   PORTACATH PLACEMENT Left 07/12/2020   Procedure: INSERTION PORT-A-CATH WITH ULTRASOUND  GUIDANCE;  Surgeon: Emelia Loron, MD;  Location: [facility] OR;  Service: General;  Laterality: Left;   RADIOACTIVE SEED GUIDED AXILLARY SENTINEL LYMPH NODE Right 11/28/2020   Procedure: RADIOACTIVE SEED GUIDED RIGHT AXILLARY AXILLARY SENTINEL LYMPH NODE EXCISION;  Surgeon: Emelia Loron, MD;  Location: [facility] OR;  Service: General;  Laterality: Right;   RADIOACTIVE SEED GUIDED EXCISIONAL BREAST BIOPSY Left 11/28/2020   Procedure: RADIOACTIVE SEED GUIDED LEFT EXCISIONAL BREAST BIOPSY;  Surgeon: Emelia Loron, MD;  Location: [facility] OR;  Service: General;  Laterality: Left;   ROOT CANAL     2019   TONSILLECTOMY  1969   URETERAL EXPLORATION     dilataion for congenitally small ureter   Patient Active Problem List   Diagnosis Date Noted   Chemotherapy-induced peripheral neuropathy (HCC) 10/24/2021   Vitamin D deficiency 08/30/2021   Osteoporosis 08/30/2021   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   Genetic testing 11/15/2020   Family history of breast cancer 11/07/2020   Family history of pancreatic cancer 11/07/2020   Dysphagia 10/26/2020   Malignant neoplasm of overlapping sites of right breast in female, estrogen receptor positive (HCC) 06/21/2020   Hyperthyroidism 03/28/2016   REFERRING PROVIDER: Selmer Dominion, NP    REFERRING DIAG:  N32.81 (ICD-10-CM) -  Overactive bladder  M62.838 (ICD-10-CM) - Levator spasm      THERAPY DIAG:              Other muscle spasm Z61,096             Muscle weakness ( generalized) M62.81             Aftercare following surgery for neoplasm Z48.3   Rationale for Evaluation and Treatment: Rehabilitation   ONSET DATE: 2023   SUBJECTIVE:                                                                                                                                                                                            SUBJECTIVE STATEMENT: Mybetric is helping with the pain. Only had it 1 time so far. Patient is waking up many times at night.  I have not used the wand again due to it irritating things more. I will bring to therapy.      PAIN:  Are you having pain? Yes NPRS scale: 3/10 Pain location: External and opening, suprapubic   Pain type: aching and shooting  Pain description: intermittent    Aggravating factors: when urine coming out of the bladder  Relieving factors: drink more water, pelvic floor relaxation   PRECAUTIONS: Other: breast cancer   WEIGHT BEARING RESTRICTIONS: No   FALLS:  Has patient fallen in last 6 months? No   LIVING ENVIRONMENT: Lives with: lives with their family   OCCUPATION: sitting   PLOF: Independent   PATIENT GOALS: reduce the number of times going to the bathroom   PERTINENT HISTORY:  Dilation and curettage of uterus; Bowel resection; Ureteral exploration; Appendectomy; Cesarean section; Abdominal wall defect repair; Hernia repair; Portacath placement (Left, 07/12/2020); Mastectomy w/ sentinel node biopsy (Right, 11/28/2020); Radioactive seed guided axillary sentinel lymph node (Right, 11/28/2020); Radioactive seed guided excisional breast biopsy (Left, 11/28/2020); Port-a-cath removal (Left, 01/30/2021); and Abdominoplasty. Breast cancer     BOWEL MOVEMENT: no issues with bowel movements Pain with bowel movement: No   URINATION: Pain with urination: discomfort during urination Fully empty bladder: Yes: no because 15 minutes later may have to go again Stream: Weak Urgency: Yes: at night and not as much during the day Frequency: night time voids 6-8 times; during the day goes every 2-3 hours Leakage:  none Pads: No   INTERCOURSE: not sexually active due to the pain    PREGNANCY: Vaginal deliveries 3 Tearing Yes:   C-section deliveries 1 Currently pregnant No   PROLAPSE: None     OBJECTIVE:    DIAGNOSTIC FINDINGS:  none     COGNITION: Overall cognitive status:  Within functional limits for tasks assessed                          SENSATION: Light touch:  Appears intact Proprioception: Appears intact     POSTURE: No Significant postural limitations   PELVIC ALIGNMENT:   LUMBARAROM/PROM:   A/PROM A/PROM  eval  Flexion full  Extension Decreased by 50%  Right lateral flexion Decreased by 25%  Left lateral flexion Decreased by 25%  Right rotation Decreased by 25%  Left rotation Decreased by 25%   (Blank rows = not tested)   LOWER EXTREMITY ROM:   Passive ROM Right eval Left eval  Hip external rotation 40 45   (Blank rows = not tested)   LOWER EXTREMITY MMT:   MMT Right eval Left eval  Hip flexion 4/5 4/5  Hip extension 3/5 3/5  Hip abduction 3/5 3/5  Hip adduction 4/5 4/5    PALPATION:   General  decreased movement of the lower rib cage and abdominal tightness                 External Perineal Exam firmness of the perineal tissue                             Internal Pelvic Floor tenderness in right obturator internist, along the sides of the urethra and bladder, iliococcygeus   Patient confirms identification and approves PT to assess internal pelvic floor and treatment Yes   PELVIC MMT:   MMT eval 10/30/22  Vaginal 3/5 3/5 with circular hug  (Blank rows = not tested)         TONE: increased   PROLAPSE: none   TODAY'S TREATMENT:    11/14/22 Manual: Soft tissue mobilization: To assess for dry needling Manual work to the left hip adductor to elongate after dry needling.  Internal pelvic floor techniques: No emotional/communication barriers or cognitive limitation. Patient is motivated to learn. Patient understands and agrees with treatment goals and plan. PT explains patient will be examined in standing, sitting, and lying down to see how their muscles and joints work. When they are ready, they will be asked to remove their underwear so PT can examine their perineum. The patient is also given the option of providing their own chaperone as one is not provided in our facility. The patient also has the right and  is explained the right to defer or refuse any part of the evaluation or treatment including the internal exam. With the patient's consent, PT will use one gloved finger to gently assess the muscles of the pelvic floor, seeing how well it contracts and relaxes and if there is muscle symmetry. After, the patient will get dressed and PT and patient will discuss exam findings and plan of care. PT and patient discuss plan of care, schedule, attendance policy and HEP activities.  To assess for dry needling Going through the vaginal canal working on the perineal body and ischiocavernosus to elongate after dry needling Trigger Point Dry-Needling  Treatment instructions: Expect mild to moderate muscle soreness. S/S of pneumothorax if dry needled over a lung field, and to seek immediate medical attention should they occur. Patient verbalized understanding of these instructions and education.  Patient Consent Given: Yes Education handout provided: Yes Muscles treated: left hip adductor, left ischiocavernosus, perineal body Electrical stimulation performed: No Parameters: N/A Treatment response/outcome: elongation of muscle and trigger point response  Neuromuscular re-education:  Pelvic floor contraction training: Therapist finger in the vaginal canal working on pelvic floor contraction Down training: Educated patient on ways to deter the urge to urinate at night and using different behavioral techniques.  Exercises: Strengthening: Recumbent bike for 5 minutes while assessing patient.   10/30/22 Manual: Internal pelvic floor techniques: No emotional/communication barriers or cognitive limitation. Patient is motivated to learn. Patient understands and agrees with treatment goals and plan. PT explains patient will be examined in standing, sitting, and lying down to see how their muscles and joints work. When they are ready, they will be asked to remove their underwear so PT can examine their perineum. The  patient is also given the option of providing their own chaperone as one is not provided in our facility. The patient also has the right and is explained the right to defer or refuse any part of the evaluation or treatment including the internal exam. With the patient's consent, PT will use one gloved finger to gently assess the muscles of the pelvic floor, seeing how well it contracts and relaxes and if there is muscle symmetry. After, the patient will get dressed and PT and patient will discuss exam findings and plan of care. PT and patient discuss plan of care, schedule, attendance policy and HEP activities.  Going through the vagina working on the sides of the introitus, superficial transverse perineum, iliococcygeus, along the obturator internist Fascial release along the sides of the bladder and urethra with one finger in the vaginal canal and the other on the lower abdomen Neuromuscular re-education: Pelvic floor contraction training: Pelvic floor contraction with therapist finger in the vaginal canal.  Down training: Diaphragmatic breathing with therapist finger in the vaginal canal working with pelvic drop      10/23/22 Manual: Soft tissue mobilization: Manual work to the diaphragm  Manual work to the abdomen Scar tissue mobilization: Using suction cup on the lower abdominal scar Lifting techniques of the scar Using suction cup on the scar to lift the tissue Myofascial release: Tissue rolling of the abdomen Release of the lateral abdomen to go through the restrictions Exercises: Stretches/mobility: Educated patient on using the vaginal wand internally to the pelvic floor, along the perineal body, and vulvar area to improve blood flow, tissue mobility, and reduce pain Diaphragmatic breathing to expand the lower rib cage with therapist giving tactile cues Sitting on the foam roll to massage the pelvic floor and work on pelvic floor drops                                                                                                                                PATIENT EDUCATION: 10/23/22 Education details: Access Code: [email or web address] Person educated: Patient Education method: Explanation, Demonstration, Tactile cues, Verbal cues, and Handouts Education comprehension: verbalized understanding, returned demonstration, verbal cues required, tactile cues required, and needs further education     HOME EXERCISE PROGRAM: 10/23/22 Access Code: [email or web address] URL: [email or web address] Date: 10/23/2022 Prepared  by: Eulis Foster   Program Notes vaginal wand 3 times per weeksit on foam roll daily moving side to side and practice your breathing on it.    Exercises - Supine Diaphragmatic Breathing  - 1 x daily - 7 x weekly - 1 sets - 10 reps - Seated Diaphragmatic Breathing  - 1 x daily - 7 x weekly - 1 sets - 10 reps   ASSESSMENT:   CLINICAL IMPRESSION: Patient is a 62 y.o. female who was seen today for physical therapy treatment for overactive bladder and levator spasm. Patient responded well to the dry needling and had elongation of the muscles. Her pelvic floor strength on the left side increased to 3/5. She learned on how to delay the urge to void at night and will focus on it till next visit. Patient will benefit from skilled therapy to reduce pelvic floor tone and trigger points so she is able to relax her pelvic floor and reduce irritation.    OBJECTIVE IMPAIRMENTS: decreased ROM, decreased strength, increased fascial restrictions, increased muscle spasms, and pain.    ACTIVITY LIMITATIONS: toileting   PARTICIPATION LIMITATIONS: community activity   PERSONAL FACTORS: Age and 3+ comorbidities: Dilation and curettage of uterus; Bowel resection; Ureteral exploration; Appendectomy; Cesarean section; Abdominal wall defect repair; Hernia repair; Portacath placement (Left, 07/12/2020); Mastectomy w/ sentinel node biopsy (Right, 11/28/2020); Radioactive seed guided  axillary sentinel lymph node (Right, 11/28/2020); Radioactive seed guided excisional breast biopsy (Left, 11/28/2020); Port-a-cath removal (Left, 01/30/2021); and Abdominoplasty. Breast cancer  are also affecting patient's functional outcome.    REHAB POTENTIAL: Good   CLINICAL DECISION MAKING: Stable/uncomplicated   EVALUATION COMPLEXITY: Low     GOALS: Goals reviewed with patient? Yes   SHORT TERM GOALS: Target date: 11/07/22   Patient understands how to use vaginal moisturizers to improve the tissue health.  Baseline: Goal status: Met 10/23/22   2.  Patient reports her vaginal irritation decreased >/= 25%.  Baseline:  Goal status: INITIAL   3.  Patient educated on vaginal wand to reduce there trigger points of the pelvic floor.  Baseline:  Goal status: Met 10/30/22   4.  Patient is able to perform pelvic floor drop with diaphragmatic breathing.  Baseline:  Goal status: INITIAL     LONG TERM GOALS: Target date: 01/07/23   Patient independent with advanced HEP for pelvic floor relaxation and core strength.  Baseline:  Goal status: INITIAL   2.  Patient wakes up </= 2 times per night due to reduction of urgency.  Baseline:  Goal status: INITIAL   3.  Patient is able to fully empty her bladder and not urinate again in 15 minutes due to the ability to fully relax her bladder.  Baseline:  Goal status: INITIAL   4.  Patient understands how to manage irritation of the bladder with relaxation and manual exercises.  Baseline:  Goal status: INITIAL       PLAN:   PT FREQUENCY: 1x/week   PT DURATION: 12 weeks   PLANNED INTERVENTIONS: Therapeutic exercises, Therapeutic activity, Neuromuscular re-education, Patient/Family education, Joint mobilization, Dry Needling, Electrical stimulation, Cryotherapy, Moist heat, Ultrasound, Biofeedback, and Manual therapy   PLAN FOR NEXT SESSION: see how it is going with the wand, dry needling to hip adductor and pelvic floor on right.   hip stretches to release the pelvic floor, manual work to the lower abdomen with hands and suction cup and pelvic floor   Eulis Foster, PT 11/14/22 8:53 AM

## 2022-11-15 ENCOUNTER — Ambulatory Visit: Payer: BC Managed Care – PPO

## 2022-11-15 DIAGNOSIS — M25611 Stiffness of right shoulder, not elsewhere classified: Secondary | ICD-10-CM

## 2022-11-15 DIAGNOSIS — Z170 Estrogen receptor positive status [ER+]: Secondary | ICD-10-CM

## 2022-11-15 DIAGNOSIS — Z483 Aftercare following surgery for neoplasm: Secondary | ICD-10-CM

## 2022-11-15 DIAGNOSIS — M62838 Other muscle spasm: Secondary | ICD-10-CM | POA: Diagnosis not present

## 2022-11-15 DIAGNOSIS — M6281 Muscle weakness (generalized): Secondary | ICD-10-CM | POA: Diagnosis not present

## 2022-11-15 DIAGNOSIS — C50811 Malignant neoplasm of overlapping sites of right female breast: Secondary | ICD-10-CM | POA: Diagnosis not present

## 2022-11-15 NOTE — Therapy (Signed)
OUTPATIENT PHYSICAL THERAPY ONCOLOGY TREATMENT  Patient Name: Teresa Sheppard MRN: [id number] DOB:04-Aug-1960, 62 y.o., female Today's Date: 11/15/2022  END OF SESSION:  PT End of Session - 11/15/22 1102     Visit Number 9   cancer, 4 pelvic   Number of Visits 12    Date for PT Re-Evaluation 11/15/22    Authorization Type [employer]    Authorization - Visit Number 13    PT Start Time 1103    PT Stop Time 1150    PT Time Calculation (min) 47 min    Activity Tolerance Patient tolerated treatment well    Behavior During Therapy WFL for tasks assessed/performed              Past Medical History:  Diagnosis Date   Anxiety    Breast cancer in female [facility])    Right   Family history of breast cancer 11/07/2020   Family history of pancreatic cancer 11/07/2020   History of kidney stones 2014-2015   Hyperthyroidism    No longer an issue   PONV (postoperative nausea and vomiting)    Thyromegaly    Past Surgical History:  Procedure Laterality Date   ABDOMINAL WALL DEFECT REPAIR     following hernia repair in 2010, no mesh was used   ABDOMINOPLASTY     with hernia repair   APPENDECTOMY     BOWEL RESECTION     2008  while pregnant with last child. Scar tissue from appendectomy wrapped around bowel and was snipped to free bowel.    CESAREAN SECTION     DILATION AND CURETTAGE OF UTERUS     2000 and 2014   HERNIA REPAIR     MASTECTOMY W/ SENTINEL NODE BIOPSY Right 11/28/2020   Procedure: RIGHT MASTECTOMY WITH RIGHT AXILLARY SENTINEL LYMPH NODE BIOPSY;  Surgeon: Emelia Loron, MD;  Location: [facility] OR;  Service: General;  Laterality: Right;   PORT-A-CATH REMOVAL Left 01/30/2021   Procedure: PORT REMOVAL;  Surgeon: Emelia Loron, MD;  Location: [facility] OR;  Service: General;  Laterality: Left;  LOCAL   PORTACATH PLACEMENT Left 07/12/2020   Procedure: INSERTION PORT-A-CATH WITH ULTRASOUND GUIDANCE;  Surgeon: Emelia Loron, MD;  Location: [facility] OR;  Service: General;  Laterality:  Left;   RADIOACTIVE SEED GUIDED AXILLARY SENTINEL LYMPH NODE Right 11/28/2020   Procedure: RADIOACTIVE SEED GUIDED RIGHT AXILLARY AXILLARY SENTINEL LYMPH NODE EXCISION;  Surgeon: Emelia Loron, MD;  Location: [facility] OR;  Service: General;  Laterality: Right;   RADIOACTIVE SEED GUIDED EXCISIONAL BREAST BIOPSY Left 11/28/2020   Procedure: RADIOACTIVE SEED GUIDED LEFT EXCISIONAL BREAST BIOPSY;  Surgeon: Emelia Loron, MD;  Location: [facility] OR;  Service: General;  Laterality: Left;   ROOT CANAL     2019   TONSILLECTOMY  1969   URETERAL EXPLORATION     dilataion for congenitally small ureter   Patient Active Problem List   Diagnosis Date Noted   Chemotherapy-induced peripheral neuropathy (HCC) 10/24/2021   Vitamin D deficiency 08/30/2021   Osteoporosis 08/30/2021   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   Genetic testing 11/15/2020   Family history of breast cancer 11/07/2020   Family history of pancreatic cancer 11/07/2020   Dysphagia 10/26/2020   Malignant neoplasm of overlapping sites of right breast in female, estrogen receptor positive (HCC) 06/21/2020   Hyperthyroidism 03/28/2016      REFERRING PROVIDER: Emelia Loron, MD  REFERRING DIAG: s/p Right Breast Cancer  THERAPY DIAG:  Aftercare following surgery for neoplasm  Muscle weakness (generalized)  Stiffness of right  shoulder, not elsewhere classified  Malignant neoplasm of overlapping sites of right breast in female, estrogen receptor positive [facility])  ONSET DATE: November 2023  Rationale for Evaluation and Treatment: Rehabilitation  SUBJECTIVE:                                                                                                                                                                                           SUBJECTIVE STATEMENT:  The knots I could feel are better and I have been doing the reaching without any problem. I haven't really had the sharp pain. If I don't stretch for a few days I  do still get tight. I feel ready to be released today. PERTINENT HISTORY:    R breast cancer, left outer quadrant, triple positive, Ki 67 - 60%, grade 2 -3 IDC, neoadjuvant chemo and immuno therapy,  right mastectomy on 11/28/2020 with 4 nodes removed on the right. She also had radiation ending 02/17/2021. and she had an excision of breast tissue on the left that was benign   PAIN:  Are you having pain? Not right now, tender in axillary and chest region on right. NPRS scale: 0/10 presently Pain location: Under mastectomy incision Pain orientation: Right  PAIN TYPE: sharp and tight Pain description: intermittent  Aggravating factors: reaching to night stand, when first starting to stretch its tight Relieving factors: not reaching  PRECAUTIONS: Right UE lymphedema risk  WEIGHT BEARING RESTRICTIONS: No  FALLS:  Has patient fallen in last 6 months? 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 A little restricted at lateral border, and more tender  SENSATION: Light touch: Deficits light numbness posterior right arm and axilla  POSTURE: forward head, rounded shoulders  UPPER EXTREMITY AROM/PROM:  A/PROM RIGHT   eval  RIGHT 10/23/22  Shoulder extension 60   Shoulder flexion 144 156  Shoulder abduction 163 174   Shoulder internal rotation 70   Shoulder external rotation 98     (Blank rows = not tested)  A/PROM  LEFT   eval  Shoulder extension 58  Shoulder flexion 153  Shoulder abduction 170  Shoulder internal rotation 70  Shoulder external rotation 96    (Blank rows = not tested)  CERVICAL AROM: All within normal limits:    UPPER EXTREMITY STRENGTH: WNL   LYMPHEDEMA ASSESSMENTS:   SURGERY TYPE/DATE: Right Mastectomy 11/28/2020  NUMBER OF LYMPH NODES REMOVED: 4  CHEMOTHERAPY: Yes  RADIATION:Yes  HORMONE TREATMENT: NO  INFECTIONS: NO  LYMPHEDEMA ASSESSMENTS:   LANDMARK RIGHT  eval  10 cm proximal to olecranon process 26.7  Olecranon process 23.6  10 cm proximal to ulnar styloid process 17.5  Just proximal to ulnar styloid process 14.4  Across hand at thumb web space 17.3  At base of 2nd digit 5.8  (Blank rows = not tested)  LANDMARK LEFT  eval  10 cm proximal to olecranon process 28.4  Olecranon process 24.3  10 cm proximal to ulnar styloid process 17.9  Just proximal to ulnar styloid process 14.6  Across hand at thumb web space 17.3  At base of 2nd digit 5.5  (Blank rows = not tested)      QUICK DASH SURVEY: 14%   TODAY'S TREATMENT:                                                                                                                                         DATE:  11/15/2022 Gave script for compression sleeve and discussed wear time Soft tissue mobilization to right UT, Pectorals, lateral trunk with cocoa butter Scar massage to mastectomy incision, MFR in different directions to right chest area PROM right shoulder flex, scaption, abd, IR and ER with MFR to axilla 3 D AROM on half foam roll x 5 flexion, scaption, horizontal abduction bilaterally  11/08/2022  Soft tissue mobilization to right UT, Pectorals, lateral trunk with cocoa butter Scar massage to mastectomy incision, MFR in different directions to right chest area Cupping to right  scapular area and right mastectomy incision above and below. PROM right shoulder flex, scaption, abd, IR and ER with MFR to axilla Snow angels x 10, LTR knees to left x2 ea arms extended and goal post  10/31/2022 Soft tissue mobilization to right UT, Pectorals, lateral trunk with cocoa butter Scar massage to mastectomy incision, MFR in different directions to right chest area Cupping to right scapular area and right mastectomy incision above and below. PROM right shoulder with MFR to axilla LTR with arms outstretched x 4 knees to left Supine scapular series with yellow band; flexion, horizontal abd, ER all x 10 except sword x 5 Pec doorwary stretch x 2   10/23/2022 Soft tissue mobilization to right UT, Pectorals, lateral trunk, and in SL to scapular area with cocoa butter. Scar massage to mastectomy incision, MFR in different directions to right chest area Cupping to right scapular area and right mastectomy incision above and below. PROM right shoulder  with MFR to axilla    10/18/2022 Soft tissue mobilization to right UT, Pectorals, lateral trunk, and in SL to scapular area with cocoa butter. Scar massage to mastectomy incision, MFR in different directions to right chest area Cupping to right scapular area and right mastectomy incision above and below. PROM right shoulder with MFR to axilla  10/16/2022 Soft tissue mobilization to right UT, Pectorals, lateral trunk with cocoa butter Scar massage to mastectomy incision, MFR in different directions to right chest area LTR x 4 ea side with arms outstretched and goal post arms Open book stretch x 5 PROM right shoulder flexion, scaption, abduction Reaching for cones to far right 2 x 4, and to far left reaching with Right UE to simulate reach to night stand 2 x 4. Able to do without increased pain in chest.    10/11/2022 Soft tissue mobilization to right UT, Pectorals, lateral trunk with cocoa butter Scar massage to mastectomy incision,  MFR in different directions to right chest area Supine wand flexion and scap x4 ea with 5 sec hold AROM bilateral UE's x 5 for flexion, scaption, abduction Open book stretch x 5 PROM right shoulder flexion, scaption, abduction Pectoralis doorway stretch single and double arm x 2 Pt to simulate reaching to night stand to see if she has improved.   10/09/2022 Soft tissue mobilization to right UT, Pectorals, lateral trunk with cocoa butter Scar massage to mastectomy incision, MFR in different directions to right chest area Supine wand flexion and scap x 3-4 ea with 5 sec hold LTR x 5 with arms outstretched and goal post arms PROM right shoulder flexion, scaption, abduction Updated HEP with supine wand flex and scaption  10/01/2022  PATIENT EDUCATION:    10/31/2022; supine scapular series with yellow x 10 except sword x 5 ea 10/10/2022: updated HEP with supine wand flexion and scaption Education details: pt to perform gentle stretches she is already doing but to add an additional session in the afternoon/evening. Educated in LTR with arms outstretched and and gave handout. Also educated in scar massage to mastectomy incision. Person educated: Patient Education method: Chief Technology Officer Education comprehension: verbalized understanding and returned demonstration  HOME EXERCISE PROGRAM: LTR with arms outstretched, stargazer, scar massage, supine wand flex and scap, supine scapular series  ASSESSMENT:  CLINICAL IMPRESSION: Pt has achieved all goals established at initial evaluation. She is compliant with her HEP and feels ready to be released at this time. She has made excellent improvement with ROM, and decreased overall pain/discomfort. She is discharged.   OBJECTIVE IMPAIRMENTS: decreased activity tolerance, decreased mobility, decreased ROM, impaired UE functional use, postural dysfunction, and pain.   ACTIVITY LIMITATIONS: reach over head and to the side  PARTICIPATION  LIMITATIONS:  Pt is doing all that is required but is cautious with reaching  PERSONAL FACTORS: 1-2 comorbidities: Right breast triple positive Breast cancer s/p right mastectomy with chemo and radiation  are also affecting patient's functional outcome.   REHAB POTENTIAL: Good  CLINICAL DECISION MAKING: Stable/uncomplicated  EVALUATION COMPLEXITY: Low  GOALS: Goals reviewed with patient? Yes  SHORT TERM GOALS=LONG TERM GOALS: Target date: 5/202/2024  Pt will be independent in a HEP for gentle stretching of  mastectomy scar and right upper quarter to relieve pain Baseline: Goal status: MET 10/18/2022 2.  Pt will note decreased tightness/pain by atleast 50% Baseline:  Goal status: MET 11/15/2022 3.  Pt will be able to reach to her nightstand without sharp pain Baseline:  Goal status: MET 11/16/2022  4.  Pts right shoulder flexion and abduction wiill be WNL Baseline: flex 144, abd 163 Goal status: MET 10/23/2022    PLAN:  PT FREQUENCY: 2x/week  PT DURATION: 6 weeks  PLANNED INTERVENTIONS: Therapeutic exercises, Therapeutic activity, Patient/Family education, Self Care, Joint mobilization, Orthotic/Fit training, Dry Needling, scar mobilization, Manual therapy, and Re-evaluation  PLAN FOR NEXT SESSION: recert/DC? Give red band,consider cupping around incision, scap. area,STM to right UQ to decrease tissue tension, supine wand scaption, scar massage, MFR to right chest area, PROM. (Give script for Sleeve) Progress to strength when ready. Pt will be travelling some (NO SOZO noted in chart due to plate in wrist) PHYSICAL THERAPY DISCHARGE SUMMARY  Visits from Start of Care: 13  Current functional level related to goals / functional outcomes: Achieved all goals established   Remaining deficits: none   Education / Equipment: HEP/theraband   Patient agrees to discharge. Patient goals were met. Patient is being discharged due to meeting the stated rehab goals.   Waynette Buttery, PT 11/15/2022, 11:52 AM

## 2022-11-21 ENCOUNTER — Ambulatory Visit: Payer: BC Managed Care – PPO | Admitting: Physical Therapy

## 2022-11-21 ENCOUNTER — Encounter: Payer: Self-pay | Admitting: Physical Therapy

## 2022-11-21 DIAGNOSIS — M25611 Stiffness of right shoulder, not elsewhere classified: Secondary | ICD-10-CM | POA: Diagnosis not present

## 2022-11-21 DIAGNOSIS — M62838 Other muscle spasm: Secondary | ICD-10-CM | POA: Diagnosis not present

## 2022-11-21 DIAGNOSIS — Z483 Aftercare following surgery for neoplasm: Secondary | ICD-10-CM

## 2022-11-21 DIAGNOSIS — M6281 Muscle weakness (generalized): Secondary | ICD-10-CM | POA: Diagnosis not present

## 2022-11-21 DIAGNOSIS — C50811 Malignant neoplasm of overlapping sites of right female breast: Secondary | ICD-10-CM | POA: Diagnosis not present

## 2022-11-21 DIAGNOSIS — Z170 Estrogen receptor positive status [ER+]: Secondary | ICD-10-CM | POA: Diagnosis not present

## 2022-11-21 NOTE — Therapy (Signed)
OUTPATIENT PHYSICAL THERAPY TREATMENT NOTE   Patient Name: Teresa Sheppard MRN: [id number] DOB:Oct 28, 1960, 62 y.o., female Today's Date: 11/21/2022  PCP: Georgann Housekeeper, MD  REFERRING PROVIDER: Selmer Dominion, NP   END OF SESSION:   PT End of Session - 11/21/22 1021     Visit Number 5    Date for PT Re-Evaluation 01/08/23    Authorization Type [employer]    Authorization - Visit Number 14    Authorization - Number of Visits 30    PT Start Time 1020    PT Stop Time 1058    PT Time Calculation (min) 38 min    Activity Tolerance Patient tolerated treatment well    Behavior During Therapy WFL for tasks assessed/performed             Past Medical History:  Diagnosis Date   Anxiety    Breast cancer in female [facility]    Right   Family history of breast cancer 11/07/2020   Family history of pancreatic cancer 11/07/2020   History of kidney stones 2014-2015   Hyperthyroidism    No longer an issue   PONV (postoperative nausea and vomiting)    Thyromegaly    Past Surgical History:  Procedure Laterality Date   ABDOMINAL WALL DEFECT REPAIR     following hernia repair in 2010, no mesh was used   ABDOMINOPLASTY     with hernia repair   APPENDECTOMY     BOWEL RESECTION     2008  while pregnant with last child. Scar tissue from appendectomy wrapped around bowel and was snipped to free bowel.    CESAREAN SECTION     DILATION AND CURETTAGE OF UTERUS     2000 and 2014   HERNIA REPAIR     MASTECTOMY W/ SENTINEL NODE BIOPSY Right 11/28/2020   Procedure: RIGHT MASTECTOMY WITH RIGHT AXILLARY SENTINEL LYMPH NODE BIOPSY;  Surgeon: Emelia Loron, MD;  Location: [facility] OR;  Service: General;  Laterality: Right;   PORT-A-CATH REMOVAL Left 01/30/2021   Procedure: PORT REMOVAL;  Surgeon: Emelia Loron, MD;  Location: [facility] OR;  Service: General;  Laterality: Left;  LOCAL   PORTACATH PLACEMENT Left 07/12/2020   Procedure: INSERTION PORT-A-CATH WITH ULTRASOUND GUIDANCE;  Surgeon: Emelia Loron, MD;  Location: [facility] OR;  Service: General;  Laterality: Left;   RADIOACTIVE SEED GUIDED AXILLARY SENTINEL LYMPH NODE Right 11/28/2020   Procedure: RADIOACTIVE SEED GUIDED RIGHT AXILLARY AXILLARY SENTINEL LYMPH NODE EXCISION;  Surgeon: Emelia Loron, MD;  Location: [facility] OR;  Service: General;  Laterality: Right;   RADIOACTIVE SEED GUIDED EXCISIONAL BREAST BIOPSY Left 11/28/2020   Procedure: RADIOACTIVE SEED GUIDED LEFT EXCISIONAL BREAST BIOPSY;  Surgeon: Emelia Loron, MD;  Location: [facility] OR;  Service: General;  Laterality: Left;   ROOT CANAL     2019   TONSILLECTOMY  1969   URETERAL EXPLORATION     dilataion for congenitally small ureter   Patient Active Problem List   Diagnosis Date Noted   Chemotherapy-induced peripheral neuropathy (HCC) 10/24/2021   Vitamin D deficiency 08/30/2021   Osteoporosis 08/30/2021   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   Genetic testing 11/15/2020   Family history of breast cancer 11/07/2020   Family history of pancreatic cancer 11/07/2020   Dysphagia 10/26/2020   Malignant neoplasm of overlapping sites of right breast in female, estrogen receptor positive (HCC) 06/21/2020   Hyperthyroidism 03/28/2016   REFERRING PROVIDER: Selmer Dominion, NP    REFERRING DIAG:  N32.81 (ICD-10-CM) - Overactive bladder  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 I am working on the urge to void during the night. Last 2 nights were better.       PAIN:  Are you having pain? Yes NPRS scale: 3/10 Pain location: External and opening, suprapubic   Pain type: aching and shooting  Pain description: intermittent    Aggravating factors: when urine coming out of the bladder  Relieving factors: drink more water, pelvic floor relaxation   PRECAUTIONS: Other: breast cancer   WEIGHT BEARING RESTRICTIONS: No   FALLS:  Has patient fallen in last 6 months? No   LIVING ENVIRONMENT: Lives with: lives with their family   OCCUPATION: sitting   PLOF: Independent   PATIENT GOALS: reduce the number of times going to the bathroom   PERTINENT HISTORY:  Dilation and curettage of uterus; Bowel resection; Ureteral exploration; Appendectomy; Cesarean section; Abdominal wall defect repair; Hernia repair; Portacath placement (Left, 07/12/2020); Mastectomy w/ sentinel node biopsy (Right, 11/28/2020); Radioactive seed guided axillary sentinel lymph node (Right, 11/28/2020); Radioactive seed guided excisional breast biopsy (Left, 11/28/2020); Port-a-cath removal (Left, 01/30/2021); and Abdominoplasty. Breast cancer     BOWEL MOVEMENT: no issues with bowel movements Pain with bowel movement: No   URINATION: Pain with urination: discomfort during urination Fully empty bladder: Yes: no because 15 minutes later may have to go again Stream: Weak Urgency: Yes: at night and not as much during the day Frequency: night time voids 6-8 times; during the day goes every 2-3 hours Leakage:  none Pads: No   INTERCOURSE: not sexually active due to the pain    PREGNANCY: Vaginal deliveries 3 Tearing Yes:   C-section deliveries 1 Currently pregnant No   PROLAPSE: None     OBJECTIVE:    DIAGNOSTIC FINDINGS:  none     COGNITION: Overall cognitive status: Within functional limits for tasks assessed  SENSATION: Light touch: Appears intact Proprioception: Appears intact     POSTURE: No Significant postural limitations    PELVIC ALIGNMENT:   LUMBARAROM/PROM:   A/PROM A/PROM  eval  Flexion full  Extension Decreased by 50%  Right lateral flexion Decreased by 25%  Left lateral flexion Decreased by 25%  Right rotation Decreased by 25%  Left rotation Decreased by 25%   (Blank rows = not tested)   LOWER EXTREMITY ROM:   Passive ROM Right eval Left eval  Hip external rotation 40 45   (Blank rows = not tested)   LOWER EXTREMITY MMT:   MMT Right eval Left eval  Hip flexion 4/5 4/5  Hip extension 3/5 3/5  Hip abduction 3/5 3/5  Hip adduction 4/5 4/5    PALPATION:   General  decreased movement of the lower rib cage and abdominal tightness                 External Perineal Exam firmness of the perineal tissue                             Internal Pelvic Floor tenderness in right obturator internist, along the sides of the urethra and bladder, iliococcygeus   Patient confirms identification and approves PT to assess internal pelvic floor and treatment Yes   PELVIC MMT:   MMT eval 10/30/22  Vaginal 3/5 3/5 with circular hug  (Blank rows = not tested)         TONE: increased   PROLAPSE: none   TODAY'S TREATMENT:    11/21/22 Manual: Soft tissue mobilization: To assess for dry needling Manual work to the right hip adductor to elongate after dry needling.  Internal pelvic floor techniques: No emotional/communication barriers or cognitive limitation. Patient is motivated to learn. Patient understands and agrees with treatment goals and plan. PT explains patient will be examined in standing, sitting, and lying down to see how their muscles and joints work. When they are ready, they will be asked to remove their underwear so PT can examine their perineum. The patient is also given the option of providing their own chaperone as one is not provided in our facility. The patient also has the right and is explained the right to defer or refuse any part of the evaluation or treatment including the  internal exam. With the patient's consent, PT will use one gloved finger to gently assess the muscles of the pelvic floor, seeing how well it contracts and relaxes and if there is muscle symmetry. After, the patient will get dressed and PT and patient will discuss exam findings and plan of care. PT and patient discuss plan of care, schedule, attendance policy and HEP activities.  Going through the vaginal canal working on the perineal body, introitus and release of the urethra Manual work externally to the right ischiocavernosus Trigger Point Dry-Needling  Treatment instructions: Expect mild to moderate muscle soreness. S/S of pneumothorax if dry needled over a lung field, and to seek immediate medical attention should they occur. Patient verbalized understanding of these instructions and education.  Patient Consent Given: Yes Education handout provided: Yes Muscles treated: right hip adductor, right ischiocavernosus, Electrical stimulation performed: No Parameters: N/A Treatment response/outcome: elongation of muscle and trigger point response    11/14/22 Manual: Soft tissue mobilization: To assess for dry needling Manual work to the left hip adductor to elongate after dry needling.  Internal pelvic floor techniques: No emotional/communication barriers or cognitive  limitation. Patient is motivated to learn. Patient understands and agrees with treatment goals and plan. PT explains patient will be examined in standing, sitting, and lying down to see how their muscles and joints work. When they are ready, they will be asked to remove their underwear so PT can examine their perineum. The patient is also given the option of providing their own chaperone as one is not provided in our facility. The patient also has the right and is explained the right to defer or refuse any part of the evaluation or treatment including the internal exam. With the patient's consent, PT will use one gloved finger to  gently assess the muscles of the pelvic floor, seeing how well it contracts and relaxes and if there is muscle symmetry. After, the patient will get dressed and PT and patient will discuss exam findings and plan of care. PT and patient discuss plan of care, schedule, attendance policy and HEP activities.  To assess for dry needling Going through the vaginal canal working on the perineal body and ischiocavernosus to elongate after dry needling Trigger Point Dry-Needling  Treatment instructions: Expect mild to moderate muscle soreness. S/S of pneumothorax if dry needled over a lung field, and to seek immediate medical attention should they occur. Patient verbalized understanding of these instructions and education.  Patient Consent Given: Yes Education handout provided: Yes Muscles treated: left hip adductor, left ischiocavernosus, perineal body Electrical stimulation performed: No Parameters: N/A Treatment response/outcome: elongation of muscle and trigger point response  Neuromuscular re-education: Pelvic floor contraction training: Therapist finger in the vaginal canal working on pelvic floor contraction Down training: Educated patient on ways to deter the urge to urinate at night and using different behavioral techniques.  Exercises: Strengthening: Recumbent bike for 5 minutes while assessing patient.   10/30/22 Manual: Internal pelvic floor techniques: No emotional/communication barriers or cognitive limitation. Patient is motivated to learn. Patient understands and agrees with treatment goals and plan. PT explains patient will be examined in standing, sitting, and lying down to see how their muscles and joints work. When they are ready, they will be asked to remove their underwear so PT can examine their perineum. The patient is also given the option of providing their own chaperone as one is not provided in our facility. The patient also has the right and is explained the right to defer  or refuse any part of the evaluation or treatment including the internal exam. With the patient's consent, PT will use one gloved finger to gently assess the muscles of the pelvic floor, seeing how well it contracts and relaxes and if there is muscle symmetry. After, the patient will get dressed and PT and patient will discuss exam findings and plan of care. PT and patient discuss plan of care, schedule, attendance policy and HEP activities.  Going through the vagina working on the sides of the introitus, superficial transverse perineum, iliococcygeus, along the obturator internist Fascial release along the sides of the bladder and urethra with one finger in the vaginal canal and the other on the lower abdomen Neuromuscular re-education: Pelvic floor contraction training: Pelvic floor contraction with therapist finger in the vaginal canal.  Down training: Diaphragmatic breathing with therapist finger in the vaginal canal working with pelvic drop  PATIENT EDUCATION: 10/23/22 Education details: Access Code: [email or web address] Person educated: Patient Education method: Explanation, Demonstration, Tactile cues, Verbal cues, and Handouts Education comprehension: verbalized understanding, returned demonstration, verbal cues required, tactile cues required, and needs further education     HOME EXERCISE PROGRAM: 10/23/22 Access Code: [email or web address] URL: [email or web address] Date: 10/23/2022 Prepared by: Eulis Foster   Program Notes vaginal wand 3 times per weeksit on foam roll daily moving side to side and practice your breathing on it.    Exercises - Supine Diaphragmatic Breathing  - 1 x daily - 7 x weekly - 1 sets - 10 reps - Seated Diaphragmatic Breathing  - 1 x daily - 7 x weekly - 1 sets - 10 reps   ASSESSMENT:   CLINICAL IMPRESSION: Patient is a 62 y.o. female who was seen today for physical therapy treatment for overactive bladder  and levator spasm.  She had less trigger points in the pelvic floor today. She is able to wait a little longer today to go to the bathroom. She is working on the urge to void at night. Some days she has less vaginal irritation. Patient will benefit from skilled therapy to reduce pelvic floor tone and trigger points so she is able to relax her pelvic floor and reduce irritation.    OBJECTIVE IMPAIRMENTS: decreased ROM, decreased strength, increased fascial restrictions, increased muscle spasms, and pain.    ACTIVITY LIMITATIONS: toileting   PARTICIPATION LIMITATIONS: community activity   PERSONAL FACTORS: Age and 3+ comorbidities: Dilation and curettage of uterus; Bowel resection; Ureteral exploration; Appendectomy; Cesarean section; Abdominal wall defect repair; Hernia repair; Portacath placement (Left, 07/12/2020); Mastectomy w/ sentinel node biopsy (Right, 11/28/2020); Radioactive seed guided axillary sentinel lymph node (Right, 11/28/2020); Radioactive seed guided excisional breast biopsy (Left, 11/28/2020); Port-a-cath removal (Left, 01/30/2021); and Abdominoplasty. Breast cancer  are also affecting patient's functional outcome.    REHAB POTENTIAL: Good   CLINICAL DECISION MAKING: Stable/uncomplicated   EVALUATION COMPLEXITY: Low     GOALS: Goals reviewed with patient? Yes   SHORT TERM GOALS: Target date: 11/07/22   Patient understands how to use vaginal moisturizers to improve the tissue health.  Baseline: Goal status: Met 10/23/22   2.  Patient reports her vaginal irritation decreased >/= 25%.  Baseline: comes and goes Goal status: ongoing 11/21/22   3.  Patient educated on vaginal wand to reduce there trigger points of the pelvic floor.  Baseline:  Goal status: Met 10/30/22   4.  Patient is able to perform pelvic floor drop with diaphragmatic breathing.  Baseline:  Goal status: INITIAL     LONG TERM GOALS: Target date: 01/07/23   Patient independent with advanced HEP for  pelvic floor relaxation and core strength.  Baseline:  Goal status: INITIAL   2.  Patient wakes up </= 2 times per night due to reduction of urgency.  Baseline:  Goal status: INITIAL   3.  Patient is able to fully empty her bladder and not urinate again in 15 minutes due to the ability to fully relax her bladder.  Baseline:  Goal status: INITIAL   4.  Patient understands how to manage irritation of the bladder with relaxation and manual exercises.  Baseline:  Goal status: INITIAL       PLAN:   PT FREQUENCY: 1x/week   PT DURATION: 12 weeks   PLANNED INTERVENTIONS: Therapeutic exercises, Therapeutic activity, Neuromuscular re-education, Patient/Family education, Joint mobilization, Dry Needling, Electrical stimulation, Cryotherapy, Moist heat, Ultrasound, Biofeedback, and Manual therapy   PLAN FOR NEXT SESSION:  dry needling to hip adductor and pelvic floor on right.  hip stretches to release the pelvic floor, manual work to the lower abdomen with hands and suction cup and pelvic floor; pelvic drop   Eulis Foster, PT 11/21/22 10:59 AM

## 2022-11-26 ENCOUNTER — Encounter: Payer: Self-pay | Admitting: Physical Therapy

## 2022-11-26 ENCOUNTER — Ambulatory Visit: Payer: BC Managed Care – PPO | Attending: Hematology and Oncology | Admitting: Physical Therapy

## 2022-11-26 DIAGNOSIS — M62838 Other muscle spasm: Secondary | ICD-10-CM | POA: Diagnosis not present

## 2022-11-26 DIAGNOSIS — Z483 Aftercare following surgery for neoplasm: Secondary | ICD-10-CM | POA: Diagnosis not present

## 2022-11-26 DIAGNOSIS — M6281 Muscle weakness (generalized): Secondary | ICD-10-CM | POA: Insufficient documentation

## 2022-11-26 NOTE — Therapy (Signed)
OUTPATIENT PHYSICAL THERAPY TREATMENT NOTE   Patient Name: Teresa Sheppard MRN: [id number] DOB:[birth year], 62 y.o., female Today's Date: 11/26/2022  PCP: Georgann Housekeeper, MD  REFERRING PROVIDER: Selmer Dominion, NP   END OF SESSION:   PT End of Session - 11/26/22 1015     Visit Number 6   pelvic   Date for PT Re-Evaluation 01/08/23   pelvic   Authorization Type [employer]    Authorization - Visit Number 15    Authorization - Number of Visits 30    PT Start Time 1015    PT Stop Time 1055    PT Time Calculation (min) 40 min    Activity Tolerance Patient tolerated treatment well    Behavior During Therapy WFL for tasks assessed/performed             Past Medical History:  Diagnosis Date   Anxiety    Breast cancer in female [facility])    Right   Family history of breast cancer 11/07/2020   Family history of pancreatic cancer 11/07/2020   History of kidney stones 2014-2015   Hyperthyroidism    No longer an issue   PONV (postoperative nausea and vomiting)    Thyromegaly    Past Surgical History:  Procedure Laterality Date   ABDOMINAL WALL DEFECT REPAIR     following hernia repair in 2010, no mesh was used   ABDOMINOPLASTY     with hernia repair   APPENDECTOMY     BOWEL RESECTION     2008  while pregnant with last child. Scar tissue from appendectomy wrapped around bowel and was snipped to free bowel.    CESAREAN SECTION     DILATION AND CURETTAGE OF UTERUS     2000 and 2014   HERNIA REPAIR     MASTECTOMY W/ SENTINEL NODE BIOPSY Right 11/28/2020   Procedure: RIGHT MASTECTOMY WITH RIGHT AXILLARY SENTINEL LYMPH NODE BIOPSY;  Surgeon: Emelia Loron, MD;  Location: [facility] OR;  Service: General;  Laterality: Right;   PORT-A-CATH REMOVAL Left 01/30/2021   Procedure: PORT REMOVAL;  Surgeon: Emelia Loron, MD;  Location: [facility] OR;  Service: General;  Laterality: Left;  LOCAL   PORTACATH PLACEMENT Left 07/12/2020   Procedure: INSERTION PORT-A-CATH WITH ULTRASOUND GUIDANCE;   Surgeon: Emelia Loron, MD;  Location: [facility] OR;  Service: General;  Laterality: Left;   RADIOACTIVE SEED GUIDED AXILLARY SENTINEL LYMPH NODE Right 11/28/2020   Procedure: RADIOACTIVE SEED GUIDED RIGHT AXILLARY AXILLARY SENTINEL LYMPH NODE EXCISION;  Surgeon: Emelia Loron, MD;  Location: [facility] OR;  Service: General;  Laterality: Right;   RADIOACTIVE SEED GUIDED EXCISIONAL BREAST BIOPSY Left 11/28/2020   Procedure: RADIOACTIVE SEED GUIDED LEFT EXCISIONAL BREAST BIOPSY;  Surgeon: Emelia Loron, MD;  Location: [facility] OR;  Service: General;  Laterality: Left;   ROOT CANAL     2019   TONSILLECTOMY  1969   URETERAL EXPLORATION     dilataion for congenitally small ureter   Patient Active Problem List   Diagnosis Date Noted   Chemotherapy-induced peripheral neuropathy (HCC) 10/24/2021   Vitamin D deficiency 08/30/2021   Osteoporosis 08/30/2021   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   Genetic testing 11/15/2020   Family history of breast cancer 11/07/2020   Family history of pancreatic cancer 11/07/2020   Dysphagia 10/26/2020   Malignant neoplasm of overlapping sites of right breast in female, estrogen receptor positive (HCC) 06/21/2020   Hyperthyroidism 03/28/2016   REFERRING PROVIDER: Selmer Dominion, NP    REFERRING DIAG:  N32.81 (ICD-10-CM) - Overactive  bladder  M62.838 (ICD-10-CM) - Levator spasm      THERAPY DIAG:              Other muscle spasm Z61,096             Muscle weakness ( generalized) M62.81             Aftercare following surgery for neoplasm Z48.3   Rationale for Evaluation and Treatment: Rehabilitation   ONSET DATE: 2023   SUBJECTIVE:                                                                                                                                                                                            SUBJECTIVE STATEMENT: I am feeling better. I am feeling less sore. I am still having a lot of nighttime urination.      PAIN:  Are  you having pain? Yes NPRS scale: 3/10 Pain location: External and opening, suprapubic   Pain type: aching and shooting  Pain description: intermittent    Aggravating factors: when urine coming out of the bladder  Relieving factors: drink more water, pelvic floor relaxation   PRECAUTIONS: Other: breast cancer   WEIGHT BEARING RESTRICTIONS: No   FALLS:  Has patient fallen in last 6 months? No   LIVING ENVIRONMENT: Lives with: lives with their family   OCCUPATION: sitting   PLOF: Independent   PATIENT GOALS: reduce the number of times going to the bathroom   PERTINENT HISTORY:  Dilation and curettage of uterus; Bowel resection; Ureteral exploration; Appendectomy; Cesarean section; Abdominal wall defect repair; Hernia repair; Portacath placement (Left, 07/12/2020); Mastectomy w/ sentinel node biopsy (Right, 11/28/2020); Radioactive seed guided axillary sentinel lymph node (Right, 11/28/2020); Radioactive seed guided excisional breast biopsy (Left, 11/28/2020); Port-a-cath removal (Left, 01/30/2021); and Abdominoplasty. Breast cancer     BOWEL MOVEMENT: no issues with bowel movements Pain with bowel movement: No   URINATION: Pain with urination: discomfort during urination Fully empty bladder: Yes: no because 15 minutes later may have to go again Stream: Weak Urgency: Yes: at night and not as much during the day Frequency: night time voids 6-8 times; during the day goes every 2-3 hours Leakage:  none Pads: No   INTERCOURSE: not sexually active due to the pain    PREGNANCY: Vaginal deliveries 3 Tearing Yes:   C-section deliveries 1 Currently pregnant No   PROLAPSE: None     OBJECTIVE:    DIAGNOSTIC FINDINGS:  none     COGNITION: Overall cognitive status: Within functional limits for tasks assessed  SENSATION: Light touch: Appears intact Proprioception: Appears intact     POSTURE: No Significant postural limitations   PELVIC  ALIGNMENT:   LUMBARAROM/PROM:   A/PROM A/PROM  eval  Flexion full  Extension Decreased by 50%  Right lateral flexion Decreased by 25%  Left lateral flexion Decreased by 25%  Right rotation Decreased by 25%  Left rotation Decreased by 25%   (Blank rows = not tested)   LOWER EXTREMITY ROM:   Passive ROM Right eval Left eval Right  11/26/22 Left 11/26/22  Hip external rotation 40 45 50 65   (Blank rows = not tested)   LOWER EXTREMITY MMT:   MMT Right eval Left eval  Hip flexion 4/5 4/5  Hip extension 3/5 3/5  Hip abduction 3/5 3/5  Hip adduction 4/5 4/5    PALPATION:   General  decreased movement of the lower rib cage and abdominal tightness                 External Perineal Exam firmness of the perineal tissue                             Internal Pelvic Floor tenderness in right obturator internist, along the sides of the urethra and bladder, iliococcygeus   Patient confirms identification and approves PT to assess internal pelvic floor and treatment Yes   PELVIC MMT:   MMT eval 10/30/22  Vaginal 3/5 3/5 with circular hug  (Blank rows = not tested)         TONE: increased   PROLAPSE: none   TODAY'S TREATMENT:    11/26/22 Manual: mobilization: Right hip mobilization for posterior and lateral glide Exercises: Stretches/mobility: Educated patient on placing the estrogen cream into the vaginal canal and massage along the urethra Discussed with patient on the side effects of her cancer medication and it states bladder pain and urge to urinate are side effects.  Strengthening: Contract relax to right hip to improve right hip ER Standing hip abduction 40# on multihip machine 2 x 10 Therapeutic activities: Functional strengthening activities: Educated patient on filling out a bladder diary to see how many times she is awake to urinate and how much urine she is urinating.   11/21/22 Manual: Soft tissue mobilization: To assess for dry needling Manual work to the  right hip adductor to elongate after dry needling.  Internal pelvic floor techniques: No emotional/communication barriers or cognitive limitation. Patient is motivated to learn. Patient understands and agrees with treatment goals and plan. PT explains patient will be examined in standing, sitting, and lying down to see how their muscles and joints work. When they are ready, they will be asked to remove their underwear so PT can examine their perineum. The patient is also given the option of providing their own chaperone as one is not provided in our facility. The patient also has the right and is explained the right to defer or refuse any part of the evaluation or treatment including the internal exam. With the patient's consent, PT will use one gloved finger to gently assess the muscles of the pelvic floor, seeing how well it contracts and relaxes and if there is muscle symmetry. After, the patient will get dressed and PT and patient will discuss exam findings and plan of care. PT and patient discuss plan of care, schedule, attendance policy and HEP activities.  Going through the vaginal canal working on the perineal body, introitus and release of the  urethra Manual work externally to the right Corporate treasurer Dry-Needling  Treatment instructions: Expect mild to moderate muscle soreness. S/S of pneumothorax if dry needled over a lung field, and to seek immediate medical attention should they occur. Patient verbalized understanding of these instructions and education.  Patient Consent Given: Yes Education handout provided: Yes Muscles treated: right hip adductor, right ischiocavernosus, Electrical stimulation performed: No Parameters: N/A Treatment response/outcome: elongation of muscle and trigger point response    11/14/22 Manual: Soft tissue mobilization: To assess for dry needling Manual work to the left hip adductor to elongate after dry needling.  Internal pelvic floor  techniques: No emotional/communication barriers or cognitive limitation. Patient is motivated to learn. Patient understands and agrees with treatment goals and plan. PT explains patient will be examined in standing, sitting, and lying down to see how their muscles and joints work. When they are ready, they will be asked to remove their underwear so PT can examine their perineum. The patient is also given the option of providing their own chaperone as one is not provided in our facility. The patient also has the right and is explained the right to defer or refuse any part of the evaluation or treatment including the internal exam. With the patient's consent, PT will use one gloved finger to gently assess the muscles of the pelvic floor, seeing how well it contracts and relaxes and if there is muscle symmetry. After, the patient will get dressed and PT and patient will discuss exam findings and plan of care. PT and patient discuss plan of care, schedule, attendance policy and HEP activities.  To assess for dry needling Going through the vaginal canal working on the perineal body and ischiocavernosus to elongate after dry needling Trigger Point Dry-Needling  Treatment instructions: Expect mild to moderate muscle soreness. S/S of pneumothorax if dry needled over a lung field, and to seek immediate medical attention should they occur. Patient verbalized understanding of these instructions and education.  Patient Consent Given: Yes Education handout provided: Yes Muscles treated: left hip adductor, left ischiocavernosus, perineal body Electrical stimulation performed: No Parameters: N/A Treatment response/outcome: elongation of muscle and trigger point response  Neuromuscular re-education: Pelvic floor contraction training: Therapist finger in the vaginal canal working on pelvic floor contraction Down training: Educated patient on ways to deter the urge to urinate at night and using different behavioral  techniques.  Exercises: Strengthening: Recumbent bike for 5 minutes while assessing patient.     PATIENT EDUCATION: 10/23/22 Education details: Access Code: [email or web address] Person educated: Patient Education method: Explanation, Demonstration, Tactile cues, Verbal cues, and Handouts Education comprehension: verbalized understanding, returned demonstration, verbal cues required, tactile cues required, and needs further education     HOME EXERCISE PROGRAM: 10/23/22 Access Code: [email or web address] URL: [email or web address] Date: 10/23/2022 Prepared by: Eulis Foster   Program Notes vaginal wand 3 times per weeksit on foam roll daily moving side to side and practice your breathing on it.    Exercises - Supine Diaphragmatic Breathing  - 1 x daily - 7 x weekly - 1 sets - 10 reps - Seated Diaphragmatic Breathing  - 1 x daily - 7 x weekly - 1 sets - 10 reps   ASSESSMENT:   CLINICAL IMPRESSION: Patient is a 62 y.o. female who was seen today for physical therapy treatment for overactive bladder and levator spasm. Patient has increased in bilateral hip external rotation. Patient has tightness in the posterior and lateral right hip.  She is working on the constant urge at  night to urinate. Looked at side effects of her chemotherapy drug and bladder pain and urge to urinate are 2 side effects. Patient is not comfortable with using the vaginal wand so she will not use that for HEP at this time. Patient will be filling out a bladder diary to get a better understanding about her night time urination. Patient will benefit from skilled therapy to reduce pelvic floor tone and trigger points so she is able to relax her pelvic floor and reduce irritation.    OBJECTIVE IMPAIRMENTS: decreased ROM, decreased strength, increased fascial restrictions, increased muscle spasms, and pain.    ACTIVITY LIMITATIONS: toileting   PARTICIPATION LIMITATIONS: community activity   PERSONAL FACTORS: Age and 3+  comorbidities: Dilation and curettage of uterus; Bowel resection; Ureteral exploration; Appendectomy; Cesarean section; Abdominal wall defect repair; Hernia repair; Portacath placement (Left, 07/12/2020); Mastectomy w/ sentinel node biopsy (Right, 11/28/2020); Radioactive seed guided axillary sentinel lymph node (Right, 11/28/2020); Radioactive seed guided excisional breast biopsy (Left, 11/28/2020); Port-a-cath removal (Left, 01/30/2021); and Abdominoplasty. Breast cancer  are also affecting patient's functional outcome.    REHAB POTENTIAL: Good   CLINICAL DECISION MAKING: Stable/uncomplicated   EVALUATION COMPLEXITY: Low     GOALS: Goals reviewed with patient? Yes   SHORT TERM GOALS: Target date: 11/07/22   Patient understands how to use vaginal moisturizers to improve the tissue health.  Baseline: Goal status: Met 10/23/22   2.  Patient reports her vaginal irritation decreased >/= 25%.  Baseline: comes and goes Goal status: ongoing 11/21/22   3.  Patient educated on vaginal wand to reduce there trigger points of the pelvic floor.  Baseline:  Goal status: Met 10/30/22   4.  Patient is able to perform pelvic floor drop with diaphragmatic breathing.  Baseline:  Goal status: INITIAL     LONG TERM GOALS: Target date: 01/07/23   Patient independent with advanced HEP for pelvic floor relaxation and core strength.  Baseline:  Goal status: INITIAL   2.  Patient wakes up </= 2 times per night due to reduction of urgency.  Baseline:  Goal status: INITIAL   3.  Patient is able to fully empty her bladder and not urinate again in 15 minutes due to the ability to fully relax her bladder.  Baseline:  Goal status: INITIAL   4.  Patient understands how to manage irritation of the bladder with relaxation and manual exercises.  Baseline:  Goal status: INITIAL       PLAN:   PT FREQUENCY: 1x/week   PT DURATION: 12 weeks   PLANNED INTERVENTIONS: Therapeutic exercises, Therapeutic  activity, Neuromuscular re-education, Patient/Family education, Joint mobilization, Dry Needling, Electrical stimulation, Cryotherapy, Moist heat, Ultrasound, Biofeedback, and Manual therapy   PLAN FOR NEXT SESSION:  ask about the dry needling to hip adductor and pelvic floor did and if helped then do on the right.  hip stretches to release the pelvic floor, manual work to the lower abdomen with hands and suction cup and pelvic floor; pelvic drop, see how her appointment with Yvonna Alanis goes.    Eulis Foster, PT 11/26/22 2:23 PM

## 2022-11-27 ENCOUNTER — Encounter: Payer: Self-pay | Admitting: Obstetrics and Gynecology

## 2022-11-27 ENCOUNTER — Ambulatory Visit: Payer: BC Managed Care – PPO | Admitting: Obstetrics and Gynecology

## 2022-11-27 VITALS — BP 108/70 | HR 46

## 2022-11-27 DIAGNOSIS — M62838 Other muscle spasm: Secondary | ICD-10-CM | POA: Diagnosis not present

## 2022-11-27 DIAGNOSIS — R351 Nocturia: Secondary | ICD-10-CM

## 2022-11-27 DIAGNOSIS — N952 Postmenopausal atrophic vaginitis: Secondary | ICD-10-CM

## 2022-11-27 DIAGNOSIS — N3281 Overactive bladder: Secondary | ICD-10-CM

## 2022-11-27 NOTE — Patient Instructions (Addendum)
You can do vitamin E cream, olive oil, and lubrication with hyaluronic acid.   We can increase to 50mg  in a few weeks if you would like. You can double the 25mg  if you would like to go up on that.   GABA can be a good option for sleep.   You can do 2-3 low dose tums before drinking/eating bladder irritants to help decrease the irritation.

## 2022-11-27 NOTE — Progress Notes (Signed)
Teresa Sheppard Return Visit  SUBJECTIVE  History of Present Illness: Teresa Sheppard is a 62 y.o. female seen in follow-up for OAB. Plan at last visit was start Myrbetriq 25mg .   Feels like the Myrbetriq is helping about 20%.   She reports feeling like she can hold more urine and her stream is better.  Has also tried CBD gummy to assist in her sleep but she is not sure how much this was helping.    Past Medical History: Patient  has a past medical history of Anxiety, Breast cancer in female [facility]), Family history of breast cancer (11/07/2020), Family history of pancreatic cancer (11/07/2020), History of kidney stones (2014-2015), Hyperthyroidism, PONV (postoperative nausea and vomiting), and Thyromegaly.   Past Surgical History: She  has a past surgical history that includes Dilation and curettage of uterus; Bowel resection; Ureteral exploration; Appendectomy; Cesarean section; Abdominal wall defect repair; Root canal; Hernia repair; Portacath placement (Left, 07/12/2020); Tonsillectomy (1969); Mastectomy w/ sentinel node biopsy (Right, 11/28/2020); Radioactive seed guided axillary sentinel lymph node (Right, 11/28/2020); Radioactive seed guided excisional breast biopsy (Left, 11/28/2020); Port-a-cath removal (Left, 01/30/2021); and Abdominoplasty.   Medications: She has a current medication list which includes the following prescription(s): acetaminophen, biotin w/ vitamins c & e, cvs calcium soft chews, cholecalciferol, d-mannose, estradiol, lactobacillus-inulin, letrozole, vaginal moisturizer, mirabegron er, multiple vitamins-minerals, neratinib maleate, polyethyl glycol-propyl glycol, and vitamin e.   Allergies: Patient is allergic to shellfish-derived products, ciprofibrate, propylthiouracil, shellfish allergy, shrimp extract, hydrocodone, and penicillins.   Social History: Patient  reports that she has never smoked. She has never used smokeless tobacco. She reports current  alcohol use. She reports that she does not use drugs.      OBJECTIVE     Physical Exam: Vitals:   11/27/22 1310  BP: 108/70  Pulse: (!) 46   Gen: No apparent distress, A&O x 3.  Detailed Urogynecologic Evaluation:  Deferred.   ASSESSMENT AND PLAN    Teresa Sheppard is a 62 y.o. with:  1. Overactive bladder   2. Nocturia   3. Levator spasm   4. Vaginal atrophy    Patient reports mild improvement with the Myrbetriq 25mg . We discussed going up on the dose but she would like to give the 25mg  a little more time. She finishes her chemo July 10th and is concerned about having too much medication on board that may not be necessary if the irritation calms down after the chemo is done.  Patient is still getting up 4-6 times a night. Is working with PT to decrease symptoms and work on bladder retraining. Plans to fill out a bladder diary. We also discussed finding ways to help her sleep through the night. We discussed using OTC supplements such as the CBD she is using, GABA, Melatonin, or sleepy time teas. Working with PT on levator spasm.  Patient reports she does not love using estrogen cream. We discussed using vitamin e cream, olive oil, or lubrication with hyaluronic acid.   Patient to follow up in 6 weeks or sooner if needed.   Plan to follow up in 6 weeks so we can shared decision regarding

## 2022-11-28 ENCOUNTER — Other Ambulatory Visit: Payer: Self-pay

## 2022-12-05 ENCOUNTER — Ambulatory Visit: Payer: BC Managed Care – PPO | Admitting: Physical Therapy

## 2022-12-05 ENCOUNTER — Other Ambulatory Visit (HOSPITAL_COMMUNITY): Payer: Self-pay

## 2022-12-06 ENCOUNTER — Encounter: Payer: BC Managed Care – PPO | Admitting: Physical Therapy

## 2022-12-07 ENCOUNTER — Encounter (HOSPITAL_COMMUNITY): Payer: Self-pay

## 2022-12-07 ENCOUNTER — Other Ambulatory Visit (HOSPITAL_COMMUNITY): Payer: Self-pay

## 2022-12-10 ENCOUNTER — Encounter: Payer: Self-pay | Admitting: Hematology and Oncology

## 2022-12-10 ENCOUNTER — Other Ambulatory Visit: Payer: Self-pay | Admitting: *Deleted

## 2022-12-10 ENCOUNTER — Other Ambulatory Visit (HOSPITAL_COMMUNITY): Payer: Self-pay

## 2022-12-10 DIAGNOSIS — Z170 Estrogen receptor positive status [ER+]: Secondary | ICD-10-CM

## 2022-12-13 ENCOUNTER — Encounter: Payer: Self-pay | Admitting: Physical Therapy

## 2022-12-13 ENCOUNTER — Encounter: Payer: BC Managed Care – PPO | Attending: Obstetrics and Gynecology | Admitting: Physical Therapy

## 2022-12-13 DIAGNOSIS — M62838 Other muscle spasm: Secondary | ICD-10-CM | POA: Insufficient documentation

## 2022-12-13 DIAGNOSIS — M6281 Muscle weakness (generalized): Secondary | ICD-10-CM | POA: Insufficient documentation

## 2022-12-13 DIAGNOSIS — Z483 Aftercare following surgery for neoplasm: Secondary | ICD-10-CM | POA: Diagnosis not present

## 2022-12-13 NOTE — Patient Instructions (Signed)
Urge Incontinence  Ideal urination frequency is every 2-4 wakeful hours, which equates to 5-8 times within a 24-hour period.   Urge incontinence is leakage that occurs when the bladder muscle contracts, creating a sudden need to go before getting to the bathroom.   Going too often when your bladder isn't actually full can disrupt the body's automatic signals to store and hold urine longer, which will increase urgency/frequency.  In this case, the bladder "is running the show" and strategies can be learned to retrain this pattern.   One should be able to control the first urge to urinate, at around .  The bladder can hold up to a "grande latte," or . To help you gain control, practice the Urge Drill below when urgency strikes.  This drill will help retrain your bladder signals and allow you to store and hold urine longer.  The overall goal is to stretch out your time between voids to reach a more manageable voiding schedule.    Practice your "quick flicks" often throughout the day (each waking hour) even when you don't need feel the urge to go.  This will help strengthen your pelvic floor muscles, making them more effective in controlling leakage.  Urge Drill  When you feel an urge to go, follow these steps to regain control: Stop what you are doing and be still Take one deep breath, directing your air into your abdomen Think an affirming thought, such as "I've got this." Do 5 quick flicks of your pelvic floor Then heel raises 5 times  Do not walk to the bathroom until it has bee 2 hours since the last time you urinated Walk with control to the bathroom to void, or delay voiding  Teresa Sheppard, PT [facility] [address] [place], [place] [address] W: [phone] [email or web address]

## 2022-12-13 NOTE — Therapy (Signed)
OUTPATIENT PHYSICAL THERAPY TREATMENT NOTE   Patient Name: Teresa Sheppard MRN: [id number] DOB:05-29-1961, 62 y.o., female Today's Date: 12/13/2022  PCP: Georgann Housekeeper, MD  REFERRING PROVIDER: Selmer Dominion, NP   END OF SESSION:   PT End of Session - 12/13/22 1405     Visit Number 7    Date for PT Re-Evaluation 01/08/23    Authorization Type BCBS    Authorization - Visit Number 16    Authorization - Number of Visits 30    PT Start Time 1400    PT Stop Time 1445    PT Time Calculation (min) 45 min    Activity Tolerance Patient tolerated treatment well    Behavior During Therapy WFL for tasks assessed/performed             Past Medical History:  Diagnosis Date   Anxiety    Breast cancer in female [employer])    Right   Family history of breast cancer 11/07/2020   Family history of pancreatic cancer 11/07/2020   History of kidney stones 2014-2015   Hyperthyroidism    No longer an issue   PONV (postoperative nausea and vomiting)    Thyromegaly    Past Surgical History:  Procedure Laterality Date   ABDOMINAL WALL DEFECT REPAIR     following hernia repair in 2010, no mesh was used   ABDOMINOPLASTY     with hernia repair   APPENDECTOMY     BOWEL RESECTION     2008  while pregnant with last child. Scar tissue from appendectomy wrapped around bowel and was snipped to free bowel.    CESAREAN SECTION     DILATION AND CURETTAGE OF UTERUS     2000 and 2014   HERNIA REPAIR     MASTECTOMY W/ SENTINEL NODE BIOPSY Right 11/28/2020   Procedure: RIGHT MASTECTOMY WITH RIGHT AXILLARY SENTINEL LYMPH NODE BIOPSY;  Surgeon: Emelia Loron, MD;  Location: [facility] OR;  Service: General;  Laterality: Right;   PORT-A-CATH REMOVAL Left 01/30/2021   Procedure: PORT REMOVAL;  Surgeon: Emelia Loron, MD;  Location: [facility] OR;  Service: General;  Laterality: Left;  LOCAL   PORTACATH PLACEMENT Left 07/12/2020   Procedure: INSERTION PORT-A-CATH WITH ULTRASOUND GUIDANCE;  Surgeon: Emelia Loron, MD;  Location: [facility] OR;  Service: General;  Laterality: Left;   RADIOACTIVE SEED GUIDED AXILLARY SENTINEL LYMPH NODE Right 11/28/2020   Procedure: RADIOACTIVE SEED GUIDED RIGHT AXILLARY AXILLARY SENTINEL LYMPH NODE EXCISION;  Surgeon: Emelia Loron, MD;  Location: [facility] OR;  Service: General;  Laterality: Right;   RADIOACTIVE SEED GUIDED EXCISIONAL BREAST BIOPSY Left 11/28/2020   Procedure: RADIOACTIVE SEED GUIDED LEFT EXCISIONAL BREAST BIOPSY;  Surgeon: Emelia Loron, MD;  Location: [facility] OR;  Service: General;  Laterality: Left;   ROOT CANAL     2019   TONSILLECTOMY  1969   URETERAL EXPLORATION     dilataion for congenitally small ureter   Patient Active Problem List   Diagnosis Date Noted   Chemotherapy-induced peripheral neuropathy (HCC) 10/24/2021   Vitamin D deficiency 08/30/2021   Osteoporosis 08/30/2021   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   Genetic testing 11/15/2020   Family history of breast cancer 11/07/2020   Family history of pancreatic cancer 11/07/2020   Dysphagia 10/26/2020   Malignant neoplasm of overlapping sites of right breast in female, estrogen receptor positive (HCC) 06/21/2020   Hyperthyroidism 03/28/2016   REFERRING PROVIDER: Selmer Dominion, NP    REFERRING DIAG:  N32.81 (ICD-10-CM) - Overactive bladder  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 I would like to know some stretches for the right hip. Patient is still going to the bathroom often.     PAIN:  Are you having pain? Yes NPRS scale: 3/10 Pain location: External and opening, suprapubic   Pain type: aching and shooting  Pain description: intermittent    Aggravating factors: when urine coming out of the bladder  Relieving factors: drink more water, pelvic floor relaxation   PRECAUTIONS: Other: breast cancer   WEIGHT BEARING RESTRICTIONS: No   FALLS:  Has patient fallen in last 6 months? No   LIVING ENVIRONMENT: Lives with: lives with their family   OCCUPATION: sitting   PLOF: Independent   PATIENT GOALS: reduce the number of times going to the bathroom   PERTINENT HISTORY:  Dilation and curettage of uterus; Bowel resection; Ureteral exploration; Appendectomy; Cesarean section; Abdominal wall defect repair; Hernia repair; Portacath placement (Left, 07/12/2020); Mastectomy w/ sentinel node biopsy (Right, 11/28/2020); Radioactive seed guided axillary sentinel lymph node (Right, 11/28/2020); Radioactive seed guided excisional breast biopsy (Left, 11/28/2020); Port-a-cath removal (Left, 01/30/2021); and Abdominoplasty. Breast cancer     BOWEL MOVEMENT: no issues with bowel movements Pain with bowel movement: No   URINATION: Pain with urination: discomfort during urination Fully empty bladder: Yes: no because 15 minutes later may have to go again Stream: Weak Urgency: Yes: at night and not as much during the day Frequency: night time voids 6-8 times; during the day goes every 2-3 hours Leakage:  none Pads: No   INTERCOURSE: not sexually active due to the pain    PREGNANCY: Vaginal deliveries 3 Tearing Yes:   C-section deliveries 1 Currently pregnant No   PROLAPSE: None     OBJECTIVE:    DIAGNOSTIC FINDINGS:  none     COGNITION: Overall cognitive status: Within functional limits for tasks assessed  SENSATION: Light touch: Appears intact Proprioception: Appears intact     POSTURE: No Significant postural limitations    PELVIC ALIGNMENT:   LUMBARAROM/PROM:   A/PROM A/PROM  eval  Flexion full  Extension Decreased by 50%  Right lateral flexion Decreased by 25%  Left lateral flexion Decreased by 25%  Right rotation Decreased by 25%  Left rotation Decreased by 25%   (Blank rows = not tested)   LOWER EXTREMITY ROM:   Passive ROM Right eval Left eval Right  11/26/22 Left 11/26/22  Hip external rotation 40 45 50 65   (Blank rows = not tested)   LOWER EXTREMITY MMT:   MMT Right eval Left eval  Hip flexion 4/5 4/5  Hip extension 3/5 3/5  Hip abduction 3/5 3/5  Hip adduction 4/5 4/5    PALPATION:   General  decreased movement of the lower rib cage and abdominal tightness                 External Perineal Exam firmness of the perineal tissue                             Internal Pelvic Floor tenderness in right obturator internist, along the sides of the urethra and bladder, iliococcygeus   Patient confirms identification and approves PT to assess internal pelvic floor and treatment Yes   PELVIC MMT:   MMT eval 10/30/22  Vaginal 3/5 3/5 with circular hug  (Blank rows = not tested)         TONE: increased   PROLAPSE: none   TODAY'S TREATMENT:    12/13/22 Neuromuscular re-education: Down training: Educated patient the urge to urinate drill for during the day and to drink 64 ounces of water per day. Discussed with patient on holding her urine for 2 hours.  Exercises: Stretches/mobility: Sitting piriformis stretch holding 30 sec bil.  Happy baby in supine and sitting Squats to stretch the hips and pelvic floor Hip flexor stretch both sides but did not feel a great stretch  11/26/22 Manual: mobilization: Right hip mobilization for posterior and lateral glide Exercises: Stretches/mobility: Educated patient on placing the estrogen cream into the vaginal canal and massage along the urethra Discussed with patient on the side effects of her cancer medication and it states bladder pain  and urge to urinate are side effects.  Strengthening: Contract relax to right hip to improve right hip ER Standing hip abduction 40# on multihip machine 2 x 10 Therapeutic activities: Functional strengthening activities: Educated patient on filling out a bladder diary to see how many times she is awake to urinate and how much urine she is urinating.   11/21/22 Manual: Soft tissue mobilization: To assess for dry needling Manual work to the right hip adductor to elongate after dry needling.  Internal pelvic floor techniques: No emotional/communication barriers or cognitive limitation. Patient is motivated to learn. Patient understands and agrees with treatment goals and plan. PT explains patient will be examined in standing, sitting, and lying down to see how their muscles and joints work. When they are ready, they will be asked to remove their underwear so PT can examine their perineum. The patient is also given the option of providing their own chaperone as one is not provided in our facility. The patient also has the right and is explained the right to defer or refuse any part of the evaluation or treatment including the internal exam. With the patient's consent, PT will  use one gloved finger to gently assess the muscles of the pelvic floor, seeing how well it contracts and relaxes and if there is muscle symmetry. After, the patient will get dressed and PT and patient will discuss exam findings and plan of care. PT and patient discuss plan of care, schedule, attendance policy and HEP activities.  Going through the vaginal canal working on the perineal body, introitus and release of the urethra Manual work externally to the right ischiocavernosus Trigger Point Dry-Needling  Treatment instructions: Expect mild to moderate muscle soreness. S/S of pneumothorax if dry needled over a lung field, and to seek immediate medical attention should they occur. Patient verbalized understanding of these  instructions and education.  Patient Consent Given: Yes Education handout provided: Yes Muscles treated: right hip adductor, right ischiocavernosus, Electrical stimulation performed: No Parameters: N/A Treatment response/outcome: elongation of muscle and trigger point response      PATIENT EDUCATION: 10/23/22 Education details: Access Code: [email or web address] Person educated: Patient Education method: Explanation, Demonstration, Tactile cues, Verbal cues, and Handouts Education comprehension: verbalized understanding, returned demonstration, verbal cues required, tactile cues required, and needs further education     HOME EXERCISE PROGRAM: 10/23/22 Access Code: [email or web address] URL: [email or web address] Date: 10/23/2022 Prepared by: Eulis Foster   Program Notes vaginal wand 3 times per weeksit on foam roll daily moving side to side and practice your breathing on it.    Exercises - Supine Diaphragmatic Breathing  - 1 x daily - 7 x weekly - 1 sets - 10 reps - Seated Diaphragmatic Breathing  - 1 x daily - 7 x weekly - 1 sets - 10 reps   ASSESSMENT:   CLINICAL IMPRESSION: Patient is a 62 y.o. female who was seen today for physical therapy treatment for overactive bladder and levator spasm. Patient continues to urinate frequently. She will be trying to do the urge to void during the day and see if she can deter the urge. Patient has learned stretches for her pelvic floor and hips to elongate the muscles.  She is trying the Mybetriq to see if it helps with the urge to void. The next time she comes to therapy should be after she is done with her chemotherapy medication and see if that was influencing her bladder frequency. Patient will benefit from skilled therapy to reduce pelvic floor tone and trigger points so she is able to relax her pelvic floor and reduce irritation.    OBJECTIVE IMPAIRMENTS: decreased ROM, decreased strength, increased fascial restrictions, increased muscle spasms,  and pain.    ACTIVITY LIMITATIONS: toileting   PARTICIPATION LIMITATIONS: community activity   PERSONAL FACTORS: Age and 3+ comorbidities: Dilation and curettage of uterus; Bowel resection; Ureteral exploration; Appendectomy; Cesarean section; Abdominal wall defect repair; Hernia repair; Portacath placement (Left, 07/12/2020); Mastectomy w/ sentinel node biopsy (Right, 11/28/2020); Radioactive seed guided axillary sentinel lymph node (Right, 11/28/2020); Radioactive seed guided excisional breast biopsy (Left, 11/28/2020); Port-a-cath removal (Left, 01/30/2021); and Abdominoplasty. Breast cancer  are also affecting patient's functional outcome.    REHAB POTENTIAL: Good   CLINICAL DECISION MAKING: Stable/uncomplicated   EVALUATION COMPLEXITY: Low     GOALS: Goals reviewed with patient? Yes   SHORT TERM GOALS: Target date: 11/07/22   Patient understands how to use vaginal moisturizers to improve the tissue health.  Baseline: Goal status: Met 10/23/22   2.  Patient reports her vaginal irritation decreased >/= 25%.  Baseline: comes and goes Goal status: ongoing 11/21/22   3.  Patient educated on vaginal wand to reduce  there trigger points of the pelvic floor.  Baseline:  Goal status: Met 10/30/22   4.  Patient is able to perform pelvic floor drop with diaphragmatic breathing.  Baseline:  Goal status: INITIAL     LONG TERM GOALS: Target date: 01/07/23   Patient independent with advanced HEP for pelvic floor relaxation and core strength.  Baseline:  Goal status: INITIAL   2.  Patient wakes up </= 2 times per night due to reduction of urgency.  Baseline:  Goal status: INITIAL   3.  Patient is able to fully empty her bladder and not urinate again in 15 minutes due to the ability to fully relax her bladder.  Baseline:  Goal status: INITIAL   4.  Patient understands how to manage irritation of the bladder with relaxation and manual exercises.  Baseline:  Goal status: INITIAL        PLAN:   PT FREQUENCY: 1x/week   PT DURATION: 12 weeks   PLANNED INTERVENTIONS: Therapeutic exercises, Therapeutic activity, Neuromuscular re-education, Patient/Family education, Joint mobilization, Dry Needling, Electrical stimulation, Cryotherapy, Moist heat, Ultrasound, Biofeedback, and Manual therapy   PLAN FOR NEXT SESSION:  review stretches, renewal to continue, see if coming off the chemotherapy has helped the bladder urgency.   Eulis Foster, PT 12/13/22 2:59 PM

## 2022-12-17 ENCOUNTER — Inpatient Hospital Stay: Payer: BC Managed Care – PPO | Attending: Hematology and Oncology | Admitting: Pharmacist

## 2022-12-17 ENCOUNTER — Other Ambulatory Visit: Payer: Self-pay | Admitting: *Deleted

## 2022-12-17 ENCOUNTER — Inpatient Hospital Stay: Payer: BC Managed Care – PPO

## 2022-12-17 ENCOUNTER — Other Ambulatory Visit: Payer: Self-pay

## 2022-12-17 ENCOUNTER — Other Ambulatory Visit (HOSPITAL_COMMUNITY): Payer: Self-pay

## 2022-12-17 VITALS — BP 123/66 | HR 54 | Temp 97.9°F | Resp 16 | Wt 152.8 lb

## 2022-12-17 DIAGNOSIS — Z170 Estrogen receptor positive status [ER+]: Secondary | ICD-10-CM | POA: Diagnosis not present

## 2022-12-17 DIAGNOSIS — C50811 Malignant neoplasm of overlapping sites of right female breast: Secondary | ICD-10-CM | POA: Insufficient documentation

## 2022-12-17 DIAGNOSIS — Z79899 Other long term (current) drug therapy: Secondary | ICD-10-CM | POA: Insufficient documentation

## 2022-12-17 DIAGNOSIS — N390 Urinary tract infection, site not specified: Secondary | ICD-10-CM

## 2022-12-17 DIAGNOSIS — Z79811 Long term (current) use of aromatase inhibitors: Secondary | ICD-10-CM | POA: Insufficient documentation

## 2022-12-17 LAB — CMP (CANCER CENTER ONLY)
ALT: 20 U/L (ref 0–44)
AST: 33 U/L (ref 15–41)
Albumin: 4.1 g/dL (ref 3.5–5.0)
Alkaline Phosphatase: 83 U/L (ref 38–126)
Anion gap: 6 (ref 5–15)
BUN: 12 mg/dL (ref 8–23)
CO2: 29 mmol/L (ref 22–32)
Calcium: 9.4 mg/dL (ref 8.9–10.3)
Chloride: 102 mmol/L (ref 98–111)
Creatinine: 0.92 mg/dL (ref 0.44–1.00)
GFR, Estimated: >60 mL/min (ref >60)
Glucose, Bld: 90 mg/dL (ref 70–99)
Potassium: 3.8 mmol/L (ref 3.5–5.1)
Sodium: 137 mmol/L (ref 135–145)
Total Bilirubin: 0.5 mg/dL (ref 0.3–1.2)
Total Protein: 7 g/dL (ref 6.5–8.1)

## 2022-12-17 LAB — CBC WITH DIFFERENTIAL (CANCER CENTER ONLY)
Abs Immature Granulocytes: 0.01 10*3/uL (ref 0.00–0.07)
Basophils Absolute: 0.1 10*3/uL (ref 0.0–0.1)
Basophils Relative: 1 %
Eosinophils Absolute: 0.1 10*3/uL (ref 0.0–0.5)
Eosinophils Relative: 3 %
HCT: 35.9 % — ABNORMAL LOW (ref 36.0–46.0)
Hemoglobin: 12.1 g/dL (ref 12.0–15.0)
Immature Granulocytes: 0 %
Lymphocytes Relative: 26 %
Lymphs Abs: 1.1 10*3/uL (ref 0.7–4.0)
MCH: 32 pg (ref 26.0–34.0)
MCHC: 33.7 g/dL (ref 30.0–36.0)
MCV: 95 fL (ref 80.0–100.0)
Monocytes Absolute: 0.4 10*3/uL (ref 0.1–1.0)
Monocytes Relative: 9 %
Neutro Abs: 2.6 10*3/uL (ref 1.7–7.7)
Neutrophils Relative %: 61 %
Platelet Count: 174 10*3/uL (ref 150–400)
RBC: 3.78 MIL/uL — ABNORMAL LOW (ref 3.87–5.11)
RDW: 12 % (ref 11.5–15.5)
WBC Count: 4.3 10*3/uL (ref 4.0–10.5)
nRBC: 0 % (ref 0.0–0.2)

## 2022-12-17 LAB — VITAMIN D 25 HYDROXY (VIT D DEFICIENCY, FRACTURES): Vit D, 25-Hydroxy: 39.72 ng/mL (ref 30–100)

## 2022-12-17 LAB — MAGNESIUM: Magnesium: 2 mg/dL (ref 1.7–2.4)

## 2022-12-17 MED ORDER — NERATINIB MALEATE 40 MG PO TABS
200.0000 mg | ORAL_TABLET | Freq: Every day | ORAL | 0 refills | Status: DC
  Filled 2022-12-17: qty 20, 4d supply, fill #0

## 2022-12-17 NOTE — Progress Notes (Signed)
[facility]       Telephone: [phone]?Fax: [phone]   Oncology Clinical Pharmacist Practitioner Progress Note  Teresa Sheppard was contacted via in person visit to discuss her chemotherapy regimen for neratinib which they receive under the care of Dr. Serena Croissant.    Current treatment regimen and start date Neratinib (12/28/21) Letrozole (12/29/20)   Interval History She continues on neratinib 5 tablets (200 mg) by mouth daily on days 1 to 28 of a 28-day cycle. This is being given in combination with letrozole. Therapy is planned to continue until 1 year in the extended adjuvant setting.  Teresa Sheppard was seen today by clinical pharmacy as a follow-up to her neratinib management.  She last saw Dr. Pamelia Hoit on 11/02/22 and clinical pharmacy on 08/21/22.  Response to Therapy Teresa Sheppard continues to do very well on neratinib. She has missed some doses due to holds of neratinib throughout her year of therapy. She asked if she could continue for an extra five days or so and we felt this to be reasonable. She stated she needs about 20 tablets to finish and so we sent this to [facility] today at our visit. She can see clinical pharmacy as needed going forward and she has a follow up visit already scheduled with Dr. Pamelia Hoit in May of next year. She knows she can contact Dr. Earmon [facility] clinic with any questions or concerns in the interim.   She will continue to follow with urology for history of chronic UTIs. Labs, vitals, treatment parameters, and manufacturer guidelines assessing toxicity were reviewed with Teresa Sheppard today. Based on these values, patient is in agreement to continue neratinib therapy at this time.  Allergies Allergies  Allergen Reactions   Shellfish-Derived Products Anaphylaxis    Per patient whenever she consumes a lot gives tightness of throat   Ciprofibrate Other (See Comments)    Joint pain    Propylthiouracil Other (See Comments)   Shellfish Allergy Other  (See Comments)   Shrimp Extract    Hydrocodone Anxiety and Nausea Only   Penicillins Rash and Other (See Comments)    Vitals    12/17/2022    1:07 PM 11/27/2022    1:10 PM 11/02/2022   10:24 AM  Oncology Vitals  Weight 69.31 kg  69.037 kg  Weight (lbs) 152 lbs 13 oz  152 lbs 3 oz  BMI 21.92 kg/m2   21.92 kg/m2  21.84 kg/m2   21.84 kg/m2  Temp 97.9 F (36.6 C)  97.7 F (36.5 C)  Pulse Rate 54 46 56  BP 123/66 108/70 113/57  Resp 16  16  SpO2 100 %  99 %  BSA (m2) 1.85 m2   1.85 m2  1.85 m2   1.85 m2    Laboratory Data    Latest Ref Rng & Units 12/17/2022   12:44 PM 11/02/2022    9:55 AM 08/21/2022   12:46 PM  CBC EXTENDED  WBC 4.0 - 10.5 K/uL 4.3  4.8  3.8   RBC 3.87 - 5.11 MIL/uL 3.78  3.94  4.21   Hemoglobin 12.0 - 15.0 g/dL 62.8  31.5  17.6   HCT 36.0 - 46.0 % 35.9  37.4  39.5   Platelets 150 - 400 K/uL 174  157  164   NEUT# 1.7 - 7.7 K/uL 2.6  3.3  2.0   Lymph# 0.7 - 4.0 K/uL 1.1  0.8  1.3        Latest Ref Rng &  Units 12/17/2022   12:44 PM 11/02/2022    9:55 AM 08/21/2022   12:46 PM  CMP  Glucose 70 - 99 mg/dL 90  56  73   BUN 8 - 23 mg/dL 12  16  14    Creatinine 0.44 - 1.00 mg/dL 9.62  9.52  8.41   Sodium 135 - 145 mmol/L 137  141  139   Potassium 3.5 - 5.1 mmol/L 3.8  3.5  4.2   Chloride 98 - 111 mmol/L 102  106  102   CO2 22 - 32 mmol/L 29  28  31    Calcium 8.9 - 10.3 mg/dL 9.4  9.3  9.4   Total Protein 6.5 - 8.1 g/dL 7.0  7.6  7.9   Total Bilirubin 0.3 - 1.2 mg/dL 0.5  0.5  0.5   Alkaline Phos 38 - 126 U/L 83  90  92   AST 15 - 41 U/L 33  31  39   ALT 0 - 44 U/L 20  19  24      Lab Results  Component Value Date   MG 2.0 12/17/2022   MG 2.0 11/02/2022   MG 2.1 08/21/2022   No results found for: "CA2729"   Adverse Effects Assessment Diarrhea: slightly increased but responding to loperamide. Teresa Sheppard does reduce her dose of neratinib from time to time when traveling (patient preference) but usually is on 5 tablets daily.   Adherence  Assessment Teresa Sheppard reports missing 5 doses over the past 6 weeks.   Reason for missed dose: held while traveling Patient was re-educated on importance of adherence.   Access Assessment Teresa Sheppard is currently receiving her neratinib through [facility] concerns:  none  Medication Reconciliation The patient's medication list was reviewed today with the patient? Yes New medications or herbal supplements have recently been started? No  Any medications have been discontinued? Yes  The medication list was updated and reconciled based on the patient's most recent medication list in the electronic medical record (EMR) including herbal products and OTC medications.   Medications Current Outpatient Medications  Medication Sig Dispense Refill   Calcium-Vitamin D-Vitamin K ([employer] CALCIUM SOFT CHEWS) 650-12.5-40 MG-MCG-MCG CHEW Chew 1 tablet by mouth 2 (two) times daily.     cholecalciferol (VITAMIN D3) 25 MCG (1000 UNIT) tablet Take 2,000 Units by mouth daily.     D-Mannose 500 MG CAPS Take 500 mg by mouth in the morning and at bedtime.     estradiol (ESTRACE VAGINAL) 0.1 MG/GM vaginal cream Place 1 Applicatorful vaginally at bedtime. (Patient taking differently: Place 1 Applicatorful vaginally at bedtime. Per urology, 1g  two times per week) 42.5 g 12   Lactobacillus-Inulin (CULTURELLE ADULT ULT BALANCE PO) Take by mouth daily.     letrozole (FEMARA) 2.5 MG tablet TAKE 1 TABLET(2.5 MG) BY MOUTH DAILY 90 tablet 3   Neratinib Maleate (NERLYNX) 40 MG tablet Take 5 tablets (200 mg total) by mouth daily. Take with food. 20 tablet 0   vitamin E 32440 units external oil Apply topically daily. Vaginally 2-3 times per week     acetaminophen (TYLENOL) 325 MG tablet Take 325 mg by mouth every 6 (six) hours as needed (headaches / pain). (Patient not taking: Reported on 12/17/2022)     LUVENA VAGINAL MOISTURIZER VA Place vaginally. Once a week (Patient not taking:  Reported on 12/17/2022)     mirabegron ER (MYRBETRIQ) 25 MG TB24 tablet Take 1 tablet (25 mg total)  by mouth daily. (Patient not taking: Reported on 12/17/2022) 30 tablet 5   Polyethyl Glycol-Propyl Glycol (SYSTANE OP) Place 1 drop into both eyes daily as needed (dry eyes). (Patient not taking: Reported on 12/17/2022)     No current facility-administered medications for this visit.    Drug-Drug Interactions (DDIs) DDIs were evaluated? Yes Significant DDIs?  She knows to separate out calcium and neratinib as previously discussed with her. The patient was instructed to speak with their health care provider and/or the oral chemotherapy pharmacist before starting any new drug, including prescription or over the counter, natural / herbal products, or vitamins.  Supportive Care Continue loperamide for loose stool  Dosing Assessment Hepatic adjustments needed? No  Renal adjustments needed? No  Toxicity adjustments needed? No  The current dosing regimen is appropriate to continue at this time.  Follow-Up Plan Continue neratinib 5 tablets (200 mg) daily with food Continue letrozole 2.5 mg daily Continue loperamide as needed for loose stool. She uses diet very well to control loose stools. Dr. Pamelia Hoit will see her next May for his follow up She can see clinical pharmacy as needed going forward Continue to follow with urology for history of chronic UTIs  Teresa Sheppard participated in the discussion, expressed understanding, and voiced agreement with the above plan. All questions were answered to her satisfaction. The patient was advised to contact the clinic at [phone] with any questions or concerns prior to her return visit.   I spent 30 minutes assessing and educating the patient.  Peytin Dechert A. Odetta Pink, PharmD, BCOP, CPP  Anselm Lis, RPH-CPP, 12/17/2022  1:32 PM   **Disclaimer: This note was dictated with voice recognition software. Similar sounding words can inadvertently be  transcribed and this note may contain transcription errors which may not have been corrected upon publication of note.**

## 2022-12-18 DIAGNOSIS — S0990XA Unspecified injury of head, initial encounter: Secondary | ICD-10-CM | POA: Diagnosis not present

## 2022-12-18 DIAGNOSIS — S0093XA Contusion of unspecified part of head, initial encounter: Secondary | ICD-10-CM | POA: Diagnosis not present

## 2022-12-19 ENCOUNTER — Encounter: Payer: Self-pay | Admitting: Hematology and Oncology

## 2022-12-19 ENCOUNTER — Other Ambulatory Visit: Payer: Self-pay | Admitting: *Deleted

## 2022-12-20 ENCOUNTER — Encounter: Payer: BC Managed Care – PPO | Admitting: Physical Therapy

## 2022-12-20 LAB — ESTRADIOL, ULTRA SENS: Estradiol, Sensitive: <2.5 pg/mL

## 2022-12-24 DIAGNOSIS — Z139 Encounter for screening, unspecified: Secondary | ICD-10-CM | POA: Diagnosis not present

## 2022-12-24 DIAGNOSIS — Z01419 Encounter for gynecological examination (general) (routine) without abnormal findings: Secondary | ICD-10-CM | POA: Diagnosis not present

## 2022-12-25 ENCOUNTER — Telehealth: Payer: Self-pay | Admitting: Obstetrics and Gynecology

## 2022-12-25 DIAGNOSIS — M810 Age-related osteoporosis without current pathological fracture: Secondary | ICD-10-CM | POA: Diagnosis not present

## 2022-12-25 NOTE — Telephone Encounter (Signed)
Patient wants to discuss discontinuing a medication

## 2023-01-07 DIAGNOSIS — Z170 Estrogen receptor positive status [ER+]: Secondary | ICD-10-CM | POA: Diagnosis not present

## 2023-01-07 DIAGNOSIS — C50811 Malignant neoplasm of overlapping sites of right female breast: Secondary | ICD-10-CM | POA: Diagnosis not present

## 2023-01-09 ENCOUNTER — Encounter: Payer: Self-pay | Admitting: Physical Therapy

## 2023-01-09 ENCOUNTER — Ambulatory Visit: Payer: BC Managed Care – PPO | Attending: Hematology and Oncology | Admitting: Physical Therapy

## 2023-01-09 DIAGNOSIS — C50919 Malignant neoplasm of unspecified site of unspecified female breast: Secondary | ICD-10-CM | POA: Diagnosis not present

## 2023-01-09 DIAGNOSIS — Z483 Aftercare following surgery for neoplasm: Secondary | ICD-10-CM | POA: Diagnosis not present

## 2023-01-09 DIAGNOSIS — Z170 Estrogen receptor positive status [ER+]: Secondary | ICD-10-CM | POA: Diagnosis not present

## 2023-01-09 DIAGNOSIS — M6281 Muscle weakness (generalized): Secondary | ICD-10-CM | POA: Diagnosis not present

## 2023-01-09 DIAGNOSIS — M62838 Other muscle spasm: Secondary | ICD-10-CM | POA: Insufficient documentation

## 2023-01-09 DIAGNOSIS — M810 Age-related osteoporosis without current pathological fracture: Secondary | ICD-10-CM | POA: Diagnosis not present

## 2023-01-09 NOTE — Therapy (Addendum)
 OUTPATIENT PHYSICAL THERAPY TREATMENT NOTE   Patient Name: Teresa Sheppard MRN: [id number] DOB:1960/10/11, 62 y.o., female Today's Date: 01/09/2023  PCP: Ransom Other, MD  REFERRING PROVIDER: Zuleta, Kaitlin G, NP   END OF SESSION:   PT End of Session - 01/09/23 1149     Visit Number 8    Date for PT Re-Evaluation 04/04/23    Authorization Type BCBS    Authorization - Visit Number 17    Authorization - Number of Visits 30    PT Start Time 1145    PT Stop Time 1225    PT Time Calculation (min) 40 min    Activity Tolerance Patient tolerated treatment well    Behavior During Therapy WFL for tasks assessed/performed             Past Medical History:  Diagnosis Date   Anxiety    Breast cancer in female [facility])    Right   Family history of breast cancer 11/07/2020   Family history of pancreatic cancer 11/07/2020   History of kidney stones 2014-2015   Hyperthyroidism    No longer an issue   PONV (postoperative nausea and vomiting)    Thyromegaly    Past Surgical History:  Procedure Laterality Date   ABDOMINAL WALL DEFECT REPAIR     following hernia repair in 2010, no mesh was used   ABDOMINOPLASTY     with hernia repair   APPENDECTOMY     BOWEL RESECTION     2008  while pregnant with last child. Scar tissue from appendectomy wrapped around bowel and was snipped to free bowel.    CESAREAN SECTION     DILATION AND CURETTAGE OF UTERUS     2000 and 2014   HERNIA REPAIR     MASTECTOMY W/ SENTINEL NODE BIOPSY Right 11/28/2020   Procedure: RIGHT MASTECTOMY WITH RIGHT AXILLARY SENTINEL LYMPH NODE BIOPSY;  Surgeon: Ebbie Cough, MD;  Location: [facility] OR;  Service: General;  Laterality: Right;   PORT-A-CATH REMOVAL Left 01/30/2021   Procedure: PORT REMOVAL;  Surgeon: Ebbie Cough, MD;  Location: [facility] OR;  Service: General;  Laterality: Left;  LOCAL   PORTACATH PLACEMENT Left 07/12/2020   Procedure: INSERTION PORT-A-CATH WITH ULTRASOUND GUIDANCE;  Surgeon: Ebbie Cough, MD;  Location: [facility] OR;  Service: General;  Laterality: Left;   RADIOACTIVE SEED GUIDED AXILLARY SENTINEL LYMPH NODE Right 11/28/2020   Procedure: RADIOACTIVE SEED GUIDED RIGHT AXILLARY AXILLARY SENTINEL LYMPH NODE EXCISION;  Surgeon: Ebbie Cough, MD;  Location: [facility] OR;  Service: General;  Laterality: Right;   RADIOACTIVE SEED GUIDED EXCISIONAL BREAST BIOPSY Left 11/28/2020   Procedure: RADIOACTIVE SEED GUIDED LEFT EXCISIONAL BREAST BIOPSY;  Surgeon: Ebbie Cough, MD;  Location: [facility] OR;  Service: General;  Laterality: Left;   ROOT CANAL     2019   TONSILLECTOMY  1969   URETERAL EXPLORATION     dilataion for congenitally small ureter   Patient Active Problem List   Diagnosis Date Noted   Chemotherapy-induced peripheral neuropathy (HCC) 10/24/2021   Vitamin D  deficiency 08/30/2021   Osteoporosis 08/30/2021   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   Genetic testing 11/15/2020   Family history of breast cancer 11/07/2020   Family history of pancreatic cancer 11/07/2020   Dysphagia 10/26/2020   Malignant neoplasm of overlapping sites of right breast in female, estrogen receptor positive (HCC) 06/21/2020   Hyperthyroidism 03/28/2016   REFERRING PROVIDER: Zuleta, Kaitlin G, NP    REFERRING DIAG:  N32.81 (ICD-10-CM) - Overactive bladder  M62.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 I am waking up 3-4 times per night. The last couple of days the pelvic floor is getting better.  I am using vaginal moisturizers. I will need to be on osteoporosis medication.     PAIN:  Are you having pain? Yes NPRS scale: 3/10 Pain location: External and opening, suprapubic   Pain type: aching and shooting  Pain description: intermittent    Aggravating factors: when urine coming out of the bladder  Relieving factors: drink more water, pelvic floor relaxation   PRECAUTIONS: Other: breast cancer   WEIGHT BEARING RESTRICTIONS: No   FALLS:  Has patient fallen in last 6 months? No   LIVING ENVIRONMENT: Lives with: lives with their family   OCCUPATION: sitting   PLOF: Independent   PATIENT GOALS: reduce the number of times going to the bathroom   PERTINENT HISTORY:  Dilation and curettage of uterus; Bowel resection; Ureteral exploration; Appendectomy; Cesarean section; Abdominal wall defect repair; Hernia repair; Portacath placement (Left, 07/12/2020); Mastectomy w/ sentinel node biopsy (Right, 11/28/2020); Radioactive seed guided axillary sentinel lymph node (Right, 11/28/2020); Radioactive seed guided excisional breast biopsy (Left, 11/28/2020); Port-a-cath removal (Left, 01/30/2021); and Abdominoplasty. Breast cancer     BOWEL MOVEMENT: no issues with bowel movements Pain with bowel movement: No   URINATION: Pain with urination: discomfort during urination Fully empty bladder: Yes:  Stream: Weak Urgency: Yes: at night and not as much during the day Frequency: night time voids 3-4 times; during the day goes every 2-3 hours Leakage:  none Pads: No   INTERCOURSE: not sexually active due to the pain    PREGNANCY: Vaginal deliveries 3 Tearing Yes:   C-section deliveries 1 Currently pregnant No   PROLAPSE: None     OBJECTIVE:    DIAGNOSTIC FINDINGS:  none     COGNITION: Overall cognitive status: Within functional limits for tasks assessed  SENSATION: Light touch: Appears intact Proprioception: Appears intact     POSTURE: No  Significant postural limitations   PELVIC ALIGNMENT:   LUMBARAROM/PROM:   A/PROM A/PROM  eval 01/09/23  Flexion full full  Extension Decreased by 50% full  Right lateral flexion Decreased by 25% full  Left lateral flexion Decreased by 25% full  Right rotation Decreased by 25% full  Left rotation Decreased by 25% full   (Blank rows = not tested)   LOWER EXTREMITY ROM:   Passive ROM Right eval Left eval Right  11/26/22 Left 11/26/22  Hip external rotation 40 45 50 65   (Blank rows = not tested)   LOWER EXTREMITY MMT:   MMT Right eval Left eval Right 01/09/23 Left 01/09/23  Hip flexion 4/5 4/5 5/5 5/5  Hip extension 3/5 3/5 4/5 4/5  Hip abduction 3/5 3/5 5/5 5/5  Hip adduction 4/5 4/5 5/5 5/5    PALPATION:   General  decreased movement of the lower rib cage and abdominal tightness                 External Perineal Exam firmness of the perineal tissue                             Internal Pelvic Floor tenderness in right obturator internist, along the sides of the urethra and bladder, iliococcygeus   Patient confirms identification and approves PT to assess internal pelvic floor and treatment Yes   PELVIC MMT:   MMT eval 10/30/22  Vaginal 3/5 3/5 with circular hug  (Blank rows = not tested)         TONE: increased   PROLAPSE: none   TODAY'S TREATMENT:   01/09/23 Exercises: Stretches/mobility: Sitting piriformis stretch holding 30 sec bil.  Happy baby in supine and sitting Squats to stretch the hips and pelvic floor Hip flexor stretch both sides but did not feel a great stretch Diaphragmatic breathing in sitting Reviewed using the vaginal wand but she is not ready at this time due to going on vacation soon     12/13/22 Neuromuscular re-education: Down training: Educated patient the urge to urinate drill for during the day and to drink 64 ounces of water per day. Discussed with patient on holding her urine for 2 hours.  Exercises: Stretches/mobility: Sitting  piriformis stretch holding 30 sec bil.  Happy baby in supine and sitting Squats to stretch the hips and pelvic floor Hip flexor stretch both sides but did not feel a great stretch  11/26/22 Manual: mobilization: Right hip mobilization for posterior and lateral glide Exercises: Stretches/mobility: Educated patient on placing the estrogen cream into the vaginal canal and massage along the urethra Discussed with patient on the side effects of her cancer medication and it states bladder pain and urge to urinate are side effects.  Strengthening: Contract relax to right hip to improve right hip ER Standing hip abduction 40# on multihip machine 2 x 10 Therapeutic activities: Functional strengthening activities: Educated patient on filling out a bladder diary to see how many times she is awake to urinate and how much urine she is urinating.     PATIENT EDUCATION: 10/23/22 Education details: Access Code: [email or web address] Person educated: Patient Education method: Explanation, Demonstration, Tactile cues, Verbal cues, and Handouts Education comprehension: verbalized understanding, returned demonstration, verbal cues required, tactile cues required, and needs further education     HOME EXERCISE PROGRAM: 10/23/22 Access Code: [email or web address] URL: [email or web address] Date: 10/23/2022 Prepared  by: Channing Pereyra   Program Notes vaginal wand 3 times per weeksit on foam roll daily moving side to side and practice your breathing on it.    Exercises - Supine Diaphragmatic Breathing  - 1 x daily - 7 x weekly - 1 sets - 10 reps - Seated Diaphragmatic Breathing  - 1 x daily - 7 x weekly - 1 sets - 10 reps   ASSESSMENT:   CLINICAL IMPRESSION: Patient is a 62 y.o. female who was seen today for physical therapy treatment for overactive bladder and levator spasm. Patient gets up to urinate at night 3-4 times instead of 6-8 times. She has just finished her chemotherapy medication and will see over time  if the frequent urination at night will decrease further. She continues to have some vaginal irritation due to the dryness and is using the vaginal moisturizers. Patient will benefit from skilled therapy to reduce pelvic floor tone and trigger points so she is able to relax her pelvic floor and reduce irritation.    OBJECTIVE IMPAIRMENTS: decreased ROM, decreased strength, increased fascial restrictions, increased muscle spasms, and pain.    ACTIVITY LIMITATIONS: toileting   PARTICIPATION LIMITATIONS: community activity   PERSONAL FACTORS: Age and 3+ comorbidities: Dilation and curettage of uterus; Bowel resection; Ureteral exploration; Appendectomy; Cesarean section; Abdominal wall defect repair; Hernia repair; Portacath placement (Left, 07/12/2020); Mastectomy w/ sentinel node biopsy (Right, 11/28/2020); Radioactive seed guided axillary sentinel lymph node (Right, 11/28/2020); Radioactive seed guided excisional breast biopsy (Left, 11/28/2020); Port-a-cath removal (Left, 01/30/2021); and Abdominoplasty. Breast cancer  are also affecting patient's functional outcome.    REHAB POTENTIAL: Good   CLINICAL DECISION MAKING: Stable/uncomplicated   EVALUATION COMPLEXITY: Low     GOALS: Goals reviewed with patient? Yes   SHORT TERM GOALS: Target date: 11/07/22   Patient understands how to use vaginal moisturizers to improve the tissue health.  Baseline: Goal status: Met 10/23/22   2.  Patient reports her vaginal irritation decreased >/= 25%.  Baseline: comes and goes Goal status: Met 01/09/23   3.  Patient educated on vaginal wand to reduce there trigger points of the pelvic floor.  Baseline:  Goal status: Met 10/30/22   4.  Patient is able to perform pelvic floor drop with diaphragmatic breathing.  Baseline:  Goal status: Met 01/09/23     LONG TERM GOALS: Target date: 04/04/23   Patient independent with advanced HEP for pelvic floor relaxation and core strength.  Baseline:  Goal  status: ongoing 01/09/23   2.  Patient wakes up </= 2 times per night due to reduction of urgency.  Baseline: wakes up 3-4 times per night Goal status: INITIAL   3.  Patient is able to fully empty her bladder and not urinate again in 15 minutes due to the ability to fully relax her bladder.  Baseline:  Goal status: Met 01/09/23   4.  Patient understands how to manage irritation of the bladder with relaxation and manual exercises.  Baseline:  Goal status: ongoing 01/09/23       PLAN:   PT FREQUENCY: 1x/week   PT DURATION: 12 weeks   PLANNED INTERVENTIONS: Therapeutic exercises, Therapeutic activity, Neuromuscular re-education, Patient/Family education, Joint mobilization, Dry Needling, Electrical stimulation, Cryotherapy, Moist heat, Ultrasound, Biofeedback, and Manual therapy   PLAN FOR NEXT SESSION:  review stretches, see if coming off the chemotherapy has helped the bladder urgency.   Channing Pereyra, PT 01/09/23 12:20 PM   PHYSICAL THERAPY DISCHARGE SUMMARY  Visits from Start of Care: 8  Current functional level related to goals / functional outcomes: See above. Patient did not return after her last visit on 01/09/23.    Remaining deficits: See above.    Education / Equipment: HEP   Patient agrees to discharge. Patient goals were not met. Patient is being discharged due to not returning since the last visit. Thank you for the referral.   Channing Pereyra, PT 05/26/24 3:15 PM

## 2023-01-10 ENCOUNTER — Other Ambulatory Visit (HOSPITAL_COMMUNITY): Payer: Self-pay

## 2023-01-15 ENCOUNTER — Ambulatory Visit: Payer: BC Managed Care – PPO | Admitting: Obstetrics and Gynecology

## 2023-01-16 ENCOUNTER — Other Ambulatory Visit (HOSPITAL_COMMUNITY): Payer: Self-pay

## 2023-01-16 ENCOUNTER — Encounter: Payer: Self-pay | Admitting: Hematology and Oncology

## 2023-01-16 DIAGNOSIS — C50811 Malignant neoplasm of overlapping sites of right female breast: Secondary | ICD-10-CM | POA: Diagnosis not present

## 2023-01-18 ENCOUNTER — Other Ambulatory Visit: Payer: Self-pay | Admitting: Internal Medicine

## 2023-01-18 ENCOUNTER — Ambulatory Visit
Admission: RE | Admit: 2023-01-18 | Discharge: 2023-01-18 | Disposition: A | Discharge status: Home / Self Care | Payer: BC Managed Care – PPO | Source: Ambulatory Visit | Attending: Internal Medicine | Admitting: Internal Medicine

## 2023-01-18 DIAGNOSIS — C50919 Malignant neoplasm of unspecified site of unspecified female breast: Secondary | ICD-10-CM | POA: Diagnosis not present

## 2023-01-18 DIAGNOSIS — G4452 New daily persistent headache (NDPH): Secondary | ICD-10-CM

## 2023-01-18 DIAGNOSIS — T148XXA Other injury of unspecified body region, initial encounter: Secondary | ICD-10-CM | POA: Diagnosis not present

## 2023-01-18 DIAGNOSIS — R519 Headache, unspecified: Secondary | ICD-10-CM | POA: Diagnosis not present

## 2023-01-21 ENCOUNTER — Telehealth: Payer: Self-pay | Admitting: Physical Therapy

## 2023-01-21 ENCOUNTER — Ambulatory Visit: Payer: BC Managed Care – PPO | Admitting: Physical Therapy

## 2023-01-21 NOTE — Telephone Encounter (Signed)
Called patient about her missed appointment today at 14:00. She forgot to cancel it. She will reschedule when back in town.  Teresa Sheppard, PT @7 /29/24@ 2:24 PM

## 2023-01-29 ENCOUNTER — Other Ambulatory Visit: Payer: Self-pay

## 2023-02-17 ENCOUNTER — Encounter: Payer: Self-pay | Admitting: Hematology and Oncology

## 2023-02-22 DIAGNOSIS — R8271 Bacteriuria: Secondary | ICD-10-CM | POA: Diagnosis not present

## 2023-02-27 DIAGNOSIS — D2272 Melanocytic nevi of left lower limb, including hip: Secondary | ICD-10-CM | POA: Diagnosis not present

## 2023-02-27 DIAGNOSIS — L821 Other seborrheic keratosis: Secondary | ICD-10-CM | POA: Diagnosis not present

## 2023-02-27 DIAGNOSIS — D2271 Melanocytic nevi of right lower limb, including hip: Secondary | ICD-10-CM | POA: Diagnosis not present

## 2023-02-27 DIAGNOSIS — D2239 Melanocytic nevi of other parts of face: Secondary | ICD-10-CM | POA: Diagnosis not present

## 2023-02-28 DIAGNOSIS — Z853 Personal history of malignant neoplasm of breast: Secondary | ICD-10-CM | POA: Diagnosis not present

## 2023-02-28 DIAGNOSIS — H25813 Combined forms of age-related cataract, bilateral: Secondary | ICD-10-CM | POA: Diagnosis not present

## 2023-02-28 DIAGNOSIS — N390 Urinary tract infection, site not specified: Secondary | ICD-10-CM | POA: Diagnosis not present

## 2023-02-28 DIAGNOSIS — H21341 Primary cyst of pars plana, right eye: Secondary | ICD-10-CM | POA: Diagnosis not present

## 2023-02-28 DIAGNOSIS — Z83511 Family history of glaucoma: Secondary | ICD-10-CM | POA: Diagnosis not present

## 2023-03-01 DIAGNOSIS — M810 Age-related osteoporosis without current pathological fracture: Secondary | ICD-10-CM | POA: Diagnosis not present

## 2023-03-05 ENCOUNTER — Other Ambulatory Visit: Payer: Self-pay | Admitting: Hematology and Oncology

## 2023-03-06 DIAGNOSIS — Z9189 Other specified personal risk factors, not elsewhere classified: Secondary | ICD-10-CM | POA: Diagnosis not present

## 2023-03-07 DIAGNOSIS — N390 Urinary tract infection, site not specified: Secondary | ICD-10-CM | POA: Diagnosis not present

## 2023-03-13 ENCOUNTER — Ambulatory Visit: Payer: BC Managed Care – PPO | Admitting: Physical Therapy

## 2023-03-24 ENCOUNTER — Encounter: Payer: Self-pay | Admitting: Hematology and Oncology

## 2023-03-25 ENCOUNTER — Other Ambulatory Visit: Payer: Self-pay | Admitting: Hematology and Oncology

## 2023-03-25 DIAGNOSIS — Z170 Estrogen receptor positive status [ER+]: Secondary | ICD-10-CM

## 2023-03-25 DIAGNOSIS — G620 Drug-induced polyneuropathy: Secondary | ICD-10-CM

## 2023-03-25 MED ORDER — LETROZOLE 2.5 MG PO TABS: 2.5000 mg | ORAL_TABLET | Freq: Every day | ORAL | 3 refills | Status: DC

## 2023-03-25 NOTE — Therapy (Signed)
OUTPATIENT PHYSICAL THERAPY  UPPER EXTREMITY ONCOLOGY EVALUATION  Patient Name: Teresa Sheppard MRN: [id number] DOB:June 09, 1961, 62 y.o., female Today's Date: 03/25/2023  END OF SESSION:   Past Medical History:  Diagnosis Date   Anxiety    Breast cancer in female [facility])    Right   Family history of breast cancer 11/07/2020   Family history of pancreatic cancer 11/07/2020   History of kidney stones 2014-2015   Hyperthyroidism    No longer an issue   PONV (postoperative nausea and vomiting)    Thyromegaly    Past Surgical History:  Procedure Laterality Date   ABDOMINAL WALL DEFECT REPAIR     following hernia repair in 2010, no mesh was used   ABDOMINOPLASTY     with hernia repair   APPENDECTOMY     BOWEL RESECTION     2008  while pregnant with last child. Scar tissue from appendectomy wrapped around bowel and was snipped to free bowel.    CESAREAN SECTION     DILATION AND CURETTAGE OF UTERUS     2000 and 2014   HERNIA REPAIR     MASTECTOMY W/ SENTINEL NODE BIOPSY Right 11/28/2020   Procedure: RIGHT MASTECTOMY WITH RIGHT AXILLARY SENTINEL LYMPH NODE BIOPSY;  Surgeon: Emelia Loron, MD;  Location: [facility] OR;  Service: General;  Laterality: Right;   PORT-A-CATH REMOVAL Left 01/30/2021   Procedure: PORT REMOVAL;  Surgeon: Emelia Loron, MD;  Location: [facility] OR;  Service: General;  Laterality: Left;  LOCAL   PORTACATH PLACEMENT Left 07/12/2020   Procedure: INSERTION PORT-A-CATH WITH ULTRASOUND GUIDANCE;  Surgeon: Emelia Loron, MD;  Location: [facility] OR;  Service: General;  Laterality: Left;   RADIOACTIVE SEED GUIDED AXILLARY SENTINEL LYMPH NODE Right 11/28/2020   Procedure: RADIOACTIVE SEED GUIDED RIGHT AXILLARY AXILLARY SENTINEL LYMPH NODE EXCISION;  Surgeon: Emelia Loron, MD;  Location: [facility] OR;  Service: General;  Laterality: Right;   RADIOACTIVE SEED GUIDED EXCISIONAL BREAST BIOPSY Left 11/28/2020   Procedure: RADIOACTIVE SEED GUIDED LEFT EXCISIONAL BREAST BIOPSY;   Surgeon: Emelia Loron, MD;  Location: [facility] OR;  Service: General;  Laterality: Left;   ROOT CANAL     2019   TONSILLECTOMY  1969   URETERAL EXPLORATION     dilataion for congenitally small ureter   Patient Active Problem List   Diagnosis Date Noted   Chemotherapy-induced peripheral neuropathy (HCC) 10/24/2021   Vitamin D deficiency 08/30/2021   Osteoporosis 08/30/2021   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   Genetic testing 11/15/2020   Family history of breast cancer 11/07/2020   Family history of pancreatic cancer 11/07/2020   Dysphagia 10/26/2020   Malignant neoplasm of overlapping sites of right breast in female, estrogen receptor positive (HCC) 06/21/2020   Hyperthyroidism 03/28/2016    PCP: ***  REFERRING PROVIDER: Serena Croissant, MD  REFERRING DIAG: ***  THERAPY DIAG:  No diagnosis found.  ONSET DATE: ***  Rationale for Evaluation and Treatment: Rehabilitation  SUBJECTIVE:  SUBJECTIVE STATEMENT: Lymphedema check   PERTINENT HISTORY:  R breast cancer, left outer quadrant, triple positive, Ki 67 - 60%, grade 2 -3 IDC, neoadjuvant chemo and immuno therapy, right mastectomy on 11/28/2020 with 4 nodes removed on the right. She also had radiation ending 02/17/2021. and she had an excision of breast tissue on the left that was benign   PAIN:  Are you having pain? {yes/no:20286} NPRS scale: ***/10 Pain location: *** Pain orientation: {Pain Orientation:25161}  PAIN TYPE: {type:313116} Pain description: {PAIN DESCRIPTION:21022940}  Aggravating factors: *** Relieving factors: ***  PRECAUTIONS: {Therapy precautions:24002}  RED FLAGS: {PT Red Flags:29287}   WEIGHT BEARING RESTRICTIONS: {Yes ***/No:24003}  FALLS:  Has patient fallen in last 6 months? {fallsyesno:27318}  LIVING  ENVIRONMENT: Lives with: lives with their familyspouse and son Lives in: House/apartment  OCCUPATION: tutoring?  LEISURE: ***  HAND DOMINANCE: right   PRIOR LEVEL OF FUNCTION: Independent  PATIENT GOALS: ***   OBJECTIVE: Note: Objective measures were completed at Evaluation unless otherwise noted.  COGNITION: Overall cognitive status: Within functional limits for tasks assessed   PALPATION: ***  OBSERVATIONS / OTHER ASSESSMENTS: ***  SENSATION: Light touch: {intact/deficits:24005}  POSTURE: forward head, rounded shoulders  UPPER EXTREMITY AROM/PROM:  A/PROM RIGHT   eval   Shoulder extension   Shoulder flexion   Shoulder abduction   Shoulder internal rotation   Shoulder external rotation     (Blank rows = not tested)  A/PROM LEFT   eval  Shoulder extension   Shoulder flexion   Shoulder abduction   Shoulder internal rotation   Shoulder external rotation     (Blank rows = not tested)  CERVICAL AROM: All within normal limits:    Percent limited  Flexion   Extension   Right lateral flexion   Left lateral flexion   Right rotation   Left rotation     UPPER EXTREMITY STRENGTH:   LYMPHEDEMA ASSESSMENTS:   SURGERY TYPE/DATE: 11/28/2020 Right Mastectomy with SLNB  NUMBER OF LYMPH NODES REMOVED: 0+/4  CHEMOTHERAPY: Neoadjuvant  RADIATION:ended 02/17/2021  HORMONE TREATMENT: NO  INFECTIONS: NO   LYMPHEDEMA ASSESSMENTS:   LANDMARK RIGHT  eval  At axilla    15 cm proximal to olecranon process   10 cm proximal to olecranon process   Olecranon process   15 cm proximal to ulnar styloid process   10 cm proximal to ulnar styloid process   Just proximal to ulnar styloid process   Across hand at thumb web space   At base of 2nd digit   (Blank rows = not tested)  LANDMARK LEFT  eval  At axilla    15 cm proximal to olecranon process   10 cm proximal to olecranon process   Olecranon process   15 cm proximal to ulnar styloid process   10 cm  proximal to ulnar styloid process   Just proximal to ulnar styloid process   Across hand at thumb web space   At base of 2nd digit   (Blank rows = not tested)   FUNCTIONAL TESTS:  {Functional tests:24029}  GAIT: Distance walked: *** Assistive device utilized: {Assistive devices:23999} Level of assistance: {Levels of assistance:24026} Comments: ***    QUICK DASH SURVEY: ***   TODAY'S TREATMENT:  DATE: 03/26/2023    PATIENT EDUCATION:  Education details: *** Person educated: {Person educated:25204} Education method: {Education Method:25205} Education comprehension: {Education Comprehension:25206}  HOME EXERCISE PROGRAM: ***  ASSESSMENT:  CLINICAL IMPRESSION: Patient is a 62 y.o. female who was seen today for physical therapy evaluation and treatment for ***.    OBJECTIVE IMPAIRMENTS: {opptimpairments:25111}.   ACTIVITY LIMITATIONS: {activitylimitations:27494}  PARTICIPATION LIMITATIONS: {participationrestrictions:25113}  PERSONAL FACTORS: {Personal factors:25162} are also affecting patient's functional outcome.   REHAB POTENTIAL: Excellent  CLINICAL DECISION MAKING: Stable/uncomplicated  EVALUATION COMPLEXITY: Low  GOALS: Goals reviewed with patient? {yes/no:20286}  SHORT TERM GOALS: Target date: ***  *** Baseline: Goal status: INITIAL  2.  *** Baseline:  Goal status: INITIAL  3.  *** Baseline:  Goal status: INITIAL  4.  *** Baseline:  Goal status: INITIAL  5.  *** Baseline:  Goal status: INITIAL  6.  *** Baseline:  Goal status: INITIAL  LONG TERM GOALS: Target date: ***  *** Baseline:  Goal status: INITIAL  2.  *** Baseline:  Goal status: INITIAL  3.  *** Baseline:  Goal status: INITIAL  4.  *** Baseline:  Goal status: INITIAL  5.  *** Baseline:  Goal status: INITIAL  6.   *** Baseline:  Goal status: INITIAL  PLAN:  PT FREQUENCY: {rehab frequency:25116}  PT DURATION: {rehab duration:25117}  PLANNED INTERVENTIONS: {rehab planned interventions:25118::"Therapeutic exercises","Therapeutic activity","Neuromuscular re-education","Balance training","Gait training","Patient/Family education","Self Care","Joint mobilization"}  PLAN FOR NEXT SESSION: ***  Waynette Buttery, PT 03/25/2023, 8:13 PM

## 2023-03-26 ENCOUNTER — Other Ambulatory Visit: Payer: Self-pay

## 2023-03-26 ENCOUNTER — Ambulatory Visit: Payer: BC Managed Care – PPO | Attending: Hematology and Oncology

## 2023-03-26 DIAGNOSIS — Z483 Aftercare following surgery for neoplasm: Secondary | ICD-10-CM | POA: Insufficient documentation

## 2023-03-26 DIAGNOSIS — Z170 Estrogen receptor positive status [ER+]: Secondary | ICD-10-CM | POA: Insufficient documentation

## 2023-03-26 DIAGNOSIS — C50811 Malignant neoplasm of overlapping sites of right female breast: Secondary | ICD-10-CM | POA: Diagnosis not present

## 2023-03-26 DIAGNOSIS — M25611 Stiffness of right shoulder, not elsewhere classified: Secondary | ICD-10-CM | POA: Diagnosis not present

## 2023-03-27 DIAGNOSIS — R300 Dysuria: Secondary | ICD-10-CM | POA: Diagnosis not present

## 2023-03-27 DIAGNOSIS — R351 Nocturia: Secondary | ICD-10-CM | POA: Diagnosis not present

## 2023-03-27 DIAGNOSIS — R350 Frequency of micturition: Secondary | ICD-10-CM | POA: Diagnosis not present

## 2023-04-03 ENCOUNTER — Ambulatory Visit: Payer: BC Managed Care – PPO

## 2023-04-03 DIAGNOSIS — Z483 Aftercare following surgery for neoplasm: Secondary | ICD-10-CM

## 2023-04-03 DIAGNOSIS — M25611 Stiffness of right shoulder, not elsewhere classified: Secondary | ICD-10-CM | POA: Diagnosis not present

## 2023-04-03 DIAGNOSIS — C50811 Malignant neoplasm of overlapping sites of right female breast: Secondary | ICD-10-CM

## 2023-04-03 DIAGNOSIS — Z170 Estrogen receptor positive status [ER+]: Secondary | ICD-10-CM | POA: Diagnosis not present

## 2023-04-03 NOTE — Therapy (Signed)
OUTPATIENT PHYSICAL THERAPY  UPPER EXTREMITY ONCOLOGY EVALUATION  Patient Name: Teresa Sheppard MRN: [id number] DOB:02-26-1961, 62 y.o., female Today's Date: 04/03/2023  END OF SESSION:  PT End of Session - 04/03/23 0900     Visit Number 2    Number of Visits 8    Date for PT Re-Evaluation 04/23/23    Authorization Type BC[employer]    Authorization - Visit Number 18    Authorization - Number of Visits 30    PT Start Time 0902    PT Stop Time 0954    PT Time Calculation (min) 52 min    Activity Tolerance Patient tolerated treatment well    Behavior During Therapy [facility] for tasks assessed/performed             Past Medical History:  Diagnosis Date   Anxiety    Breast cancer in female [facility])    Right   Family history of breast cancer 11/07/2020   Family history of pancreatic cancer 11/07/2020   History of kidney stones 2014-2015   Hyperthyroidism    No longer an issue   PONV (postoperative nausea and vomiting)    Thyromegaly    Past Surgical History:  Procedure Laterality Date   ABDOMINAL WALL DEFECT REPAIR     following hernia repair in 2010, no mesh was used   ABDOMINOPLASTY     with hernia repair   APPENDECTOMY     BOWEL RESECTION     2008  while pregnant with last child. Scar tissue from appendectomy wrapped around bowel and was snipped to free bowel.    CESAREAN SECTION     DILATION AND CURETTAGE OF UTERUS     2000 and 2014   HERNIA REPAIR     MASTECTOMY W/ SENTINEL NODE BIOPSY Right 11/28/2020   Procedure: RIGHT MASTECTOMY WITH RIGHT AXILLARY SENTINEL LYMPH NODE BIOPSY;  Surgeon: Emelia Loron, MD;  Location: [facility] OR;  Service: General;  Laterality: Right;   PORT-A-CATH REMOVAL Left 01/30/2021   Procedure: PORT REMOVAL;  Surgeon: Emelia Loron, MD;  Location: [facility] OR;  Service: General;  Laterality: Left;  LOCAL   PORTACATH PLACEMENT Left 07/12/2020   Procedure: INSERTION PORT-A-CATH WITH ULTRASOUND GUIDANCE;  Surgeon: Emelia Loron, MD;  Location: [facility]  OR;  Service: General;  Laterality: Left;   RADIOACTIVE SEED GUIDED AXILLARY SENTINEL LYMPH NODE Right 11/28/2020   Procedure: RADIOACTIVE SEED GUIDED RIGHT AXILLARY AXILLARY SENTINEL LYMPH NODE EXCISION;  Surgeon: Emelia Loron, MD;  Location: [facility] OR;  Service: General;  Laterality: Right;   RADIOACTIVE SEED GUIDED EXCISIONAL BREAST BIOPSY Left 11/28/2020   Procedure: RADIOACTIVE SEED GUIDED LEFT EXCISIONAL BREAST BIOPSY;  Surgeon: Emelia Loron, MD;  Location: [facility] OR;  Service: General;  Laterality: Left;   ROOT CANAL     2019   TONSILLECTOMY  1969   URETERAL EXPLORATION     dilataion for congenitally small ureter   Patient Active Problem List   Diagnosis Date Noted   Chemotherapy-induced peripheral neuropathy (HCC) 10/24/2021   Vitamin D deficiency 08/30/2021   Osteoporosis 08/30/2021   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   Genetic testing 11/15/2020   Family history of breast cancer 11/07/2020   Family history of pancreatic cancer 11/07/2020   Dysphagia 10/26/2020   Malignant neoplasm of overlapping sites of right breast in female, estrogen receptor positive (HCC) 06/21/2020   Hyperthyroidism 03/28/2016    PCP: Dr. Colbert Ewing  REFERRING PROVIDER: Serena Croissant, MD  REFERRING DIAG: Right UE questionable lymphedema, pain  THERAPY DIAG:  Malignant neoplasm of  overlapping sites of right breast in female, estrogen receptor positive (HCC)  Aftercare following surgery for neoplasm  Stiffness of right shoulder, not elsewhere classified  ONSET DATE: a week ago  Rationale for Evaluation and Treatment: Rehabilitation  SUBJECTIVE:                                                                                                                                                                                           SUBJECTIVE STATEMENT:  I still feel a lot of pulling in my forearm.  I haven't done the NTS much,I haven't worn the sleeve much. Maybe 2 days early on.and it  seemed to reduce the pain some   PERTINENT HISTORY:  R breast cancer, left outer quadrant, triple positive, Ki 67 - 60%, grade 2 -3 IDC, neoadjuvant chemo and immuno therapy, right mastectomy on 11/28/2020 with 4 nodes removed on the right. She also had radiation ending 02/17/2021. and she had an excision of breast tissue on the left that was benign   PAIN:  Are you having pain? No, occasionally with certain movements, but it is tender to touch NPRS scale: 0-1/10 Pain location: right forearm Pain orientation: Right  PAIN TYPE: tight and tingling, tender Pain description: intermittent  Aggravating factors: certain movements, not sure what Relieving factors: ibuprofen, sleeve   PRECAUTIONS: Right UE lymphedema risk  RED FLAGS: None   WEIGHT BEARING RESTRICTIONS: No  FALLS:  Has patient fallen in last 6 months? No  LIVING ENVIRONMENT: Lives with: lives with their familyspouse and son Lives in: House/apartment  OCCUPATION: tutoring, SAT prep  LEISURE: walking, wts,  HAND DOMINANCE: right   PRIOR LEVEL OF FUNCTION: Independent  PATIENT GOALS: Get rid of right UE discomfort, make sure its not lymphedema   OBJECTIVE: Note: Objective measures were completed at Evaluation unless otherwise noted.  COGNITION: Overall cognitive status: Within functional limits for tasks assessed   PALPATION: Palpable cord from mid upper arm across elbow  OBSERVATIONS / OTHER ASSESSMENTS:  Pt  c/o pulling/tightness from mid upper arm to forearm. In supine assessed arm; Cording noted mid upper arm extending into forearm with tightness noted with MFR techniques.   SENSATION: Light touch: Deficits    POSTURE: forward head, rounded shoulders  UPPER EXTREMITY AROM/PROM:  A/PROM RIGHT   eval   Shoulder extension 50  Shoulder flexion 153  Shoulder abduction 140 pulls in upper arm,elbow  Shoulder internal rotation 72  Shoulder external rotation 96    (Blank rows = not tested)  A/PROM LEFT    eval  Shoulder extension 53  Shoulder flexion 153  Shoulder abduction 170  Shoulder internal rotation 76  Shoulder external rotation 95    (Blank rows = not tested)  CERVICAL AROM: All within functional limits:   UPPER EXTREMITY STRENGTH:  WFL  LYMPHEDEMA ASSESSMENTS:   SURGERY TYPE/DATE: 11/28/2020 Right Mastectomy with SLNB  NUMBER OF LYMPH NODES REMOVED: 0+/4  CHEMOTHERAPY: Neoadjuvant  RADIATION:ended 02/17/2021  HORMONE TREATMENT: NO  INFECTIONS: NO   LYMPHEDEMA ASSESSMENTS:   LANDMARK RIGHT  eval  At axilla    15 cm proximal to olecranon process 27.5  10 cm proximal to olecranon process 27.2  Olecranon process 22.6  15 cm proximal to ulnar styloid process 20.3  10 cm proximal to ulnar styloid process 17.4  Just proximal to ulnar styloid process 14.6  Across hand at thumb web space 16.8  At base of 2nd digit 5.7  (Blank rows = not tested)  LANDMARK LEFT  eval  At axilla    15 cm proximal to olecranon process 29.3  10 cm proximal to olecranon process 28.9  Olecranon process 24.2  15 cm proximal to ulnar styloid process 20.4  10 cm proximal to ulnar styloid process 18.0  Just proximal to ulnar styloid process 15.0  Across hand at thumb web space 17.2  At base of 2nd digit 5.7  (Blank rows = not tested)   FUNCTIONAL TESTS:    GAIT:  normal  QUICK DASH SURVEY: 18%   TODAY'S TREATMENT:                                                                                                                                          DATE: 04/02/2023 Demonstrated NTS in different ranges of motion so pt would feel the stretch Wall arc x 3 for ROM Supine wand scaption x 4 MFR to right upper extremity axilla, upper arm, antecubital fossa, and forearm Long axis distraction with oscillations to relax arm between In supine: Short neck, 5 diaphragmatic breaths, L axillary nodes and establishment of interaxillary pathway, R inguinal nodes and establishment of  axilloinguinal pathway, then R UE working proximal to distal, moving inner upper arm outwards and upwards, and doing both sides of forearms, spending extra time in any areas of fibrosis then retracing all steps Printed cording information. Encouraged wearing compression sleeve, doing exs and NTS     03/26/2023    PATIENT EDUCATION:  Education details: 4 post op exercises, NTS, discussed cording, wear compression sleeve Person educated: Patient Education method: Explanation Education comprehension: verbalized understanding and returned demonstration  HOME EXERCISE PROGRAM: Post op exercises, NTS with wrist extension  ASSESSMENT:  CLINICAL IMPRESSION:  Initiated MFR techniques throughout right UE with 1 pop noted at upper arm. Pt reported feeling looser after rx. Encouraged pt to wear sleeve and perform exs and NTS.  OBJECTIVE IMPAIRMENTS: decreased activity tolerance, decreased knowledge of condition, decreased ROM, increased fascial restrictions, impaired UE functional use, postural dysfunction, and pain.   ACTIVITY LIMITATIONS: reach over head  PARTICIPATION LIMITATIONS:  not restricted with most activities except reaching  PERSONAL FACTORS: 1-2 comorbidities: Right breast cancer s/p Mastectomy, chemo and radiation  are also affecting patient's functional outcome.   REHAB POTENTIAL: Excellent  CLINICAL DECISION MAKING: Stable/uncomplicated  EVALUATION COMPLEXITY: Low  GOALS: Goals reviewed with patient? Yes  SHORT TERM GOALS= LONG TERM GOALS: Target date: 04/23/2023  Pt will be independent in HEP to improve right shoulder ROM, and strength and decrease cording Baseline: Goal status: INITIAL  2.  Pt will have no right UE pain or sensitivity Baseline:  Goal status: INITIAL  3.  Pts right shoulder ROM will be WNL without complaints of tightness in arm Baseline:  Goal status: INITIAL  4.  Pt will be progressed Through ABC strength program when cording is  resolved Baseline:  Goal status: INITIAL   PLAN:  PT FREQUENCY: 2x/week  PT DURATION: 4 weeks  PLANNED INTERVENTIONS: Therapeutic exercises, Neuromuscular re-education, Patient/Family education, Self Care, Orthotic/Fit training, Manual therapy, and Re-evaluation  PLAN FOR NEXT SESSION: MFR to right UE cording, PROM, update HEP prn, progress to strength when ready  Waynette Buttery, PT 04/03/2023, 9:56 AM

## 2023-04-03 NOTE — Patient Instructions (Signed)

## 2023-04-05 DIAGNOSIS — C50911 Malignant neoplasm of unspecified site of right female breast: Secondary | ICD-10-CM | POA: Diagnosis not present

## 2023-04-05 DIAGNOSIS — C50912 Malignant neoplasm of unspecified site of left female breast: Secondary | ICD-10-CM | POA: Diagnosis not present

## 2023-04-05 DIAGNOSIS — Z23 Encounter for immunization: Secondary | ICD-10-CM | POA: Diagnosis not present

## 2023-04-08 DIAGNOSIS — C50911 Malignant neoplasm of unspecified site of right female breast: Secondary | ICD-10-CM | POA: Diagnosis not present

## 2023-04-09 ENCOUNTER — Ambulatory Visit: Payer: BC Managed Care – PPO

## 2023-04-09 DIAGNOSIS — Z483 Aftercare following surgery for neoplasm: Secondary | ICD-10-CM | POA: Diagnosis not present

## 2023-04-09 DIAGNOSIS — Z170 Estrogen receptor positive status [ER+]: Secondary | ICD-10-CM | POA: Diagnosis not present

## 2023-04-09 DIAGNOSIS — M25611 Stiffness of right shoulder, not elsewhere classified: Secondary | ICD-10-CM

## 2023-04-09 DIAGNOSIS — C50811 Malignant neoplasm of overlapping sites of right female breast: Secondary | ICD-10-CM | POA: Diagnosis not present

## 2023-04-09 NOTE — Therapy (Signed)
OUTPATIENT PHYSICAL THERAPY  UPPER EXTREMITY ONCOLOGY EVALUATION  Patient Name: [name] MRN: [id number] DOB:January 06, 1961, 62 y.o., female Today's Date: 04/09/2023  END OF SESSION:  PT End of Session - 04/09/23 1004     Visit Number 3    Number of Visits 8    Date for PT Re-Evaluation 04/23/23    Authorization Type BCBS    Authorization - Visit Number 19    PT Start Time 1004    PT Stop Time 1056    PT Time Calculation (min) 52 min    Activity Tolerance Patient tolerated treatment well    Behavior During Therapy WFL for tasks assessed/performed             Past Medical History:  Diagnosis Date   Anxiety    Breast cancer in female [facility])    Right   Family history of breast cancer 11/07/2020   Family history of pancreatic cancer 11/07/2020   History of kidney stones 2014-2015   Hyperthyroidism    No longer an issue   PONV (postoperative nausea and vomiting)    Thyromegaly    Past Surgical History:  Procedure Laterality Date   ABDOMINAL WALL DEFECT REPAIR     following hernia repair in 2010, no mesh was used   ABDOMINOPLASTY     with hernia repair   APPENDECTOMY     BOWEL RESECTION     2008  while pregnant with last child. Scar tissue from appendectomy wrapped around bowel and was snipped to free bowel.    CESAREAN SECTION     DILATION AND CURETTAGE OF UTERUS     2000 and 2014   HERNIA REPAIR     MASTECTOMY W/ SENTINEL NODE BIOPSY Right 11/28/2020   Procedure: RIGHT MASTECTOMY WITH RIGHT AXILLARY SENTINEL LYMPH NODE BIOPSY;  Surgeon: Emelia Loron, MD;  Location: [facility] OR;  Service: General;  Laterality: Right;   PORT-A-CATH REMOVAL Left 01/30/2021   Procedure: PORT REMOVAL;  Surgeon: Emelia Loron, MD;  Location: [facility] OR;  Service: General;  Laterality: Left;  LOCAL   PORTACATH PLACEMENT Left 07/12/2020   Procedure: INSERTION PORT-A-CATH WITH ULTRASOUND GUIDANCE;  Surgeon: Emelia Loron, MD;  Location: [facility] OR;  Service: General;  Laterality:  Left;   RADIOACTIVE SEED GUIDED AXILLARY SENTINEL LYMPH NODE Right 11/28/2020   Procedure: RADIOACTIVE SEED GUIDED RIGHT AXILLARY AXILLARY SENTINEL LYMPH NODE EXCISION;  Surgeon: Emelia Loron, MD;  Location: [facility] OR;  Service: General;  Laterality: Right;   RADIOACTIVE SEED GUIDED EXCISIONAL BREAST BIOPSY Left 11/28/2020   Procedure: RADIOACTIVE SEED GUIDED LEFT EXCISIONAL BREAST BIOPSY;  Surgeon: Emelia Loron, MD;  Location: [facility] OR;  Service: General;  Laterality: Left;   ROOT CANAL     2019   TONSILLECTOMY  1969   URETERAL EXPLORATION     dilataion for congenitally small ureter   Patient Active Problem List   Diagnosis Date Noted   Chemotherapy-induced peripheral neuropathy (HCC) 10/24/2021   Vitamin D deficiency 08/30/2021   Osteoporosis 08/30/2021   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   Genetic testing 11/15/2020   Family history of breast cancer 11/07/2020   Family history of pancreatic cancer 11/07/2020   Dysphagia 10/26/2020   Malignant neoplasm of overlapping sites of right breast in female, estrogen receptor positive (HCC) 06/21/2020   Hyperthyroidism 03/28/2016    PCP: Dr. Colbert Ewing  REFERRING PROVIDER: Serena Croissant, MD  REFERRING DIAG: Right UE questionable lymphedema, pain  THERAPY DIAG:  Malignant neoplasm of overlapping sites of right breast in female, estrogen receptor  positive (HCC)  Aftercare following surgery for neoplasm  Stiffness of right shoulder, not elsewhere classified  ONSET DATE: a week ago  Rationale for Evaluation and Treatment: Rehabilitation  SUBJECTIVE:                                                                                                                                                                                           SUBJECTIVE STATEMENT:  I wore my sleeve some since I was here last. I wore it yesterday for several hours and I could feel a little carpal tunnel type feeling.  I wore th gauntlet with the sleeve  today but I got it wet and my thumb is a little numb.I felt looser after last visit. I am doing the exercises 2 times per day. The forearm above and below the elbow is where I feel the stretches the most. I still haven't got my stick yet.   PERTINENT HISTORY:  R breast cancer, left outer quadrant, triple positive, Ki 67 - 60%, grade 2 -3 IDC, neoadjuvant chemo and immuno therapy, right mastectomy on 11/28/2020 with 4 nodes removed on the right. She also had radiation ending 02/17/2021. and she had an excision of breast tissue on the left that was benign   PAIN:  Are you having pain? No, occasionally with certain movements, but it is tender to touch NPRS scale: Pain location: right forearm Pain orientation: Right  PAIN TYPE: tight and tingling, tender Pain description: intermittent  Aggravating factors: certain movements, not sure what Relieving factors: ibuprofen, sleeve   PRECAUTIONS: Right UE lymphedema risk  RED FLAGS: None   WEIGHT BEARING RESTRICTIONS: No  FALLS:  Has patient fallen in last 6 months? No  LIVING ENVIRONMENT: Lives with: lives with their familyspouse and son Lives in: House/apartment  OCCUPATION: tutoring, SAT prep  LEISURE: walking, wts,  HAND DOMINANCE: right   PRIOR LEVEL OF FUNCTION: Independent  PATIENT GOALS: Get rid of right UE discomfort, make sure its not lymphedema   OBJECTIVE: Note: Objective measures were completed at Evaluation unless otherwise noted.  COGNITION: Overall cognitive status: Within functional limits for tasks assessed   PALPATION: Palpable cord from mid upper arm across elbow  OBSERVATIONS / OTHER ASSESSMENTS:  Pt  c/o pulling/tightness from mid upper arm to forearm. In supine assessed arm; Cording noted mid upper arm extending into forearm with tightness noted with MFR techniques.   SENSATION: Light touch: Deficits    POSTURE: forward head, rounded shoulders  UPPER EXTREMITY AROM/PROM:  A/PROM RIGHT   eval    Shoulder extension 50  Shoulder flexion 153  Shoulder abduction 140 pulls in upper arm,elbow  Shoulder internal rotation 72  Shoulder external rotation 96    (Blank rows = not tested)  A/PROM LEFT   eval  Shoulder extension 53  Shoulder flexion 153  Shoulder abduction 170  Shoulder internal rotation 76  Shoulder external rotation 95    (Blank rows = not tested)  CERVICAL AROM: All within functional limits:   UPPER EXTREMITY STRENGTH:  WFL  LYMPHEDEMA ASSESSMENTS:   SURGERY TYPE/DATE: 11/28/2020 Right Mastectomy with SLNB  NUMBER OF LYMPH NODES REMOVED: 0+/4  CHEMOTHERAPY: Neoadjuvant  RADIATION:ended 02/17/2021  HORMONE TREATMENT: NO  INFECTIONS: NO   LYMPHEDEMA ASSESSMENTS:   LANDMARK RIGHT  eval  At axilla    15 cm proximal to olecranon process 27.5  10 cm proximal to olecranon process 27.2  Olecranon process 22.6  15 cm proximal to ulnar styloid process 20.3  10 cm proximal to ulnar styloid process 17.4  Just proximal to ulnar styloid process 14.6  Across hand at thumb web space 16.8  At base of 2nd digit 5.7  (Blank rows = not tested)  LANDMARK LEFT  eval  At axilla    15 cm proximal to olecranon process 29.3  10 cm proximal to olecranon process 28.9  Olecranon process 24.2  15 cm proximal to ulnar styloid process 20.4  10 cm proximal to ulnar styloid process 18.0  Just proximal to ulnar styloid process 15.0  Across hand at thumb web space 17.2  At base of 2nd digit 5.7  (Blank rows = not tested)   FUNCTIONAL TESTS:    GAIT:  normal  QUICK DASH SURVEY: 18%   TODAY'S TREATMENT:                                                                                                                                          DATE:   04/09/2023 Supine wand flexion and scaption x 3 ea MFR to right upper extremity axilla, upper arm, antecubital fossa, and forearm Long axis distraction with oscillations to relax arm between In supine: Short neck, 5  diaphragmatic breaths, L axillary nodes and establishment of interaxillary pathway, R inguinal nodes and establishment of axilloinguinal pathway, then R UE working proximal to distal, moving inner upper arm outwards and upwards, and doing both sides of forearm, then retracing all steps. Briefly eviewed NTS and HEP. 04/02/2023 Demonstrated NTS in different ranges of motion so pt would feel the stretch Wall arc x 3 for ROM Supine wand scaption x 4 MFR to right upper extremity axilla, upper arm, antecubital fossa, and forearm Long axis distraction with oscillations to relax arm between In supine: Short neck, 5 diaphragmatic breaths, L axillary nodes and establishment of interaxillary pathway, R inguinal nodes and establishment of axilloinguinal pathway, then R UE working proximal to distal, moving inner upper arm outwards and upwards, and doing both sides of forearms, spending extra time in any areas of fibrosis then retracing all  steps Printed cording information. Encouraged wearing compression sleeve, doing exs and NTS     03/26/2023    PATIENT EDUCATION:  Education details: 4 post op exercises, NTS, discussed cording, wear compression sleeve Person educated: Patient Education method: Explanation Education comprehension: verbalized understanding and returned demonstration  HOME EXERCISE PROGRAM: Post op exercises, NTS with wrist extension  ASSESSMENT:  CLINICAL IMPRESSION:  Pt with a large very tight cord starting at upper arm and running into forearn with skin dimpling noted. Pt was much looser after treatment and noted less pain into her hand/wrist. Thumb numbness from wet gauntlet was gone.  OBJECTIVE IMPAIRMENTS: decreased activity tolerance, decreased knowledge of condition, decreased ROM, increased fascial restrictions, impaired UE functional use, postural dysfunction, and pain.   ACTIVITY LIMITATIONS: reach over head  PARTICIPATION LIMITATIONS:  not restricted with most  activities except reaching  PERSONAL FACTORS: 1-2 comorbidities: Right breast cancer s/p Mastectomy, chemo and radiation  are also affecting patient's functional outcome.   REHAB POTENTIAL: Excellent  CLINICAL DECISION MAKING: Stable/uncomplicated  EVALUATION COMPLEXITY: Low  GOALS: Goals reviewed with patient? Yes  SHORT TERM GOALS= LONG TERM GOALS: Target date: 04/23/2023  Pt will be independent in HEP to improve right shoulder ROM, and strength and decrease cording Baseline: Goal status: INITIAL  2.  Pt will have no right UE pain or sensitivity Baseline:  Goal status: INITIAL  3.  Pts right shoulder ROM will be WNL without complaints of tightness in arm Baseline:  Goal status: INITIAL  4.  Pt will be progressed Through ABC strength program when cording is resolved Baseline:  Goal status: INITIAL   PLAN:  PT FREQUENCY: 2x/week  PT DURATION: 4 weeks  PLANNED INTERVENTIONS: Therapeutic exercises, Neuromuscular re-education, Patient/Family education, Self Care, Orthotic/Fit training, Manual therapy, and Re-evaluation  PLAN FOR NEXT SESSION: MFR to right UE cording, PROM, MLD update HEP prn, progress to strength when ready  Waynette Buttery, PT 04/09/2023, 10:58 AM

## 2023-04-10 ENCOUNTER — Encounter: Payer: Self-pay | Admitting: *Deleted

## 2023-04-10 ENCOUNTER — Encounter: Payer: Self-pay | Admitting: Hematology and Oncology

## 2023-04-10 NOTE — Progress Notes (Signed)
Per pt request, RN successfully faxed [employer] to [phone].

## 2023-04-11 ENCOUNTER — Ambulatory Visit: Payer: BC Managed Care – PPO

## 2023-04-11 DIAGNOSIS — Z483 Aftercare following surgery for neoplasm: Secondary | ICD-10-CM

## 2023-04-11 DIAGNOSIS — C50811 Malignant neoplasm of overlapping sites of right female breast: Secondary | ICD-10-CM | POA: Diagnosis not present

## 2023-04-11 DIAGNOSIS — Z170 Estrogen receptor positive status [ER+]: Secondary | ICD-10-CM

## 2023-04-11 DIAGNOSIS — M25611 Stiffness of right shoulder, not elsewhere classified: Secondary | ICD-10-CM | POA: Diagnosis not present

## 2023-04-11 NOTE — Therapy (Signed)
OUTPATIENT PHYSICAL THERAPY  UPPER EXTREMITY ONCOLOGY EVALUATION  Patient Name: Teresa Sheppard MRN: [id number] DOB:December 21, 1960, 62 y.o., female Today's Date: 04/11/2023  END OF SESSION:  PT End of Session - 04/11/23 0903     Visit Number 4    Number of Visits 8    Date for PT Re-Evaluation 04/23/23    Authorization Type BCBS    Authorization - Visit Number 20    PT Start Time 0904    PT Stop Time 0953    PT Time Calculation (min) 49 min    Activity Tolerance Patient tolerated treatment well    Behavior During Therapy [facility] for tasks assessed/performed             Past Medical History:  Diagnosis Date   Anxiety    Breast cancer in female [facility])    Right   Family history of breast cancer 11/07/2020   Family history of pancreatic cancer 11/07/2020   History of kidney stones 2014-2015   Hyperthyroidism    No longer an issue   PONV (postoperative nausea and vomiting)    Thyromegaly    Past Surgical History:  Procedure Laterality Date   ABDOMINAL WALL DEFECT REPAIR     following hernia repair in 2010, no mesh was used   ABDOMINOPLASTY     with hernia repair   APPENDECTOMY     BOWEL RESECTION     2008  while pregnant with last child. Scar tissue from appendectomy wrapped around bowel and was snipped to free bowel.    CESAREAN SECTION     DILATION AND CURETTAGE OF UTERUS     2000 and 2014   HERNIA REPAIR     MASTECTOMY W/ SENTINEL NODE BIOPSY Right 11/28/2020   Procedure: RIGHT MASTECTOMY WITH RIGHT AXILLARY SENTINEL LYMPH NODE BIOPSY;  Surgeon: Emelia Loron, MD;  Location: [facility] OR;  Service: General;  Laterality: Right;   PORT-A-CATH REMOVAL Left 01/30/2021   Procedure: PORT REMOVAL;  Surgeon: Emelia Loron, MD;  Location: [facility] OR;  Service: General;  Laterality: Left;  LOCAL   PORTACATH PLACEMENT Left 07/12/2020   Procedure: INSERTION PORT-A-CATH WITH ULTRASOUND GUIDANCE;  Surgeon: Emelia Loron, MD;  Location: [facility] OR;  Service: General;  Laterality:  Left;   RADIOACTIVE SEED GUIDED AXILLARY SENTINEL LYMPH NODE Right 11/28/2020   Procedure: RADIOACTIVE SEED GUIDED RIGHT AXILLARY AXILLARY SENTINEL LYMPH NODE EXCISION;  Surgeon: Emelia Loron, MD;  Location: [facility] OR;  Service: General;  Laterality: Right;   RADIOACTIVE SEED GUIDED EXCISIONAL BREAST BIOPSY Left 11/28/2020   Procedure: RADIOACTIVE SEED GUIDED LEFT EXCISIONAL BREAST BIOPSY;  Surgeon: Emelia Loron, MD;  Location: [facility] OR;  Service: General;  Laterality: Left;   ROOT CANAL     2019   TONSILLECTOMY  1969   URETERAL EXPLORATION     dilataion for congenitally small ureter   Patient Active Problem List   Diagnosis Date Noted   Chemotherapy-induced peripheral neuropathy (HCC) 10/24/2021   Vitamin D deficiency 08/30/2021   Osteoporosis 08/30/2021   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   Genetic testing 11/15/2020   Family history of breast cancer 11/07/2020   Family history of pancreatic cancer 11/07/2020   Dysphagia 10/26/2020   Malignant neoplasm of overlapping sites of right breast in female, estrogen receptor positive (HCC) 06/21/2020   Hyperthyroidism 03/28/2016    PCP: Dr. Colbert Ewing  REFERRING PROVIDER: Serena Croissant, MD  REFERRING DIAG: Right UE questionable lymphedema, pain  THERAPY DIAG:  Malignant neoplasm of overlapping sites of right breast in female, estrogen receptor  positive (HCC)  Aftercare following surgery for neoplasm  Stiffness of right shoulder, not elsewhere classified  ONSET DATE: a week ago  Rationale for Evaluation and Treatment: Rehabilitation  SUBJECTIVE:                                                                                                                                                                                           SUBJECTIVE STATEMENT:  It was better after I was here,  but its still tight. I am having trouble feeling the NTS.  PERTINENT HISTORY:  R breast cancer, left outer quadrant, triple positive, Ki  67 - 60%, grade 2 -3 IDC, neoadjuvant chemo and immuno therapy, right mastectomy on 11/28/2020 with 4 nodes removed on the right. She also had radiation ending 02/17/2021. and she had an excision of breast tissue on the left that was benign   PAIN:  Are you having pain? No, occasionally with certain movements, but it is tender to touch NPRS scale: Pain location: right forearm Pain orientation: Right  PAIN TYPE: tight and tingling, tender Pain description: intermittent  Aggravating factors: certain movements, not sure what Relieving factors: ibuprofen, sleeve   PRECAUTIONS: Right UE lymphedema risk  RED FLAGS: None   WEIGHT BEARING RESTRICTIONS: No  FALLS:  Has patient fallen in last 6 months? No  LIVING ENVIRONMENT: Lives with: lives with their familyspouse and son Lives in: House/apartment  OCCUPATION: tutoring, SAT prep  LEISURE: walking, wts,  HAND DOMINANCE: right   PRIOR LEVEL OF FUNCTION: Independent  PATIENT GOALS: Get rid of right UE discomfort, make sure its not lymphedema   OBJECTIVE: Note: Objective measures were completed at Evaluation unless otherwise noted.  COGNITION: Overall cognitive status: Within functional limits for tasks assessed   PALPATION: Palpable cord from mid upper arm across elbow  OBSERVATIONS / OTHER ASSESSMENTS:  Pt  c/o pulling/tightness from mid upper arm to forearm. In supine assessed arm; Cording noted mid upper arm extending into forearm with tightness noted with MFR techniques.   SENSATION: Light touch: Deficits    POSTURE: forward head, rounded shoulders  UPPER EXTREMITY AROM/PROM:  A/PROM RIGHT   eval   Shoulder extension 50  Shoulder flexion 153  Shoulder abduction 140 pulls in upper arm,elbow  Shoulder internal rotation 72  Shoulder external rotation 96    (Blank rows = not tested)  A/PROM LEFT   eval  Shoulder extension 53  Shoulder flexion 153  Shoulder abduction 170  Shoulder internal rotation 76  Shoulder  external rotation 95    (Blank rows = not tested)  CERVICAL AROM: All within functional limits:   UPPER EXTREMITY STRENGTH:  WFL  LYMPHEDEMA ASSESSMENTS:   SURGERY TYPE/DATE: 11/28/2020 Right Mastectomy with SLNB  NUMBER OF LYMPH NODES REMOVED: 0+/4  CHEMOTHERAPY: Neoadjuvant  RADIATION:ended 02/17/2021  HORMONE TREATMENT: NO  INFECTIONS: NO   LYMPHEDEMA ASSESSMENTS:   LANDMARK RIGHT  eval  At axilla    15 cm proximal to olecranon process 27.5  10 cm proximal to olecranon process 27.2  Olecranon process 22.6  15 cm proximal to ulnar styloid process 20.3  10 cm proximal to ulnar styloid process 17.4  Just proximal to ulnar styloid process 14.6  Across hand at thumb web space 16.8  At base of 2nd digit 5.7  (Blank rows = not tested)  LANDMARK LEFT  eval  At axilla    15 cm proximal to olecranon process 29.3  10 cm proximal to olecranon process 28.9  Olecranon process 24.2  15 cm proximal to ulnar styloid process 20.4  10 cm proximal to ulnar styloid process 18.0  Just proximal to ulnar styloid process 15.0  Across hand at thumb web space 17.2  At base of 2nd digit 5.7  (Blank rows = not tested)   FUNCTIONAL TESTS:    GAIT:  normal  QUICK DASH SURVEY: 18%   TODAY'S TREATMENT:                                                                                                                                          DATE: 04/11/2023 Wall arc to right x 4, upward reach to wall x 3, down dog x 3 on wall to stretch cords Right UE MFR to right upper extremity axilla, upper arm, antecubital fossa, and forearm to release multiple cords Long axis distraction with oscillations to relax arm between In supine: Short neck, 5 diaphragmatic breaths, L axillary nodes and establishment of interaxillary pathway, R inguinal nodes and establishment of axilloinguinal pathway, then R UE working proximal to distal, moving inner upper arm outwards and upwards, and doing both sides  of forearm and retracing all steps ending with LN's.  04/09/2023 Supine wand flexion and scaption x 3 ea MFR to right upper extremity axilla, upper arm, antecubital fossa, and forearm Long axis distraction with oscillations to relax arm between In supine: Short neck, 5 diaphragmatic breaths, L axillary nodes and establishment of interaxillary pathway, R inguinal nodes and establishment of axilloinguinal pathway, then R UE working proximal to distal, moving inner upper arm outwards and upwards, and doing both sides of forearm, then retracing all steps. Briefly eviewed NTS and HEP. 04/02/2023 Demonstrated NTS in different ranges of motion so pt would feel the stretch Wall arc x 3 for ROM Supine wand scaption x 4 MFR to right upper extremity axilla, upper arm, antecubital fossa, and forearm Long axis distraction with oscillations to relax arm between In supine: Short neck, 5 diaphragmatic breaths, L axillary nodes and establishment of interaxillary pathway, R inguinal nodes and establishment of axilloinguinal  pathway, then R UE working proximal to distal, moving inner upper arm outwards and upwards, and doing both sides of forearms, spending extra time in any areas of fibrosis then retracing all steps Printed cording information. Encouraged wearing compression sleeve, doing exs and NTS     03/26/2023    PATIENT EDUCATION:  Education details: 4 post op exercises, NTS, discussed cording, wear compression sleeve Person educated: Patient Education method: Explanation Education comprehension: verbalized understanding and returned demonstration  HOME EXERCISE PROGRAM: Post op exercises, NTS with wrist extension  ASSESSMENT:  CLINICAL IMPRESSION: Multiple cords still present with faint red line in upper arm running along cord. Several small nodules noted in cords. Most tightness noted just above elbow. Pt felt looser after rx. Reminded pt to try wearing her sleeve more often to see if it will  help. She leaves for [place] at the end of the month   OBJECTIVE IMPAIRMENTS: decreased activity tolerance, decreased knowledge of condition, decreased ROM, increased fascial restrictions, impaired UE functional use, postural dysfunction, and pain.   ACTIVITY LIMITATIONS: reach over head  PARTICIPATION LIMITATIONS:  not restricted with most activities except reaching  PERSONAL FACTORS: 1-2 comorbidities: Right breast cancer s/p Mastectomy, chemo and radiation  are also affecting patient's functional outcome.   REHAB POTENTIAL: Excellent  CLINICAL DECISION MAKING: Stable/uncomplicated  EVALUATION COMPLEXITY: Low  GOALS: Goals reviewed with patient? Yes  SHORT TERM GOALS= LONG TERM GOALS: Target date: 04/23/2023  Pt will be independent in HEP to improve right shoulder ROM, and strength and decrease cording Baseline: Goal status: INITIAL  2.  Pt will have no right UE pain or sensitivity Baseline:  Goal status: INITIAL  3.  Pts right shoulder ROM will be WNL without complaints of tightness in arm Baseline:  Goal status: INITIAL  4.  Pt will be progressed Through [facility] strength program when cording is resolved Baseline:  Goal status: INITIAL   PLAN:  PT FREQUENCY: 2x/week  PT DURATION: 4 weeks  PLANNED INTERVENTIONS: Therapeutic exercises, Neuromuscular re-education, Patient/Family education, Self Care, Orthotic/Fit training, Manual therapy, and Re-evaluation  PLAN FOR NEXT SESSION: MFR to right UE cording, PROM, MLD update HEP prn, progress to strength when ready  Waynette Buttery, PT 04/11/2023, 9:55 AM

## 2023-04-15 ENCOUNTER — Ambulatory Visit: Payer: BC Managed Care – PPO

## 2023-04-15 DIAGNOSIS — C50811 Malignant neoplasm of overlapping sites of right female breast: Secondary | ICD-10-CM | POA: Diagnosis not present

## 2023-04-15 DIAGNOSIS — M25611 Stiffness of right shoulder, not elsewhere classified: Secondary | ICD-10-CM

## 2023-04-15 DIAGNOSIS — Z483 Aftercare following surgery for neoplasm: Secondary | ICD-10-CM

## 2023-04-15 DIAGNOSIS — Z170 Estrogen receptor positive status [ER+]: Secondary | ICD-10-CM | POA: Diagnosis not present

## 2023-04-15 NOTE — Therapy (Signed)
OUTPATIENT PHYSICAL THERAPY  UPPER EXTREMITY ONCOLOGY EVALUATION  Patient Name: NIKELLE TUSS MRN: [id number] DOB:25-May-1961, 62 y.o., female Today's Date: 04/15/2023  END OF SESSION:  PT End of Session - 04/15/23 1204     Visit Number 5    Number of Visits 8    Date for PT Re-Evaluation 04/23/23    Authorization Type BCBS    Authorization - Visit Number 21    Authorization - Number of Visits 30    PT Start Time 1205    PT Stop Time 1300    PT Time Calculation (min) 55 min    Activity Tolerance Patient tolerated treatment well    Behavior During Therapy [facility] for tasks assessed/performed             Past Medical History:  Diagnosis Date   Anxiety    Breast cancer in female [facility])    Right   Family history of breast cancer 11/07/2020   Family history of pancreatic cancer 11/07/2020   History of kidney stones 2014-2015   Hyperthyroidism    No longer an issue   PONV (postoperative nausea and vomiting)    Thyromegaly    Past Surgical History:  Procedure Laterality Date   ABDOMINAL WALL DEFECT REPAIR     following hernia repair in 2010, no mesh was used   ABDOMINOPLASTY     with hernia repair   APPENDECTOMY     BOWEL RESECTION     2008  while pregnant with last child. Scar tissue from appendectomy wrapped around bowel and was snipped to free bowel.    CESAREAN SECTION     DILATION AND CURETTAGE OF UTERUS     2000 and 2014   HERNIA REPAIR     MASTECTOMY W/ SENTINEL NODE BIOPSY Right 11/28/2020   Procedure: RIGHT MASTECTOMY WITH RIGHT AXILLARY SENTINEL LYMPH NODE BIOPSY;  Surgeon: Emelia Loron, MD;  Location: [facility] OR;  Service: General;  Laterality: Right;   PORT-A-CATH REMOVAL Left 01/30/2021   Procedure: PORT REMOVAL;  Surgeon: Emelia Loron, MD;  Location: [facility] OR;  Service: General;  Laterality: Left;  LOCAL   PORTACATH PLACEMENT Left 07/12/2020   Procedure: INSERTION PORT-A-CATH WITH ULTRASOUND GUIDANCE;  Surgeon: Emelia Loron, MD;  Location: [facility]  OR;  Service: General;  Laterality: Left;   RADIOACTIVE SEED GUIDED AXILLARY SENTINEL LYMPH NODE Right 11/28/2020   Procedure: RADIOACTIVE SEED GUIDED RIGHT AXILLARY AXILLARY SENTINEL LYMPH NODE EXCISION;  Surgeon: Emelia Loron, MD;  Location: [facility] OR;  Service: General;  Laterality: Right;   RADIOACTIVE SEED GUIDED EXCISIONAL BREAST BIOPSY Left 11/28/2020   Procedure: RADIOACTIVE SEED GUIDED LEFT EXCISIONAL BREAST BIOPSY;  Surgeon: Emelia Loron, MD;  Location: [facility] OR;  Service: General;  Laterality: Left;   ROOT CANAL     2019   TONSILLECTOMY  1969   URETERAL EXPLORATION     dilataion for congenitally small ureter   Patient Active Problem List   Diagnosis Date Noted   Chemotherapy-induced peripheral neuropathy (HCC) 10/24/2021   Vitamin D deficiency 08/30/2021   Osteoporosis 08/30/2021   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   Genetic testing 11/15/2020   Family history of breast cancer 11/07/2020   Family history of pancreatic cancer 11/07/2020   Dysphagia 10/26/2020   Malignant neoplasm of overlapping sites of right breast in female, estrogen receptor positive (HCC) 06/21/2020   Hyperthyroidism 03/28/2016    PCP: Dr. Colbert Ewing  REFERRING PROVIDER: Serena Croissant, MD  REFERRING DIAG: Right UE questionable lymphedema, pain  THERAPY DIAG:  Malignant neoplasm of  overlapping sites of right breast in female, estrogen receptor positive (HCC)  Aftercare following surgery for neoplasm  Stiffness of right shoulder, not elsewhere classified  ONSET DATE: a week ago  Rationale for Evaluation and Treatment: Rehabilitation  SUBJECTIVE:                                                                                                                                                                                           SUBJECTIVE STATEMENT:  I wore the sleeve on Thursday and Friday and the redness was more pronounced.  There are 4 nodules in right arm now. I have been  massaging them like you showed me to massage scar tissue.  PERTINENT HISTORY:  R breast cancer, left outer quadrant, triple positive, Ki 67 - 60%, grade 2 -3 IDC, neoadjuvant chemo and immuno therapy, right mastectomy on 11/28/2020 with 4 nodes removed on the right. She also had radiation ending 02/17/2021. and she had an excision of breast tissue on the left that was benign   PAIN:  Are you having pain? No, occasionally with certain movements, but it is tender to touch NPRS scale: Pain location: right forearm Pain orientation: Right  PAIN TYPE: tight and tingling, tender Pain description: intermittent  Aggravating factors: certain movements, not sure what Relieving factors: ibuprofen, sleeve   PRECAUTIONS: Right UE lymphedema risk  RED FLAGS: None   WEIGHT BEARING RESTRICTIONS: No  FALLS:  Has patient fallen in last 6 months? No  LIVING ENVIRONMENT: Lives with: lives with their familyspouse and son Lives in: House/apartment  OCCUPATION: tutoring, SAT prep  LEISURE: walking, wts,  HAND DOMINANCE: right   PRIOR LEVEL OF FUNCTION: Independent  PATIENT GOALS: Get rid of right UE discomfort, make sure its not lymphedema   OBJECTIVE: Note: Objective measures were completed at Evaluation unless otherwise noted.  COGNITION: Overall cognitive status: Within functional limits for tasks assessed   PALPATION: Palpable cord from mid upper arm across elbow  OBSERVATIONS / OTHER ASSESSMENTS:  Pt  c/o pulling/tightness from mid upper arm to forearm. In supine assessed arm; Cording noted mid upper arm extending into forearm with tightness noted with MFR techniques.   04/15/2023 Redness noted at 5 separate nodules along path of cording. Improved at middle 3 nodules after treatment today  SENSATION: Light touch: Deficits    POSTURE: forward head, rounded shoulders  UPPER EXTREMITY AROM/PROM:  A/PROM RIGHT   eval   Shoulder extension 50  Shoulder flexion 153  Shoulder  abduction 140 pulls in upper arm,elbow  Shoulder internal rotation 72  Shoulder external rotation 96    (Blank rows = not tested)  A/PROM LEFT  eval  Shoulder extension 53  Shoulder flexion 153  Shoulder abduction 170  Shoulder internal rotation 76  Shoulder external rotation 95    (Blank rows = not tested)  CERVICAL AROM: All within functional limits:   UPPER EXTREMITY STRENGTH:  WFL  LYMPHEDEMA ASSESSMENTS:   SURGERY TYPE/DATE: 11/28/2020 Right Mastectomy with SLNB  NUMBER OF LYMPH NODES REMOVED: 0+/4  CHEMOTHERAPY: Neoadjuvant  RADIATION:ended 02/17/2021  HORMONE TREATMENT: NO  INFECTIONS: NO   LYMPHEDEMA ASSESSMENTS:   LANDMARK RIGHT  eval  At axilla    15 cm proximal to olecranon process 27.5  10 cm proximal to olecranon process 27.2  Olecranon process 22.6  15 cm proximal to ulnar styloid process 20.3  10 cm proximal to ulnar styloid process 17.4  Just proximal to ulnar styloid process 14.6  Across hand at thumb web space 16.8  At base of 2nd digit 5.7  (Blank rows = not tested)  LANDMARK LEFT  eval  At axilla    15 cm proximal to olecranon process 29.3  10 cm proximal to olecranon process 28.9  Olecranon process 24.2  15 cm proximal to ulnar styloid process 20.4  10 cm proximal to ulnar styloid process 18.0  Just proximal to ulnar styloid process 15.0  Across hand at thumb web space 17.2  At base of 2nd digit 5.7  (Blank rows = not tested)   FUNCTIONAL TESTS:    GAIT:  normal  QUICK DASH SURVEY: 18%   TODAY'S TREATMENT:                                                                                                                                          DATE:  04/15/2023 Pt with 1 large proximal nodule and 4 smaller nodules all tracking along the path of her cording with discreet redness looking like mosquito bite at nodular areas. Non itchy or painful Wall arc to left  Chest stretch at wall with wrist oscillation x 5 in 2  different places on wall MFR to right upper extremity axilla, upper arm, antecubital fossa, and forearm to release multiple cords Long axis distraction with oscillations to relax arm between In supine: Short neck, 5 diaphragmatic breaths, L axillary nodes and establishment of interaxillary pathway, R inguinal nodes and establishment of axilloinguinal pathway, then R UE working proximal to distal, moving inner upper arm outwards and upwards, and doing both sides of forearm and retracing all steps ending with LN's. Redness of nodules at elbow improved. Prox and distal nodule still slightly red Pt to hold on upper body wts and to stop massaging spots temporarily until we check next visit. Also advised to see MD if they become worse or painful  04/11/2023 Wall arc to right x 4, upward reach to wall x 3, down dog x 3 on wall to stretch cords Right UE MFR to right upper extremity axilla, upper arm, antecubital fossa,  and forearm to release multiple cords Long axis distraction with oscillations to relax arm between In supine: Short neck, 5 diaphragmatic breaths, L axillary nodes and establishment of interaxillary pathway, R inguinal nodes and establishment of axilloinguinal pathway, then R UE working proximal to distal, moving inner upper arm outwards and upwards, and doing both sides of forearm and retracing all steps ending with LN's.  04/09/2023 Supine wand flexion and scaption x 3 ea MFR to right upper extremity axilla, upper arm, antecubital fossa, and forearm Long axis distraction with oscillations to relax arm between In supine: Short neck, 5 diaphragmatic breaths, L axillary nodes and establishment of interaxillary pathway, R inguinal nodes and establishment of axilloinguinal pathway, then R UE working proximal to distal, moving inner upper arm outwards and upwards, and doing both sides of forearm, then retracing all steps. Briefly eviewed NTS and HEP. 04/02/2023 Demonstrated NTS in different  ranges of motion so pt would feel the stretch Wall arc x 3 for ROM Supine wand scaption x 4 MFR to right upper extremity axilla, upper arm, antecubital fossa, and forearm Long axis distraction with oscillations to relax arm between In supine: Short neck, 5 diaphragmatic breaths, L axillary nodes and establishment of interaxillary pathway, R inguinal nodes and establishment of axilloinguinal pathway, then R UE working proximal to distal, moving inner upper arm outwards and upwards, and doing both sides of forearms, spending extra time in any areas of fibrosis then retracing all steps Printed cording information. Encouraged wearing compression sleeve, doing exs and NTS     03/26/2023    PATIENT EDUCATION:  Education details: 4 post op exercises, NTS, discussed cording, wear compression sleeve Person educated: Patient Education method: Explanation Education comprehension: verbalized understanding and returned demonstration  HOME EXERCISE PROGRAM: Post op exercises, NTS with wrist extension  ASSESSMENT:  CLINICAL IMPRESSION: Pt with 5 nodules 4 rather small and 1 quite large running along cord with redness at each nodule noted. Redness did improve with treatment but remains at superior  mid upper arm nodule and inferior lower arm  nodule. Advised to see MD if they get worse or painful. Took photo with pts phone.   OBJECTIVE IMPAIRMENTS: decreased activity tolerance, decreased knowledge of condition, decreased ROM, increased fascial restrictions, impaired UE functional use, postural dysfunction, and pain.   ACTIVITY LIMITATIONS: reach over head  PARTICIPATION LIMITATIONS:  not restricted with most activities except reaching  PERSONAL FACTORS: 1-2 comorbidities: Right breast cancer s/p Mastectomy, chemo and radiation  are also affecting patient's functional outcome.   REHAB POTENTIAL: Excellent  CLINICAL DECISION MAKING: Stable/uncomplicated  EVALUATION COMPLEXITY:  Low  GOALS: Goals reviewed with patient? Yes  SHORT TERM GOALS= LONG TERM GOALS: Target date: 04/23/2023  Pt will be independent in HEP to improve right shoulder ROM, and strength and decrease cording Baseline: Goal status: INITIAL  2.  Pt will have no right UE pain or sensitivity Baseline:  Goal status: INITIAL  3.  Pts right shoulder ROM will be WNL without complaints of tightness in arm Baseline:  Goal status: INITIAL  4.  Pt will be progressed Through ABC strength program when cording is resolved Baseline:  Goal status: INITIAL   PLAN:  PT FREQUENCY: 2x/week  PT DURATION: 4 weeks  PLANNED INTERVENTIONS: Therapeutic exercises, Neuromuscular re-education, Patient/Family education, Self Care, Orthotic/Fit training, Manual therapy, and Re-evaluation  PLAN FOR NEXT SESSION: MFR to right UE cording, PROM, MLD update HEP prn, progress to strength when ready. Pt to see MD if nodules become worse.  Zella Ball  Higinio Roger, PT 04/15/2023, 1:05 PM

## 2023-04-17 ENCOUNTER — Encounter (HOSPITAL_COMMUNITY): Payer: BC Managed Care – PPO

## 2023-04-17 ENCOUNTER — Ambulatory Visit (HOSPITAL_COMMUNITY)
Admission: RE | Admit: 2023-04-17 | Discharge: 2023-04-17 | Disposition: A | Discharge status: Home / Self Care | Payer: BC Managed Care – PPO | Source: Ambulatory Visit | Attending: Physician Assistant | Admitting: Physician Assistant

## 2023-04-17 ENCOUNTER — Inpatient Hospital Stay: Payer: BC Managed Care – PPO | Attending: Physician Assistant | Admitting: Physician Assistant

## 2023-04-17 ENCOUNTER — Other Ambulatory Visit: Payer: Self-pay | Admitting: *Deleted

## 2023-04-17 ENCOUNTER — Other Ambulatory Visit: Payer: Self-pay

## 2023-04-17 VITALS — BP 117/71 | HR 54 | Temp 98.2°F | Resp 16

## 2023-04-17 DIAGNOSIS — C50811 Malignant neoplasm of overlapping sites of right female breast: Secondary | ICD-10-CM | POA: Diagnosis not present

## 2023-04-17 DIAGNOSIS — R2231 Localized swelling, mass and lump, right upper limb: Secondary | ICD-10-CM | POA: Insufficient documentation

## 2023-04-17 DIAGNOSIS — M25421 Effusion, right elbow: Secondary | ICD-10-CM | POA: Insufficient documentation

## 2023-04-17 DIAGNOSIS — Z170 Estrogen receptor positive status [ER+]: Secondary | ICD-10-CM | POA: Insufficient documentation

## 2023-04-17 DIAGNOSIS — Z79811 Long term (current) use of aromatase inhibitors: Secondary | ICD-10-CM | POA: Diagnosis not present

## 2023-04-17 NOTE — Progress Notes (Signed)
Symptom Management Consult Note [facility]    Patient Care Team: Georgann Housekeeper, MD as PCP - General (Internal Medicine) Emelia Loron, MD as Consulting Physician (General Surgery) Serena Croissant, MD as Medical Oncologist (Hematology and Oncology) Dorothy Puffer, MD as Consulting Physician (Radiation Oncology) Dorisann Frames, MD as Referring Physician (Endocrinology) Cecile Hearing, MD as Referring Physician (Endocrinology) Willis Modena, MD as Consulting Physician (Gastroenterology) Anselm Lis, RPH-CPP as Pharmacist (Hematology and Oncology)    Name / MRN / DOB: Teresa Sheppard  [id number]  [birth year]   Date of visit: 04/17/2023   Chief Complaint/Reason for visit: right arm mass   Current Therapy: Nertinib and letrozole    ASSESSMENT & PLAN: Patient is a 62 y.o. female with oncologic history of malignant neoplasm of overlapping sites of right breast in female, estrogen receptor positive followed by Dr. Pamelia Hoit.  I have viewed most recent oncology note and lab work.    #Malignant neoplasm of overlapping sites of right breast in female, estrogen receptor positive  - Next appointment with oncologist is 11/04/2023   #Right arm mass -[place] is negative for DVT -PE without wounds to RUE. RUE with faint erythema along path of cording and 3 nodules palpated.  -Reassuring exam. No indication of bacterial infection. With negative imaging suspect this is related to cording and advise patient to continue to follow with lymphedema clinic. She is agreeable with this plan.   Strict ED precautions discussed should symptoms worsen.   Heme/Onc History: Oncology History  Malignant neoplasm of overlapping sites of right breast in female, estrogen receptor positive (HCC)  06/21/2020 Initial Diagnosis   Patient palpated a right breast mass x 1 wk. Mammogram showed multiple confluent masses in the right breast at the 7 o'clock position measuring 2.3cm and at the 9  o'clock position measuring 2.2cm with one right axillary lymph node with cortical thickening. Biopsy showed invasive mammary carcinoma at both positions and in the axilla, grade 2-3, HER-2 equivocal by IHC (2+), positive by FISH (ratio 3.34), ER+ >95%, PR+ 25%, Ki67 60%.   06/29/2020 Cancer Staging   Staging form: Breast, AJCC 8th Edition - Clinical stage from 06/29/2020: Stage IB (cT2, cN1(f), cM0, G3, ER+, PR+, HER2+) - Signed by Serena Croissant, MD on 06/29/2020   07/13/2020 - 11/16/2020 Neo-Adjuvant Chemotherapy   Taxotere, Carbo, Herceptin, Perjeta x 6 followed by 1 cycle of herceptin/perjeta   10/28/2020 Breast MRI   Complete imaging response   11/14/2020 Genetic Testing   Negative hereditary cancer genetic testing: no pathogenic variants detected in [facility] BRCAPlus Panel and [facility] CancerNext-Expanded +RNAinsight Panel.  The report dates are Nov 14, 2020 and December 05, 2020, respectively.    The BRCAplus panel offered by [employer] and includes sequencing and deletion/duplication analysis for the following 8 genes: ATM, BRCA1, BRCA2, CDH1, CHEK2, PALB2, PTEN, and TP53.  The CancerNext-Expanded gene panel offered by [facility] and includes sequencing, rearrangement, and RNA analysis for the following 77 genes: AIP, ALK, APC, ATM, AXIN2, BAP1, BARD1, BLM, BMPR1A, BRCA1, BRCA2, BRIP1, CDC73, CDH1, CDK4, CDKN1B, CDKN2A, CHEK2, CTNNA1, DICER1, FANCC, FH, FLCN, GALNT12, KIF1B, LZTR1, MAX, MEN1, MET, MLH1, MSH2, MSH3, MSH6, MUTYH, NBN, NF1, NF2, NTHL1, PALB2, PHOX2B, PMS2, POT1, PRKAR1A, PTCH1, PTEN, RAD51C, RAD51D, RB1, RECQL, RET, SDHA, SDHAF2, SDHB, SDHC, SDHD, SMAD4, SMARCA4, SMARCB1, SMARCE1, STK11, SUFU, TMEM127, TP53, TSC1, TSC2, VHL and XRCC2 (sequencing and deletion/duplication); EGFR, EGLN1, HOXB13, KIT, MITF, PDGFRA, POLD1, and POLE (sequencing only); EPCAM and GREM1 (deletion/duplication only).  RADIOGRAPHIC STUDIES (from last 24 hours if applicable) I have personally reviewed the radiological images as listed and agreed with the findings in the report. VAS [place] UPPER EXTREMITY VENOUS DUPLEX  Result Date: 04/17/2023 UPPER VENOUS STUDY  Patient Name:  Teresa Sheppard  Date of Exam:   04/17/2023 Medical Rec #: [id number]        Accession #:    [id number] Date of Birth: 1961-01-12       Patient Gender: F Patient Age:   62 years Exam Location:  [facility] Procedure:      VAS [place] UPPER EXTREMITY VENOUS DUPLEX Referring Phys: Namon Cirri --------------------------------------------------------------------------------  Indications: Swelling Risk Factors: Cancer breast. Comparison Study: No prior study Performing Technologist: Shona Simpson  Examination Guidelines: A complete evaluation includes B-mode imaging, spectral Doppler, color Doppler, and power Doppler as needed of all accessible  portions of each vessel. Bilateral testing is considered an integral part of a complete examination. Limited examinations for reoccurring indications may be performed as noted.  Right Findings: +----------+------------+---------+-----------+----------+-------+ RIGHT     CompressiblePhasicitySpontaneousPropertiesSummary +----------+------------+---------+-----------+----------+-------+ IJV           Full       Yes       Yes                      +----------+------------+---------+-----------+----------+-------+ Subclavian    Full       Yes       Yes                      +----------+------------+---------+-----------+----------+-------+ Axillary      Full       Yes       Yes                      +----------+------------+---------+-----------+----------+-------+ Brachial      Full       Yes       Yes                      +----------+------------+---------+-----------+----------+-------+ Radial        Full       Yes       Yes                      +----------+------------+---------+-----------+----------+-------+ Ulnar         Full       Yes       Yes                      +----------+------------+---------+-----------+----------+-------+ Cephalic      Full       Yes       Yes                      +----------+------------+---------+-----------+----------+-------+ Basilic       Full       Yes       Yes                      +----------+------------+---------+-----------+----------+-------+  Left Findings: +----------+------------+---------+-----------+----------+-------+ LEFT      CompressiblePhasicitySpontaneousPropertiesSummary +----------+------------+---------+-----------+----------+-------+ Subclavian    Full       Yes       Yes                      +----------+------------+---------+-----------+----------+-------+  Summary:  Right: No evidence of deep vein  RADIOGRAPHIC STUDIES (from last 24 hours if applicable) I have personally reviewed the radiological images as listed and agreed with the findings in the report. VAS [place] UPPER EXTREMITY VENOUS DUPLEX  Result Date: 04/17/2023 UPPER VENOUS STUDY  Patient Name:  Teresa Sheppard  Date of Exam:   04/17/2023 Medical Rec #: [id number]        Accession #:    [id number] Date of Birth: 1961-01-12       Patient Gender: F Patient Age:   62 years Exam Location:  [facility] Procedure:      VAS [place] UPPER EXTREMITY VENOUS DUPLEX Referring Phys: Namon Cirri --------------------------------------------------------------------------------  Indications: Swelling Risk Factors: Cancer breast. Comparison Study: No prior study Performing Technologist: Shona Simpson  Examination Guidelines: A complete evaluation includes B-mode imaging, spectral Doppler, color Doppler, and power Doppler as needed of all accessible  portions of each vessel. Bilateral testing is considered an integral part of a complete examination. Limited examinations for reoccurring indications may be performed as noted.  Right Findings: +----------+------------+---------+-----------+----------+-------+ RIGHT     CompressiblePhasicitySpontaneousPropertiesSummary +----------+------------+---------+-----------+----------+-------+ IJV           Full       Yes       Yes                      +----------+------------+---------+-----------+----------+-------+ Subclavian    Full       Yes       Yes                      +----------+------------+---------+-----------+----------+-------+ Axillary      Full       Yes       Yes                      +----------+------------+---------+-----------+----------+-------+ Brachial      Full       Yes       Yes                      +----------+------------+---------+-----------+----------+-------+ Radial        Full       Yes       Yes                      +----------+------------+---------+-----------+----------+-------+ Ulnar         Full       Yes       Yes                      +----------+------------+---------+-----------+----------+-------+ Cephalic      Full       Yes       Yes                      +----------+------------+---------+-----------+----------+-------+ Basilic       Full       Yes       Yes                      +----------+------------+---------+-----------+----------+-------+  Left Findings: +----------+------------+---------+-----------+----------+-------+ LEFT      CompressiblePhasicitySpontaneousPropertiesSummary +----------+------------+---------+-----------+----------+-------+ Subclavian    Full       Yes       Yes                      +----------+------------+---------+-----------+----------+-------+  Summary:  Right: No evidence of deep vein  Symptom Management Consult Note [facility]    Patient Care Team: Georgann Housekeeper, MD as PCP - General (Internal Medicine) Emelia Loron, MD as Consulting Physician (General Surgery) Serena Croissant, MD as Medical Oncologist (Hematology and Oncology) Dorothy Puffer, MD as Consulting Physician (Radiation Oncology) Dorisann Frames, MD as Referring Physician (Endocrinology) Cecile Hearing, MD as Referring Physician (Endocrinology) Willis Modena, MD as Consulting Physician (Gastroenterology) Anselm Lis, RPH-CPP as Pharmacist (Hematology and Oncology)    Name / MRN / DOB: Teresa Sheppard  [id number]  [birth year]   Date of visit: 04/17/2023   Chief Complaint/Reason for visit: right arm mass   Current Therapy: Nertinib and letrozole    ASSESSMENT & PLAN: Patient is a 62 y.o. female with oncologic history of malignant neoplasm of overlapping sites of right breast in female, estrogen receptor positive followed by Dr. Pamelia Hoit.  I have viewed most recent oncology note and lab work.    #Malignant neoplasm of overlapping sites of right breast in female, estrogen receptor positive  - Next appointment with oncologist is 11/04/2023   #Right arm mass -[place] is negative for DVT -PE without wounds to RUE. RUE with faint erythema along path of cording and 3 nodules palpated.  -Reassuring exam. No indication of bacterial infection. With negative imaging suspect this is related to cording and advise patient to continue to follow with lymphedema clinic. She is agreeable with this plan.   Strict ED precautions discussed should symptoms worsen.   Heme/Onc History: Oncology History  Malignant neoplasm of overlapping sites of right breast in female, estrogen receptor positive (HCC)  06/21/2020 Initial Diagnosis   Patient palpated a right breast mass x 1 wk. Mammogram showed multiple confluent masses in the right breast at the 7 o'clock position measuring 2.3cm and at the 9  o'clock position measuring 2.2cm with one right axillary lymph node with cortical thickening. Biopsy showed invasive mammary carcinoma at both positions and in the axilla, grade 2-3, HER-2 equivocal by IHC (2+), positive by FISH (ratio 3.34), ER+ >95%, PR+ 25%, Ki67 60%.   06/29/2020 Cancer Staging   Staging form: Breast, AJCC 8th Edition - Clinical stage from 06/29/2020: Stage IB (cT2, cN1(f), cM0, G3, ER+, PR+, HER2+) - Signed by Serena Croissant, MD on 06/29/2020   07/13/2020 - 11/16/2020 Neo-Adjuvant Chemotherapy   Taxotere, Carbo, Herceptin, Perjeta x 6 followed by 1 cycle of herceptin/perjeta   10/28/2020 Breast MRI   Complete imaging response   11/14/2020 Genetic Testing   Negative hereditary cancer genetic testing: no pathogenic variants detected in [facility] BRCAPlus Panel and [facility] CancerNext-Expanded +RNAinsight Panel.  The report dates are Nov 14, 2020 and December 05, 2020, respectively.    The BRCAplus panel offered by [employer] and includes sequencing and deletion/duplication analysis for the following 8 genes: ATM, BRCA1, BRCA2, CDH1, CHEK2, PALB2, PTEN, and TP53.  The CancerNext-Expanded gene panel offered by [facility] and includes sequencing, rearrangement, and RNA analysis for the following 77 genes: AIP, ALK, APC, ATM, AXIN2, BAP1, BARD1, BLM, BMPR1A, BRCA1, BRCA2, BRIP1, CDC73, CDH1, CDK4, CDKN1B, CDKN2A, CHEK2, CTNNA1, DICER1, FANCC, FH, FLCN, GALNT12, KIF1B, LZTR1, MAX, MEN1, MET, MLH1, MSH2, MSH3, MSH6, MUTYH, NBN, NF1, NF2, NTHL1, PALB2, PHOX2B, PMS2, POT1, PRKAR1A, PTCH1, PTEN, RAD51C, RAD51D, RB1, RECQL, RET, SDHA, SDHAF2, SDHB, SDHC, SDHD, SMAD4, SMARCA4, SMARCB1, SMARCE1, STK11, SUFU, TMEM127, TP53, TSC1, TSC2, VHL and XRCC2 (sequencing and deletion/duplication); EGFR, EGLN1, HOXB13, KIT, MITF, PDGFRA, POLD1, and POLE (sequencing only); EPCAM and GREM1 (deletion/duplication only).  RADIOGRAPHIC STUDIES (from last 24 hours if applicable) I have personally reviewed the radiological images as listed and agreed with the findings in the report. VAS [place] UPPER EXTREMITY VENOUS DUPLEX  Result Date: 04/17/2023 UPPER VENOUS STUDY  Patient Name:  Teresa Sheppard  Date of Exam:   04/17/2023 Medical Rec #: [id number]        Accession #:    [id number] Date of Birth: 1961-01-12       Patient Gender: F Patient Age:   62 years Exam Location:  [facility] Procedure:      VAS [place] UPPER EXTREMITY VENOUS DUPLEX Referring Phys: Namon Cirri --------------------------------------------------------------------------------  Indications: Swelling Risk Factors: Cancer breast. Comparison Study: No prior study Performing Technologist: Shona Simpson  Examination Guidelines: A complete evaluation includes B-mode imaging, spectral Doppler, color Doppler, and power Doppler as needed of all accessible  portions of each vessel. Bilateral testing is considered an integral part of a complete examination. Limited examinations for reoccurring indications may be performed as noted.  Right Findings: +----------+------------+---------+-----------+----------+-------+ RIGHT     CompressiblePhasicitySpontaneousPropertiesSummary +----------+------------+---------+-----------+----------+-------+ IJV           Full       Yes       Yes                      +----------+------------+---------+-----------+----------+-------+ Subclavian    Full       Yes       Yes                      +----------+------------+---------+-----------+----------+-------+ Axillary      Full       Yes       Yes                      +----------+------------+---------+-----------+----------+-------+ Brachial      Full       Yes       Yes                      +----------+------------+---------+-----------+----------+-------+ Radial        Full       Yes       Yes                      +----------+------------+---------+-----------+----------+-------+ Ulnar         Full       Yes       Yes                      +----------+------------+---------+-----------+----------+-------+ Cephalic      Full       Yes       Yes                      +----------+------------+---------+-----------+----------+-------+ Basilic       Full       Yes       Yes                      +----------+------------+---------+-----------+----------+-------+  Left Findings: +----------+------------+---------+-----------+----------+-------+ LEFT      CompressiblePhasicitySpontaneousPropertiesSummary +----------+------------+---------+-----------+----------+-------+ Subclavian    Full       Yes       Yes                      +----------+------------+---------+-----------+----------+-------+  Summary:  Right: No evidence of deep vein  Symptom Management Consult Note [facility]    Patient Care Team: Georgann Housekeeper, MD as PCP - General (Internal Medicine) Emelia Loron, MD as Consulting Physician (General Surgery) Serena Croissant, MD as Medical Oncologist (Hematology and Oncology) Dorothy Puffer, MD as Consulting Physician (Radiation Oncology) Dorisann Frames, MD as Referring Physician (Endocrinology) Cecile Hearing, MD as Referring Physician (Endocrinology) Willis Modena, MD as Consulting Physician (Gastroenterology) Anselm Lis, RPH-CPP as Pharmacist (Hematology and Oncology)    Name / MRN / DOB: Teresa Sheppard  [id number]  [birth year]   Date of visit: 04/17/2023   Chief Complaint/Reason for visit: right arm mass   Current Therapy: Nertinib and letrozole    ASSESSMENT & PLAN: Patient is a 62 y.o. female with oncologic history of malignant neoplasm of overlapping sites of right breast in female, estrogen receptor positive followed by Dr. Pamelia Hoit.  I have viewed most recent oncology note and lab work.    #Malignant neoplasm of overlapping sites of right breast in female, estrogen receptor positive  - Next appointment with oncologist is 11/04/2023   #Right arm mass -[place] is negative for DVT -PE without wounds to RUE. RUE with faint erythema along path of cording and 3 nodules palpated.  -Reassuring exam. No indication of bacterial infection. With negative imaging suspect this is related to cording and advise patient to continue to follow with lymphedema clinic. She is agreeable with this plan.   Strict ED precautions discussed should symptoms worsen.   Heme/Onc History: Oncology History  Malignant neoplasm of overlapping sites of right breast in female, estrogen receptor positive (HCC)  06/21/2020 Initial Diagnosis   Patient palpated a right breast mass x 1 wk. Mammogram showed multiple confluent masses in the right breast at the 7 o'clock position measuring 2.3cm and at the 9  o'clock position measuring 2.2cm with one right axillary lymph node with cortical thickening. Biopsy showed invasive mammary carcinoma at both positions and in the axilla, grade 2-3, HER-2 equivocal by IHC (2+), positive by FISH (ratio 3.34), ER+ >95%, PR+ 25%, Ki67 60%.   06/29/2020 Cancer Staging   Staging form: Breast, AJCC 8th Edition - Clinical stage from 06/29/2020: Stage IB (cT2, cN1(f), cM0, G3, ER+, PR+, HER2+) - Signed by Serena Croissant, MD on 06/29/2020   07/13/2020 - 11/16/2020 Neo-Adjuvant Chemotherapy   Taxotere, Carbo, Herceptin, Perjeta x 6 followed by 1 cycle of herceptin/perjeta   10/28/2020 Breast MRI   Complete imaging response   11/14/2020 Genetic Testing   Negative hereditary cancer genetic testing: no pathogenic variants detected in [facility] BRCAPlus Panel and [facility] CancerNext-Expanded +RNAinsight Panel.  The report dates are Nov 14, 2020 and December 05, 2020, respectively.    The BRCAplus panel offered by [employer] and includes sequencing and deletion/duplication analysis for the following 8 genes: ATM, BRCA1, BRCA2, CDH1, CHEK2, PALB2, PTEN, and TP53.  The CancerNext-Expanded gene panel offered by [facility] and includes sequencing, rearrangement, and RNA analysis for the following 77 genes: AIP, ALK, APC, ATM, AXIN2, BAP1, BARD1, BLM, BMPR1A, BRCA1, BRCA2, BRIP1, CDC73, CDH1, CDK4, CDKN1B, CDKN2A, CHEK2, CTNNA1, DICER1, FANCC, FH, FLCN, GALNT12, KIF1B, LZTR1, MAX, MEN1, MET, MLH1, MSH2, MSH3, MSH6, MUTYH, NBN, NF1, NF2, NTHL1, PALB2, PHOX2B, PMS2, POT1, PRKAR1A, PTCH1, PTEN, RAD51C, RAD51D, RB1, RECQL, RET, SDHA, SDHAF2, SDHB, SDHC, SDHD, SMAD4, SMARCA4, SMARCB1, SMARCE1, STK11, SUFU, TMEM127, TP53, TSC1, TSC2, VHL and XRCC2 (sequencing and deletion/duplication); EGFR, EGLN1, HOXB13, KIT, MITF, PDGFRA, POLD1, and POLE (sequencing only); EPCAM and GREM1 (deletion/duplication only).

## 2023-04-17 NOTE — Progress Notes (Signed)
Received call from pt with complaint of right arm swelling and discoloration.  MD out of office.  Per [facility] pt needing stat VAS [place] to r/o DVT.  Orders placed, appt scheduled pt notified and verbalized understanding.

## 2023-04-18 ENCOUNTER — Ambulatory Visit: Payer: BC Managed Care – PPO

## 2023-04-18 DIAGNOSIS — L259 Unspecified contact dermatitis, unspecified cause: Secondary | ICD-10-CM | POA: Diagnosis not present

## 2023-04-18 DIAGNOSIS — M25611 Stiffness of right shoulder, not elsewhere classified: Secondary | ICD-10-CM | POA: Diagnosis not present

## 2023-04-18 DIAGNOSIS — Z170 Estrogen receptor positive status [ER+]: Secondary | ICD-10-CM

## 2023-04-18 DIAGNOSIS — Z483 Aftercare following surgery for neoplasm: Secondary | ICD-10-CM | POA: Diagnosis not present

## 2023-04-18 DIAGNOSIS — C50811 Malignant neoplasm of overlapping sites of right female breast: Secondary | ICD-10-CM | POA: Diagnosis not present

## 2023-04-18 DIAGNOSIS — M25539 Pain in unspecified wrist: Secondary | ICD-10-CM | POA: Diagnosis not present

## 2023-04-18 DIAGNOSIS — Z7189 Other specified counseling: Secondary | ICD-10-CM | POA: Diagnosis not present

## 2023-04-18 DIAGNOSIS — R599 Enlarged lymph nodes, unspecified: Secondary | ICD-10-CM | POA: Diagnosis not present

## 2023-04-18 NOTE — Therapy (Signed)
OUTPATIENT PHYSICAL THERAPY  UPPER EXTREMITY ONCOLOGY EVALUATION  Patient Name: Teresa Sheppard MRN: [id number] DOB:Dec 22, 1960, 62 y.o., female Today's Date: 04/18/2023  END OF SESSION:  PT End of Session - 04/18/23 1103     Visit Number 6    Number of Visits 8    Date for PT Re-Evaluation 04/23/23    Authorization Type [employer]    PT Start Time 1104    PT Stop Time 1150    PT Time Calculation (min) 46 min    Activity Tolerance Patient tolerated treatment well    Behavior During Therapy WFL for tasks assessed/performed             Past Medical History:  Diagnosis Date   Anxiety    Breast cancer in female [facility])    Right   Family history of breast cancer 11/07/2020   Family history of pancreatic cancer 11/07/2020   History of kidney stones 2014-2015   Hyperthyroidism    No longer an issue   PONV (postoperative nausea and vomiting)    Thyromegaly    Past Surgical History:  Procedure Laterality Date   ABDOMINAL WALL DEFECT REPAIR     following hernia repair in 2010, no mesh was used   ABDOMINOPLASTY     with hernia repair   APPENDECTOMY     BOWEL RESECTION     2008  while pregnant with last child. Scar tissue from appendectomy wrapped around bowel and was snipped to free bowel.    CESAREAN SECTION     DILATION AND CURETTAGE OF UTERUS     2000 and 2014   HERNIA REPAIR     MASTECTOMY W/ SENTINEL NODE BIOPSY Right 11/28/2020   Procedure: RIGHT MASTECTOMY WITH RIGHT AXILLARY SENTINEL LYMPH NODE BIOPSY;  Surgeon: Emelia Loron, MD;  Location: [facility] OR;  Service: General;  Laterality: Right;   PORT-A-CATH REMOVAL Left 01/30/2021   Procedure: PORT REMOVAL;  Surgeon: Emelia Loron, MD;  Location: [facility] OR;  Service: General;  Laterality: Left;  LOCAL   PORTACATH PLACEMENT Left 07/12/2020   Procedure: INSERTION PORT-A-CATH WITH ULTRASOUND GUIDANCE;  Surgeon: Emelia Loron, MD;  Location: [facility] OR;  Service: General;  Laterality: Left;   RADIOACTIVE SEED GUIDED  AXILLARY SENTINEL LYMPH NODE Right 11/28/2020   Procedure: RADIOACTIVE SEED GUIDED RIGHT AXILLARY AXILLARY SENTINEL LYMPH NODE EXCISION;  Surgeon: Emelia Loron, MD;  Location: [facility] OR;  Service: General;  Laterality: Right;   RADIOACTIVE SEED GUIDED EXCISIONAL BREAST BIOPSY Left 11/28/2020   Procedure: RADIOACTIVE SEED GUIDED LEFT EXCISIONAL BREAST BIOPSY;  Surgeon: Emelia Loron, MD;  Location: [facility] OR;  Service: General;  Laterality: Left;   ROOT CANAL     2019   TONSILLECTOMY  1969   URETERAL EXPLORATION     dilataion for congenitally small ureter   Patient Active Problem List   Diagnosis Date Noted   Chemotherapy-induced peripheral neuropathy (HCC) 10/24/2021   Vitamin D deficiency 08/30/2021   Osteoporosis 08/30/2021   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   Genetic testing 11/15/2020   Family history of breast cancer 11/07/2020   Family history of pancreatic cancer 11/07/2020   Dysphagia 10/26/2020   Malignant neoplasm of overlapping sites of right breast in female, estrogen receptor positive (HCC) 06/21/2020   Hyperthyroidism 03/28/2016    PCP: Dr. Colbert Ewing  REFERRING PROVIDER: Serena Croissant, MD  REFERRING DIAG: Right UE questionable lymphedema, pain  THERAPY DIAG:  Malignant neoplasm of overlapping sites of right breast in female, estrogen receptor positive [facility])  Aftercare following surgery for neoplasm  Stiffness of right shoulder, not elsewhere classified  ONSET DATE: a week ago  Rationale for Evaluation and Treatment: Rehabilitation  SUBJECTIVE:                                                                                                                                                                                           SUBJECTIVE STATEMENT:   I saw the Doctor and they did a doppler and there is no clot. I stretched laying down and I feel a better stretch lying down.  I avoided rolling over the area of cording just to see if it would  help.  PERTINENT HISTORY:  R breast cancer, left outer quadrant, triple positive, Ki 67 - 60%, grade 2 -3 IDC, neoadjuvant chemo and immuno therapy, right mastectomy on 11/28/2020 with 4 nodes removed on the right. She also had radiation ending 02/17/2021. and she had an excision of breast tissue on the left that was benign   PAIN:  Are you having pain? No, occasionally with certain movements, but it is tender to touch NPRS scale: Pain location: right forearm Pain orientation: Right  PAIN TYPE: tight and tingling, tender Pain description: intermittent  Aggravating factors: certain movements, not sure what Relieving factors: ibuprofen, sleeve   PRECAUTIONS: Right UE lymphedema risk  RED FLAGS: None   WEIGHT BEARING RESTRICTIONS: No  FALLS:  Has patient fallen in last 6 months? No  LIVING ENVIRONMENT: Lives with: lives with their familyspouse and son Lives in: House/apartment  OCCUPATION: tutoring, SAT prep  LEISURE: walking, wts,  HAND DOMINANCE: right   PRIOR LEVEL OF FUNCTION: Independent  PATIENT GOALS: Get rid of right UE discomfort, make sure its not lymphedema   OBJECTIVE: Note: Objective measures were completed at Evaluation unless otherwise noted.  COGNITION: Overall cognitive status: Within functional limits for tasks assessed   PALPATION: Palpable cord from mid upper arm across elbow  OBSERVATIONS / OTHER ASSESSMENTS:  Pt  c/o pulling/tightness from mid upper arm to forearm. In supine assessed arm; Cording noted mid upper arm extending into forearm with tightness noted with MFR techniques.   04/15/2023 Redness noted at 5 separate nodules along path of cording. Improved at middle 3 nodules after treatment today  SENSATION: Light touch: Deficits    POSTURE: forward head, rounded shoulders  UPPER EXTREMITY AROM/PROM:  A/PROM RIGHT   eval   Shoulder extension 50  Shoulder flexion 153  Shoulder abduction 140 pulls in upper arm,elbow  Shoulder internal  rotation 72  Shoulder external rotation 96    (Blank rows = not tested)  A/PROM LEFT   eval  Shoulder extension 53  Shoulder flexion 153  Shoulder abduction 170  Shoulder internal rotation 76  Shoulder external rotation 95    (Blank rows = not tested)  CERVICAL AROM: All within functional limits:   UPPER EXTREMITY STRENGTH:  WFL  LYMPHEDEMA ASSESSMENTS:   SURGERY TYPE/DATE: 11/28/2020 Right Mastectomy with SLNB  NUMBER OF LYMPH NODES REMOVED: 0+/4  CHEMOTHERAPY: Neoadjuvant  RADIATION:ended 02/17/2021  HORMONE TREATMENT: NO  INFECTIONS: NO   LYMPHEDEMA ASSESSMENTS:   LANDMARK RIGHT  eval  At axilla    15 cm proximal to olecranon process 27.5  10 cm proximal to olecranon process 27.2  Olecranon process 22.6  15 cm proximal to ulnar styloid process 20.3  10 cm proximal to ulnar styloid process 17.4  Just proximal to ulnar styloid process 14.6  Across hand at thumb web space 16.8  At base of 2nd digit 5.7  (Blank rows = not tested)  LANDMARK LEFT  eval  At axilla    15 cm proximal to olecranon process 29.3  10 cm proximal to olecranon process 28.9  Olecranon process 24.2  15 cm proximal to ulnar styloid process 20.4  10 cm proximal to ulnar styloid process 18.0  Just proximal to ulnar styloid process 15.0  Across hand at thumb web space 17.2  At base of 2nd digit 5.7  (Blank rows = not tested)   FUNCTIONAL TESTS:    GAIT:  normal  QUICK DASH SURVEY: 18%   TODAY'S TREATMENT:                                                                                                                                          DATE:  04/18/2023 Wall arc x 4 Chest stretch with srist oscillation x 3 Redness at nodular area improved greatly, but did increase slightly after manual therapy today MFR to right upper extremity axilla, upper arm, antecubital fossa, and forearm to release multiple cords Long axis distraction with oscillations to relax arm  between   04/15/2023 Pt with 1 large proximal nodule and 4 smaller nodules all tracking along the path of her cording with discreet redness looking like mosquito bite at nodular areas. Non itchy or painful Wall arc to left  Chest stretch at wall with wrist oscillation x 5 in 2 different places on wall MFR to right upper extremity axilla, upper arm, antecubital fossa, and forearm to release multiple cords Long axis distraction with oscillations to relax arm between In supine: Short neck, 5 diaphragmatic breaths, L axillary nodes and establishment of interaxillary pathway, R inguinal nodes and establishment of axilloinguinal pathway, then R UE working proximal to distal, moving inner upper arm outwards and upwards, and doing both sides of forearm and retracing all steps ending with LN's. Redness of nodules at elbow improved. Prox and distal nodule still slightly red Pt to hold on upper body wts and to stop massaging spots temporarily until we check next visit. Also advised to  see MD if they become worse or painful  04/11/2023 Wall arc to right x 4, upward reach to wall x 3, down dog x 3 on wall to stretch cords Right UE MFR to right upper extremity axilla, upper arm, antecubital fossa, and forearm to release multiple cords Long axis distraction with oscillations to relax arm between In supine: Short neck, 5 diaphragmatic breaths, L axillary nodes and establishment of interaxillary pathway, R inguinal nodes and establishment of axilloinguinal pathway, then R UE working proximal to distal, moving inner upper arm outwards and upwards, and doing both sides of forearm and retracing all steps ending with LN's.  04/09/2023 Supine wand flexion and scaption x 3 ea MFR to right upper extremity axilla, upper arm, antecubital fossa, and forearm Long axis distraction with oscillations to relax arm between In supine: Short neck, 5 diaphragmatic breaths, L axillary nodes and establishment of interaxillary  pathway, R inguinal nodes and establishment of axilloinguinal pathway, then R UE working proximal to distal, moving inner upper arm outwards and upwards, and doing both sides of forearm, then retracing all steps. Briefly eviewed NTS and HEP. 04/02/2023 Demonstrated NTS in different ranges of motion so pt would feel the stretch Wall arc x 3 for ROM Supine wand scaption x 4 MFR to right upper extremity axilla, upper arm, antecubital fossa, and forearm Long axis distraction with oscillations to relax arm between In supine: Short neck, 5 diaphragmatic breaths, L axillary nodes and establishment of interaxillary pathway, R inguinal nodes and establishment of axilloinguinal pathway, then R UE working proximal to distal, moving inner upper arm outwards and upwards, and doing both sides of forearms, spending extra time in any areas of fibrosis then retracing all steps Printed cording information. Encouraged wearing compression sleeve, doing exs and NTS     03/26/2023    PATIENT EDUCATION:  Education details: 4 post op exercises, NTS, discussed cording, wear compression sleeve Person educated: Patient Education method: Explanation Education comprehension: verbalized understanding and returned demonstration  HOME EXERCISE PROGRAM: Post op exercises, NTS with wrist extension  ASSESSMENT:  CLINICAL IMPRESSION: Pt had doppler at MD office to to be safe yesterday and all was normal. Redness at arm was decreased today when pt arrived, but did increase more between nodules after manual work today. Nodules felt smaller after rx and were not as red. Less tightness noted after treatment   OBJECTIVE IMPAIRMENTS: decreased activity tolerance, decreased knowledge of condition, decreased ROM, increased fascial restrictions, impaired UE functional use, postural dysfunction, and pain.   ACTIVITY LIMITATIONS: reach over head  PARTICIPATION LIMITATIONS:  not restricted with most activities except  reaching  PERSONAL FACTORS: 1-2 comorbidities: Right breast cancer s/p Mastectomy, chemo and radiation  are also affecting patient's functional outcome.   REHAB POTENTIAL: Excellent  CLINICAL DECISION MAKING: Stable/uncomplicated  EVALUATION COMPLEXITY: Low  GOALS: Goals reviewed with patient? Yes  SHORT TERM GOALS= LONG TERM GOALS: Target date: 04/23/2023  Pt will be independent in HEP to improve right shoulder ROM, and strength and decrease cording Baseline: Goal status: INITIAL  2.  Pt will have no right UE pain or sensitivity Baseline:  Goal status: INITIAL  3.  Pts right shoulder ROM will be WNL without complaints of tightness in arm Baseline:  Goal status: INITIAL  4.  Pt will be progressed Through [facility] strength program when cording is resolved Baseline:  Goal status: INITIAL   PLAN:  PT FREQUENCY: 2x/week  PT DURATION: 4 weeks  PLANNED INTERVENTIONS: Therapeutic exercises, Neuromuscular re-education, Patient/Family education, Self  Care, Orthotic/Fit training, Manual therapy, and Re-evaluation  PLAN FOR NEXT SESSION: MFR to right UE cording, PROM, MLD update HEP prn, progress to strength when ready. Pt to see MD if nodules become worse.  Waynette Buttery, PT 04/18/2023, 11:53 AM

## 2023-04-23 ENCOUNTER — Ambulatory Visit: Payer: BC Managed Care – PPO

## 2023-04-23 DIAGNOSIS — M25611 Stiffness of right shoulder, not elsewhere classified: Secondary | ICD-10-CM

## 2023-04-23 DIAGNOSIS — Z483 Aftercare following surgery for neoplasm: Secondary | ICD-10-CM

## 2023-04-23 DIAGNOSIS — C50811 Malignant neoplasm of overlapping sites of right female breast: Secondary | ICD-10-CM

## 2023-04-23 DIAGNOSIS — Z170 Estrogen receptor positive status [ER+]: Secondary | ICD-10-CM | POA: Diagnosis not present

## 2023-04-23 NOTE — Therapy (Signed)
OUTPATIENT PHYSICAL THERAPY  UPPER EXTREMITY ONCOLOGY EVALUATION  Patient Name: Teresa Sheppard MRN: [id number] DOB:1961/05/12, 62 y.o., female Today's Date: 04/23/2023  END OF SESSION:  PT End of Session - 04/23/23 1402     Visit Number 7    Number of Visits 9    Date for PT Re-Evaluation 04/30/23    Authorization Type BC[employer]    Authorization - Visit Number 21    Authorization - Number of Visits 30    PT Start Time 1404    PT Stop Time 1450    PT Time Calculation (min) 46 min    Activity Tolerance Patient tolerated treatment well    Behavior During Therapy WFL for tasks assessed/performed             Past Medical History:  Diagnosis Date   Anxiety    Breast cancer in female [facility])    Right   Family history of breast cancer 11/07/2020   Family history of pancreatic cancer 11/07/2020   History of kidney stones 2014-2015   Hyperthyroidism    No longer an issue   PONV (postoperative nausea and vomiting)    Thyromegaly    Past Surgical History:  Procedure Laterality Date   ABDOMINAL WALL DEFECT REPAIR     following hernia repair in 2010, no mesh was used   ABDOMINOPLASTY     with hernia repair   APPENDECTOMY     BOWEL RESECTION     2008  while pregnant with last child. Scar tissue from appendectomy wrapped around bowel and was snipped to free bowel.    CESAREAN SECTION     DILATION AND CURETTAGE OF UTERUS     2000 and 2014   HERNIA REPAIR     MASTECTOMY W/ SENTINEL NODE BIOPSY Right 11/28/2020   Procedure: RIGHT MASTECTOMY WITH RIGHT AXILLARY SENTINEL LYMPH NODE BIOPSY;  Surgeon: Emelia Loron, MD;  Location: [facility] OR;  Service: General;  Laterality: Right;   PORT-A-CATH REMOVAL Left 01/30/2021   Procedure: PORT REMOVAL;  Surgeon: Emelia Loron, MD;  Location[facility] OR;  Service: General;  Laterality: Left;  LOCAL   PORTACATH PLACEMENT Left 07/12/2020   Procedure: INSERTION PORT-A-CATH WITH ULTRASOUND GUIDANCE;  Surgeon: Emelia Loron, MD;  Location: [facility]  OR;  Service: General;  Laterality: Left;   RADIOACTIVE SEED GUIDED AXILLARY SENTINEL LYMPH NODE Right 11/28/2020   Procedure: RADIOACTIVE SEED GUIDED RIGHT AXILLARY AXILLARY SENTINEL LYMPH NODE EXCISION;  Surgeon: Emelia Loron, MD;  Location: [facility] OR;  Service: General;  Laterality: Right;   RADIOACTIVE SEED GUIDED EXCISIONAL BREAST BIOPSY Left 11/28/2020   Procedure: RADIOACTIVE SEED GUIDED LEFT EXCISIONAL BREAST BIOPSY;  Surgeon: Emelia Loron, MD;  Location: [facility] OR;  Service: General;  Laterality: Left;   ROOT CANAL     2019   TONSILLECTOMY  1969   URETERAL EXPLORATION     dilataion for congenitally small ureter   Patient Active Problem List   Diagnosis Date Noted   Chemotherapy-induced peripheral neuropathy (HCC) 10/24/2021   Vitamin D deficiency 08/30/2021   Osteoporosis 08/30/2021   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   Genetic testing 11/15/2020   Family history of breast cancer 11/07/2020   Family history of pancreatic cancer 11/07/2020   Dysphagia 10/26/2020   Malignant neoplasm of overlapping sites of right breast in female, estrogen receptor positive (HCC) 06/21/2020   Hyperthyroidism 03/28/2016    PCP: Dr. Colbert Ewing  REFERRING PROVIDER: Serena Croissant, MD  REFERRING DIAG: Right UE questionable lymphedema, pain  THERAPY DIAG:  Malignant neoplasm of  overlapping sites of right breast in female, estrogen receptor positive (HCC)  Aftercare following surgery for neoplasm  Stiffness of right shoulder, not elsewhere classified  ONSET DATE: a week ago  Rationale for Evaluation and Treatment: Rehabilitation  SUBJECTIVE:                                                                                                                                                                                           SUBJECTIVE STATEMENT:  I saw the MD and he gave me some medication for contact dermatitis. I leave to go out of town on November 5th.   PERTINENT HISTORY:   R breast cancer, left outer quadrant, triple positive, Ki 67 - 60%, grade 2 -3 IDC, neoadjuvant chemo and immuno therapy, right mastectomy on 11/28/2020 with 4 nodes removed on the right. She also had radiation ending 02/17/2021. and she had an excision of breast tissue on the left that was benign   PAIN:  Are you having pain? No, occasionally with certain movements, but it is tender to touch NPRS scale: Pain location: right forearm Pain orientation: Right  PAIN TYPE: tight and tingling, tender Pain description: intermittent  Aggravating factors: certain movements, not sure what Relieving factors: ibuprofen, sleeve   PRECAUTIONS: Right UE lymphedema risk  RED FLAGS: None   WEIGHT BEARING RESTRICTIONS: No  FALLS:  Has patient fallen in last 6 months? No  LIVING ENVIRONMENT: Lives with: lives with their familyspouse and son Lives in: House/apartment  OCCUPATION: tutoring, SAT prep  LEISURE: walking, wts,  HAND DOMINANCE: right   PRIOR LEVEL OF FUNCTION: Independent  PATIENT GOALS: Get rid of right UE discomfort, make sure its not lymphedema   OBJECTIVE: Note: Objective measures were completed at Evaluation unless otherwise noted.  COGNITION: Overall cognitive status: Within functional limits for tasks assessed   PALPATION: Palpable cord from mid upper arm across elbow  OBSERVATIONS / OTHER ASSESSMENTS:  Pt  c/o pulling/tightness from mid upper arm to forearm. In supine assessed arm; Cording noted mid upper arm extending into forearm with tightness noted with MFR techniques.   04/15/2023 Redness noted at 5 separate nodules along path of cording. Improved at middle 3 nodules after treatment today  SENSATION: Light touch: Deficits    POSTURE: forward head, rounded shoulders  UPPER EXTREMITY AROM/PROM:  A/PROM RIGHT   eval  RIGHT 04/23/2023  Shoulder extension 50 60  Shoulder flexion 153 160  Shoulder abduction 140 pulls in upper arm,elbow 175  Shoulder  internal rotation 72 85  Shoulder external rotation 96 103    (Blank rows = not tested)  A/PROM LEFT   eval  Shoulder  extension 53  Shoulder flexion 153  Shoulder abduction 170  Shoulder internal rotation 76  Shoulder external rotation 95    (Blank rows = not tested)  CERVICAL AROM: All within functional limits:   UPPER EXTREMITY STRENGTH:  WFL  LYMPHEDEMA ASSESSMENTS:   SURGERY TYPE/DATE: 11/28/2020 Right Mastectomy with SLNB  NUMBER OF LYMPH NODES REMOVED: 0+/4  CHEMOTHERAPY: Neoadjuvant  RADIATION:ended 02/17/2021  HORMONE TREATMENT: NO  INFECTIONS: NO   LYMPHEDEMA ASSESSMENTS:   LANDMARK RIGHT  eval  At axilla    15 cm proximal to olecranon process 27.5  10 cm proximal to olecranon process 27.2  Olecranon process 22.6  15 cm proximal to ulnar styloid process 20.3  10 cm proximal to ulnar styloid process 17.4  Just proximal to ulnar styloid process 14.6  Across hand at thumb web space 16.8  At base of 2nd digit 5.7  (Blank rows = not tested)  LANDMARK LEFT  eval  At axilla    15 cm proximal to olecranon process 29.3  10 cm proximal to olecranon process 28.9  Olecranon process 24.2  15 cm proximal to ulnar styloid process 20.4  10 cm proximal to ulnar styloid process 18.0  Just proximal to ulnar styloid process 15.0  Across hand at thumb web space 17.2  At base of 2nd digit 5.7  (Blank rows = not tested)   FUNCTIONAL TESTS:    GAIT:  normal  QUICK DASH SURVEY: 18%   TODAY'S TREATMENT:                                                                                                                                          DATE:  04/23/2023 Skin redness greatly improved although several nodular areas still present along cord MFR to right upper extremity axilla, upper arm, antecubital fossa, and forearm to release multiple cords Long axis distraction with oscillations to relax arm between Measured AROM right shoulder Checked goals for  Recert 04/18/2023 Wall arc x 4 Chest stretch with wrist oscillation x 3 Redness at nodular area improved greatly, but did increase slightly after manual therapy today MFR to right upper extremity axilla, upper arm, antecubital fossa, and forearm to release multiple cords Long axis distraction with oscillations to relax arm between   04/15/2023 Pt with 1 large proximal nodule and 4 smaller nodules all tracking along the path of her cording with discreet redness looking like mosquito bite at nodular areas. Non itchy or painful Wall arc to left  Chest stretch at wall with wrist oscillation x 5 in 2 different places on wall MFR to right upper extremity axilla, upper arm, antecubital fossa, and forearm to release multiple cords Long axis distraction with oscillations to relax arm between In supine: Short neck, 5 diaphragmatic breaths, L axillary nodes and establishment of interaxillary pathway, R inguinal nodes and establishment of axilloinguinal pathway, then R UE working proximal to distal, moving  inner upper arm outwards and upwards, and doing both sides of forearm and retracing all steps ending with LN's. Redness of nodules at elbow improved. Prox and distal nodule still slightly red Pt to hold on upper body wts and to stop massaging spots temporarily until we check next visit. Also advised to see MD if they become worse or painful  04/11/2023 Wall arc to right x 4, upward reach to wall x 3, down dog x 3 on wall to stretch cords Right UE MFR to right upper extremity axilla, upper arm, antecubital fossa, and forearm to release multiple cords Long axis distraction with oscillations to relax arm between In supine: Short neck, 5 diaphragmatic breaths, L axillary nodes and establishment of interaxillary pathway, R inguinal nodes and establishment of axilloinguinal pathway, then R UE working proximal to distal, moving inner upper arm outwards and upwards, and doing both sides of forearm and retracing  all steps ending with LN's.  04/09/2023 Supine wand flexion and scaption x 3 ea MFR to right upper extremity axilla, upper arm, antecubital fossa, and forearm Long axis distraction with oscillations to relax arm between In supine: Short neck, 5 diaphragmatic breaths, L axillary nodes and establishment of interaxillary pathway, R inguinal nodes and establishment of axilloinguinal pathway, then R UE working proximal to distal, moving inner upper arm outwards and upwards, and doing both sides of forearm, then retracing all steps. Briefly eviewed NTS and HEP. 04/02/2023 Demonstrated NTS in different ranges of motion so pt would feel the stretch Wall arc x 3 for ROM Supine wand scaption x 4 MFR to right upper extremity axilla, upper arm, antecubital fossa, and forearm Long axis distraction with oscillations to relax arm between In supine: Short neck, 5 diaphragmatic breaths, L axillary nodes and establishment of interaxillary pathway, R inguinal nodes and establishment of axilloinguinal pathway, then R UE working proximal to distal, moving inner upper arm outwards and upwards, and doing both sides of forearms, spending extra time in any areas of fibrosis then retracing all steps Printed cording information. Encouraged wearing compression sleeve, doing exs and NTS     03/26/2023    PATIENT EDUCATION:  Education details: 4 post op exercises, NTS, discussed cording, wear compression sleeve Person educated: Patient Education method: Explanation Education comprehension: verbalized understanding and returned demonstration  HOME EXERCISE PROGRAM: Post op exercises, NTS with wrist extension  ASSESSMENT:  CLINICAL IMPRESSION: Cording still present but not really interfering with ROM. Pt has achieved HEP goal and ROM goal for right shoulder. She has nearly met her pain goal. She continues with visible and palpable cording in the right UE with nodules still noted within but improved significantly  since last visit. Pt has 2 more treatments left until she leaves the country for several months. We will carry over remaining goals for last 2 visits.  OBJECTIVE IMPAIRMENTS: decreased activity tolerance, decreased knowledge of condition, decreased ROM, increased fascial restrictions, impaired UE functional use, postural dysfunction, and pain.   ACTIVITY LIMITATIONS: reach over head  PARTICIPATION LIMITATIONS:  not restricted with most activities except reaching  PERSONAL FACTORS: 1-2 comorbidities: Right breast cancer s/p Mastectomy, chemo and radiation  are also affecting patient's functional outcome.   REHAB POTENTIAL: Excellent  CLINICAL DECISION MAKING: Stable/uncomplicated  EVALUATION COMPLEXITY: Low  GOALS: Goals reviewed with patient? Yes  SHORT TERM GOALS= LONG TERM GOALS: Target date: 04/23/2023  Pt will be independent in HEP to improve right shoulder ROM, and strength and decrease cording Baseline: Goal status: MET 04/23/2023 2.  Pt  will have no right UE pain or sensitivity Baseline:  Goal status: IN progress, nearly MET  3.  Pts right shoulder ROM will be WNL without complaints of tightness in arm Baseline:  Goal status: MET 04/23/2023 4.  Pt will be progressed Through ABC strength program when cording is resolved Baseline:  Goal status:IN progress  PLAN:  PT FREQUENCY: 2x/week  PT DURATION: 4 weeks  PLANNED INTERVENTIONS: Therapeutic exercises, Neuromuscular re-education, Patient/Family education, Self Care, Orthotic/Fit training, Manual therapy, and Re-evaluation  PLAN FOR NEXT SESSION: MFR to right UE cording, PROM, MLD update HEP prn, progress to strength when ready. Pt to see MD if nodules become worse.  Waynette Buttery, PT 04/23/2023, 2:55 PM

## 2023-04-24 DIAGNOSIS — C50811 Malignant neoplasm of overlapping sites of right female breast: Secondary | ICD-10-CM | POA: Diagnosis not present

## 2023-04-24 DIAGNOSIS — Z170 Estrogen receptor positive status [ER+]: Secondary | ICD-10-CM | POA: Diagnosis not present

## 2023-04-25 ENCOUNTER — Ambulatory Visit: Payer: BC Managed Care – PPO

## 2023-04-25 DIAGNOSIS — Z483 Aftercare following surgery for neoplasm: Secondary | ICD-10-CM | POA: Diagnosis not present

## 2023-04-25 DIAGNOSIS — C50811 Malignant neoplasm of overlapping sites of right female breast: Secondary | ICD-10-CM

## 2023-04-25 DIAGNOSIS — M25611 Stiffness of right shoulder, not elsewhere classified: Secondary | ICD-10-CM | POA: Diagnosis not present

## 2023-04-25 DIAGNOSIS — Z170 Estrogen receptor positive status [ER+]: Secondary | ICD-10-CM | POA: Diagnosis not present

## 2023-04-26 DIAGNOSIS — Z1231 Encounter for screening mammogram for malignant neoplasm of breast: Secondary | ICD-10-CM | POA: Diagnosis not present

## 2023-04-30 ENCOUNTER — Encounter: Payer: Self-pay | Admitting: Hematology and Oncology

## 2023-05-02 ENCOUNTER — Encounter: Payer: Self-pay | Admitting: Hematology and Oncology

## 2023-05-07 ENCOUNTER — Encounter: Payer: Self-pay | Admitting: Hematology and Oncology

## 2023-05-15 NOTE — Telephone Encounter (Signed)
Telephone call  

## 2023-05-30 ENCOUNTER — Inpatient Hospital Stay: Payer: BC Managed Care – PPO | Attending: Physician Assistant | Admitting: Hematology and Oncology

## 2023-05-30 DIAGNOSIS — Z170 Estrogen receptor positive status [ER+]: Secondary | ICD-10-CM | POA: Diagnosis not present

## 2023-05-30 DIAGNOSIS — C50811 Malignant neoplasm of overlapping sites of right female breast: Secondary | ICD-10-CM

## 2023-05-30 NOTE — Progress Notes (Signed)
HEMATOLOGY-ONCOLOGY TELEPHONE VISIT PROGRESS NOTE  I connected with our patient on 05/30/23 at  1:30 PM EST by telephone and verified that I am speaking with the correct person using two identifiers.  I discussed the limitations, risks, security and privacy concerns of performing an evaluation and management service by telephone and the availability of in person appointments.  I also discussed with the patient that there may be a patient responsible charge related to this service. The patient expressed understanding and agreed to proceed.   History of Present Illness:  Discussed the use of AI scribe software for clinical note transcription with the patient, who gave verbal consent to proceed.  History of Present Illness            Oncology History  Malignant neoplasm of overlapping sites of right breast in female, estrogen receptor positive (HCC)  06/21/2020 Initial Diagnosis   Patient palpated a right breast mass x 1 wk. Mammogram showed multiple confluent masses in the right breast at the 7 o'clock position measuring 2.3cm and at the 9 o'clock position measuring 2.2cm with one right axillary lymph node with cortical thickening. Biopsy showed invasive mammary carcinoma at both positions and in the axilla, grade 2-3, HER-2 equivocal by IHC (2+), positive by FISH (ratio 3.34), ER+ >95%, PR+ 25%, Ki67 60%.   06/29/2020 Cancer Staging   Staging form: Breast, AJCC 8th Edition - Clinical stage from 06/29/2020: Stage IB (cT2, cN1(f), cM0, G3, ER+, PR+, HER2+) - Signed by Serena Croissant, MD on 06/29/2020   07/13/2020 - 11/16/2020 Neo-Adjuvant Chemotherapy   Taxotere, Carbo, Herceptin, Perjeta x 6 followed by 1 cycle of herceptin/perjeta   10/28/2020 Breast MRI   Complete imaging response   11/14/2020 Genetic Testing   Negative hereditary cancer genetic testing: no pathogenic variants detected in [facility] BRCAPlus Panel and [facility] CancerNext-Expanded +RNAinsight Panel.  The report dates are Nov 14, 2020 and  December 05, 2020, respectively.    The BRCAplus panel offered by [employer] and includes sequencing and deletion/duplication analysis for the following 8 genes: ATM, BRCA1, BRCA2, CDH1, CHEK2, PALB2, PTEN, and TP53.  The CancerNext-Expanded gene panel offered by [facility] and includes sequencing, rearrangement, and RNA analysis for the following 77 genes: AIP, ALK, APC, ATM, AXIN2, BAP1, BARD1, BLM, BMPR1A, BRCA1, BRCA2, BRIP1, CDC73, CDH1, CDK4, CDKN1B, CDKN2A, CHEK2, CTNNA1, DICER1, FANCC, FH, FLCN, GALNT12, KIF1B, LZTR1, MAX, MEN1, MET, MLH1, MSH2, MSH3, MSH6, MUTYH, NBN, NF1, NF2, NTHL1, PALB2, PHOX2B, PMS2, POT1, PRKAR1A, PTCH1, PTEN, RAD51C, RAD51D, RB1, RECQL, RET, SDHA, SDHAF2, SDHB, SDHC, SDHD, SMAD4, SMARCA4, SMARCB1, SMARCE1, STK11, SUFU, TMEM127, TP53, TSC1, TSC2, VHL and XRCC2 (sequencing and deletion/duplication); EGFR, EGLN1, HOXB13, KIT, MITF, PDGFRA, POLD1, and POLE (sequencing only); EPCAM and GREM1 (deletion/duplication only).    11/28/2020 Surgery   Right mastectomy Dwain Sarna): IDC, grade 3, spanning 4.0cm fibrotic area, clear margins, 1/4 right axillary lymph nodes positive for carcinoma, 0.5cm.   Left lumpectomy: no evidence of malignancy   11/28/2020 Cancer Staging   Staging form: Breast, AJCC 8th Edition - Pathologic stage from 11/28/2020: No Stage Recommended (ypT2, pN1a, cM0) - Signed by Loa Socks, NP on 07/26/2021 Stage prefix: Post-therapy   12/07/2020 - 05/23/2021 Chemotherapy   Patient is on Treatment Plan : BREAST ADO-Trastuzumab Emtansine (Kadcyla) q21d     01/04/2021 - 02/17/2021 Radiation Therapy   Site Technique Total Dose (Gy) Dose per Fx (Gy) Completed Fx Beam Energies  Chest Wall, Right: CW_Rt 3D 50.4/50.4 1.8 28/28 6X  Chest Wall, Right: CW_Rt_SCLV 3D 50.4/50.4 1.8  28/28 6X, 10X  Chest Wall, Right: CW_Rt_Bst Electron 10/10 2 5/5 6E     12/2020 -  Anti-estrogen oral therapy   Letrozole daily     REVIEW OF SYSTEMS:   Constitutional: Denies  fevers, chills or abnormal weight loss All other systems were reviewed with the patient and are negative. Observations/Objective:     Assessment Plan:  Malignant neoplasm of overlapping sites of right breast in female, estrogen receptor positive (HCC) 06/21/2020:Patient palpated a right breast mass x 1 wk. Mammogram showed multiple confluent masses in the right breast at the 7 o'clock position measuring 2.3cm and at the 9 o'clock position measuring 2.2cm with one right axillary lymph node with cortical thickening. Biopsy showed invasive mammary carcinoma at both positions and in the axilla, grade 2-3, HER-2 equivocal by IHC (2+), positive by FISH (ratio 3.34), ER+ >95%, PR+ 25%, Ki67 60%.   Treatment plan: 1. Neoadjuvant chemotherapy with TCH Perjeta 6 cycles completed 10/26/2020 followed by Herceptin Perjeta versus [name] for 1 year completed 05/23/2021 2.  Right mastectomy Dwain Sarna): IDC, grade 3, spanning 4.0cm fibrotic area, clear margins, 1/4 right axillary lymph nodes positive for carcinoma, 0.5cm. Left lumpectomy: no evidence of malignancy 3. Followed by adjuvant radiation therapy completed 02/17/2021 4.  Followed by adjuvant antiestrogen therapy started 12/29/2020 5.  Followed by neratinib (started 12/28/2021, completed August 2024) ---------------------------------------------------------------------------------------------------------------------------------- Current Treatment:letrozole   Letrozole toxicities: Tolerating it extremely well.    Surveillance:  1. Mammograms 04/26/2023 at [facility]: Benign Bone density: November 2022: T score -2.4:  2. Breast MRI 10/31/2022: No MRI evidence of malignancy    Positive Guardant Reveal: Once she comes back from [place], we will do CT CAP and Bone scan and Breast MRI  Repeat Guardant reveal and 360 testing in Jan   I discussed the assessment and treatment plan with the patient. The patient was provided an opportunity to ask questions  and all were answered. The patient agreed with the plan and demonstrated an understanding of the instructions. The patient was advised to call back or seek an in-person evaluation if the symptoms worsen or if the condition fails to improve as anticipated.   I provided 12 minutes of non-face-to-face time during this encounter.  This includes time for charting and coordination of care   Tamsen Meek, MD

## 2023-05-30 NOTE — Assessment & Plan Note (Signed)
06/21/2020:Patient palpated a right breast mass x 1 wk. Mammogram showed multiple confluent masses in the right breast at the 7 o'clock position measuring 2.3cm and at the 9 o'clock position measuring 2.2cm with one right axillary lymph node with cortical thickening. Biopsy showed invasive mammary carcinoma at both positions and in the axilla, grade 2-3, HER-2 equivocal by IHC (2+), positive by FISH (ratio 3.34), ER+ >95%, PR+ 25%, Ki67 60%.   Treatment plan: 1. Neoadjuvant chemotherapy with TCH Perjeta 6 cycles completed 10/26/2020 followed by Herceptin Perjeta versus [name] for 1 year completed 05/23/2021 2.  Right mastectomy Dwain Sarna): IDC, grade 3, spanning 4.0cm fibrotic area, clear margins, 1/4 right axillary lymph nodes positive for carcinoma, 0.5cm. Left lumpectomy: no evidence of malignancy 3. Followed by adjuvant radiation therapy completed 02/17/2021 4.  Followed by adjuvant antiestrogen therapy started 12/29/2020 5.  Followed by neratinib (started 12/28/2021, completed August 2024) ---------------------------------------------------------------------------------------------------------------------------------- Current Treatment:letrozole   Letrozole toxicities: Tolerating it extremely well.    Surveillance:  1. Mammograms 04/26/2023 at [facility]: Benign Bone density: November 2022: T score -2.4:  2. Breast MRI 10/31/2022: No MRI evidence of malignancy

## 2023-06-04 ENCOUNTER — Other Ambulatory Visit: Payer: Self-pay | Admitting: *Deleted

## 2023-06-04 MED ORDER — ANASTROZOLE 1 MG PO TABS: 1.0000 mg | ORAL_TABLET | Freq: Every day | ORAL | 1 refills | Status: DC

## 2023-06-05 ENCOUNTER — Encounter: Payer: Self-pay | Admitting: *Deleted

## 2023-06-05 ENCOUNTER — Encounter: Payer: Self-pay | Admitting: Hematology and Oncology

## 2023-06-05 MED ORDER — PREDNISONE 50 MG PO TABS: ORAL_TABLET | ORAL | 0 refills | Status: DC

## 2023-06-05 NOTE — Progress Notes (Signed)
Due to pt hx of allergic reaction to IV contrast verbal orders received from MD for pt to receive 13 hour prep with 50 mg p.o prednisone and 50 mg p.o OTC benadryl. Pt educated to take 1 tablet of p.o prednisone 13 hours prior to scan, 2nd tablet of prednisone 7 hours prior to scan and 3rd tablet of prednisone 1 hour prior to scan.  Pt also educated to take 50 mg p.o OTC benadryl 1 hours prior to the scan.  Prednisone prescription sent to pharmacy on file, pt educated and verbalized understanding.   

## 2023-06-05 NOTE — Progress Notes (Signed)
MD requesting Guardant Reveal be re ordered for 07/05/23.  Orders faxed with expected date.

## 2023-06-12 ENCOUNTER — Encounter: Payer: Self-pay | Admitting: Hematology and Oncology

## 2023-06-20 ENCOUNTER — Encounter: Payer: Self-pay | Admitting: Hematology and Oncology

## 2023-06-24 ENCOUNTER — Encounter: Payer: Self-pay | Admitting: Hematology and Oncology

## 2023-06-27 ENCOUNTER — Encounter: Payer: Self-pay | Admitting: Hematology and Oncology

## 2023-06-28 ENCOUNTER — Encounter: Payer: Self-pay | Admitting: Hematology and Oncology

## 2023-07-05 ENCOUNTER — Other Ambulatory Visit: Payer: Self-pay | Admitting: *Deleted

## 2023-07-05 ENCOUNTER — Encounter: Payer: Self-pay | Admitting: Hematology and Oncology

## 2023-07-05 MED ORDER — PREDNISONE 50 MG PO TABS: ORAL_TABLET | ORAL | 0 refills | Status: AC

## 2023-07-05 NOTE — Telephone Encounter (Signed)
 13 hour oral contrast allergy prep with Prednisone sent to pt pharmacy on file.  Pt educated and verbalized understanding.

## 2023-07-08 ENCOUNTER — Ambulatory Visit (HOSPITAL_COMMUNITY)
Admission: RE | Admit: 2023-07-08 | Discharge: 2023-07-08 | Disposition: A | Discharge status: Home / Self Care | Payer: BC Managed Care – PPO | Source: Ambulatory Visit | Attending: Hematology and Oncology | Admitting: Hematology and Oncology

## 2023-07-08 DIAGNOSIS — C50811 Malignant neoplasm of overlapping sites of right female breast: Secondary | ICD-10-CM | POA: Diagnosis not present

## 2023-07-08 DIAGNOSIS — Z170 Estrogen receptor positive status [ER+]: Secondary | ICD-10-CM | POA: Diagnosis not present

## 2023-07-08 DIAGNOSIS — N1330 Unspecified hydronephrosis: Secondary | ICD-10-CM | POA: Diagnosis not present

## 2023-07-08 DIAGNOSIS — K5710 Diverticulosis of small intestine without perforation or abscess without bleeding: Secondary | ICD-10-CM | POA: Diagnosis not present

## 2023-07-08 DIAGNOSIS — E049 Nontoxic goiter, unspecified: Secondary | ICD-10-CM | POA: Diagnosis not present

## 2023-07-08 MED ORDER — IOHEXOL 300 MG/ML  SOLN
100.0000 mL | Freq: Once | INTRAMUSCULAR | Status: AC | PRN
  Administered 2023-07-08: 100 mL via INTRAVENOUS

## 2023-07-10 ENCOUNTER — Encounter (HOSPITAL_COMMUNITY)
Admission: RE | Admit: 2023-07-10 | Discharge: 2023-07-10 | Disposition: A | Discharge status: Home / Self Care | Payer: BC Managed Care – PPO | Source: Ambulatory Visit | Attending: Hematology and Oncology | Admitting: Hematology and Oncology

## 2023-07-10 DIAGNOSIS — C50811 Malignant neoplasm of overlapping sites of right female breast: Secondary | ICD-10-CM | POA: Insufficient documentation

## 2023-07-10 DIAGNOSIS — C50919 Malignant neoplasm of unspecified site of unspecified female breast: Secondary | ICD-10-CM | POA: Diagnosis not present

## 2023-07-10 DIAGNOSIS — Z170 Estrogen receptor positive status [ER+]: Secondary | ICD-10-CM | POA: Insufficient documentation

## 2023-07-10 MED ORDER — TECHNETIUM TC 99M MEDRONATE IV KIT
20.0000 | PACK | Freq: Once | INTRAVENOUS | Status: AC
  Administered 2023-07-10: 20.5 via INTRAVENOUS

## 2023-07-16 ENCOUNTER — Ambulatory Visit
Admission: RE | Admit: 2023-07-16 | Discharge: 2023-07-16 | Disposition: A | Discharge status: Home / Self Care | Payer: BC Managed Care – PPO | Source: Ambulatory Visit | Attending: Hematology and Oncology

## 2023-07-16 DIAGNOSIS — C50811 Malignant neoplasm of overlapping sites of right female breast: Secondary | ICD-10-CM | POA: Diagnosis not present

## 2023-07-16 DIAGNOSIS — Z170 Estrogen receptor positive status [ER+]: Secondary | ICD-10-CM

## 2023-07-16 DIAGNOSIS — Z1239 Encounter for other screening for malignant neoplasm of breast: Secondary | ICD-10-CM | POA: Diagnosis not present

## 2023-07-16 MED ORDER — GADOPICLENOL 0.5 MMOL/ML IV SOLN
6.0000 mL | Freq: Once | INTRAVENOUS | Status: AC | PRN
  Administered 2023-07-16: 6 mL via INTRAVENOUS

## 2023-07-17 ENCOUNTER — Inpatient Hospital Stay: Payer: BC Managed Care – PPO | Attending: Physician Assistant | Admitting: Hematology and Oncology

## 2023-07-17 VITALS — BP 107/86 | HR 60 | Temp 97.6°F | Resp 18 | Ht 70.0 in | Wt 151.3 lb

## 2023-07-17 DIAGNOSIS — Z170 Estrogen receptor positive status [ER+]: Secondary | ICD-10-CM | POA: Insufficient documentation

## 2023-07-17 DIAGNOSIS — K760 Fatty (change of) liver, not elsewhere classified: Secondary | ICD-10-CM | POA: Insufficient documentation

## 2023-07-17 DIAGNOSIS — E041 Nontoxic single thyroid nodule: Secondary | ICD-10-CM | POA: Insufficient documentation

## 2023-07-17 DIAGNOSIS — C50811 Malignant neoplasm of overlapping sites of right female breast: Secondary | ICD-10-CM | POA: Diagnosis not present

## 2023-07-17 DIAGNOSIS — Z79811 Long term (current) use of aromatase inhibitors: Secondary | ICD-10-CM | POA: Diagnosis not present

## 2023-07-17 NOTE — Progress Notes (Signed)
Patient Care Team: Georgann Housekeeper, MD as PCP - General (Internal Medicine) Emelia Loron, MD as Consulting Physician (General Surgery) Serena Croissant, MD as Medical Oncologist (Hematology and Oncology) Dorothy Puffer, MD as Consulting Physician (Radiation Oncology) Dorisann Frames, MD as Referring Physician (Endocrinology) Cecile Hearing, MD as Referring Physician (Endocrinology) Willis Modena, MD as Consulting Physician (Gastroenterology) Anselm Lis, RPH-CPP as Pharmacist (Hematology and Oncology)  DIAGNOSIS:  Encounter Diagnosis  Name Primary?   Malignant neoplasm of overlapping sites of right breast in female, estrogen receptor positive (HCC) Yes    SUMMARY OF ONCOLOGIC HISTORY: Oncology History  Malignant neoplasm of overlapping sites of right breast in female, estrogen receptor positive (HCC)  06/21/2020 Initial Diagnosis   Patient palpated a right breast mass x 1 wk. Mammogram showed multiple confluent masses in the right breast at the 7 o'clock position measuring 2.3cm and at the 9 o'clock position measuring 2.2cm with one right axillary lymph node with cortical thickening. Biopsy showed invasive mammary carcinoma at both positions and in the axilla, grade 2-3, HER-2 equivocal by IHC (2+), positive by FISH (ratio 3.34), ER+ >95%, PR+ 25%, Ki67 60%.   06/29/2020 Cancer Staging   Staging form: Breast, AJCC 8th Edition - Clinical stage from 06/29/2020: Stage IB (cT2, cN1(f), cM0, G3, ER+, PR+, HER2+) - Signed by Serena Croissant, MD on 06/29/2020   07/13/2020 - 11/16/2020 Neo-Adjuvant Chemotherapy   Taxotere, Carbo, Herceptin, Perjeta x 6 followed by 1 cycle of herceptin/perjeta   10/28/2020 Breast MRI   Complete imaging response   11/14/2020 Genetic Testing   Negative hereditary cancer genetic testing: no pathogenic variants detected in [facility] BRCAPlus Panel and [facility] CancerNext-Expanded +RNAinsight Panel.  The report dates are Nov 14, 2020 and December 05, 2020, respectively.     The BRCAplus panel offered by [employer] and includes sequencing and deletion/duplication analysis for the following 8 genes: ATM, BRCA1, BRCA2, CDH1, CHEK2, PALB2, PTEN, and TP53.  The CancerNext-Expanded gene panel offered by [facility] and includes sequencing, rearrangement, and RNA analysis for the following 77 genes: AIP, ALK, APC, ATM, AXIN2, BAP1, BARD1, BLM, BMPR1A, BRCA1, BRCA2, BRIP1, CDC73, CDH1, CDK4, CDKN1B, CDKN2A, CHEK2, CTNNA1, DICER1, FANCC, FH, FLCN, GALNT12, KIF1B, LZTR1, MAX, MEN1, MET, MLH1, MSH2, MSH3, MSH6, MUTYH, NBN, NF1, NF2, NTHL1, PALB2, PHOX2B, PMS2, POT1, PRKAR1A, PTCH1, PTEN, RAD51C, RAD51D, RB1, RECQL, RET, SDHA, SDHAF2, SDHB, SDHC, SDHD, SMAD4, SMARCA4, SMARCB1, SMARCE1, STK11, SUFU, TMEM127, TP53, TSC1, TSC2, VHL and XRCC2 (sequencing and deletion/duplication); EGFR, EGLN1, HOXB13, KIT, MITF, PDGFRA, POLD1, and POLE (sequencing only); EPCAM and GREM1 (deletion/duplication only).    11/28/2020 Surgery   Right mastectomy Dwain Sarna): IDC, grade 3, spanning 4.0cm fibrotic area, clear margins, 1/4 right axillary lymph nodes positive for carcinoma, 0.5cm.   Left lumpectomy: no evidence of malignancy   11/28/2020 Cancer Staging   Staging form: Breast, AJCC 8th Edition - Pathologic stage from 11/28/2020: No Stage Recommended (ypT2, pN1a, cM0) - Signed by Loa Socks, NP on 07/26/2021 Stage prefix: Post-therapy   12/07/2020 - 05/23/2021 Chemotherapy   Patient is on Treatment Plan : BREAST ADO-Trastuzumab Emtansine (Kadcyla) q21d     01/04/2021 - 02/17/2021 Radiation Therapy   Site Technique Total Dose (Gy) Dose per Fx (Gy) Completed Fx Beam Energies  Chest Wall, Right: CW_Rt 3D 50.4/50.4 1.8 28/28 6X  Chest Wall, Right: CW_Rt_SCLV 3D 50.4/50.4 1.8 28/28 6X, 10X  Chest Wall, Right: CW_Rt_Bst Electron 10/10 2 5/5 6E     12/2020 -  Anti-estrogen oral therapy   Letrozole daily  CHIEF COMPLIANT: Follow-up to discuss results of scans  HISTORY OF  PRESENT ILLNESS: History of Present Illness   The patient, with a history of breast cancer, presents for a follow-up visit after returning from [place]. She has undergone several tests including MRI, CT scans, and a bone scan, all of which show no evidence of cancer. The patient reports no new symptoms or concerns related to her cancer history. She expresses concerns about balance issues, which she describes as difficulty standing on one leg and looking side to side. She also discusses a diagnosis of fatty liver and is interested in dietary modifications to address this. The patient is considering travel plans and expresses interest in participating in research trials related to intermittent fasting or flaxseed.         ALLERGIES:  is allergic to shellfish-derived products, ciprofibrate, propylthiouracil, shellfish allergy, shrimp extract, hydrocodone, and penicillins.  MEDICATIONS:  Current Outpatient Medications  Medication Sig Dispense Refill   acetaminophen (TYLENOL) 325 MG tablet Take 325 mg by mouth every 6 (six) hours as needed (headaches / pain). (Patient not taking: Reported on 12/17/2022)     anastrozole (ARIMIDEX) 1 MG tablet Take 1 tablet (1 mg total) by mouth daily. 90 tablet 1   Calcium-Vitamin D-Vitamin K ([employer] CALCIUM SOFT CHEWS) 650-12.5-40 MG-MCG-MCG CHEW Chew 1 tablet by mouth 2 (two) times daily.     cholecalciferol (VITAMIN D3) 25 MCG (1000 UNIT) tablet Take 2,000 Units by mouth daily.     D-Mannose 500 MG CAPS Take 500 mg by mouth in the morning and at bedtime.     estradiol (ESTRACE VAGINAL) 0.1 MG/GM vaginal cream Place 1 Applicatorful vaginally at bedtime. (Patient taking differently: Place 1 Applicatorful vaginally at bedtime. Per urology, 1g  two times per week) 42.5 g 12   Lactobacillus-Inulin (CULTURELLE ADULT ULT BALANCE PO) Take by mouth daily.     LUVENA VAGINAL MOISTURIZER VA Place vaginally. Once a week (Patient not taking: Reported on 12/17/2022)     Polyethyl  Glycol-Propyl Glycol (SYSTANE OP) Place 1 drop into both eyes daily as needed (dry eyes).     predniSONE (DELTASONE) 50 MG tablet Take one tablet 13 hours prior to CT, one tablet 7 hours prior to CT, one tablet 1 hour prior to CT 3 tablet 0   vitamin E 29562 units external oil Apply topically daily. Vaginally 2-3 times per week     No current facility-administered medications for this visit.    PHYSICAL EXAMINATION: ECOG PERFORMANCE STATUS: 1 - Symptomatic but completely ambulatory  Vitals:   07/17/23 1219  BP: 107/86  Pulse: 60  Resp: 18  Temp: 97.6 F (36.4 C)  SpO2: 99%   Filed Weights   07/17/23 1219  Weight: 151 lb 4.8 oz (68.6 kg)      LABORATORY DATA:  I have reviewed the data as listed    Latest Ref Rng & Units 12/17/2022   12:44 PM 11/02/2022    9:55 AM 08/21/2022   12:46 PM  CMP  Glucose 70 - 99 mg/dL 90  56  73   BUN 8 - 23 mg/dL 12  16  14    Creatinine 0.44 - 1.00 mg/dL 1.30  8.65  7.84   Sodium 135 - 145 mmol/L 137  141  139   Potassium 3.5 - 5.1 mmol/L 3.8  3.5  4.2   Chloride 98 - 111 mmol/L 102  106  102   CO2 22 - 32 mmol/L 29  28  31    Calcium  8.9 - 10.3 mg/dL 9.4  9.3  9.4   Total Protein 6.5 - 8.1 g/dL 7.0  7.6  7.9   Total Bilirubin 0.3 - 1.2 mg/dL 0.5  0.5  0.5   Alkaline Phos 38 - 126 U/L 83  90  92   AST 15 - 41 U/L 33  31  39   ALT 0 - 44 U/L 20  19  24      Lab Results  Component Value Date   WBC 4.3 12/17/2022   HGB 12.1 12/17/2022   HCT 35.9 (L) 12/17/2022   MCV 95.0 12/17/2022   PLT 174 12/17/2022   NEUTROABS 2.6 12/17/2022    ASSESSMENT & PLAN:  Malignant neoplasm of overlapping sites of right breast in female, estrogen receptor positive (HCC) 06/21/2020:Patient palpated a right breast mass x 1 wk. Mammogram showed multiple confluent masses in the right breast at the 7 o'clock position measuring 2.3cm and at the 9 o'clock position measuring 2.2cm with one right axillary lymph node with cortical thickening. Biopsy showed invasive  mammary carcinoma at both positions and in the axilla, grade 2-3, HER-2 equivocal by IHC (2+), positive by FISH (ratio 3.34), ER+ >95%, PR+ 25%, Ki67 60%.   Treatment plan: 1. Neoadjuvant chemotherapy with TCH Perjeta 6 cycles completed 10/26/2020 followed by Herceptin Perjeta versus [name] for 1 year completed 05/23/2021 2.  Right mastectomy Dwain Sarna): IDC, grade 3, spanning 4.0cm fibrotic area, clear margins, 1/4 right axillary lymph nodes positive for carcinoma, 0.5cm. Left lumpectomy: no evidence of malignancy 3. Followed by adjuvant radiation therapy completed 02/17/2021 4.  Followed by adjuvant antiestrogen therapy started 12/29/2020 5.  Followed by neratinib (started 12/28/2021, completed August 2024) ---------------------------------------------------------------------------------------------------------------------------------- Current Treatment:letrozole    Letrozole toxicities: Tolerating it extremely well.    Surveillance:  1. Mammograms 04/26/2023 at [facility]: Benign Bone density: November 2022: T score -2.4:  2. Breast MRI 10/31/2022: No MRI evidence of malignancy    Positive Guardant Reveal: Once she comes back from [place], we will do CT CAP and Bone scan and Breast MRI CT CAP and bone scan 06/06/2024: No evidence of metastatic disease.  Hepatic steatosis, mild left hydronephrosis 2.8 cm nodule left lobe of the thyroid  Repeat Guardant reveal and 360 testing in Jan     ------------------------------------- Assessment and Plan    Post Breast Cancer Surveillance No evidence of cancer recurrence on recent imaging studies (MRI, bone scan, CT). Noted incidental findings of thyroid nodule and hepatic steatosis. -Continue annual mammogram in November. -Consider MRI every 2-3 years or as needed based on symptoms or blood test results. -Continue self-breast examination monthly.  Thyroid Nodule Incidental finding on imaging. Patient reports previous follow-up with Dr.  Isabel Caprice who dismissed her as a patient due to lack of growth of the nodule. -Consider ultrasound for further evaluation.  Hepatic Steatosis Incidental finding on imaging. Discussed dietary modifications to reduce fat intake. -Reduce intake of saturated fats and sugary foods. -Continue consumption of plant-based foods and lean proteins.  Balance Issues Patient reports difficulty with balance, particularly when standing on one leg and turning head. -Consider evaluation by ENT if symptoms persist or worsen.  General Health Maintenance -Continue use of topical estrogen cream twice weekly. -Consider COVID vaccination, although it is optional. -Check blood work in six months. -Discussed diet and exercise recommendations for overall health maintenance.          No orders of the defined types were placed in this encounter.  The patient has a good understanding of the  overall plan. she agrees with it. she will call with any problems that may develop before the next visit here. Total time spent: 30 mins including face to face time and time spent for planning, charting and co-ordination of care   Tamsen Meek, MD 07/17/23

## 2023-07-17 NOTE — Assessment & Plan Note (Signed)
06/21/2020:Patient palpated a right breast mass x 1 wk. Mammogram showed multiple confluent masses in the right breast at the 7 o'clock position measuring 2.3cm and at the 9 o'clock position measuring 2.2cm with one right axillary lymph node with cortical thickening. Biopsy showed invasive mammary carcinoma at both positions and in the axilla, grade 2-3, HER-2 equivocal by IHC (2+), positive by FISH (ratio 3.34), ER+ >95%, PR+ 25%, Ki67 60%.   Treatment plan: 1. Neoadjuvant chemotherapy with TCH Perjeta 6 cycles completed 10/26/2020 followed by Herceptin Perjeta versus [name] for 1 year completed 05/23/2021 2.  Right mastectomy Dwain Sarna): IDC, grade 3, spanning 4.0cm fibrotic area, clear margins, 1/4 right axillary lymph nodes positive for carcinoma, 0.5cm. Left lumpectomy: no evidence of malignancy 3. Followed by adjuvant radiation therapy completed 02/17/2021 4.  Followed by adjuvant antiestrogen therapy started 12/29/2020 5.  Followed by neratinib (started 12/28/2021, completed August 2024) ---------------------------------------------------------------------------------------------------------------------------------- Current Treatment:letrozole    Letrozole toxicities: Tolerating it extremely well.    Surveillance:  1. Mammograms 04/26/2023 at [facility]: Benign Bone density: November 2022: T score -2.4:  2. Breast MRI 10/31/2022: No MRI evidence of malignancy    Positive Guardant Reveal: Once she comes back from [place], we will do CT CAP and Bone scan and Breast MRI CT CAP and bone scan 06/06/2024: No evidence of metastatic disease.  Hepatic steatosis, mild left hydronephrosis 2.8 cm nodule left lobe of the thyroid  Repeat Guardant reveal and 360 testing in Jan

## 2023-07-19 ENCOUNTER — Ambulatory Visit: Payer: BC Managed Care – PPO | Admitting: Hematology and Oncology

## 2023-07-22 ENCOUNTER — Encounter: Payer: Self-pay | Admitting: Hematology and Oncology

## 2023-07-23 ENCOUNTER — Ambulatory Visit: Payer: BC Managed Care – PPO | Admitting: Hematology and Oncology

## 2023-07-26 DIAGNOSIS — K760 Fatty (change of) liver, not elsewhere classified: Secondary | ICD-10-CM | POA: Diagnosis not present

## 2023-07-26 DIAGNOSIS — E041 Nontoxic single thyroid nodule: Secondary | ICD-10-CM | POA: Diagnosis not present

## 2023-08-20 DIAGNOSIS — M25569 Pain in unspecified knee: Secondary | ICD-10-CM | POA: Diagnosis not present

## 2023-08-20 DIAGNOSIS — M25559 Pain in unspecified hip: Secondary | ICD-10-CM | POA: Diagnosis not present

## 2023-08-20 DIAGNOSIS — C50919 Malignant neoplasm of unspecified site of unspecified female breast: Secondary | ICD-10-CM | POA: Diagnosis not present

## 2023-08-21 DIAGNOSIS — M25559 Pain in unspecified hip: Secondary | ICD-10-CM | POA: Diagnosis not present

## 2023-08-22 ENCOUNTER — Encounter: Payer: Self-pay | Admitting: Hematology and Oncology

## 2023-08-22 ENCOUNTER — Other Ambulatory Visit: Payer: Self-pay | Admitting: *Deleted

## 2023-08-22 MED ORDER — ANASTROZOLE 1 MG PO TABS: 1.0000 mg | ORAL_TABLET | Freq: Every day | ORAL | 1 refills | Status: DC

## 2023-08-31 ENCOUNTER — Other Ambulatory Visit: Payer: Self-pay | Admitting: Hematology and Oncology

## 2023-09-06 DIAGNOSIS — M25551 Pain in right hip: Secondary | ICD-10-CM | POA: Diagnosis not present

## 2023-09-09 DIAGNOSIS — M25551 Pain in right hip: Secondary | ICD-10-CM | POA: Diagnosis not present

## 2023-09-16 DIAGNOSIS — M25551 Pain in right hip: Secondary | ICD-10-CM | POA: Diagnosis not present

## 2023-09-17 DIAGNOSIS — R300 Dysuria: Secondary | ICD-10-CM | POA: Diagnosis not present

## 2023-09-17 DIAGNOSIS — R350 Frequency of micturition: Secondary | ICD-10-CM | POA: Diagnosis not present

## 2023-09-17 DIAGNOSIS — N130 Hydronephrosis with ureteropelvic junction obstruction: Secondary | ICD-10-CM | POA: Diagnosis not present

## 2023-10-19 ENCOUNTER — Encounter: Payer: Self-pay | Admitting: Hematology and Oncology

## 2023-10-21 ENCOUNTER — Encounter: Payer: Self-pay | Admitting: Hematology and Oncology

## 2023-10-23 DIAGNOSIS — M25551 Pain in right hip: Secondary | ICD-10-CM | POA: Diagnosis not present

## 2023-10-28 DIAGNOSIS — M25551 Pain in right hip: Secondary | ICD-10-CM | POA: Diagnosis not present

## 2023-10-30 ENCOUNTER — Other Ambulatory Visit: Payer: Self-pay | Admitting: *Deleted

## 2023-10-30 DIAGNOSIS — C50811 Malignant neoplasm of overlapping sites of right female breast: Secondary | ICD-10-CM

## 2023-10-31 ENCOUNTER — Ambulatory Visit: Admitting: Hematology and Oncology

## 2023-10-31 ENCOUNTER — Inpatient Hospital Stay: Attending: Hematology and Oncology

## 2023-10-31 ENCOUNTER — Inpatient Hospital Stay (HOSPITAL_BASED_OUTPATIENT_CLINIC_OR_DEPARTMENT_OTHER): Admitting: Hematology and Oncology

## 2023-10-31 ENCOUNTER — Other Ambulatory Visit

## 2023-10-31 VITALS — BP 98/60 | HR 52 | Temp 98.2°F | Resp 18 | Ht 70.0 in | Wt 151.3 lb

## 2023-10-31 DIAGNOSIS — Z170 Estrogen receptor positive status [ER+]: Secondary | ICD-10-CM

## 2023-10-31 DIAGNOSIS — Z923 Personal history of irradiation: Secondary | ICD-10-CM | POA: Diagnosis not present

## 2023-10-31 DIAGNOSIS — C50811 Malignant neoplasm of overlapping sites of right female breast: Secondary | ICD-10-CM

## 2023-10-31 DIAGNOSIS — Z1731 Human epidermal growth factor receptor 2 positive status: Secondary | ICD-10-CM | POA: Insufficient documentation

## 2023-10-31 DIAGNOSIS — Z9011 Acquired absence of right breast and nipple: Secondary | ICD-10-CM | POA: Insufficient documentation

## 2023-10-31 DIAGNOSIS — Z79811 Long term (current) use of aromatase inhibitors: Secondary | ICD-10-CM | POA: Diagnosis not present

## 2023-10-31 DIAGNOSIS — Z9221 Personal history of antineoplastic chemotherapy: Secondary | ICD-10-CM | POA: Insufficient documentation

## 2023-10-31 DIAGNOSIS — Z1721 Progesterone receptor positive status: Secondary | ICD-10-CM | POA: Insufficient documentation

## 2023-10-31 LAB — CMP (CANCER CENTER ONLY)
ALT: 16 U/L (ref 0–44)
AST: 25 U/L (ref 15–41)
Albumin: 4.4 g/dL (ref 3.5–5.0)
Alkaline Phosphatase: 55 U/L (ref 38–126)
Anion gap: 4 — ABNORMAL LOW (ref 5–15)
BUN: 21 mg/dL (ref 8–23)
CO2: 30 mmol/L (ref 22–32)
Calcium: 9.2 mg/dL (ref 8.9–10.3)
Chloride: 106 mmol/L (ref 98–111)
Creatinine: 0.82 mg/dL (ref 0.44–1.00)
GFR, Estimated: >60 mL/min (ref >60)
Glucose, Bld: 78 mg/dL (ref 70–99)
Potassium: 4.3 mmol/L (ref 3.5–5.1)
Sodium: 140 mmol/L (ref 135–145)
Total Bilirubin: 0.4 mg/dL (ref 0.0–1.2)
Total Protein: 7.4 g/dL (ref 6.5–8.1)

## 2023-10-31 LAB — CBC WITH DIFFERENTIAL (CANCER CENTER ONLY)
Abs Immature Granulocytes: 0.01 10*3/uL (ref 0.00–0.07)
Basophils Absolute: 0 10*3/uL (ref 0.0–0.1)
Basophils Relative: 1 %
Eosinophils Absolute: 0.1 10*3/uL (ref 0.0–0.5)
Eosinophils Relative: 2 %
HCT: 38 % (ref 36.0–46.0)
Hemoglobin: 13 g/dL (ref 12.0–15.0)
Immature Granulocytes: 0 %
Lymphocytes Relative: 29 %
Lymphs Abs: 1 10*3/uL (ref 0.7–4.0)
MCH: 31.7 pg (ref 26.0–34.0)
MCHC: 34.2 g/dL (ref 30.0–36.0)
MCV: 92.7 fL (ref 80.0–100.0)
Monocytes Absolute: 0.4 10*3/uL (ref 0.1–1.0)
Monocytes Relative: 11 %
Neutro Abs: 1.8 10*3/uL (ref 1.7–7.7)
Neutrophils Relative %: 57 %
Platelet Count: 161 10*3/uL (ref 150–400)
RBC: 4.1 MIL/uL (ref 3.87–5.11)
RDW: 11.7 % (ref 11.5–15.5)
WBC Count: 3.3 10*3/uL — ABNORMAL LOW (ref 4.0–10.5)
nRBC: 0 % (ref 0.0–0.2)

## 2023-10-31 NOTE — Assessment & Plan Note (Signed)
 06/21/2020:Patient palpated a right breast mass x 1 wk. Mammogram showed multiple confluent masses in the right breast at the 7 o'clock position measuring 2.3cm and at the 9 o'clock position measuring 2.2cm with one right axillary lymph node with cortical thickening. Biopsy showed invasive mammary carcinoma at both positions and in the axilla, grade 2-3, HER-2 equivocal by IHC (2+), positive by FISH (ratio 3.34), ER+ >95%, PR+ 25%, Ki67 60%.   Treatment plan: 1. Neoadjuvant chemotherapy with TCH Perjeta  6 cycles completed 10/26/2020 followed by Herceptin  Perjeta  versus Kadcyla  maintenance for 1 year completed 05/23/2021 2.  Right mastectomy Delane Fear): IDC, grade 3, spanning 4.0cm fibrotic area, clear margins, 1/4 right axillary lymph nodes positive for carcinoma, 0.5cm. Left lumpectomy: no evidence of malignancy 3. Followed by adjuvant radiation therapy completed 02/17/2021 4.  Followed by adjuvant antiestrogen therapy started 12/29/2020 5.  Followed by neratinib  (started 12/28/2021, completed August 2024) ---------------------------------------------------------------------------------------------------------------------------------- Current Treatment:letrozole     Letrozole  toxicities: Tolerating it extremely well.    Surveillance:  1. Mammograms 04/26/2023 at [facility]: Benign Bone density: November 2022: T score -2.4:  2. Breast MRI 07/17/2023: No MRI evidence of malignancy  3.  Bone scan 07/16/2023: Negative 4.  CT CAP 07/08/2023: No evidence of metastatic disease   Positive Guardant Reveal: 04/24/2023: 0.09%   Repeat Guardant reveal and 360 testing in Jan

## 2023-10-31 NOTE — Progress Notes (Signed)
 Patient Care Team: Jearldine Mina, MD as PCP - General (Internal Medicine) Enid Harry, MD as Consulting Physician (General Surgery) Cameron Cea, MD as Medical Oncologist (Hematology and Oncology) Johna Myers, MD as Consulting Physician (Radiation Oncology) Tasia Farr, MD as Referring Physician (Endocrinology) Amedeo Jupiter, MD as Referring Physician (Endocrinology) Evangeline Hilts, MD as Consulting Physician (Gastroenterology) Althea Atkinson, RPH-CPP as Pharmacist (Hematology and Oncology)  DIAGNOSIS:  Encounter Diagnosis  Name Primary?   Malignant neoplasm of overlapping sites of right breast in female, estrogen receptor positive (HCC) Yes    SUMMARY OF ONCOLOGIC HISTORY: Oncology History  Malignant neoplasm of overlapping sites of right breast in female, estrogen receptor positive (HCC)  06/21/2020 Initial Diagnosis   Patient palpated a right breast mass x 1 wk. Mammogram showed multiple confluent masses in the right breast at the 7 o'clock position measuring 2.3cm and at the 9 o'clock position measuring 2.2cm with one right axillary lymph node with cortical thickening. Biopsy showed invasive mammary carcinoma at both positions and in the axilla, grade 2-3, HER-2 equivocal by IHC (2+), positive by FISH (ratio 3.34), ER+ >95%, PR+ 25%, Ki67 60%.   06/29/2020 Cancer Staging   Staging form: Breast, AJCC 8th Edition - Clinical stage from 06/29/2020: Stage IB (cT2, cN1(f), cM0, G3, ER+, PR+, HER2+) - Signed by Cameron Cea, MD on 06/29/2020   07/13/2020 - 11/16/2020 Neo-Adjuvant Chemotherapy   Taxotere , Carbo, Herceptin , Perjeta  x 6 followed by 1 cycle of herceptin /perjeta    10/28/2020 Breast MRI   Complete imaging response   11/14/2020 Genetic Testing   Negative hereditary cancer genetic testing: no pathogenic variants detected in [facility] BRCAPlus Panel and [facility] CancerNext-Expanded +RNAinsight Panel.  The report dates are Nov 14, 2020 and December 05, 2020, respectively.     The BRCAplus panel offered by [employer] and includes sequencing and deletion/duplication analysis for the following 8 genes: ATM, BRCA1, BRCA2, CDH1, CHEK2, PALB2, PTEN, and TP53.  The CancerNext-Expanded gene panel offered by [facility] and includes sequencing, rearrangement, and RNA analysis for the following 77 genes: AIP, ALK, APC, ATM, AXIN2, BAP1, BARD1, BLM, BMPR1A, BRCA1, BRCA2, BRIP1, CDC73, CDH1, CDK4, CDKN1B, CDKN2A, CHEK2, CTNNA1, DICER1, FANCC, FH, FLCN, GALNT12, KIF1B, LZTR1, MAX, MEN1, MET, MLH1, MSH2, MSH3, MSH6, MUTYH, NBN, NF1, NF2, NTHL1, PALB2, PHOX2B, PMS2, POT1, PRKAR1A, PTCH1, PTEN, RAD51C, RAD51D, RB1, RECQL, RET, SDHA, SDHAF2, SDHB, SDHC, SDHD, SMAD4, SMARCA4, SMARCB1, SMARCE1, STK11, SUFU, TMEM127, TP53, TSC1, TSC2, VHL and XRCC2 (sequencing and deletion/duplication); EGFR, EGLN1, HOXB13, KIT, MITF, PDGFRA, POLD1, and POLE (sequencing only); EPCAM and GREM1 (deletion/duplication only).    11/28/2020 Surgery   Right mastectomy Delane Fear): IDC, grade 3, spanning 4.0cm fibrotic area, clear margins, 1/4 right axillary lymph nodes positive for carcinoma, 0.5cm.   Left lumpectomy: no evidence of malignancy   11/28/2020 Cancer Staging   Staging form: Breast, AJCC 8th Edition - Pathologic stage from 11/28/2020: No Stage Recommended (ypT2, pN1a, cM0) - Signed by Percival Brace, NP on 07/26/2021 Stage prefix: Post-therapy   12/07/2020 - 05/23/2021 Chemotherapy   Patient is on Treatment Plan : BREAST ADO-Trastuzumab Emtansine  (Kadcyla ) q21d     01/04/2021 - 02/17/2021 Radiation Therapy   Site Technique Total Dose (Gy) Dose per Fx (Gy) Completed Fx Beam Energies  Chest Wall, Right: CW_Rt 3D 50.4/50.4 1.8 28/28 6X  Chest Wall, Right: CW_Rt_SCLV 3D 50.4/50.4 1.8 28/28 6X, 10X  Chest Wall, Right: CW_Rt_Bst Electron 10/10 2 5/5 6E     12/2020 -  Anti-estrogen oral therapy   Letrozole  daily  CHIEF COMPLIANT: Follow-up on letrozole  therapy  HISTORY OF PRESENT  ILLNESS:   History of Present Illness Teresa Sheppard is a 63 year old female who presents for follow-up regarding urinary symptoms    She experiences nocturia and increased urinary frequency, urinating more than four times a day, which is concerning her due to her potential impact on her upcoming travel plans. She has a history of hydronephrosis and underwent surgery for this condition in the past. A recent CT scan was performed in January to assess the condition further. She is considering alternative imaging like an ultrasound due to concerns about radiation exposure and her allergy to contrast material, which requires premedication with prednisone  and Benadryl .  Her recent blood work showed a low white cell count but normal neutrophil levels, hemoglobin, and platelet count. She attributes the low white cell count to prior treatment. Further kidney function tests are scheduled for next week.     ALLERGIES:  is allergic to shellfish-derived products, ciprofibrate, propylthiouracil, shellfish allergy, shrimp extract, hydrocodone, and penicillins.  MEDICATIONS:  Current Outpatient Medications  Medication Sig Dispense Refill   Calcium-Vitamin D -Vitamin K ([employer] CALCIUM SOFT CHEWS) 650-12.5-40 MG-MCG-MCG CHEW Chew 1 tablet by mouth 2 (two) times daily.     cholecalciferol (VITAMIN D3) 25 MCG (1000 UNIT) tablet Take 2,000 Units by mouth daily.     D-Mannose 500 MG CAPS Take 500 mg by mouth in the morning and at bedtime.     estradiol  (ESTRACE ) 0.1 MG/GM vaginal cream INSERT 1 APPLICATORFUL VAGINALLY AT BEDTIME 42.5 g 12   Lactobacillus-Inulin (CULTURELLE ADULT ULT BALANCE PO) Take by mouth daily.     Polyethyl Glycol-Propyl Glycol (SYSTANE OP) Place 1 drop into both eyes daily as needed (dry eyes).     vitamin E 28000 units external oil Apply topically daily. Vaginally 2-3 times per week     acetaminophen  (TYLENOL ) 325 MG tablet Take 325 mg by mouth every 6 (six) hours as needed (headaches / pain).  (Patient not taking: Reported on 12/17/2022)     anastrozole  (ARIMIDEX ) 1 MG tablet Take 1 tablet (1 mg total) by mouth daily. (Patient not taking: Reported on 10/31/2023) 90 tablet 1   LUVENA VAGINAL MOISTURIZER VA Place vaginally. Once a week (Patient not taking: Reported on 12/17/2022)     predniSONE  (DELTASONE ) 50 MG tablet Take one tablet 13 hours prior to CT, one tablet 7 hours prior to CT, one tablet 1 hour prior to CT (Patient not taking: Reported on 10/31/2023) 3 tablet 0   No current facility-administered medications for this visit.    PHYSICAL EXAMINATION: ECOG PERFORMANCE STATUS: 1 - Symptomatic but completely ambulatory  Vitals:   10/31/23 1353  BP: 98/60  Pulse: (!) 52  Resp: 18  Temp: 98.2 F (36.8 C)  SpO2: 100%   Filed Weights   10/31/23 1353  Weight: 151 lb 4.8 oz (68.6 kg)    Physical Exam   (exam performed in the presence of a chaperone)  LABORATORY DATA:  I have reviewed the data as listed    Latest Ref Rng & Units 12/17/2022   12:44 PM 11/02/2022    9:55 AM 08/21/2022   12:46 PM  CMP  Glucose 70 - 99 mg/dL 90  56  73   BUN 8 - 23 mg/dL 12  16  14    Creatinine 0.44 - 1.00 mg/dL 6.96  2.95  2.84   Sodium 135 - 145 mmol/L 137  141  139   Potassium 3.5 - 5.1 mmol/L  3.8  3.5  4.2   Chloride 98 - 111 mmol/L 102  106  102   CO2 22 - 32 mmol/L 29  28  31    Calcium 8.9 - 10.3 mg/dL 9.4  9.3  9.4   Total Protein 6.5 - 8.1 g/dL 7.0  7.6  7.9   Total Bilirubin 0.3 - 1.2 mg/dL 0.5  0.5  0.5   Alkaline Phos 38 - 126 U/L 83  90  92   AST 15 - 41 U/L 33  31  39   ALT 0 - 44 U/L 20  19  24      Lab Results  Component Value Date   WBC 3.3 (L) 10/31/2023   HGB 13.0 10/31/2023   HCT 38.0 10/31/2023   MCV 92.7 10/31/2023   PLT 161 10/31/2023   NEUTROABS 1.8 10/31/2023    ASSESSMENT & PLAN:  Malignant neoplasm of overlapping sites of right breast in female, estrogen receptor positive (HCC) 06/21/2020:Patient palpated a right breast mass x 1 wk. Mammogram showed  multiple confluent masses in the right breast at the 7 o'clock position measuring 2.3cm and at the 9 o'clock position measuring 2.2cm with one right axillary lymph node with cortical thickening. Biopsy showed invasive mammary carcinoma at both positions and in the axilla, grade 2-3, HER-2 equivocal by IHC (2+), positive by FISH (ratio 3.34), ER+ >95%, PR+ 25%, Ki67 60%.   Treatment plan: 1. Neoadjuvant chemotherapy with [facility] Perjeta  6 cycles completed 10/26/2020 followed by Herceptin  Perjeta  versus Kadcyla  maintenance for 1 year completed 05/23/2021 2.  Right mastectomy Delane Fear): IDC, grade 3, spanning 4.0cm fibrotic area, clear margins, 1/4 right axillary lymph nodes positive for carcinoma, 0.5cm. Left lumpectomy: no evidence of malignancy 3. Followed by adjuvant radiation therapy completed 02/17/2021 4.  Followed by adjuvant antiestrogen therapy started 12/29/2020 5.  Followed by neratinib  (started 12/28/2021, completed August 2024) ---------------------------------------------------------------------------------------------------------------------------------- Current Treatment:letrozole     Letrozole  toxicities: Tolerating it extremely well.    Surveillance:  1. Mammograms 04/26/2023 at [facility]: Benign Bone density: November 2022: T score -2.4:  2. Breast MRI 07/17/2023: No MRI evidence of malignancy  3.  Bone scan 07/16/2023: Negative 4.  CT CAP 07/08/2023: No evidence of metastatic disease   Positive Guardant Reveal: 04/24/2023: 0.09%   Repeat Guardant reveal and 360 testing in July 2025 after she returns back from [place] History of hydronephrosis: Follows with urology patient is not interested in doing another CT scan because of risks of radiation.  She will discuss with urology if an ultrasound would be adequate. ------------------------------------- Assessment and Plan Assessment & Plan Hydronephrosis Chronic mild hydronephrosis confirmed by recent CT. Discussed radiation risks and  considered ultrasound as an alternative. Bladder irritability considered for urinary frequency. Addressed concerns about CT necessity and potential spontaneous resolution. - Consider ultrasound for hydronephrosis stability. - Perform urinary function test. - Consult urology on CT necessity versus ultrasound. - If CT needed, use low dose and premedicate with Benadryl  and prednisone  for contrast allergy. - Encourage bladder training exercises.      No orders of the defined types were placed in this encounter.  The patient has a good understanding of the overall plan. she agrees with it. she will call with any problems that may develop before the next visit here. Total time spent: 30 mins including face to face time and time spent for planning, charting and co-ordination of care   Margert Sheerer, MD 10/31/23

## 2023-11-04 ENCOUNTER — Ambulatory Visit: Payer: BC Managed Care – PPO | Admitting: Hematology and Oncology

## 2023-11-04 ENCOUNTER — Other Ambulatory Visit: Payer: BC Managed Care – PPO

## 2023-11-06 DIAGNOSIS — M25551 Pain in right hip: Secondary | ICD-10-CM | POA: Diagnosis not present

## 2023-11-06 DIAGNOSIS — N130 Hydronephrosis with ureteropelvic junction obstruction: Secondary | ICD-10-CM | POA: Diagnosis not present

## 2023-11-07 ENCOUNTER — Other Ambulatory Visit

## 2023-11-07 ENCOUNTER — Ambulatory Visit: Admitting: Hematology and Oncology

## 2023-11-07 DIAGNOSIS — C50911 Malignant neoplasm of unspecified site of right female breast: Secondary | ICD-10-CM | POA: Diagnosis not present

## 2023-11-07 DIAGNOSIS — C50912 Malignant neoplasm of unspecified site of left female breast: Secondary | ICD-10-CM | POA: Diagnosis not present

## 2023-11-08 DIAGNOSIS — D696 Thrombocytopenia, unspecified: Secondary | ICD-10-CM | POA: Diagnosis not present

## 2023-11-08 DIAGNOSIS — N3020 Other chronic cystitis without hematuria: Secondary | ICD-10-CM | POA: Diagnosis not present

## 2023-11-08 DIAGNOSIS — C50919 Malignant neoplasm of unspecified site of unspecified female breast: Secondary | ICD-10-CM | POA: Diagnosis not present

## 2023-11-08 DIAGNOSIS — Z0000 Encounter for general adult medical examination without abnormal findings: Secondary | ICD-10-CM | POA: Diagnosis not present

## 2023-11-13 DIAGNOSIS — M25551 Pain in right hip: Secondary | ICD-10-CM | POA: Diagnosis not present

## 2023-11-20 DIAGNOSIS — M25551 Pain in right hip: Secondary | ICD-10-CM | POA: Diagnosis not present

## 2023-11-27 DIAGNOSIS — M25551 Pain in right hip: Secondary | ICD-10-CM | POA: Diagnosis not present

## 2023-11-29 DIAGNOSIS — C50911 Malignant neoplasm of unspecified site of right female breast: Secondary | ICD-10-CM | POA: Diagnosis not present

## 2023-11-29 DIAGNOSIS — C50912 Malignant neoplasm of unspecified site of left female breast: Secondary | ICD-10-CM | POA: Diagnosis not present

## 2023-12-09 DIAGNOSIS — Z0000 Encounter for general adult medical examination without abnormal findings: Secondary | ICD-10-CM | POA: Diagnosis not present

## 2023-12-09 DIAGNOSIS — Z8639 Personal history of other endocrine, nutritional and metabolic disease: Secondary | ICD-10-CM | POA: Diagnosis not present

## 2023-12-09 DIAGNOSIS — M205X2 Other deformities of toe(s) (acquired), left foot: Secondary | ICD-10-CM | POA: Diagnosis not present

## 2023-12-09 DIAGNOSIS — L565 Disseminated superficial actinic porokeratosis (DSAP): Secondary | ICD-10-CM | POA: Diagnosis not present

## 2024-01-09 ENCOUNTER — Encounter: Payer: Self-pay | Admitting: Hematology and Oncology

## 2024-01-14 ENCOUNTER — Inpatient Hospital Stay: Payer: BC Managed Care – PPO | Admitting: Hematology and Oncology

## 2024-01-14 DIAGNOSIS — C50811 Malignant neoplasm of overlapping sites of right female breast: Secondary | ICD-10-CM | POA: Diagnosis not present

## 2024-01-14 DIAGNOSIS — Z170 Estrogen receptor positive status [ER+]: Secondary | ICD-10-CM | POA: Diagnosis not present

## 2024-01-22 DIAGNOSIS — M810 Age-related osteoporosis without current pathological fracture: Secondary | ICD-10-CM | POA: Diagnosis not present

## 2024-01-22 DIAGNOSIS — C50919 Malignant neoplasm of unspecified site of unspecified female breast: Secondary | ICD-10-CM | POA: Diagnosis not present

## 2024-01-22 DIAGNOSIS — Z170 Estrogen receptor positive status [ER+]: Secondary | ICD-10-CM | POA: Diagnosis not present

## 2024-02-06 ENCOUNTER — Other Ambulatory Visit

## 2024-02-14 ENCOUNTER — Other Ambulatory Visit: Payer: Self-pay | Admitting: Hematology and Oncology

## 2024-02-14 NOTE — Telephone Encounter (Signed)
 Saw Dr. Gudena 10/2023 and will see again 04/2024

## 2024-02-26 ENCOUNTER — Encounter: Payer: Self-pay | Admitting: Hematology and Oncology

## 2024-02-27 DIAGNOSIS — D2239 Melanocytic nevi of other parts of face: Secondary | ICD-10-CM | POA: Diagnosis not present

## 2024-02-27 DIAGNOSIS — D225 Melanocytic nevi of trunk: Secondary | ICD-10-CM | POA: Diagnosis not present

## 2024-02-27 DIAGNOSIS — L821 Other seborrheic keratosis: Secondary | ICD-10-CM | POA: Diagnosis not present

## 2024-03-03 DIAGNOSIS — N130 Hydronephrosis with ureteropelvic junction obstruction: Secondary | ICD-10-CM | POA: Diagnosis not present

## 2024-03-03 DIAGNOSIS — R3914 Feeling of incomplete bladder emptying: Secondary | ICD-10-CM | POA: Diagnosis not present

## 2024-03-03 DIAGNOSIS — R350 Frequency of micturition: Secondary | ICD-10-CM | POA: Diagnosis not present

## 2024-03-03 DIAGNOSIS — R351 Nocturia: Secondary | ICD-10-CM | POA: Diagnosis not present

## 2024-03-03 DIAGNOSIS — R8271 Bacteriuria: Secondary | ICD-10-CM | POA: Diagnosis not present

## 2024-03-06 DIAGNOSIS — M810 Age-related osteoporosis without current pathological fracture: Secondary | ICD-10-CM | POA: Diagnosis not present

## 2024-03-13 ENCOUNTER — Encounter: Payer: Self-pay | Admitting: Hematology and Oncology

## 2024-03-24 ENCOUNTER — Encounter: Payer: Self-pay | Admitting: Hematology and Oncology

## 2024-03-24 ENCOUNTER — Other Ambulatory Visit: Payer: Self-pay | Admitting: Hematology and Oncology

## 2024-03-24 DIAGNOSIS — Z1231 Encounter for screening mammogram for malignant neoplasm of breast: Secondary | ICD-10-CM

## 2024-03-30 ENCOUNTER — Encounter: Payer: Self-pay | Admitting: Hematology and Oncology

## 2024-03-30 ENCOUNTER — Encounter (INDEPENDENT_AMBULATORY_CARE_PROVIDER_SITE_OTHER): Payer: Self-pay

## 2024-04-03 ENCOUNTER — Ambulatory Visit
Admission: RE | Admit: 2024-04-03 | Discharge: 2024-04-03 | Disposition: A | Discharge status: Home / Self Care | Source: Ambulatory Visit | Attending: Hematology and Oncology | Admitting: Hematology and Oncology

## 2024-04-03 DIAGNOSIS — Z1231 Encounter for screening mammogram for malignant neoplasm of breast: Secondary | ICD-10-CM

## 2024-04-07 ENCOUNTER — Ambulatory Visit: Admitting: Hematology and Oncology

## 2024-04-07 ENCOUNTER — Other Ambulatory Visit

## 2024-04-08 ENCOUNTER — Encounter: Payer: Self-pay | Admitting: Hematology and Oncology

## 2024-04-10 ENCOUNTER — Inpatient Hospital Stay: Attending: Hematology and Oncology

## 2024-04-10 ENCOUNTER — Inpatient Hospital Stay

## 2024-04-10 DIAGNOSIS — Z170 Estrogen receptor positive status [ER+]: Secondary | ICD-10-CM | POA: Diagnosis not present

## 2024-04-10 DIAGNOSIS — C50811 Malignant neoplasm of overlapping sites of right female breast: Secondary | ICD-10-CM | POA: Diagnosis present

## 2024-04-10 DIAGNOSIS — Z79811 Long term (current) use of aromatase inhibitors: Secondary | ICD-10-CM | POA: Insufficient documentation

## 2024-04-10 LAB — CMP (CANCER CENTER ONLY)
ALT: 15 U/L (ref 0–44)
AST: 26 U/L (ref 15–41)
Albumin: 4.5 g/dL (ref 3.5–5.0)
Alkaline Phosphatase: 52 U/L (ref 38–126)
Anion gap: 4 — ABNORMAL LOW (ref 5–15)
BUN: 18 mg/dL (ref 8–23)
CO2: 30 mmol/L (ref 22–32)
Calcium: 9.6 mg/dL (ref 8.9–10.3)
Chloride: 105 mmol/L (ref 98–111)
Creatinine: 0.9 mg/dL (ref 0.44–1.00)
GFR, Estimated: >60 mL/min (ref >60)
Glucose, Bld: 89 mg/dL (ref 70–99)
Potassium: 4 mmol/L (ref 3.5–5.1)
Sodium: 139 mmol/L (ref 135–145)
Total Bilirubin: 0.6 mg/dL (ref 0.0–1.2)
Total Protein: 7.6 g/dL (ref 6.5–8.1)

## 2024-04-10 LAB — CBC WITH DIFFERENTIAL (CANCER CENTER ONLY)
Abs Immature Granulocytes: 0.01 K/uL (ref 0.00–0.07)
Basophils Absolute: 0 K/uL (ref 0.0–0.1)
Basophils Relative: 1 %
Eosinophils Absolute: 0.1 K/uL (ref 0.0–0.5)
Eosinophils Relative: 4 %
HCT: 39 % (ref 36.0–46.0)
Hemoglobin: 13.1 g/dL (ref 12.0–15.0)
Immature Granulocytes: 0 %
Lymphocytes Relative: 29 %
Lymphs Abs: 0.9 K/uL (ref 0.7–4.0)
MCH: 31.6 pg (ref 26.0–34.0)
MCHC: 33.6 g/dL (ref 30.0–36.0)
MCV: 94.2 fL (ref 80.0–100.0)
Monocytes Absolute: 0.3 K/uL (ref 0.1–1.0)
Monocytes Relative: 10 %
Neutro Abs: 1.7 K/uL (ref 1.7–7.7)
Neutrophils Relative %: 56 %
Platelet Count: 150 K/uL (ref 150–400)
RBC: 4.14 MIL/uL (ref 3.87–5.11)
RDW: 11.7 % (ref 11.5–15.5)
WBC Count: 3.1 K/uL — ABNORMAL LOW (ref 4.0–10.5)
nRBC: 0 % (ref 0.0–0.2)

## 2024-04-14 ENCOUNTER — Ambulatory Visit: Admitting: Hematology and Oncology

## 2024-04-15 ENCOUNTER — Other Ambulatory Visit

## 2024-04-15 ENCOUNTER — Ambulatory Visit: Admitting: Hematology and Oncology

## 2024-04-22 ENCOUNTER — Inpatient Hospital Stay: Admitting: Hematology and Oncology

## 2024-04-22 VITALS — BP 114/60 | HR 57 | Temp 98.1°F | Resp 18 | Ht 70.0 in | Wt 151.4 lb

## 2024-04-22 DIAGNOSIS — Z170 Estrogen receptor positive status [ER+]: Secondary | ICD-10-CM

## 2024-04-22 DIAGNOSIS — C50811 Malignant neoplasm of overlapping sites of right female breast: Secondary | ICD-10-CM

## 2024-04-22 NOTE — Progress Notes (Signed)
 Patient Care Team: Ransom Other, MD as PCP - General (Internal Medicine) Ebbie Cough, MD as Consulting Physician (General Surgery) Odean Potts, MD as Medical Oncologist (Hematology and Oncology) Dewey Rush, MD as Consulting Physician (Radiation Oncology) Tommas Pears, MD as Referring Physician (Endocrinology) Melodye Clarita BRAVO, MD as Referring Physician (Endocrinology) Burnette Fallow, MD as Consulting Physician (Gastroenterology) Lucila Rush LABOR, RPH-CPP as Pharmacist (Hematology and Oncology)  DIAGNOSIS:  Encounter Diagnosis  Name Primary?   Malignant neoplasm of overlapping sites of right breast in female, estrogen receptor positive (HCC) Yes    SUMMARY OF ONCOLOGIC HISTORY: Oncology History  Malignant neoplasm of overlapping sites of right breast in female, estrogen receptor positive (HCC)  06/21/2020 Initial Diagnosis   Patient palpated a right breast mass x 1 wk. Mammogram showed multiple confluent masses in the right breast at the 7 o'clock position measuring 2.3cm and at the 9 o'clock position measuring 2.2cm with one right axillary lymph node with cortical thickening. Biopsy showed invasive mammary carcinoma at both positions and in the axilla, grade 2-3, HER-2 equivocal by IHC (2+), positive by FISH (ratio 3.34), ER+ >95%, PR+ 25%, Ki67 60%.   06/29/2020 Cancer Staging   Staging form: Breast, AJCC 8th Edition - Clinical stage from 06/29/2020: Stage IB (cT2, cN1(f), cM0, G3, ER+, PR+, HER2+) - Signed by Odean Potts, MD on 06/29/2020   07/13/2020 - 11/16/2020 Neo-Adjuvant Chemotherapy   Taxotere , Carbo, Herceptin , Perjeta  x 6 followed by 1 cycle of herceptin /perjeta    10/28/2020 Breast MRI   Complete imaging response   11/14/2020 Genetic Testing   Negative hereditary cancer genetic testing: no pathogenic variants detected in [facility] BRCAPlus Panel and [facility] CancerNext-Expanded +RNAinsight Panel.  The report dates are Nov 14, 2020 and December 05, 2020, respectively.     The BRCAplus panel offered by [employer] and includes sequencing and deletion/duplication analysis for the following 8 genes: ATM, BRCA1, BRCA2, CDH1, CHEK2, PALB2, PTEN, and TP53.  The CancerNext-Expanded gene panel offered by [facility] and includes sequencing, rearrangement, and RNA analysis for the following 77 genes: AIP, ALK, APC, ATM, AXIN2, BAP1, BARD1, BLM, BMPR1A, BRCA1, BRCA2, BRIP1, CDC73, CDH1, CDK4, CDKN1B, CDKN2A, CHEK2, CTNNA1, DICER1, FANCC, FH, FLCN, GALNT12, KIF1B, LZTR1, MAX, MEN1, MET, MLH1, MSH2, MSH3, MSH6, MUTYH, NBN, NF1, NF2, NTHL1, PALB2, PHOX2B, PMS2, POT1, PRKAR1A, PTCH1, PTEN, RAD51C, RAD51D, RB1, RECQL, RET, SDHA, SDHAF2, SDHB, SDHC, SDHD, SMAD4, SMARCA4, SMARCB1, SMARCE1, STK11, SUFU, TMEM127, TP53, TSC1, TSC2, VHL and XRCC2 (sequencing and deletion/duplication); EGFR, EGLN1, HOXB13, KIT, MITF, PDGFRA, POLD1, and POLE (sequencing only); EPCAM and GREM1 (deletion/duplication only).    11/28/2020 Surgery   Right mastectomy Viktoria): IDC, grade 3, spanning 4.0cm fibrotic area, clear margins, 1/4 right axillary lymph nodes positive for carcinoma, 0.5cm.   Left lumpectomy: no evidence of malignancy   11/28/2020 Cancer Staging   Staging form: Breast, AJCC 8th Edition - Pathologic stage from 11/28/2020: No Stage Recommended (ypT2, pN1a, cM0) - Signed by Crawford Morna Pickle, NP on 07/26/2021 Stage prefix: Post-therapy   12/07/2020 - 05/23/2021 Chemotherapy   Patient is on Treatment Plan : BREAST ADO-Trastuzumab Emtansine  (Kadcyla ) q21d     01/04/2021 - 02/17/2021 Radiation Therapy   Site Technique Total Dose (Gy) Dose per Fx (Gy) Completed Fx Beam Energies  Chest Wall, Right: CW_Rt 3D 50.4/50.4 1.8 28/28 6X  Chest Wall, Right: CW_Rt_SCLV 3D 50.4/50.4 1.8 28/28 6X, 10X  Chest Wall, Right: CW_Rt_Bst Electron 10/10 2 5/5 6E     12/2020 -  Anti-estrogen oral therapy   Letrozole  daily  CHIEF COMPLIANT: Follow-up on letrozole  therapy  HISTORY OF PRESENT  ILLNESS:   History of Present Illness Teresa Sheppard is a 63 year old female who presents for follow-up of history of breast cancer on letrozole  therapy with gastrointestinal symptoms and recent cold.  She experiences epigastric discomfort following a course of nitrofurantoin  for a urinary tract infection last September. The discomfort began after completing the antibiotic treatment. She started probiotics with some improvement but occasionally forgets to take them. She also notes soreness in the epigastric area, which she attributes to a new exercise routine. She is taking a calcium product with vitamin D .  There is concern about her gastrointestinal symptoms due to a family history of pancreatic cancer, as her grandfather and father had the condition. Her discomfort is localized to the epigastric area, with no significant back pain.     ALLERGIES:  is allergic to shellfish protein-containing drug products, ciprofibrate, propylthiouracil, shellfish allergy, shrimp extract, hydrocodone, and penicillins.  MEDICATIONS:  Current Outpatient Medications  Medication Sig Dispense Refill   acetaminophen  (TYLENOL ) 325 MG tablet Take 325 mg by mouth every 6 (six) hours as needed (headaches / pain). (Patient not taking: Reported on 12/17/2022)     anastrozole  (ARIMIDEX ) 1 MG tablet TAKE 1 TABLET(1 MG) BY MOUTH DAILY 90 tablet 1   Calcium-Vitamin D -Vitamin K ([employer] CALCIUM SOFT CHEWS) 650-12.5-40 MG-MCG-MCG CHEW Chew 1 tablet by mouth 2 (two) times daily.     cholecalciferol (VITAMIN D3) 25 MCG (1000 UNIT) tablet Take 2,000 Units by mouth daily.     D-Mannose 500 MG CAPS Take 500 mg by mouth in the morning and at bedtime.     estradiol  (ESTRACE ) 0.1 MG/GM vaginal cream INSERT 1 APPLICATORFUL VAGINALLY AT BEDTIME 42.5 g 12   Lactobacillus-Inulin (CULTURELLE ADULT ULT BALANCE PO) Take by mouth daily.     Polyethyl Glycol-Propyl Glycol (SYSTANE OP) Place 1 drop into both eyes daily as needed (dry eyes).      predniSONE  (DELTASONE ) 50 MG tablet Take one tablet 13 hours prior to CT, one tablet 7 hours prior to CT, one tablet 1 hour prior to CT (Patient not taking: Reported on 10/31/2023) 3 tablet 0   vitamin E 28000 units external oil Apply topically daily. Vaginally 2-3 times per week     No current facility-administered medications for this visit.    PHYSICAL EXAMINATION: ECOG PERFORMANCE STATUS: 1 - Symptomatic but completely ambulatory  Vitals:   04/22/24 1356  BP: 114/60  Pulse: (!) 57  Resp: 18  Temp: 98.1 F (36.7 C)  SpO2: 100%   Filed Weights   04/22/24 1356  Weight: 151 lb 6.4 oz (68.7 kg)     LABORATORY DATA:  I have reviewed the data as listed    Latest Ref Rng & Units 04/10/2024    9:24 AM 10/31/2023    1:33 PM 12/17/2022   12:44 PM  CMP  Glucose 70 - 99 mg/dL 89  78  90   BUN 8 - 23 mg/dL 18  21  12    Creatinine 0.44 - 1.00 mg/dL 9.09  9.17  9.07   Sodium 135 - 145 mmol/L 139  140  137   Potassium 3.5 - 5.1 mmol/L 4.0  4.3  3.8   Chloride 98 - 111 mmol/L 105  106  102   CO2 22 - 32 mmol/L 30  30  29    Calcium 8.9 - 10.3 mg/dL 9.6  9.2  9.4   Total Protein 6.5 - 8.1 g/dL 7.6  7.4  7.0   Total Bilirubin 0.0 - 1.2 mg/dL 0.6  0.4  0.5   Alkaline Phos 38 - 126 U/L 52  55  83   AST 15 - 41 U/L 26  25  33   ALT 0 - 44 U/L 15  16  20      Lab Results  Component Value Date   WBC 3.1 (L) 04/10/2024   HGB 13.1 04/10/2024   HCT 39.0 04/10/2024   MCV 94.2 04/10/2024   PLT 150 04/10/2024   NEUTROABS 1.7 04/10/2024    ASSESSMENT & PLAN:  Malignant neoplasm of overlapping sites of right breast in female, estrogen receptor positive (HCC) 06/21/2020:Patient palpated a right breast mass x 1 wk. Mammogram showed multiple confluent masses in the right breast at the 7 o'clock position measuring 2.3cm and at the 9 o'clock position measuring 2.2cm with one right axillary lymph node with cortical thickening. Biopsy showed invasive mammary carcinoma at both positions and in the  axilla, grade 2-3, HER-2 equivocal by IHC (2+), positive by FISH (ratio 3.34), ER+ >95%, PR+ 25%, Ki67 60%.   Treatment plan: 1. Neoadjuvant chemotherapy with [facility] Perjeta  6 cycles completed 10/26/2020 followed by Herceptin  Perjeta  versus Kadcyla  maintenance for 1 year completed 05/23/2021 2.  Right mastectomy Viktoria): IDC, grade 3, spanning 4.0cm fibrotic area, clear margins, 1/4 right axillary lymph nodes positive for carcinoma, 0.5cm. Left lumpectomy: no evidence of malignancy 3. Followed by adjuvant radiation therapy completed 02/17/2021 4.  Followed by adjuvant antiestrogen therapy started 12/29/2020 5.  Followed by neratinib  (started 12/28/2021, completed August 2024) ---------------------------------------------------------------------------------------------------------------------------------- Current Treatment:letrozole     Letrozole  toxicities: Tolerating it extremely well.    Surveillance:  1. Mammograms 04/07/2024: Benign, breast density category C Bone density: November 2022: T score -2.4:  2. Breast MRI 07/17/2023: No MRI evidence of malignancy  3.  Bone scan 07/16/2023: Negative 4.  CT CAP 07/08/2023: No evidence of metastatic disease   Positive Guardant Reveal:  04/24/2023: 0.09% 03/30/2024: 0%   Repeat Guardant reveal  History of hydronephrosis: Follows with urology patient is not interested in doing another CT scan because of risks of radiation.  She will discuss with urology if an ultrasound would be adequate.  Follow-up in 6 months   No orders of the defined types were placed in this encounter.  The patient has a good understanding of the overall plan. she agrees with it. she will call with any problems that may develop before the next visit here.  I personally spent a total of 30 minutes in the care of the patient today including preparing to see the patient, getting/reviewing separately obtained history, performing a medically appropriate exam/evaluation, counseling  and educating, placing orders, referring and communicating with other health care professionals, documenting clinical information in the EHR, independently interpreting results, communicating results, and coordinating care.   Viinay K Di Jasmer, MD 04/22/24

## 2024-04-22 NOTE — Assessment & Plan Note (Signed)
 06/21/2020:Patient palpated a right breast mass x 1 wk. Mammogram showed multiple confluent masses in the right breast at the 7 o'clock position measuring 2.3cm and at the 9 o'clock position measuring 2.2cm with one right axillary lymph node with cortical thickening. Biopsy showed invasive mammary carcinoma at both positions and in the axilla, grade 2-3, HER-2 equivocal by IHC (2+), positive by FISH (ratio 3.34), ER+ >95%, PR+ 25%, Ki67 60%.   Treatment plan: 1. Neoadjuvant chemotherapy with [facility] Perjeta  6 cycles completed 10/26/2020 followed by Herceptin  Perjeta  versus Kadcyla  maintenance for 1 year completed 05/23/2021 2.  Right mastectomy Teresa Sheppard): IDC, grade 3, spanning 4.0cm fibrotic area, clear margins, 1/4 right axillary lymph nodes positive for carcinoma, 0.5cm. Left lumpectomy: no evidence of malignancy 3. Followed by adjuvant radiation therapy completed 02/17/2021 4.  Followed by adjuvant antiestrogen therapy started 12/29/2020 5.  Followed by neratinib  (started 12/28/2021, completed August 2024) ---------------------------------------------------------------------------------------------------------------------------------- Current Treatment:letrozole     Letrozole  toxicities: Tolerating it extremely well.    Surveillance:  1. Mammograms 04/26/2023 at [facility]: Benign Bone density: November 2022: T score -2.4:  2. Breast MRI 07/17/2023: No MRI evidence of malignancy  3.  Bone scan 07/16/2023: Negative 4.  CT CAP 07/08/2023: No evidence of metastatic disease   Positive Guardant Reveal:  04/24/2023: 0.09% 03/30/2024: 0%   Repeat Guardant reveal and 360 testing in July 2025 after she returns back from [place] History of hydronephrosis: Follows with urology patient is not interested in doing another CT scan because of risks of radiation.  She will discuss with urology if an ultrasound would be adequate.

## 2024-05-14 ENCOUNTER — Ambulatory Visit: Admitting: Hematology and Oncology

## 2024-05-14 ENCOUNTER — Inpatient Hospital Stay

## 2024-06-01 DIAGNOSIS — M810 Age-related osteoporosis without current pathological fracture: Secondary | ICD-10-CM | POA: Diagnosis not present

## 2024-06-01 DIAGNOSIS — H9190 Unspecified hearing loss, unspecified ear: Secondary | ICD-10-CM | POA: Diagnosis not present

## 2024-06-02 ENCOUNTER — Encounter: Payer: Self-pay | Admitting: Hematology and Oncology

## 2024-06-04 ENCOUNTER — Encounter: Payer: Self-pay | Admitting: Audiologist

## 2024-06-04 ENCOUNTER — Ambulatory Visit: Admitting: Audiologist

## 2024-06-04 DIAGNOSIS — H9193 Unspecified hearing loss, bilateral: Secondary | ICD-10-CM

## 2024-06-04 NOTE — Procedures (Signed)
°  Outpatient Audiology and [facility] [address] [place], [place]  [address] [phone]  AUDIOLOGICAL  EVALUATION  NAME: Teresa Sheppard     DOB:   07/09/1960      MRN: [id number]                                                                                     DATE: 06/04/2024     REFERENT: Ransom Other, MD STATUS: Outpatient DIAGNOSIS: Decreased Hearing    History: Teresa Sheppard was seen for an audiological evaluation due to difficulty hearing in noise.  Teresa Sheppard denies pain, pressure, or tinnitus.  Teresa Sheppard has no history of hazardous noise exposure.  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   Right ear feels clogged.    Evaluation:  Otoscopy showed a clear view of the tympanic membranes, bilaterally Tympanometry results were consistent with normal middle ear function, bilaterally   Audiometric testing was completed using Conventional Audiometry techniques with insert earphones and supraural headphones. Test results are consistent with normal hearing 250-6kHz to 30-35dB at [facility]. Speech Recognition Thresholds were obtained at 15dB HL in the right ear and at 20dB HL in the left ear. Word Recognition Testing was completed at  40dB SL and Teresa Sheppard scored 100% in each ear.  QuickSIN less than 3.0dB SNR showing normal hearing in noise.   Results:  The test results were reviewed with Teresa Sheppard. Thresholds are in normal range for all frequencies of speech. She has normal hearing in background noise. Difficulty may be due to fatigue.   Recommendations: No further testing is recommended at this time. If increased hearing difficulties are observed further audiological testing is recommended.           28 minutes spent testing and counseling on results.   If you have any questions please feel free to contact me at [phone].  Lauraine Ka Stalnaker Au.D.  Audiologist   06/04/2024  3:20 PM  Cc: Husain, Karrar, MD

## 2024-06-05 DIAGNOSIS — N390 Urinary tract infection, site not specified: Secondary | ICD-10-CM | POA: Diagnosis not present
